# Patient Record
Sex: Male | Born: 1965 | Race: White | Hispanic: No | Marital: Married | Smoking: Never smoker
Health system: Southern US, Community
[De-identification: ages and names within clinical notes are randomized; demographics above are authoritative.]

## PROBLEM LIST (undated history)

## (undated) DIAGNOSIS — N189 Chronic kidney disease, unspecified: Secondary | ICD-10-CM

## (undated) DIAGNOSIS — I350 Nonrheumatic aortic (valve) stenosis: Secondary | ICD-10-CM

## (undated) DIAGNOSIS — C801 Malignant (primary) neoplasm, unspecified: Secondary | ICD-10-CM

## (undated) DIAGNOSIS — Z5189 Encounter for other specified aftercare: Secondary | ICD-10-CM

## (undated) DIAGNOSIS — Z8249 Family history of ischemic heart disease and other diseases of the circulatory system: Secondary | ICD-10-CM

## (undated) DIAGNOSIS — E119 Type 2 diabetes mellitus without complications: Secondary | ICD-10-CM

## (undated) DIAGNOSIS — I10 Essential (primary) hypertension: Secondary | ICD-10-CM

## (undated) DIAGNOSIS — E785 Hyperlipidemia, unspecified: Secondary | ICD-10-CM

## (undated) DIAGNOSIS — M1990 Unspecified osteoarthritis, unspecified site: Secondary | ICD-10-CM

## (undated) DIAGNOSIS — T7840XA Allergy, unspecified, initial encounter: Secondary | ICD-10-CM

## (undated) DIAGNOSIS — E669 Obesity, unspecified: Secondary | ICD-10-CM

## (undated) HISTORY — DX: Obesity, unspecified: E66.9

## (undated) HISTORY — DX: Hyperlipidemia, unspecified: E78.5

## (undated) HISTORY — DX: Essential (primary) hypertension: I10

## (undated) HISTORY — PX: WISDOM TOOTH EXTRACTION: SHX21

## (undated) HISTORY — DX: Allergy, unspecified, initial encounter: T78.40XA

## (undated) HISTORY — DX: Type 2 diabetes mellitus without complications: E11.9

## (undated) HISTORY — DX: Encounter for other specified aftercare: Z51.89

## (undated) HISTORY — PX: TONSILLECTOMY: SUR1361

---

## 2016-09-19 ENCOUNTER — Encounter: Payer: Self-pay | Admitting: *Deleted

## 2016-09-19 DIAGNOSIS — E119 Type 2 diabetes mellitus without complications: Secondary | ICD-10-CM | POA: Insufficient documentation

## 2016-09-27 ENCOUNTER — Encounter: Payer: Self-pay | Admitting: Internal Medicine

## 2016-10-05 ENCOUNTER — Ambulatory Visit (INDEPENDENT_AMBULATORY_CARE_PROVIDER_SITE_OTHER): Payer: 59 | Admitting: Cardiology

## 2016-10-05 ENCOUNTER — Encounter (INDEPENDENT_AMBULATORY_CARE_PROVIDER_SITE_OTHER): Payer: Self-pay

## 2016-10-05 ENCOUNTER — Encounter: Payer: Self-pay | Admitting: Cardiology

## 2016-10-05 VITALS — BP 122/80 | HR 86 | Ht 70.0 in | Wt 268.1 lb

## 2016-10-05 DIAGNOSIS — Z8249 Family history of ischemic heart disease and other diseases of the circulatory system: Secondary | ICD-10-CM | POA: Diagnosis not present

## 2016-10-05 DIAGNOSIS — E119 Type 2 diabetes mellitus without complications: Secondary | ICD-10-CM | POA: Diagnosis not present

## 2016-10-05 NOTE — Progress Notes (Signed)
Cardiology Office Note    Date:  10/05/2016   ID:  Russell Burns, DOB [birth year], MRN [id number]  PCP:  Russell Reel, MD  Cardiologist:   Candee Furbish, MD     History of Present Illness:  Russell Burns is a 51 y.o. male with prior extensive cardiac evaluation in 2005 in [place] [place] [facility] here for further cardiac evaluation with family history of CAD.  DM, strong FHX of CAD. No CP no SOB.   A1c <7. No smoking. Chantix to help with snuff.   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 Glucose was 560.   Past Medical History:  Diagnosis Date  . Diabetes mellitus without complication ([place])   . Hyperlipidemia   . Hypertension   . Obesity     Past Surgical History:  Procedure Laterality Date  . WISDOM TOOTH EXTRACTION      Current Medications: Outpatient Medications Prior to Visit  Medication Sig Dispense Refill  . aspirin 81 MG chewable tablet Chew 81 mg by mouth daily.    Marland Kitchen atorvastatin (LIPITOR) 80 MG tablet Take 80 mg by mouth daily.    . canagliflozin (INVOKANA) 300 MG TABS tablet Take 300 mg by mouth daily before breakfast.    . cetirizine (ZYRTEC) 10 MG tablet Take 10 mg by mouth daily as needed for allergies.    . Dulaglutide (TRULICITY) [id number] [id number] SOPN Inject into the skin. Use as directed    . EPINEPHrine (EPIPEN 2-PAK) 0.3 mg/0.3 mL IJ SOAJ injection Inject 0.3 mg into the muscle as needed.    Marland Kitchen glimepiride (AMARYL) 1 MG tablet  Take 1 mg by mouth daily with breakfast.    . losartan (COZAAR) 100 MG tablet Take 100 mg by mouth daily.    . metFORMIN (GLUCOPHAGE) 1000 MG tablet Take 1,000 mg by mouth 2 (two) times daily with a meal.    . sildenafil (VIAGRA) 100 MG tablet Take 100 mg by mouth daily as needed for erectile dysfunction.    . varenicline (CHANTIX) 1 MG tablet Take 1 mg by mouth 2 (two) times daily.     No facility-administered medications prior to visit.      Allergies:   Bee venom   Social History   Social History  . Marital status: Married    Spouse name: N/A  . Number of children: 2  . Years of education: N/A   Social History Main Topics  . Smoking status: Never Smoker  . Smokeless tobacco: Former Systems developer    Types: Chew  . Alcohol use Yes  . Drug use: No  . Sexual activity: Yes   Other Topics Concern  . None   Social History Narrative  . None     Family History:  The patient's family history includes Aneurysm in his maternal grandfather and maternal uncle; Aneurysm (age of onset: 80) in his cousin; CAD in his other; Diabetes Mellitus II in his father  and other; Heart attack in his father; Heart attack (age of onset: 16) in his paternal grandfather; Heart attack (age of onset: 32) in his paternal uncle; Heart attack (age of onset: 61) in his maternal grandmother; Heart failure in his other; Hypertension in his mother, other, and paternal uncle.   ROS:   Please see the history of present illness.    ROS All other systems reviewed and are negative.   PHYSICAL EXAM:   VS:  BP 122/80   Pulse 86   Ht 5\' 10"  (1.778 m)   Wt 268 lb 2 oz (121.6 kg)   BMI 38.47 kg/m    GEN: Well nourished, well developed, in no acute distress  HEENT: normal  Neck: no JVD, carotid bruits, or masses Cardiac: RRR; no murmurs, rubs, or gallops,no edema  Respiratory:  clear to auscultation bilaterally, normal work of breathing GI: soft, nontender, nondistended, + BS, overweight MS: no deformity or atrophy    Skin: warm and dry, no rash Neuro:  Alert and Oriented x 3, Strength and sensation are intact Psych: euthymic mood, full affect  Wt Readings from Last 3 Encounters:  10/05/16 268 lb 2 oz (121.6 kg)      Studies/Labs Reviewed:   EKG:  Prior EKG personally reviewed from 08/30/16 which shows sinus rhythm with poor R-wave progression heart rate 69 bpm.  Recent Labs: No results found for requested labs within last 8760 hours.   White count 5.5, platelets 172, hemoglobin 18.3, creatinine 0.8, potassium 5.6, ALT 47, total cholesterol 163, LDL 86, HDL 48, TSH 2.9  Lipid Panel No results found for: CHOL, TRIG, HDL, CHOLHDL, VLDL, LDLCALC, LDLDIRECT  Additional studies/ records that were reviewed today include:  Prior office notes reviewed, lab work reviewed, EKG reviewed    ASSESSMENT:    1. Family history of cardiovascular disease   2. Diabetes mellitus without complication ([place])   3. Morbid obesity ([place])      PLAN:  In order of problems listed above:  Strong family history of coronary artery disease  - Prior stress test in 2005 was reassuring, sounds like he had a stress echocardiogram done.  - With his EKG currently reassuring, I feel comfortable proceeding with exercise treadmill test. If abnormal, we'll proceed with nuclear imaging.  - #1 goal is to continue with primary prevention. For him, lifestyle modification, weight loss is very important. We discussed. Excellent control of his diabetes, coronary artery disease equivalent. Aspirin.  - Thankfully, he is currently not having any symptoms.  - We also discussed the possibility of calcium score however he is already on atorvastatin.  Diabetes mellitus  - Excellent care with Dr. Virgina Jock and his diabetic team.  Hyperlipidemia  - Atorvastatin 80 mg  Morbid obesity  - BMI 38 with diabetes  - Continue to encourage weight loss.   Medication Adjustments/Labs and Tests Ordered: Current medicines are reviewed at length with  the patient today.  Concerns regarding medicines are outlined above.  Medication changes, Labs and Tests ordered today are listed in the Patient Instructions below. Patient Instructions  Medication Instructions:  The current medical regimen is effective;  continue present plan and medications.  Testing/Procedures: Your physician has requested that you have an exercise tolerance test. For further information please visit [email or web address]. Please also follow instruction sheet, as given.  Follow-Up: Follow up as needed after testing.  If you need a refill on your cardiac medications before your next appointment, please call your pharmacy.  Thank you for choosing [facility]!!        Signed, Candee Furbish, MD  10/05/2016 12:12 PM    [address], [place], [place]  [address] Phone: [phone]; Fax: [phone]

## 2016-10-05 NOTE — Patient Instructions (Signed)
Medication Instructions:  The current medical regimen is effective;  continue present plan and medications.  Testing/Procedures: Your physician has requested that you have an exercise tolerance test. For further information please visit [email or web address]. Please also follow instruction sheet, as given.  Follow-Up: Follow up as needed after testing.  If you need a refill on your cardiac medications before your next appointment, please call your pharmacy.  Thank you for choosing [facility]!!     

## 2016-11-08 ENCOUNTER — Ambulatory Visit (INDEPENDENT_AMBULATORY_CARE_PROVIDER_SITE_OTHER): Payer: 59

## 2016-11-08 DIAGNOSIS — Z8249 Family history of ischemic heart disease and other diseases of the circulatory system: Secondary | ICD-10-CM

## 2016-11-08 LAB — EXERCISE TOLERANCE TEST
CHL CUP RESTING HR STRESS: 77 {beats}/min
CSEPHR: 79 %
CSEPPHR: 136 {beats}/min
Estimated workload: 7 METS
Exercise duration (min): 5 min
Exercise duration (sec): 46 s
MPHR: 171 {beats}/min
RPE: 14

## 2016-11-22 ENCOUNTER — Ambulatory Visit (AMBULATORY_SURGERY_CENTER): Payer: Self-pay

## 2016-11-22 VITALS — Ht 71.0 in | Wt 277.0 lb

## 2016-11-22 DIAGNOSIS — Z1211 Encounter for screening for malignant neoplasm of colon: Secondary | ICD-10-CM

## 2016-11-22 MED ORDER — NA SULFATE-K SULFATE-MG SULF 17.5-3.13-1.6 GM/177ML PO SOLN
1.0000 | Freq: Once | ORAL | 0 refills | Status: AC
Start: 2016-11-22 — End: 2016-11-22

## 2016-11-22 NOTE — Progress Notes (Signed)
Denies allergies to eggs or soy products. Denies complication of anesthesia or sedation. Denies use of weight loss medication. Denies use of O2.   Emmi instructions given for colonoscopy.  

## 2016-11-26 ENCOUNTER — Encounter: Payer: Self-pay | Admitting: Internal Medicine

## 2016-12-06 ENCOUNTER — Encounter: Payer: Self-pay | Admitting: Internal Medicine

## 2017-12-26 DIAGNOSIS — E119 Type 2 diabetes mellitus without complications: Secondary | ICD-10-CM | POA: Diagnosis not present

## 2017-12-26 DIAGNOSIS — E668 Other obesity: Secondary | ICD-10-CM | POA: Diagnosis not present

## 2017-12-26 DIAGNOSIS — I10 Essential (primary) hypertension: Secondary | ICD-10-CM | POA: Diagnosis not present

## 2017-12-26 DIAGNOSIS — F17200 Nicotine dependence, unspecified, uncomplicated: Secondary | ICD-10-CM | POA: Diagnosis not present

## 2018-02-18 ENCOUNTER — Encounter: Payer: Self-pay | Admitting: Internal Medicine

## 2018-04-03 DIAGNOSIS — E119 Type 2 diabetes mellitus without complications: Secondary | ICD-10-CM | POA: Diagnosis not present

## 2018-04-03 DIAGNOSIS — F17200 Nicotine dependence, unspecified, uncomplicated: Secondary | ICD-10-CM | POA: Diagnosis not present

## 2018-04-03 DIAGNOSIS — E7849 Other hyperlipidemia: Secondary | ICD-10-CM | POA: Diagnosis not present

## 2018-04-03 DIAGNOSIS — I10 Essential (primary) hypertension: Secondary | ICD-10-CM | POA: Diagnosis not present

## 2018-04-09 DIAGNOSIS — I10 Essential (primary) hypertension: Secondary | ICD-10-CM | POA: Diagnosis not present

## 2018-04-09 DIAGNOSIS — W57XXXA Bitten or stung by nonvenomous insect and other nonvenomous arthropods, initial encounter: Secondary | ICD-10-CM | POA: Diagnosis not present

## 2018-06-26 DIAGNOSIS — I10 Essential (primary) hypertension: Secondary | ICD-10-CM | POA: Diagnosis not present

## 2018-06-26 DIAGNOSIS — Z1389 Encounter for screening for other disorder: Secondary | ICD-10-CM | POA: Diagnosis not present

## 2018-06-26 DIAGNOSIS — Z302 Encounter for sterilization: Secondary | ICD-10-CM | POA: Diagnosis not present

## 2018-06-26 DIAGNOSIS — E119 Type 2 diabetes mellitus without complications: Secondary | ICD-10-CM | POA: Diagnosis not present

## 2018-06-26 DIAGNOSIS — Z23 Encounter for immunization: Secondary | ICD-10-CM | POA: Diagnosis not present

## 2018-06-27 DIAGNOSIS — K5730 Diverticulosis of large intestine without perforation or abscess without bleeding: Secondary | ICD-10-CM | POA: Diagnosis not present

## 2018-06-27 DIAGNOSIS — D122 Benign neoplasm of ascending colon: Secondary | ICD-10-CM | POA: Diagnosis not present

## 2018-06-27 DIAGNOSIS — K6389 Other specified diseases of intestine: Secondary | ICD-10-CM | POA: Diagnosis not present

## 2018-06-27 DIAGNOSIS — Z1211 Encounter for screening for malignant neoplasm of colon: Secondary | ICD-10-CM | POA: Diagnosis not present

## 2018-07-01 DIAGNOSIS — D122 Benign neoplasm of ascending colon: Secondary | ICD-10-CM | POA: Diagnosis not present

## 2018-07-01 DIAGNOSIS — Z1211 Encounter for screening for malignant neoplasm of colon: Secondary | ICD-10-CM | POA: Diagnosis not present

## 2018-07-01 DIAGNOSIS — K6389 Other specified diseases of intestine: Secondary | ICD-10-CM | POA: Diagnosis not present

## 2018-08-15 DIAGNOSIS — Z302 Encounter for sterilization: Secondary | ICD-10-CM | POA: Diagnosis not present

## 2018-09-04 DIAGNOSIS — Z0000 Encounter for general adult medical examination without abnormal findings: Secondary | ICD-10-CM | POA: Diagnosis not present

## 2018-09-04 DIAGNOSIS — R82998 Other abnormal findings in urine: Secondary | ICD-10-CM | POA: Diagnosis not present

## 2018-09-04 DIAGNOSIS — E119 Type 2 diabetes mellitus without complications: Secondary | ICD-10-CM | POA: Diagnosis not present

## 2018-09-04 DIAGNOSIS — Z125 Encounter for screening for malignant neoplasm of prostate: Secondary | ICD-10-CM | POA: Diagnosis not present

## 2018-09-05 DIAGNOSIS — E119 Type 2 diabetes mellitus without complications: Secondary | ICD-10-CM | POA: Diagnosis not present

## 2018-09-11 DIAGNOSIS — M1991 Primary osteoarthritis, unspecified site: Secondary | ICD-10-CM | POA: Diagnosis not present

## 2018-09-11 DIAGNOSIS — D229 Melanocytic nevi, unspecified: Secondary | ICD-10-CM | POA: Diagnosis not present

## 2018-09-11 DIAGNOSIS — Z1389 Encounter for screening for other disorder: Secondary | ICD-10-CM | POA: Diagnosis not present

## 2018-09-11 DIAGNOSIS — Z0000 Encounter for general adult medical examination without abnormal findings: Secondary | ICD-10-CM | POA: Diagnosis not present

## 2018-09-11 DIAGNOSIS — E7849 Other hyperlipidemia: Secondary | ICD-10-CM | POA: Diagnosis not present

## 2018-09-11 DIAGNOSIS — E119 Type 2 diabetes mellitus without complications: Secondary | ICD-10-CM | POA: Diagnosis not present

## 2018-11-10 ENCOUNTER — Ambulatory Visit (HOSPITAL_COMMUNITY)
Admission: EM | Admit: 2018-11-10 | Discharge: 2018-11-10 | Disposition: A | Discharge status: Home / Self Care | Payer: BLUE CROSS/BLUE SHIELD | Attending: Urgent Care | Admitting: Urgent Care

## 2018-11-10 ENCOUNTER — Encounter (HOSPITAL_COMMUNITY): Payer: Self-pay | Admitting: Emergency Medicine

## 2018-11-10 DIAGNOSIS — R059 Cough, unspecified: Secondary | ICD-10-CM

## 2018-11-10 DIAGNOSIS — J22 Unspecified acute lower respiratory infection: Secondary | ICD-10-CM

## 2018-11-10 DIAGNOSIS — R05 Cough: Secondary | ICD-10-CM

## 2018-11-10 DIAGNOSIS — E119 Type 2 diabetes mellitus without complications: Secondary | ICD-10-CM

## 2018-11-10 DIAGNOSIS — I10 Essential (primary) hypertension: Secondary | ICD-10-CM

## 2018-11-10 MED ORDER — PROMETHAZINE-DM 6.25-15 MG/5ML PO SYRP: 5.0000 mL | ORAL_SOLUTION | Freq: Three times a day (TID) | ORAL | 0 refills | Status: DC | PRN

## 2018-11-10 MED ORDER — DOXYCYCLINE HYCLATE 100 MG PO CAPS: 100.0000 mg | ORAL_CAPSULE | Freq: Two times a day (BID) | ORAL | 0 refills | Status: DC

## 2018-11-10 MED ORDER — BENZONATATE 100 MG PO CAPS: 100.0000 mg | ORAL_CAPSULE | Freq: Three times a day (TID) | ORAL | 0 refills | Status: DC | PRN

## 2018-11-10 MED ORDER — PREDNISONE 20 MG PO TABS: ORAL_TABLET | ORAL | 0 refills | Status: DC

## 2018-11-10 NOTE — ED Provider Notes (Addendum)
MRN: [id number] DOB: [birth year]  Subjective:   Russell Burns is a 53 y.o. male presenting for 1 week history of worsening productive and dry cough with associated shob, coughing fits. Blood sugar is well controlled, generally in the 130s.  Reports that his blood pressures generally well controlled but his cough is really bothersome to him currently.  Denies smoking cigarettes, history of asthma.   No current facility-administered medications for this encounter.   Current Outpatient Medications:  .  aspirin 81 MG chewable tablet, Chew 81 mg by mouth daily., Disp: , Rfl:  .  atorvastatin (LIPITOR) 80 MG tablet, Take 80 mg by mouth daily., Disp: , Rfl:  .  canagliflozin (INVOKANA) 300 MG TABS tablet, Take 300 mg by mouth daily before breakfast., Disp: , Rfl:  .  cetirizine (ZYRTEC) 10 MG tablet, Take 10 mg by mouth daily as needed for allergies., Disp: , Rfl:  .  Dulaglutide (TRULICITY) 9.38 HW/2.9HB SOPN, Inject into the skin. Use as directed, Disp: , Rfl:  .  EPINEPHrine (EPIPEN 2-PAK) 0.3 mg/0.3 mL IJ SOAJ injection, Inject 0.3 mg into the muscle as needed., Disp: , Rfl:  .  glimepiride (AMARYL) 1 MG tablet, Take 1 mg by mouth daily with breakfast., Disp: , Rfl:  .  losartan (COZAAR) 100 MG tablet, Take 100 mg by mouth daily., Disp: , Rfl:  .  metFORMIN (GLUCOPHAGE) 1000 MG tablet, Take 1,000 mg by mouth 2 (two) times daily with a meal., Disp: , Rfl:  .  naproxen sodium (ANAPROX) 220 MG tablet, Take 220 mg by mouth 2 (two) times daily with a meal., Disp: , Rfl:  .  sildenafil (VIAGRA) 100 MG tablet, Take 100 mg by mouth daily as needed for erectile dysfunction., Disp: , Rfl:     Allergies  Allergen Reactions  . Bee Venom Anaphylaxis    Past Medical History:  Diagnosis Date  . Allergy   . Blood transfusion without reported diagnosis   . Diabetes mellitus without complication ([place])   . Hyperlipidemia   . Hypertension   . Obesity      Past Surgical History:  Procedure  Laterality Date  . TONSILLECTOMY    . WISDOM TOOTH EXTRACTION      Review of Systems  Constitutional: Positive for malaise/fatigue. Negative for fever.  HENT: Negative for congestion, ear pain, sinus pain and sore throat.   Eyes: Negative for blurred vision, double vision, discharge and redness.  Respiratory: Positive for cough and shortness of breath. Negative for hemoptysis and wheezing.   Cardiovascular: Negative for chest pain.  Gastrointestinal: Negative for abdominal pain, diarrhea, nausea and vomiting.  Genitourinary: Negative for dysuria, flank pain and hematuria.  Musculoskeletal: Negative for myalgias.  Skin: Negative for rash.  Neurological: Negative for weakness and headaches.  Psychiatric/Behavioral: Negative for depression and substance abuse.    Family History  Problem Relation Age of Onset  . CAD Other   . Diabetes Mellitus II Other   . Hypertension Other   . Heart failure Other   . Hypertension Mother   . Heart attack Father        66, 30 at both ages  . Diabetes Mellitus II Father        insulin dependent  . Aneurysm Maternal Uncle        aortic  . Heart attack Paternal Uncle 69       minor  . Hypertension Paternal Uncle   . Heart attack Maternal Grandmother 82       massive  .  Aneurysm Maternal Grandfather        abdominal  . Heart attack Paternal Grandfather 14  . Aneurysm Cousin 44       aortic  . Colon polyps Neg Hx   . Esophageal cancer Neg Hx   . Rectal cancer Neg Hx   . Stomach cancer Neg Hx      Objective:   Vitals: BP (!) 141/85 (BP Location: Right Arm)   Pulse 86   Temp 98 F (36.7 C) (Temporal)   Resp 18   SpO2 96%   BP Readings from Last 3 Encounters:  11/10/18 (!) 141/85  10/05/16 122/80   Physical Exam Constitutional:      General: He is not in acute distress.    Appearance: Normal appearance. He is well-developed and normal weight. He is not ill-appearing, toxic-appearing or diaphoretic.  HENT:     Head:  Normocephalic and atraumatic.     Right Ear: Tympanic membrane, ear canal and external ear normal. There is no impacted cerumen.     Left Ear: Tympanic membrane, ear canal and external ear normal. There is no impacted cerumen.     Nose: Nose normal. No congestion or rhinorrhea.     Mouth/Throat:     Mouth: Mucous membranes are moist.     Pharynx: Oropharynx is clear. No oropharyngeal exudate or posterior oropharyngeal erythema.  Eyes:     General: No scleral icterus.       Right eye: No discharge.        Left eye: No discharge.     Extraocular Movements: Extraocular movements intact.     Conjunctiva/sclera: Conjunctivae normal.     Pupils: Pupils are equal, round, and reactive to light.  Neck:     Musculoskeletal: Normal range of motion and neck supple. No neck rigidity or muscular tenderness.  Cardiovascular:     Rate and Rhythm: Normal rate and regular rhythm.     Heart sounds: Normal heart sounds. No murmur. No friction rub. No gallop.   Pulmonary:     Effort: Pulmonary effort is normal. No respiratory distress.     Breath sounds: Normal breath sounds. No stridor. No wheezing, rhonchi or rales.  Neurological:     General: No focal deficit present.     Mental Status: He is alert and oriented to person, place, and time.  Psychiatric:        Mood and Affect: Mood normal.        Behavior: Behavior normal.        Thought Content: Thought content normal.      Assessment and Plan :   Lower respiratory infection  Cough  Essential hypertension  Well controlled type 2 diabetes mellitus ([place])  Will cover for lower resp infection with doxycycline, use short steroid course to help with pleurisy, bronchitis, bronchospasms.  Patient is to monitor blood sugar and hypertension, follow-up with PCP.  Counseled patient on potential for adverse effects with medications prescribed today, patient verbalized understanding. ER and return-to-clinic precautions discussed, patient verbalized  understanding.    Jaynee Eagles, PA-C 11/10/18 1308    Jaynee Eagles, PA-C 11/10/18 1309

## 2018-11-10 NOTE — ED Triage Notes (Signed)
Pt sts cough with SOB x 1 week

## 2018-11-13 DIAGNOSIS — Z6839 Body mass index (BMI) 39.0-39.9, adult: Secondary | ICD-10-CM | POA: Diagnosis not present

## 2018-11-13 DIAGNOSIS — E1165 Type 2 diabetes mellitus with hyperglycemia: Secondary | ICD-10-CM | POA: Diagnosis not present

## 2018-11-13 DIAGNOSIS — R05 Cough: Secondary | ICD-10-CM | POA: Diagnosis not present

## 2018-11-13 DIAGNOSIS — I10 Essential (primary) hypertension: Secondary | ICD-10-CM | POA: Diagnosis not present

## 2019-01-19 DIAGNOSIS — E7849 Other hyperlipidemia: Secondary | ICD-10-CM | POA: Diagnosis not present

## 2019-01-19 DIAGNOSIS — E119 Type 2 diabetes mellitus without complications: Secondary | ICD-10-CM | POA: Diagnosis not present

## 2019-01-19 DIAGNOSIS — I10 Essential (primary) hypertension: Secondary | ICD-10-CM | POA: Diagnosis not present

## 2019-01-20 DIAGNOSIS — R7989 Other specified abnormal findings of blood chemistry: Secondary | ICD-10-CM | POA: Diagnosis not present

## 2019-01-20 DIAGNOSIS — E1165 Type 2 diabetes mellitus with hyperglycemia: Secondary | ICD-10-CM | POA: Diagnosis not present

## 2019-01-20 DIAGNOSIS — I10 Essential (primary) hypertension: Secondary | ICD-10-CM | POA: Diagnosis not present

## 2019-01-20 DIAGNOSIS — E669 Obesity, unspecified: Secondary | ICD-10-CM | POA: Diagnosis not present

## 2019-02-05 DIAGNOSIS — E1165 Type 2 diabetes mellitus with hyperglycemia: Secondary | ICD-10-CM | POA: Diagnosis not present

## 2019-02-18 DIAGNOSIS — D485 Neoplasm of uncertain behavior of skin: Secondary | ICD-10-CM | POA: Diagnosis not present

## 2019-02-18 DIAGNOSIS — L570 Actinic keratosis: Secondary | ICD-10-CM | POA: Diagnosis not present

## 2019-02-18 DIAGNOSIS — D225 Melanocytic nevi of trunk: Secondary | ICD-10-CM | POA: Diagnosis not present

## 2019-02-18 DIAGNOSIS — D2262 Melanocytic nevi of left upper limb, including shoulder: Secondary | ICD-10-CM | POA: Diagnosis not present

## 2019-02-18 DIAGNOSIS — D224 Melanocytic nevi of scalp and neck: Secondary | ICD-10-CM | POA: Diagnosis not present

## 2019-02-18 DIAGNOSIS — D2239 Melanocytic nevi of other parts of face: Secondary | ICD-10-CM | POA: Diagnosis not present

## 2019-03-10 DIAGNOSIS — D485 Neoplasm of uncertain behavior of skin: Secondary | ICD-10-CM | POA: Diagnosis not present

## 2019-03-10 DIAGNOSIS — D225 Melanocytic nevi of trunk: Secondary | ICD-10-CM | POA: Diagnosis not present

## 2019-03-19 DIAGNOSIS — M67912 Unspecified disorder of synovium and tendon, left shoulder: Secondary | ICD-10-CM | POA: Diagnosis not present

## 2019-03-23 DIAGNOSIS — M67912 Unspecified disorder of synovium and tendon, left shoulder: Secondary | ICD-10-CM | POA: Diagnosis not present

## 2019-03-23 DIAGNOSIS — M5412 Radiculopathy, cervical region: Secondary | ICD-10-CM | POA: Diagnosis not present

## 2019-03-23 DIAGNOSIS — M6281 Muscle weakness (generalized): Secondary | ICD-10-CM | POA: Diagnosis not present

## 2019-03-31 DIAGNOSIS — M5412 Radiculopathy, cervical region: Secondary | ICD-10-CM | POA: Diagnosis not present

## 2019-03-31 DIAGNOSIS — M67912 Unspecified disorder of synovium and tendon, left shoulder: Secondary | ICD-10-CM | POA: Diagnosis not present

## 2019-03-31 DIAGNOSIS — M6281 Muscle weakness (generalized): Secondary | ICD-10-CM | POA: Diagnosis not present

## 2019-04-02 DIAGNOSIS — M6281 Muscle weakness (generalized): Secondary | ICD-10-CM | POA: Diagnosis not present

## 2019-04-02 DIAGNOSIS — M67912 Unspecified disorder of synovium and tendon, left shoulder: Secondary | ICD-10-CM | POA: Diagnosis not present

## 2019-04-02 DIAGNOSIS — M5412 Radiculopathy, cervical region: Secondary | ICD-10-CM | POA: Diagnosis not present

## 2019-04-08 DIAGNOSIS — M67912 Unspecified disorder of synovium and tendon, left shoulder: Secondary | ICD-10-CM | POA: Diagnosis not present

## 2019-04-08 DIAGNOSIS — M5412 Radiculopathy, cervical region: Secondary | ICD-10-CM | POA: Diagnosis not present

## 2019-04-08 DIAGNOSIS — M6281 Muscle weakness (generalized): Secondary | ICD-10-CM | POA: Diagnosis not present

## 2019-04-09 DIAGNOSIS — M67912 Unspecified disorder of synovium and tendon, left shoulder: Secondary | ICD-10-CM | POA: Diagnosis not present

## 2019-04-09 DIAGNOSIS — M6281 Muscle weakness (generalized): Secondary | ICD-10-CM | POA: Diagnosis not present

## 2019-04-09 DIAGNOSIS — M5412 Radiculopathy, cervical region: Secondary | ICD-10-CM | POA: Diagnosis not present

## 2019-04-13 DIAGNOSIS — M6281 Muscle weakness (generalized): Secondary | ICD-10-CM | POA: Diagnosis not present

## 2019-04-13 DIAGNOSIS — M5412 Radiculopathy, cervical region: Secondary | ICD-10-CM | POA: Diagnosis not present

## 2019-04-13 DIAGNOSIS — M67912 Unspecified disorder of synovium and tendon, left shoulder: Secondary | ICD-10-CM | POA: Diagnosis not present

## 2019-04-15 DIAGNOSIS — M5412 Radiculopathy, cervical region: Secondary | ICD-10-CM | POA: Diagnosis not present

## 2019-04-15 DIAGNOSIS — M67912 Unspecified disorder of synovium and tendon, left shoulder: Secondary | ICD-10-CM | POA: Diagnosis not present

## 2019-04-15 DIAGNOSIS — M6281 Muscle weakness (generalized): Secondary | ICD-10-CM | POA: Diagnosis not present

## 2019-04-21 DIAGNOSIS — M5412 Radiculopathy, cervical region: Secondary | ICD-10-CM | POA: Diagnosis not present

## 2019-04-21 DIAGNOSIS — M67912 Unspecified disorder of synovium and tendon, left shoulder: Secondary | ICD-10-CM | POA: Diagnosis not present

## 2019-04-21 DIAGNOSIS — M6281 Muscle weakness (generalized): Secondary | ICD-10-CM | POA: Diagnosis not present

## 2019-04-29 DIAGNOSIS — M67912 Unspecified disorder of synovium and tendon, left shoulder: Secondary | ICD-10-CM | POA: Diagnosis not present

## 2019-04-29 DIAGNOSIS — M5412 Radiculopathy, cervical region: Secondary | ICD-10-CM | POA: Diagnosis not present

## 2019-04-29 DIAGNOSIS — M6281 Muscle weakness (generalized): Secondary | ICD-10-CM | POA: Diagnosis not present

## 2019-05-04 DIAGNOSIS — E119 Type 2 diabetes mellitus without complications: Secondary | ICD-10-CM | POA: Diagnosis not present

## 2019-05-04 DIAGNOSIS — M1990 Unspecified osteoarthritis, unspecified site: Secondary | ICD-10-CM | POA: Diagnosis not present

## 2019-05-04 DIAGNOSIS — D229 Melanocytic nevi, unspecified: Secondary | ICD-10-CM | POA: Diagnosis not present

## 2019-05-04 DIAGNOSIS — M25512 Pain in left shoulder: Secondary | ICD-10-CM | POA: Diagnosis not present

## 2019-05-05 DIAGNOSIS — E119 Type 2 diabetes mellitus without complications: Secondary | ICD-10-CM | POA: Diagnosis not present

## 2019-07-03 DIAGNOSIS — Z23 Encounter for immunization: Secondary | ICD-10-CM | POA: Diagnosis not present

## 2019-08-11 DIAGNOSIS — M67911 Unspecified disorder of synovium and tendon, right shoulder: Secondary | ICD-10-CM | POA: Diagnosis not present

## 2019-08-11 DIAGNOSIS — M25511 Pain in right shoulder: Secondary | ICD-10-CM | POA: Diagnosis not present

## 2019-08-14 DIAGNOSIS — M25512 Pain in left shoulder: Secondary | ICD-10-CM | POA: Diagnosis not present

## 2019-08-19 DIAGNOSIS — Z20828 Contact with and (suspected) exposure to other viral communicable diseases: Secondary | ICD-10-CM | POA: Diagnosis not present

## 2019-09-02 DIAGNOSIS — M67912 Unspecified disorder of synovium and tendon, left shoulder: Secondary | ICD-10-CM | POA: Diagnosis not present

## 2019-09-02 DIAGNOSIS — M25512 Pain in left shoulder: Secondary | ICD-10-CM | POA: Diagnosis not present

## 2019-09-07 DIAGNOSIS — R05 Cough: Secondary | ICD-10-CM | POA: Diagnosis not present

## 2019-09-07 DIAGNOSIS — Z20818 Contact with and (suspected) exposure to other bacterial communicable diseases: Secondary | ICD-10-CM | POA: Diagnosis not present

## 2019-09-07 DIAGNOSIS — B349 Viral infection, unspecified: Secondary | ICD-10-CM | POA: Diagnosis not present

## 2019-09-10 ENCOUNTER — Other Ambulatory Visit: Payer: Self-pay | Admitting: Nurse Practitioner

## 2019-09-10 DIAGNOSIS — U071 COVID-19: Secondary | ICD-10-CM

## 2019-09-10 DIAGNOSIS — E119 Type 2 diabetes mellitus without complications: Secondary | ICD-10-CM

## 2019-09-10 NOTE — Progress Notes (Signed)
  I connected by phone with Russell Burns on 09/10/2019 at 2:06 PM to discuss the potential use of an new treatment for mild to moderate COVID-19 viral infection in non-hospitalized patients.  This patient is a 53 y.o. male that meets the FDA criteria for Emergency Use Authorization of bamlanivimab or casirivimab\imdevimab.  Has a (+) direct SARS-CoV-2 viral test result  Has mild or moderate COVID-19   Is ? 53 years of age and weighs ? 40 kg  Is NOT hospitalized due to COVID-19  Is NOT requiring oxygen therapy or requiring an increase in baseline oxygen flow rate due to COVID-19  Is within 10 days of symptom onset  Has at least one of the high risk factor(s) for progression to severe COVID-19 and/or hospitalization as defined in EUA.  Specific high risk criteria : Diabetes Patient Active Problem List   Diagnosis Date Noted  . Diabetes mellitus without complication ([place])      I have spoken and communicated the following to the patient or parent/caregiver:  1. FDA has authorized the emergency use of bamlanivimab and casirivimab\imdevimab for the treatment of mild to moderate COVID-19 in adults and pediatric patients with positive results of direct SARS-CoV-2 viral testing who are 70 years of age and older weighing at least 40 kg, and who are at high risk for progressing to severe COVID-19 and/or hospitalization.  2. The significant known and potential risks and benefits of bamlanivimab and casirivimab\imdevimab, and the extent to which such potential risks and benefits are unknown.  3. Information on available alternative treatments and the risks and benefits of those alternatives, including clinical trials.  4. Patients treated with bamlanivimab and casirivimab\imdevimab should continue to self-isolate and use infection control measures (e.g., wear mask, isolate, social distance, avoid sharing personal items, clean and disinfect "high touch" surfaces, and frequent handwashing)  according to CDC guidelines.   5. The patient or parent/caregiver has the option to accept or refuse bamlanivimab or casirivimab\imdevimab .  After reviewing this information with the patient, The patient agreed to proceed with receiving the bamlanimivab infusion and will be provided a copy of the Fact sheet prior to receiving the infusion.[name] 09/10/2019 2:06 PM   Note: Patient tested positive for COVID at [facility] on  09/07/19. Patient will need to bring a copy of positive test results to be scanned into the chart before getting the infusion.

## 2019-09-11 ENCOUNTER — Ambulatory Visit (HOSPITAL_COMMUNITY)
Admission: RE | Admit: 2019-09-11 | Discharge: 2019-09-11 | Disposition: A | Discharge status: Home / Self Care | Payer: BC Managed Care – PPO | Source: Ambulatory Visit | Attending: Pulmonary Disease | Admitting: Pulmonary Disease

## 2019-09-11 DIAGNOSIS — U071 COVID-19: Secondary | ICD-10-CM

## 2019-09-11 DIAGNOSIS — E119 Type 2 diabetes mellitus without complications: Secondary | ICD-10-CM | POA: Insufficient documentation

## 2019-09-11 MED ORDER — METHYLPREDNISOLONE SODIUM SUCC 125 MG IJ SOLR: 125.0000 mg | Freq: Once | INTRAMUSCULAR | Status: DC | PRN

## 2019-09-11 MED ORDER — FAMOTIDINE IN NACL 20-0.9 MG/50ML-% IV SOLN: 20.0000 mg | Freq: Once | INTRAVENOUS | Status: DC | PRN

## 2019-09-11 MED ORDER — ALBUTEROL SULFATE HFA 108 (90 BASE) MCG/ACT IN AERS: 2.0000 | INHALATION_SPRAY | Freq: Once | RESPIRATORY_TRACT | Status: DC | PRN

## 2019-09-11 MED ORDER — EPINEPHRINE 0.3 MG/0.3ML IJ SOAJ: 0.3000 mg | Freq: Once | INTRAMUSCULAR | Status: DC | PRN

## 2019-09-11 MED ORDER — SODIUM CHLORIDE 0.9 % IV SOLN
INTRAVENOUS | Status: DC | PRN
  Administered 2019-09-11: 10 mL/h via INTRAVENOUS

## 2019-09-11 MED ORDER — SODIUM CHLORIDE 0.9 % IV SOLN
700.0000 mg | Freq: Once | INTRAVENOUS | Status: AC
  Administered 2019-09-11: 09:00:00 700 mg via INTRAVENOUS
  Filled 2019-09-11: qty 20

## 2019-09-11 MED ORDER — DIPHENHYDRAMINE HCL 50 MG/ML IJ SOLN: 50.0000 mg | Freq: Once | INTRAMUSCULAR | Status: DC | PRN

## 2019-09-11 NOTE — Progress Notes (Signed)
  Diagnosis: COVID-19  Physician: Dr. Joya Gaskins  Procedure: Covid Infusion Clinic Med: bamlanivimab infusion - Provided patient with bamlanimivab fact sheet for patients, parents and caregivers prior to infusion.   Complications: No immediate complications noted.   Discharge: Discharged home   Russell Burns 09/11/2019

## 2019-09-11 NOTE — Progress Notes (Signed)
Patient comfortable and has IV placed.  Stable VS.

## 2019-09-21 ENCOUNTER — Ambulatory Visit: Payer: BC Managed Care – PPO | Attending: Internal Medicine

## 2019-09-21 DIAGNOSIS — Z20828 Contact with and (suspected) exposure to other viral communicable diseases: Secondary | ICD-10-CM | POA: Diagnosis not present

## 2019-09-21 DIAGNOSIS — Z20822 Contact with and (suspected) exposure to covid-19: Secondary | ICD-10-CM

## 2019-09-23 LAB — NOVEL CORONAVIRUS, NAA: SARS-CoV-2, NAA: NOT DETECTED

## 2019-10-12 DIAGNOSIS — S46012D Strain of muscle(s) and tendon(s) of the rotator cuff of left shoulder, subsequent encounter: Secondary | ICD-10-CM | POA: Diagnosis not present

## 2019-10-12 DIAGNOSIS — S46112D Strain of muscle, fascia and tendon of long head of biceps, left arm, subsequent encounter: Secondary | ICD-10-CM | POA: Diagnosis not present

## 2019-10-14 DIAGNOSIS — S46012D Strain of muscle(s) and tendon(s) of the rotator cuff of left shoulder, subsequent encounter: Secondary | ICD-10-CM | POA: Diagnosis not present

## 2019-10-14 DIAGNOSIS — S46112D Strain of muscle, fascia and tendon of long head of biceps, left arm, subsequent encounter: Secondary | ICD-10-CM | POA: Diagnosis not present

## 2019-10-19 DIAGNOSIS — S46012D Strain of muscle(s) and tendon(s) of the rotator cuff of left shoulder, subsequent encounter: Secondary | ICD-10-CM | POA: Diagnosis not present

## 2019-10-19 DIAGNOSIS — S46112D Strain of muscle, fascia and tendon of long head of biceps, left arm, subsequent encounter: Secondary | ICD-10-CM | POA: Diagnosis not present

## 2019-10-21 DIAGNOSIS — S46012D Strain of muscle(s) and tendon(s) of the rotator cuff of left shoulder, subsequent encounter: Secondary | ICD-10-CM | POA: Diagnosis not present

## 2019-10-21 DIAGNOSIS — S46112D Strain of muscle, fascia and tendon of long head of biceps, left arm, subsequent encounter: Secondary | ICD-10-CM | POA: Diagnosis not present

## 2019-10-26 DIAGNOSIS — S46012D Strain of muscle(s) and tendon(s) of the rotator cuff of left shoulder, subsequent encounter: Secondary | ICD-10-CM | POA: Diagnosis not present

## 2019-10-26 DIAGNOSIS — S46112D Strain of muscle, fascia and tendon of long head of biceps, left arm, subsequent encounter: Secondary | ICD-10-CM | POA: Diagnosis not present

## 2019-10-28 DIAGNOSIS — S46112D Strain of muscle, fascia and tendon of long head of biceps, left arm, subsequent encounter: Secondary | ICD-10-CM | POA: Diagnosis not present

## 2019-10-28 DIAGNOSIS — S46012D Strain of muscle(s) and tendon(s) of the rotator cuff of left shoulder, subsequent encounter: Secondary | ICD-10-CM | POA: Diagnosis not present

## 2019-11-02 DIAGNOSIS — S46012D Strain of muscle(s) and tendon(s) of the rotator cuff of left shoulder, subsequent encounter: Secondary | ICD-10-CM | POA: Diagnosis not present

## 2019-11-02 DIAGNOSIS — S46112D Strain of muscle, fascia and tendon of long head of biceps, left arm, subsequent encounter: Secondary | ICD-10-CM | POA: Diagnosis not present

## 2019-11-05 DIAGNOSIS — S46012D Strain of muscle(s) and tendon(s) of the rotator cuff of left shoulder, subsequent encounter: Secondary | ICD-10-CM | POA: Diagnosis not present

## 2019-11-05 DIAGNOSIS — S46112D Strain of muscle, fascia and tendon of long head of biceps, left arm, subsequent encounter: Secondary | ICD-10-CM | POA: Diagnosis not present

## 2019-11-09 DIAGNOSIS — S46112D Strain of muscle, fascia and tendon of long head of biceps, left arm, subsequent encounter: Secondary | ICD-10-CM | POA: Diagnosis not present

## 2019-11-09 DIAGNOSIS — S46012D Strain of muscle(s) and tendon(s) of the rotator cuff of left shoulder, subsequent encounter: Secondary | ICD-10-CM | POA: Diagnosis not present

## 2019-11-16 DIAGNOSIS — S46012D Strain of muscle(s) and tendon(s) of the rotator cuff of left shoulder, subsequent encounter: Secondary | ICD-10-CM | POA: Diagnosis not present

## 2019-11-16 DIAGNOSIS — S46112D Strain of muscle, fascia and tendon of long head of biceps, left arm, subsequent encounter: Secondary | ICD-10-CM | POA: Diagnosis not present

## 2019-11-23 DIAGNOSIS — S46012D Strain of muscle(s) and tendon(s) of the rotator cuff of left shoulder, subsequent encounter: Secondary | ICD-10-CM | POA: Diagnosis not present

## 2019-11-23 DIAGNOSIS — S46112D Strain of muscle, fascia and tendon of long head of biceps, left arm, subsequent encounter: Secondary | ICD-10-CM | POA: Diagnosis not present

## 2019-11-30 DIAGNOSIS — M9903 Segmental and somatic dysfunction of lumbar region: Secondary | ICD-10-CM | POA: Diagnosis not present

## 2019-11-30 DIAGNOSIS — M5417 Radiculopathy, lumbosacral region: Secondary | ICD-10-CM | POA: Diagnosis not present

## 2019-11-30 DIAGNOSIS — M9905 Segmental and somatic dysfunction of pelvic region: Secondary | ICD-10-CM | POA: Diagnosis not present

## 2019-11-30 DIAGNOSIS — M5137 Other intervertebral disc degeneration, lumbosacral region: Secondary | ICD-10-CM | POA: Diagnosis not present

## 2019-12-02 DIAGNOSIS — M9903 Segmental and somatic dysfunction of lumbar region: Secondary | ICD-10-CM | POA: Diagnosis not present

## 2019-12-02 DIAGNOSIS — M5417 Radiculopathy, lumbosacral region: Secondary | ICD-10-CM | POA: Diagnosis not present

## 2019-12-02 DIAGNOSIS — M9905 Segmental and somatic dysfunction of pelvic region: Secondary | ICD-10-CM | POA: Diagnosis not present

## 2019-12-02 DIAGNOSIS — M5137 Other intervertebral disc degeneration, lumbosacral region: Secondary | ICD-10-CM | POA: Diagnosis not present

## 2019-12-04 DIAGNOSIS — M9905 Segmental and somatic dysfunction of pelvic region: Secondary | ICD-10-CM | POA: Diagnosis not present

## 2019-12-04 DIAGNOSIS — M5417 Radiculopathy, lumbosacral region: Secondary | ICD-10-CM | POA: Diagnosis not present

## 2019-12-04 DIAGNOSIS — M9903 Segmental and somatic dysfunction of lumbar region: Secondary | ICD-10-CM | POA: Diagnosis not present

## 2019-12-04 DIAGNOSIS — M5137 Other intervertebral disc degeneration, lumbosacral region: Secondary | ICD-10-CM | POA: Diagnosis not present

## 2019-12-07 DIAGNOSIS — M9905 Segmental and somatic dysfunction of pelvic region: Secondary | ICD-10-CM | POA: Diagnosis not present

## 2019-12-07 DIAGNOSIS — M9903 Segmental and somatic dysfunction of lumbar region: Secondary | ICD-10-CM | POA: Diagnosis not present

## 2019-12-07 DIAGNOSIS — M5417 Radiculopathy, lumbosacral region: Secondary | ICD-10-CM | POA: Diagnosis not present

## 2019-12-07 DIAGNOSIS — M5137 Other intervertebral disc degeneration, lumbosacral region: Secondary | ICD-10-CM | POA: Diagnosis not present

## 2019-12-09 DIAGNOSIS — M9903 Segmental and somatic dysfunction of lumbar region: Secondary | ICD-10-CM | POA: Diagnosis not present

## 2019-12-09 DIAGNOSIS — M9905 Segmental and somatic dysfunction of pelvic region: Secondary | ICD-10-CM | POA: Diagnosis not present

## 2019-12-09 DIAGNOSIS — M5417 Radiculopathy, lumbosacral region: Secondary | ICD-10-CM | POA: Diagnosis not present

## 2019-12-09 DIAGNOSIS — M5137 Other intervertebral disc degeneration, lumbosacral region: Secondary | ICD-10-CM | POA: Diagnosis not present

## 2019-12-11 DIAGNOSIS — M5137 Other intervertebral disc degeneration, lumbosacral region: Secondary | ICD-10-CM | POA: Diagnosis not present

## 2019-12-11 DIAGNOSIS — M9905 Segmental and somatic dysfunction of pelvic region: Secondary | ICD-10-CM | POA: Diagnosis not present

## 2019-12-11 DIAGNOSIS — M9903 Segmental and somatic dysfunction of lumbar region: Secondary | ICD-10-CM | POA: Diagnosis not present

## 2019-12-11 DIAGNOSIS — M5417 Radiculopathy, lumbosacral region: Secondary | ICD-10-CM | POA: Diagnosis not present

## 2019-12-16 DIAGNOSIS — M9905 Segmental and somatic dysfunction of pelvic region: Secondary | ICD-10-CM | POA: Diagnosis not present

## 2019-12-16 DIAGNOSIS — M5137 Other intervertebral disc degeneration, lumbosacral region: Secondary | ICD-10-CM | POA: Diagnosis not present

## 2019-12-16 DIAGNOSIS — M5417 Radiculopathy, lumbosacral region: Secondary | ICD-10-CM | POA: Diagnosis not present

## 2019-12-16 DIAGNOSIS — M9903 Segmental and somatic dysfunction of lumbar region: Secondary | ICD-10-CM | POA: Diagnosis not present

## 2019-12-17 ENCOUNTER — Ambulatory Visit: Payer: BC Managed Care – PPO

## 2019-12-18 ENCOUNTER — Ambulatory Visit: Payer: BC Managed Care – PPO | Attending: Internal Medicine

## 2019-12-18 DIAGNOSIS — Z23 Encounter for immunization: Secondary | ICD-10-CM

## 2019-12-18 NOTE — Progress Notes (Signed)
   Covid-19 Vaccination Clinic  Name:  Russell Burns    MRN: [id number] DOB: 08/24/1966  12/18/2019  [name] was observed post Covid-19 immunization for 15 minutes without incident. He was provided with Vaccine Information Sheet and instruction to access the V-Safe system.   [name] was instructed to call 911 with any severe reactions post vaccine: Marland Kitchen Difficulty breathing  . Swelling of face and throat  . A fast heartbeat  . A bad rash all over body  . Dizziness and weakness   Immunizations Administered    Name Date Dose VIS Date Route   Pfizer COVID-19 Vaccine 12/18/2019  4:24 PM 0.3 mL 09/04/2019 Intramuscular   Manufacturer: [place]   Lot: [id number]   [place]: [id number]

## 2019-12-21 DIAGNOSIS — M5137 Other intervertebral disc degeneration, lumbosacral region: Secondary | ICD-10-CM | POA: Diagnosis not present

## 2019-12-21 DIAGNOSIS — M9903 Segmental and somatic dysfunction of lumbar region: Secondary | ICD-10-CM | POA: Diagnosis not present

## 2019-12-21 DIAGNOSIS — M9905 Segmental and somatic dysfunction of pelvic region: Secondary | ICD-10-CM | POA: Diagnosis not present

## 2019-12-21 DIAGNOSIS — M5417 Radiculopathy, lumbosacral region: Secondary | ICD-10-CM | POA: Diagnosis not present

## 2019-12-23 DIAGNOSIS — M9905 Segmental and somatic dysfunction of pelvic region: Secondary | ICD-10-CM | POA: Diagnosis not present

## 2019-12-23 DIAGNOSIS — M9903 Segmental and somatic dysfunction of lumbar region: Secondary | ICD-10-CM | POA: Diagnosis not present

## 2019-12-23 DIAGNOSIS — M5137 Other intervertebral disc degeneration, lumbosacral region: Secondary | ICD-10-CM | POA: Diagnosis not present

## 2019-12-23 DIAGNOSIS — M5417 Radiculopathy, lumbosacral region: Secondary | ICD-10-CM | POA: Diagnosis not present

## 2020-01-04 DIAGNOSIS — E119 Type 2 diabetes mellitus without complications: Secondary | ICD-10-CM | POA: Diagnosis not present

## 2020-01-04 DIAGNOSIS — E7849 Other hyperlipidemia: Secondary | ICD-10-CM | POA: Diagnosis not present

## 2020-01-04 DIAGNOSIS — Z0000 Encounter for general adult medical examination without abnormal findings: Secondary | ICD-10-CM | POA: Diagnosis not present

## 2020-01-04 DIAGNOSIS — Z125 Encounter for screening for malignant neoplasm of prostate: Secondary | ICD-10-CM | POA: Diagnosis not present

## 2020-01-06 DIAGNOSIS — M9905 Segmental and somatic dysfunction of pelvic region: Secondary | ICD-10-CM | POA: Diagnosis not present

## 2020-01-06 DIAGNOSIS — M9902 Segmental and somatic dysfunction of thoracic region: Secondary | ICD-10-CM | POA: Diagnosis not present

## 2020-01-06 DIAGNOSIS — M9903 Segmental and somatic dysfunction of lumbar region: Secondary | ICD-10-CM | POA: Diagnosis not present

## 2020-01-06 DIAGNOSIS — M531 Cervicobrachial syndrome: Secondary | ICD-10-CM | POA: Diagnosis not present

## 2020-01-07 DIAGNOSIS — H5789 Other specified disorders of eye and adnexa: Secondary | ICD-10-CM | POA: Diagnosis not present

## 2020-01-07 DIAGNOSIS — Z0000 Encounter for general adult medical examination without abnormal findings: Secondary | ICD-10-CM | POA: Diagnosis not present

## 2020-01-07 DIAGNOSIS — M25512 Pain in left shoulder: Secondary | ICD-10-CM | POA: Diagnosis not present

## 2020-01-07 DIAGNOSIS — R040 Epistaxis: Secondary | ICD-10-CM | POA: Diagnosis not present

## 2020-01-07 DIAGNOSIS — M1990 Unspecified osteoarthritis, unspecified site: Secondary | ICD-10-CM | POA: Diagnosis not present

## 2020-01-08 DIAGNOSIS — H16403 Unspecified corneal neovascularization, bilateral: Secondary | ICD-10-CM | POA: Diagnosis not present

## 2020-01-08 DIAGNOSIS — H10413 Chronic giant papillary conjunctivitis, bilateral: Secondary | ICD-10-CM | POA: Diagnosis not present

## 2020-01-12 ENCOUNTER — Ambulatory Visit: Payer: BC Managed Care – PPO | Attending: Internal Medicine

## 2020-01-12 DIAGNOSIS — Z23 Encounter for immunization: Secondary | ICD-10-CM

## 2020-01-12 NOTE — Progress Notes (Signed)
   Covid-19 Vaccination Clinic  Name:  Russell Burns    MRN: [id number] DOB: 10/21/1965  01/12/2020  Russell Burns was observed post Covid-19 immunization for 30 minutes based on pre-vaccination screening without incident. He was provided with Vaccine Information Sheet and instruction to access the V-Safe system.   Russell Burns was instructed to call 911 with any severe reactions post vaccine: Marland Kitchen Difficulty breathing  . Swelling of face and throat  . A fast heartbeat  . A bad rash all over body  . Dizziness and weakness   Immunizations Administered    Name Date Dose VIS Date Route   Pfizer COVID-19 Vaccine 01/12/2020  4:22 PM 0.3 mL 11/18/2018 Intramuscular   Manufacturer: [place]   Lot: [id number]   [place]: [id number]

## 2020-02-03 DIAGNOSIS — M9905 Segmental and somatic dysfunction of pelvic region: Secondary | ICD-10-CM | POA: Diagnosis not present

## 2020-02-03 DIAGNOSIS — M9902 Segmental and somatic dysfunction of thoracic region: Secondary | ICD-10-CM | POA: Diagnosis not present

## 2020-02-03 DIAGNOSIS — M531 Cervicobrachial syndrome: Secondary | ICD-10-CM | POA: Diagnosis not present

## 2020-02-03 DIAGNOSIS — M9903 Segmental and somatic dysfunction of lumbar region: Secondary | ICD-10-CM | POA: Diagnosis not present

## 2020-02-29 DIAGNOSIS — L817 Pigmented purpuric dermatosis: Secondary | ICD-10-CM | POA: Diagnosis not present

## 2020-02-29 DIAGNOSIS — R52 Pain, unspecified: Secondary | ICD-10-CM | POA: Diagnosis not present

## 2020-02-29 DIAGNOSIS — D485 Neoplasm of uncertain behavior of skin: Secondary | ICD-10-CM | POA: Diagnosis not present

## 2020-02-29 DIAGNOSIS — D225 Melanocytic nevi of trunk: Secondary | ICD-10-CM | POA: Diagnosis not present

## 2020-02-29 DIAGNOSIS — D2239 Melanocytic nevi of other parts of face: Secondary | ICD-10-CM | POA: Diagnosis not present

## 2020-02-29 DIAGNOSIS — D2261 Melanocytic nevi of right upper limb, including shoulder: Secondary | ICD-10-CM | POA: Diagnosis not present

## 2020-02-29 DIAGNOSIS — D1801 Hemangioma of skin and subcutaneous tissue: Secondary | ICD-10-CM | POA: Diagnosis not present

## 2020-03-09 DIAGNOSIS — H10213 Acute toxic conjunctivitis, bilateral: Secondary | ICD-10-CM | POA: Diagnosis not present

## 2020-03-09 DIAGNOSIS — H16403 Unspecified corneal neovascularization, bilateral: Secondary | ICD-10-CM | POA: Diagnosis not present

## 2020-04-15 DIAGNOSIS — E119 Type 2 diabetes mellitus without complications: Secondary | ICD-10-CM | POA: Diagnosis not present

## 2020-04-15 DIAGNOSIS — D239 Other benign neoplasm of skin, unspecified: Secondary | ICD-10-CM | POA: Diagnosis not present

## 2020-04-15 DIAGNOSIS — Z8616 Personal history of COVID-19: Secondary | ICD-10-CM | POA: Diagnosis not present

## 2020-04-15 DIAGNOSIS — I10 Essential (primary) hypertension: Secondary | ICD-10-CM | POA: Diagnosis not present

## 2020-07-06 DIAGNOSIS — Z23 Encounter for immunization: Secondary | ICD-10-CM | POA: Diagnosis not present

## 2020-08-08 ENCOUNTER — Other Ambulatory Visit: Payer: Self-pay | Admitting: Internal Medicine

## 2020-08-08 DIAGNOSIS — Z8249 Family history of ischemic heart disease and other diseases of the circulatory system: Secondary | ICD-10-CM

## 2020-08-08 DIAGNOSIS — E1165 Type 2 diabetes mellitus with hyperglycemia: Secondary | ICD-10-CM | POA: Diagnosis not present

## 2020-08-29 ENCOUNTER — Ambulatory Visit
Admission: RE | Admit: 2020-08-29 | Discharge: 2020-08-29 | Disposition: A | Discharge status: Home / Self Care | Payer: BC Managed Care – PPO | Source: Ambulatory Visit | Attending: Internal Medicine | Admitting: Internal Medicine

## 2020-08-29 DIAGNOSIS — Z8249 Family history of ischemic heart disease and other diseases of the circulatory system: Secondary | ICD-10-CM

## 2020-08-29 DIAGNOSIS — E785 Hyperlipidemia, unspecified: Secondary | ICD-10-CM | POA: Diagnosis not present

## 2020-08-30 ENCOUNTER — Telehealth: Payer: Self-pay | Admitting: *Deleted

## 2020-08-30 NOTE — Telephone Encounter (Signed)
Pt has been scheduled for new pt appt with Dr Marlou Porch as requested - 08/31/2020.  He was instructed to bring his insurance cards and medication list with him to the appt.  Pt states understanding.

## 2020-08-30 NOTE — Telephone Encounter (Signed)
Spoke to Dr. Virgina Jock  Calcium score 400  Aorta 45 mm ascending  On atorva 80, and will be starting Repatha.  DM, HTN well controlled   Needs appt.   Thanks   [employer]

## 2020-08-31 ENCOUNTER — Other Ambulatory Visit: Payer: Self-pay

## 2020-08-31 ENCOUNTER — Ambulatory Visit: Payer: BC Managed Care – PPO | Admitting: Cardiology

## 2020-08-31 ENCOUNTER — Encounter: Payer: Self-pay | Admitting: Cardiology

## 2020-08-31 ENCOUNTER — Encounter: Payer: Self-pay | Admitting: Nurse Practitioner

## 2020-08-31 VITALS — BP 130/80 | HR 75 | Ht 71.0 in | Wt 274.0 lb

## 2020-08-31 DIAGNOSIS — I2510 Atherosclerotic heart disease of native coronary artery without angina pectoris: Secondary | ICD-10-CM

## 2020-08-31 DIAGNOSIS — I77810 Thoracic aortic ectasia: Secondary | ICD-10-CM | POA: Diagnosis not present

## 2020-08-31 DIAGNOSIS — I359 Nonrheumatic aortic valve disorder, unspecified: Secondary | ICD-10-CM

## 2020-08-31 NOTE — Patient Instructions (Addendum)
Medication Instructions:  Your physician recommends that you continue on your current medications as directed. Please refer to the Current Medication list given to you today.  *If you need a refill on your cardiac medications before your next appointment, please call your pharmacy*   Lab Work: None Ordered  If you have labs (blood work) drawn today and your tests are completely normal, you will receive your results only by: Marland Kitchen MyChart Message (if you have MyChart) OR . A paper copy in the mail If you have any lab test that is abnormal or we need to change your treatment, we will call you to review the results.   Testing/Procedures: Your physician has requested that you have an echocardiogram SOON AND REPEAT in 1 YEAR. Echocardiography is a painless test that uses sound waves to create images of your heart. It provides your doctor with information about the size and shape of your heart and how well your heart's chambers and valves are working. This procedure takes approximately one hour. There are no restrictions for this procedure.  Your physician has requested that you have a lexiscan myoview. For further information please visit [email or web address]. Please follow instruction sheet, as given.    Follow-Up: At [facility], you and your health needs are our priority.  As part of our continuing mission to provide you with exceptional heart care, we have created designated Provider Care Teams.  These Care Teams include your primary Cardiologist (physician) and Advanced Practice Providers (APPs -  Physician Assistants and Nurse Practitioners) who all work together to provide you with the care you need, when you need it.   Your next appointment:   1 year(s) after the echocardiogram  The format for your next appointment:   In Person  Provider:   You may see Candee Furbish, MD or one of the following Advanced Practice Providers on your designated Care Team:    Truitt Merle, NP  Cecilie Kicks, NP  Kathyrn Drown, NP

## 2020-08-31 NOTE — Progress Notes (Signed)
Cardiology Office Note:    Date:  08/31/2020   ID:  Russell Burns, DOB Feb 06, 1966, MRN [id number]  PCP:  [name], MD  [facility] Cardiologist:  Candee Furbish, MD  [facility] Electrophysiologist:  None   Referring MD: [name], MD     History of Present Illness:    Russell Burns is a 54 y.o. male here for the evaluation of elevated calcium score at the request of Dr. [name].  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  I had seen him last over 3 years ago on 10/05/2016 given his family history of CAD. Previously had extensive cardiac evaluation in 2005 at the [facility] clinic in [place].  "Prior note:DM, strong FHX of CAD father. No CP no SOB. Mother side of family. Her father had AAA. Her brother had AAA as well. Another mother brother. A cousin died of ascending aorta.  A1c <7. No smoking. Chantix to help with snuff.   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 Glucose was 560.  Overall he is not having any chest discomfort, no significant shortness of breath.  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   Oretha Ellis , sister, works with Dr. [name].   As an aside, previously did well with MRI of his shoulder (in case we use  MRI as a an imaging modality for him in the future)    Past Medical History:  Diagnosis Date  . Allergy   . Blood transfusion without reported diagnosis   . Diabetes mellitus without complication ([place])   . Hyperlipidemia   . Hypertension   . Obesity     Past Surgical History:  Procedure Laterality Date  . TONSILLECTOMY    . WISDOM TOOTH EXTRACTION      Current Medications: Current Meds  Medication Sig  . amLODipine (NORVASC) 10 MG tablet Take 10 mg by mouth daily.  Marland Kitchen aspirin 81 MG chewable tablet Chew 81 mg by mouth daily.  Marland Kitchen atorvastatin (LIPITOR) 80 MG tablet Take 80 mg by mouth daily.  . cetirizine (ZYRTEC) 10 MG tablet Take 10 mg by mouth daily as needed for allergies.  . Dulaglutide (TRULICITY) 4.09 WJ/1.9JY SOPN Inject into the skin. Use as directed  . EPINEPHrine (EPIPEN 2-PAK) 0.3 mg/0.3 mL IJ SOAJ injection Inject 0.3 mg into the muscle as needed.  . ezetimibe (ZETIA) 10 MG tablet Take 10 mg by mouth daily.  Marland Kitchen glimepiride (AMARYL) 1 MG tablet Take 1 mg by mouth daily with breakfast.  . JARDIANCE 25 MG TABS tablet Take 25 mg by mouth daily.  Marland Kitchen losartan (COZAAR) 100 MG tablet Take 100 mg by mouth daily.  . metFORMIN (GLUCOPHAGE) 1000 MG tablet  Take 1,000 mg by mouth 2 (two) times daily with a meal.  . sildenafil (VIAGRA) 100 MG tablet Take 100 mg by mouth daily as needed for erectile dysfunction.     Allergies:   Bee venom   Social History   Socioeconomic History  . Marital status: Married    Spouse name: Not on file  . Number of children: 2  . Years of education: Not on file  . Highest education level: Not on file  Occupational History  . Not on file  Tobacco Use  . Smoking status: Never Smoker  . Smokeless tobacco: Former Systems developer    Types: Chew  Substance and Sexual Activity  . Alcohol use: Yes    Comment: 3 drinks a week.   . Drug use: No  . Sexual activity: Yes  Other Topics Concern  . Not on file  Social History Narrative  . Not on file   Social  Determinants of Health   Financial Resource Strain:   . Difficulty of Paying Living Expenses: Not on file  Food Insecurity:   . Worried About Charity fundraiser in the Last Year: Not on file  . Ran Out of Food in the Last Year: Not on file  Transportation Needs:   . Lack of Transportation (Medical): Not on file  . Lack of Transportation (Non-Medical): Not on file  Physical Activity:   . Days of Exercise per Week: Not on file  . Minutes of Exercise per Session: Not on file  Stress:   . Feeling of Stress : Not on file  Social Connections:   . Frequency of Communication with Friends and Family: Not on file  . Frequency of Social Gatherings with Friends and Family: Not on file  . Attends Religious Services: Not on file  . Active Member of Clubs or Organizations: Not on file  . Attends Archivist Meetings: Not on file  . Marital Status: Not on file     Family History: The patient's family history includes Aneurysm in his maternal grandfather and maternal uncle; Aneurysm (age of onset: 15) in his cousin; CAD in an other family member; Diabetes Mellitus II in his father and another family member; Heart attack in his father; Heart attack (age of onset: 96) in his paternal grandfather; Heart attack (age of onset: 28) in his paternal uncle; Heart attack (age of onset: 34) in his maternal grandmother; Heart failure in an other family member; Hypertension in his mother, paternal uncle, and another family member. There is no history of Colon polyps, Esophageal cancer, Rectal cancer, or Stomach cancer.  ROS:   Please see the history of present illness.    Denies any fevers chills nausea vomiting syncope bleeding all other systems reviewed and are negative.  EKGs/Labs/Other Studies Reviewed:    The following studies were reviewed today:  Coronary calcium score 08/29/2020: Left Main: 0  LAD: 144  LCx: 262  RCA: 0  Total Agatston Score: 405  MESA database percentile:  96  AORTA MEASUREMENTS:  Ascending Aorta: 45 mm  Descending Aorta: 26 mm  OTHER FINDINGS:  The heart is normal size. Calcifications in the aortic valve and aortic root. Calcified granuloma in the right lower lobe. No confluent opacities or effusions. No adenopathy. Imaging into the upper abdomen demonstrates no acute findings. Chest wall soft tissues are unremarkable. No acute bony abnormality.  IMPRESSION: The observed calcium score of 405 is at the percentile 96 for subjects of the same age, gender and race/ethnicity who  are free of clinical cardiovascular disease and treated diabetes.  4.5 cm ascending thoracic aortic aneurysm.  EKG:  EKG is  ordered today.  The ekg ordered today demonstrates sinus rhythm 75 with borderline interventricular conduction delay  Recent Labs: No results found for requested labs within last 8760 hours.  Recent Lipid Panel No results found for: CHOL, TRIG, HDL, CHOLHDL, VLDL, LDLCALC, LDLDIRECT   Risk Assessment/Calculations:       Physical Exam:    VS:  BP 130/80 (BP Location: Left Arm, Patient Position: Sitting, Cuff Size: Normal)   Pulse 75   Ht 5\' 11"  (1.803 m)   Wt 274 lb (124.3 kg)   SpO2 94%   BMI 38.22 kg/m     Wt Readings from Last 3 Encounters:  08/31/20 274 lb (124.3 kg)  11/22/16 277 lb (125.6 kg)  10/05/16 268 lb 2 oz (121.6 kg)     GEN:  Well nourished, well developed in no acute distress HEENT: Normal NECK: No JVD; No carotid bruits LYMPHATICS: No lymphadenopathy CARDIAC: RRR, no murmurs, rubs, gallops RESPIRATORY:  Clear to auscultation without rales, wheezing or rhonchi  ABDOMEN: Soft, non-tender, non-distended MUSCULOSKELETAL:  No edema; No deformity  SKIN: Warm and dry NEUROLOGIC:  Alert and oriented x 3 PSYCHIATRIC:  Normal affect   ASSESSMENT:    1. Aortic root dilation (HCC)   2. Aortic valve calcification   3. Coronary artery disease involving native coronary artery of native heart without  angina pectoris    PLAN:    In order of problems listed above:  Coronary calcium score 405 -We will go ahead and proceed with pharmacologic stress test to ensure that there is no evidence of high risk ischemia.  Is been several years since his last evaluation. -Personally reviewed and interpreted the CT images.  Showed him the arteries.  Dilated aortic root -45 mm noted ascending aorta.  Agree with measurement, personally reviewed. -In 1 year we will check echocardiogram as a different modality. -There could be a genetic component given his mother's side of the family history of both thoracic as well as abdominal aortic aneurysms. -Continue to optimize blood pressure control.  Currently on losartan.  If blood pressure remains elevated, consider beta-blocker to help reduce shear stress.  Calcified aortic valve -Noted on CT scan.  Checking echocardiogram.  Question of possible bicuspid valve. -No obvious murmurs heard on exam.  Hyperlipidemia -Continue with atorvastatin 80 mg, Zetia as well as new start Praluent.  Agree with aggressive secondary prevention.  The Praluent is currently being assessed by his insurance company. Last LDL 69.  Diabetes with hypertension -Hemoglobin A1c 7.1, on Metformin and Jardiance, excellent choice.  Dr. Virgina Jock managing.    Shared Decision Making/Informed Consent        Medication Adjustments/Labs and Tests Ordered: Current medicines are reviewed at length with the patient today.  Concerns regarding medicines are outlined above.  Orders Placed This Encounter  Procedures  . MYOCARDIAL PERFUSION IMAGING  . EKG 12-Lead  . ECHOCARDIOGRAM COMPLETE  . ECHOCARDIOGRAM COMPLETE   No orders of the defined types were placed in this encounter.   Patient Instructions  Medication Instructions:  Your physician recommends that you continue on your current medications as directed. Please refer to the Current Medication list given to you today.  *If you need  a refill on your cardiac medications before your next appointment, please call your pharmacy*   Lab Work: None Ordered  If you have labs (blood work) drawn today and your  tests are completely normal, you will receive your results only by: Marland Kitchen MyChart Message (if you have MyChart) OR . A paper copy in the mail If you have any lab test that is abnormal or we need to change your treatment, we will call you to review the results.   Testing/Procedures: Your physician has requested that you have an echocardiogram SOON AND REPEAT in 1 YEAR. Echocardiography is a painless test that uses sound waves to create images of your heart. It provides your doctor with information about the size and shape of your heart and how well your heart's chambers and valves are working. This procedure takes approximately one hour. There are no restrictions for this procedure.  Your physician has requested that you have a lexiscan myoview. For further information please visit [email or web address]. Please follow instruction sheet, as given.    Follow-Up: At [facility], you and your health needs are our priority.  As part of our continuing mission to provide you with exceptional heart care, we have created designated Provider Care Teams.  These Care Teams include your primary Cardiologist (physician) and Advanced Practice Providers (APPs -  Physician Assistants and Nurse Practitioners) who all work together to provide you with the care you need, when you need it.   Your next appointment:   1 year(s) after the echocardiogram  The format for your next appointment:   In Person  Provider:   You may see Candee Furbish, MD or one of the following Advanced Practice Providers on your designated Care Team:    Truitt Merle, NP  Cecilie Kicks, NP  Kathyrn Drown, NP        Signed, Candee Furbish, MD  08/31/2020 4:02 PM    [facility]

## 2020-09-26 ENCOUNTER — Telehealth (HOSPITAL_COMMUNITY): Payer: Self-pay | Admitting: *Deleted

## 2020-09-26 NOTE — Telephone Encounter (Signed)
Patient given detailed instructions per Myocardial Perfusion Study Information Sheet for the test on 10/01/19 at 7:15. Patient notified to arrive 15 minutes early and that it is imperative to arrive on time for appointment to keep from having the test rescheduled.  If you need to cancel or reschedule your appointment, please call the office within 24 hours of your appointment. . Patient verbalized understanding.Russell Burns

## 2020-09-30 ENCOUNTER — Ambulatory Visit (HOSPITAL_BASED_OUTPATIENT_CLINIC_OR_DEPARTMENT_OTHER): Payer: BC Managed Care – PPO

## 2020-09-30 ENCOUNTER — Other Ambulatory Visit: Payer: Self-pay

## 2020-09-30 ENCOUNTER — Ambulatory Visit (HOSPITAL_COMMUNITY): Payer: BC Managed Care – PPO | Attending: Cardiovascular Disease

## 2020-09-30 DIAGNOSIS — I2510 Atherosclerotic heart disease of native coronary artery without angina pectoris: Secondary | ICD-10-CM

## 2020-09-30 DIAGNOSIS — I77810 Thoracic aortic ectasia: Secondary | ICD-10-CM

## 2020-09-30 DIAGNOSIS — I359 Nonrheumatic aortic valve disorder, unspecified: Secondary | ICD-10-CM | POA: Diagnosis not present

## 2020-09-30 LAB — ECHOCARDIOGRAM COMPLETE
AR max vel: 1.95 cm2
AV Area VTI: 2 cm2
AV Area mean vel: 1.65 cm2
AV Mean grad: 19 mmHg
AV Peak grad: 30.3 mmHg
Ao pk vel: 2.75 m/s
Area-P 1/2: 4.36 cm2
Height: 71 in
S' Lateral: 3.3 cm
Weight: 4384 oz

## 2020-09-30 LAB — MYOCARDIAL PERFUSION IMAGING
LV dias vol: 108 mL (ref 62–150)
LV sys vol: 49 mL
Peak HR: 100 {beats}/min
Rest HR: 79 {beats}/min
SDS: 1
SRS: 0
SSS: 1
TID: 0.92

## 2020-09-30 MED ORDER — TECHNETIUM TC 99M TETROFOSMIN IV KIT
31.7000 | PACK | Freq: Once | INTRAVENOUS | Status: AC | PRN
  Administered 2020-09-30: 31.7 via INTRAVENOUS
  Filled 2020-09-30: qty 32

## 2020-09-30 MED ORDER — PERFLUTREN LIPID MICROSPHERE
1.0000 mL | INTRAVENOUS | Status: AC | PRN
  Administered 2020-09-30: 3 mL via INTRAVENOUS

## 2020-09-30 MED ORDER — REGADENOSON 0.4 MG/5ML IV SOLN
0.4000 mg | Freq: Once | INTRAVENOUS | Status: AC
  Administered 2020-09-30: 0.4 mg via INTRAVENOUS

## 2020-09-30 MED ORDER — TECHNETIUM TC 99M TETROFOSMIN IV KIT
10.8000 | PACK | Freq: Once | INTRAVENOUS | Status: AC | PRN
  Administered 2020-09-30: 10.8 via INTRAVENOUS
  Filled 2020-09-30: qty 11

## 2020-10-03 ENCOUNTER — Other Ambulatory Visit: Payer: Self-pay | Admitting: Nurse Practitioner

## 2020-10-03 DIAGNOSIS — I359 Nonrheumatic aortic valve disorder, unspecified: Secondary | ICD-10-CM

## 2020-10-03 DIAGNOSIS — I2510 Atherosclerotic heart disease of native coronary artery without angina pectoris: Secondary | ICD-10-CM

## 2020-10-03 DIAGNOSIS — I77810 Thoracic aortic ectasia: Secondary | ICD-10-CM

## 2020-10-27 ENCOUNTER — Encounter: Payer: Self-pay | Admitting: Cardiology

## 2020-10-27 ENCOUNTER — Ambulatory Visit: Payer: BC Managed Care – PPO | Admitting: Cardiology

## 2020-10-27 ENCOUNTER — Other Ambulatory Visit: Payer: Self-pay

## 2020-10-27 VITALS — BP 120/70 | HR 76 | Ht 71.0 in | Wt 271.0 lb

## 2020-10-27 DIAGNOSIS — I2510 Atherosclerotic heart disease of native coronary artery without angina pectoris: Secondary | ICD-10-CM | POA: Diagnosis not present

## 2020-10-27 DIAGNOSIS — E119 Type 2 diabetes mellitus without complications: Secondary | ICD-10-CM | POA: Diagnosis not present

## 2020-10-27 DIAGNOSIS — I77810 Thoracic aortic ectasia: Secondary | ICD-10-CM | POA: Diagnosis not present

## 2020-10-27 DIAGNOSIS — I359 Nonrheumatic aortic valve disorder, unspecified: Secondary | ICD-10-CM

## 2020-10-27 NOTE — Progress Notes (Signed)
Cardiology Office Note:    Date:  10/27/2020   ID:  Russell Burns, DOB 01-29-1966, MRN [id number]  PCP:  Russell Baton, MD  [facility] Cardiologist:  Russell Furbish, MD  [facility] Electrophysiologist:  None   Referring MD: Russell Baton, MD     History of Present Illness:    Russell Burns is a 55 y.o. male here for the follow-up of pharmacologic stress test in the setting of coronary calcium score of 405, calcified plaque.  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  Has a strong family history of aortic dissection/aneurysms.  His stress test overall was low risk without any significant evidence of ischemia.  His echocardiogram also showed normal pump function.  Could not exclude the possibility of bicuspid aortic valve.  He is on aggressive hyperlipidemia management with PCSK9 inhibitor as well as statin and Zetia.  His mother father had a AAA, cousin professor at [employer] died while walking descending aortic, mother's brother had descending aneurysm, her brother son had ascending aortic aneurysm dissection, found dead in his chair   I had seen him last over 3 years ago on 10/05/2016 given his family history of CAD. Previously had extensive cardiac evaluation in 2005 at the [facility] clinic in [place].  "Prior note:DM,strongFHX of CAD father. No CP no SOB. Mother side of family. Her father had AAA. Her brother had AAA as well. Another mother brother. A cousin died of ascending aorta.  A1c <7. No smoking. Chantix to help with snuff.  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  Overall he is not having any chest discomfort, no significant shortness of breath.  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   Russell Burns , sister, works with Dr. Shon Burns.   Overall he is doing well.  Here today with his wife.  Lengthy discussion.  Talked about diet, restrictions etc.  Past Medical History:  Diagnosis Date  . Allergy   . Blood transfusion without reported diagnosis   . Diabetes mellitus without complication ([place])   . Hyperlipidemia   . Hypertension   . Obesity     Past Surgical History:  Procedure Laterality Date  . TONSILLECTOMY    . WISDOM TOOTH EXTRACTION      Current Medications: Current Meds  Medication Sig  . Alirocumab (PRALUENT) 150 MG/ML SOAJ Inject 150 mLs into the skin. One shot on the 10th and the second on the 25th  . amLODipine (NORVASC) 10 MG tablet Take 10 mg by mouth daily.  Marland Kitchen aspirin 81 MG chewable tablet Chew 81 mg by mouth daily.  Marland Kitchen atorvastatin (LIPITOR) 80 MG tablet Take 80 mg by mouth daily.  . cetirizine (ZYRTEC) 10 MG tablet Take 10 mg by mouth daily as needed for allergies.  . Dulaglutide 0.75 MG/0.5ML SOPN Inject into the skin. Use as directed  . EPINEPHrine  0.3 mg/0.3 mL IJ SOAJ injection Inject 0.3 mg into the muscle as needed.  . ezetimibe (ZETIA) 10 MG tablet Take 10 mg by mouth daily.  Marland Kitchen glimepiride (AMARYL) 1 MG tablet Take 1 mg by mouth daily with breakfast.  . JARDIANCE 25 MG TABS tablet Take 25 mg by mouth daily.  Marland Kitchen losartan (COZAAR) 100 MG tablet Take 100 mg by mouth daily.  . metFORMIN (GLUCOPHAGE) 1000 MG tablet Take 1,000 mg by mouth 2 (two) times daily with a meal.  . sildenafil (VIAGRA) 100 MG tablet Take 100 mg by mouth daily as needed for erectile dysfunction.     Allergies:   Bee venom   Social History    Socioeconomic History  . Marital status: Married    Spouse name: Not on file  . Number of children: 2  . Years of education: Not on file  . Highest education level: Not on file  Occupational History  . Not on file  Tobacco Use  . Smoking status: Never Smoker  . Smokeless tobacco: Former Systems developer    Types: Chew  Substance and Sexual Activity  . Alcohol use: Yes    Comment: 3 drinks a week.   . Drug use: No  . Sexual activity: Yes  Other Topics Concern  . Not on file  Social History Narrative  . Not on file   Social Determinants of Health   Financial Resource Strain: Not on file  Food Insecurity: Not on file  Transportation Needs: Not on file  Physical Activity: Not on file  Stress: Not on file  Social Connections: Not on file     Family History: The patient's family history includes Aneurysm in his maternal grandfather and maternal uncle; Aneurysm (age of onset: 7) in his cousin; CAD in an other family member; Diabetes Mellitus II in his father and another family member; Heart attack in his father; Heart attack (age of onset: 38) in his paternal grandfather; Heart attack (age of onset: 74) in his paternal uncle; Heart attack (age of onset: 31) in his maternal grandmother; Heart failure in an other family member; Hypertension in his mother, paternal uncle, and another family member. There is no history of Colon polyps, Esophageal cancer, Rectal cancer, or Stomach cancer.  ROS:   Please see the history of present illness.     All other systems reviewed and are negative.  EKGs/Labs/Other Studies Reviewed:    The following studies were reviewed today:   1. Left ventricular ejection fraction, by estimation, is 60 to 65%. The  left ventricle has normal function. The left ventricle has no regional  wall motion abnormalities. Left ventricular diastolic parameters are  consistent with Grade I diastolic  dysfunction (impaired relaxation).  2. Right ventricular systolic  function is normal. The right ventricular  size is normal.  3. The mitral valve is normal in structure. Trivial mitral valve  regurgitation. No evidence of mitral stenosis.  4. The aortic vavle is poorly visualizd. It appears to be at least  moderately calcified and restricted. Cannot rule out bicuspid aortic  valve. The aortic valve is calcified. There is moderate calcification of  the aortic valve. There is moderate  thickening of the aortic valve. Aortic valve regurgitation is not  visualized. Mild to moderate aortic valve stenosis. Aortic valve area, by  VTI measures 2.00 cm. Aortic valve mean gradient measures 19.0 mmHg.  Aortic valve Vmax measures 2.75 m/s.  5. The inferior vena cava is normal in size with <50% respiratory  variability,  suggesting right atrial pressure of 8 mmHg.   CT calcium score 08/29/20:  Left Main: 0  LAD: 144  LCx: 262  RCA: 0  Total Agatston Score: 405  MESA database percentile: 96  AORTA MEASUREMENTS:  Ascending Aorta: 45 mm  Descending Aorta: 26 mm   NUC stress 09/30/20:   The left ventricular ejection fraction is normal (55-65%).  Nuclear stress EF: 55%.  There was no ST segment deviation noted during stress.  No T wave inversion was noted during stress.  The study is normal.  This is a low risk study.   1.  There are reduced counts in the apex on rest and stress imaging with normal wall motion consistent with apical thinning artifact.  Overall, this is a normal study without evidence of ischemia or prior infarction. 2.  Normal LVEF, 55%. 3.  This is a low risk study.  EKG: 08/31/2020 --sinus rhythm 75 borderline conduction delay  Recent Labs: No results found for requested labs within last 8760 hours.  Recent Lipid Panel No results found for: CHOL, TRIG, HDL, CHOLHDL, VLDL, LDLCALC, LDLDIRECT   Risk Assessment/Calculations:      Physical Exam:    VS:  BP 120/70 (BP Location: Left Arm, Patient Position:  Sitting, Cuff Size: Normal)   Pulse 76   Ht 5\' 11"  (1.803 m)   Wt 271 lb (122.9 kg)   SpO2 95%   BMI 37.80 kg/m     Wt Readings from Last 3 Encounters:  10/27/20 271 lb (122.9 kg)  09/30/20 274 lb (124.3 kg)  08/31/20 274 lb (124.3 kg)     GEN:  Well nourished, well developed in no acute distress HEENT: Normal NECK: No JVD; No carotid bruits LYMPHATICS: No lymphadenopathy CARDIAC: RRR, 1/6 SM RUSB, rubs, gallops RESPIRATORY:  Clear to auscultation without rales, wheezing or rhonchi  ABDOMEN: Soft, non-tender, non-distended MUSCULOSKELETAL:  No edema; No deformity  SKIN: Warm and dry NEUROLOGIC:  Alert and oriented x 3 PSYCHIATRIC:  Normal affect   ASSESSMENT:    1. Aortic root dilation (HCC)   2. Aortic valve calcification   3. Coronary artery disease involving native coronary artery of native heart without angina pectoris   4. Diabetes mellitus without complication (HCC)    PLAN:    In order of problems listed above:  Coronary artery disease/calcified plaque -Elevated coronary calcium score 405 95th percentile.  Nuclear stress test overall was reassuring without any evidence of ischemia.  Overall low risk test. -Continue with aggressive secondary risk factor prevention.  Dilated aortic root -45 mm previously ascending aorta. -In 1 year check echocardiogram as a different modality. -Cannot rule out bicuspid aortic valve on last echocardiogram -Given family history of multiple family members with aneurysms, we will go ahead and refer him to Dr. Arlie Solomons, geneticist.  Mild to moderate aortic valve stenosis -Possibly bicuspid valve, makes sense for the calcification, age of moderate disease as well as dilated ascending aorta. -In 1 year, we will repeat echocardiogram -In 1 year, I will check a TAVR protocol CT scan to evaluate fully his aortic tree and to determine bicuspid or trileaflet aortic valve.  Hyperlipidemia -On atorvastatin 80, Zetia as well as new start  Praluent.  Aggressive.  Last LDL 69.  Diabetes with hypertension -Hemoglobin A1c 7.1 on Jardiance Metformin, Dr. Virgina Jock following closely.  1 year follow-up  Medication Adjustments/Labs and Tests Ordered: Current medicines are reviewed at length with the patient today.  Concerns regarding medicines are outlined above.  No orders  of the defined types were placed in this encounter.  No orders of the defined types were placed in this encounter.   There are no Patient Instructions on file for this visit.   Signed, Russell Furbish, MD  10/27/2020 3:16 PM    [place]

## 2020-10-27 NOTE — Patient Instructions (Signed)
Medication Instructions:  The current medical regimen is effective;  continue present plan and medications.  *If you need a refill on your cardiac medications before your next appointment, please call your pharmacy*  Testing/Procedures: Your physician has requested that you have an echocardiogram in 1 year. Echocardiography is a painless test that uses sound waves to create images of your heart. It provides your doctor with information about the size and shape of your heart and how well your heart's chambers and valves are working. This procedure takes approximately one hour. There are no restrictions for this procedure.  Follow-Up: At [facility], you and your health needs are our priority.  As part of our continuing mission to provide you with exceptional heart care, we have created designated Provider Care Teams.  These Care Teams include your primary Cardiologist (physician) and Advanced Practice Providers (APPs -  Physician Assistants and Nurse Practitioners) who all work together to provide you with the care you need, when you need it.  We recommend signing up for the patient portal called "MyChart".  Sign up information is provided on this After Visit Summary.  MyChart is used to connect with patients for Virtual Visits (Telemedicine).  Patients are able to view lab/test results, encounter notes, upcoming appointments, etc.  Non-urgent messages can be sent to your provider as well.   To learn more about what you can do with MyChart, go to [email or web address].    Your next appointment:   12 month(s)  The format for your next appointment:   In Person  Provider:   Mark Skains, MD    Thank you for choosing [facility]!!     

## 2020-11-21 DIAGNOSIS — M9905 Segmental and somatic dysfunction of pelvic region: Secondary | ICD-10-CM | POA: Diagnosis not present

## 2020-11-21 DIAGNOSIS — M9902 Segmental and somatic dysfunction of thoracic region: Secondary | ICD-10-CM | POA: Diagnosis not present

## 2020-11-21 DIAGNOSIS — M9903 Segmental and somatic dysfunction of lumbar region: Secondary | ICD-10-CM | POA: Diagnosis not present

## 2020-11-21 DIAGNOSIS — M531 Cervicobrachial syndrome: Secondary | ICD-10-CM | POA: Diagnosis not present

## 2020-11-23 DIAGNOSIS — M9903 Segmental and somatic dysfunction of lumbar region: Secondary | ICD-10-CM | POA: Diagnosis not present

## 2020-11-23 DIAGNOSIS — M9902 Segmental and somatic dysfunction of thoracic region: Secondary | ICD-10-CM | POA: Diagnosis not present

## 2020-11-23 DIAGNOSIS — M791 Myalgia, unspecified site: Secondary | ICD-10-CM | POA: Diagnosis not present

## 2020-11-23 DIAGNOSIS — M9905 Segmental and somatic dysfunction of pelvic region: Secondary | ICD-10-CM | POA: Diagnosis not present

## 2020-11-25 DIAGNOSIS — M791 Myalgia, unspecified site: Secondary | ICD-10-CM | POA: Diagnosis not present

## 2020-11-25 DIAGNOSIS — M9905 Segmental and somatic dysfunction of pelvic region: Secondary | ICD-10-CM | POA: Diagnosis not present

## 2020-11-25 DIAGNOSIS — M9903 Segmental and somatic dysfunction of lumbar region: Secondary | ICD-10-CM | POA: Diagnosis not present

## 2020-11-25 DIAGNOSIS — M9902 Segmental and somatic dysfunction of thoracic region: Secondary | ICD-10-CM | POA: Diagnosis not present

## 2020-11-28 DIAGNOSIS — M9902 Segmental and somatic dysfunction of thoracic region: Secondary | ICD-10-CM | POA: Diagnosis not present

## 2020-11-28 DIAGNOSIS — M791 Myalgia, unspecified site: Secondary | ICD-10-CM | POA: Diagnosis not present

## 2020-11-28 DIAGNOSIS — M9903 Segmental and somatic dysfunction of lumbar region: Secondary | ICD-10-CM | POA: Diagnosis not present

## 2020-11-28 DIAGNOSIS — M9905 Segmental and somatic dysfunction of pelvic region: Secondary | ICD-10-CM | POA: Diagnosis not present

## 2020-12-01 ENCOUNTER — Other Ambulatory Visit: Payer: Self-pay

## 2020-12-01 ENCOUNTER — Ambulatory Visit: Payer: BC Managed Care – PPO | Admitting: Genetic Counselor

## 2020-12-07 ENCOUNTER — Other Ambulatory Visit: Payer: Self-pay | Admitting: *Deleted

## 2020-12-07 DIAGNOSIS — I77810 Thoracic aortic ectasia: Secondary | ICD-10-CM

## 2020-12-07 DIAGNOSIS — Z1379 Encounter for other screening for genetic and chromosomal anomalies: Secondary | ICD-10-CM

## 2020-12-07 DIAGNOSIS — Z8249 Family history of ischemic heart disease and other diseases of the circulatory system: Secondary | ICD-10-CM

## 2020-12-07 NOTE — Progress Notes (Signed)
Please order as below. Thanks  -Elta Guadeloupe       From: Debbe Mounts, PhD  Sent: 12/02/2020 11:49 AM EDT  To: Jerline Pain, MD  Subject: Order fro genetic testing             Dear Dr. Marlou Porch,   I met with Cederic yesterday and he would like to proceed with genetic testing. He had his blood drawn.   Can you please put in an order for testing of vEDS, LDS and FTAAD.   I will be sending you my notes soon. Very profound family history of TAAD!   Take care  Sumy    Orders placed for lab draw as instructed.

## 2020-12-12 NOTE — Progress Notes (Signed)
Referring Provider: Candee Furbish, MD  Referral Reason Trixie Dredge was referred for genetic consult and testing of aortopathies subsequent to a CT-Angiography that detected aortic root dilatation of 4.5 cm   Personal Medical Information Russell Burns (IV.1 on pedigree) is a 55 year old pleasant Caucasian man who reports to be the first male in his family to have no heart issues after the age of 50!Russell Burns He reports having no overt symptoms in the past other than once when he felt a sharp pain in his pectoral muscle. He says that he had a normal stress test and attributed his symptoms to his high stress job. After his divorce, he moved around the country and then returned to [facility] and wanted to be established with a health care provider. His lab tests indicted elevated cholesterol with a calcium score of 405. He underwent a CT-A to look for plaques and was found to have an aortic dilatation of 4.5 cm.     Traditional Risk Factors Russell Burns reports being diagnosed with hypertension at age 39 and states that it is well controlled with medication. He has a history of hypercholesterolemia. No history of prior aortic dissection or smoking.   Family history Russell Burns (IV.1) has a 42 y.o. son (V.1) and a 47 y.o daughter (IV.2)- both are in great health and have not yet undergone cardiac screening for TAA. His younger sister (IV.2), age has not yet had a CT-A screen.   Hutch's father ((III.2), had ASCVD- with 3 heart attacks at age 62, and then in his 65s. He had an angioplasty at 25 and CABG soon after. There is no history of aortic aneurysm in his paternal relatives.  Jeniel's mother (III.4) is now 34, in good health and had a normal CTA twenty years ago. Her older brother (III.5) died at 28 from lung cancer. He had a descending thoracic aortic aneurysm at 66 that was later repaired. His son (IV.3) died at 5 from a dissected thoracic aortic aneurysm. There is significant history of aortic aneurysms in his maternal  relatives- his maternal grandfather (II.6) had an abdominal aortic aneurysm at age 25, his brother (II.5) died of an abdominal aortic aneurysm rupture at 9, and his grandfather's niece (III.3) died at 60 from an aortic aneurysm. Russell Burns's maternal great grandfather (I.2) died suddenly at age 54 at his brother's funeral who also died suddenly at age 89 (I.1).  Pre-Test Genetic Consultation Notes  Russell Burns was counseled on the genetics of syndromes that present with aortic aneurysms and dissections, specifically Marfan syndrome (MFS), Loeys-Dietz syndrome (LDS), Vascular Ehlers-Danlos Syndrome (vEDS) and other non-syndromic familial forms of thoracic aortic aneurysms and dissection (FTAAD). I explained to him that aortopathies are an autosomal dominant condition that in light of the overwhelming family history of aortic aneurysm and dissections in his maternal relatives, he has likely inherited from his mother. Thus, his sister and children have a 50% chance of inheriting this condition. I explained to him that his first degree-relatives should get screened for thoracic aortic aneurysms. He verbalized understanding of this.   I discussed incomplete penetrance associated with this condition i.e. not all individuals harboring a mutation will present clinically, and age-related penetrance where clinical presentation increases with advanced age. Based on his family history it highly likely that he inherited this condition from his mother.    I also reviewed variable expression and emphasized that this condition can express at any age at any level of severity in the family. Hence, it is important for his first-degree relatives to  undergo CTA screening for aortic aneurysms. He verbalized understanding of this.   We walked through the process of genetic testing.   I explained to him that genetic testing is a probabilistic test dependent upon his age and severity of presentation, presence of risk factors and  importantly family history of aortic aneurysms or sudden death in first-degree relatives.  The potential outcomes of genetic testing and subsequent management of at-risk family members were discussed so as to manage expectations-  I explained to him that if a mutation is not identified, then all first-degree relatives should undergo cardiac screening for aortic aneurysms. While nearly 11% of AoD cases are idiopathic, the presence of a significant family history of disease or sudden death makes it likely that he has a genetic condition.  A negative test result can be due to limitations of the genetic test. He verbalized understanding of this.  There is also the likelihood of identifying a "Variant of unknown significance". This result means that the variant has not been detected in a statistically significant number of patients and/or functional studies have not been performed to verify its pathogenicity. This VUS can be tested in the family to see if it segregates with disease. If a VUS is found, first-degree relatives should get screened.  If a pathogenic variant is reported, then his first-degree family members can get tested for this variant. If they test positive, it is likely they will develop an aortic aneurysm. In light of variable expression and incomplete penetrance associated with this condition, it is not possible to predict when they will manifest clinically with an aneurysm. It is recommended that family members that test positive for the familial pathogenic variant pursue clinical screening.  Impression  In summary, Russell Burns presents with an aortic root dilatation at age 35 in the absence of traditional risk factors. Additionally, there is a significant family history of aortic aneurysms and dissections, and sudden death amongst his maternal relatives. Thus, it is highly likely that he has a genetic condition.   Genetic testing for the genes implicated in connective tissue disorders is  recommended. This test should include the major genes that contribute to LDS, vEDS and FTAAD. The genetic test will help confirm his diagnosis and identify the genetic basis of his disease. Since this is an autosomal dominant condition, a positive test result will help identify first-degree family members that may harbor the mutation and are at risk of developing this condition. Appropriate cardiology follow-up and lifestyle management can then be directed to those genotype-positive family members.   In addition, we discussed the protections afforded by the Genetic Information Non-Discrimination Act (GINA). I explained to him that GINA protects him from losing employment or health insurance based on his genotype. However, these protections do not cover life insurance and disability. He verbalized understanding of this and states that his kids do not have life insurance.  Please note that the patient has not been counseled in this visit on personal, cultural or ethical issues that he may face due to his heart condition.   Plan After a thorough discussion of the risk and benefits of genetic testing for HCM, Clayten states his intent to pursue genetic testing for HCM and signed the informed consent form. Blood was drawn today for  for genetic testing of aortopathies.    Lattie Corns, Ph.D, [facility] Clinical Molecular Geneticist

## 2021-01-03 DIAGNOSIS — E1165 Type 2 diabetes mellitus with hyperglycemia: Secondary | ICD-10-CM | POA: Diagnosis not present

## 2021-01-03 DIAGNOSIS — Z125 Encounter for screening for malignant neoplasm of prostate: Secondary | ICD-10-CM | POA: Diagnosis not present

## 2021-01-03 DIAGNOSIS — E785 Hyperlipidemia, unspecified: Secondary | ICD-10-CM | POA: Diagnosis not present

## 2021-01-30 DIAGNOSIS — I77810 Thoracic aortic ectasia: Secondary | ICD-10-CM | POA: Diagnosis not present

## 2021-01-31 DIAGNOSIS — I77810 Thoracic aortic ectasia: Secondary | ICD-10-CM | POA: Diagnosis not present

## 2021-02-06 DIAGNOSIS — R82998 Other abnormal findings in urine: Secondary | ICD-10-CM | POA: Diagnosis not present

## 2021-02-06 DIAGNOSIS — I10 Essential (primary) hypertension: Secondary | ICD-10-CM | POA: Diagnosis not present

## 2021-02-06 DIAGNOSIS — E1165 Type 2 diabetes mellitus with hyperglycemia: Secondary | ICD-10-CM | POA: Diagnosis not present

## 2021-02-06 DIAGNOSIS — Z0000 Encounter for general adult medical examination without abnormal findings: Secondary | ICD-10-CM | POA: Diagnosis not present

## 2021-02-28 DIAGNOSIS — D2271 Melanocytic nevi of right lower limb, including hip: Secondary | ICD-10-CM | POA: Diagnosis not present

## 2021-02-28 DIAGNOSIS — D2239 Melanocytic nevi of other parts of face: Secondary | ICD-10-CM | POA: Diagnosis not present

## 2021-02-28 DIAGNOSIS — D2261 Melanocytic nevi of right upper limb, including shoulder: Secondary | ICD-10-CM | POA: Diagnosis not present

## 2021-02-28 DIAGNOSIS — D2262 Melanocytic nevi of left upper limb, including shoulder: Secondary | ICD-10-CM | POA: Diagnosis not present

## 2021-03-07 ENCOUNTER — Other Ambulatory Visit: Payer: Self-pay

## 2021-03-07 ENCOUNTER — Ambulatory Visit: Payer: BC Managed Care – PPO | Admitting: Genetic Counselor

## 2021-03-09 NOTE — Progress Notes (Signed)
Post-test Genetic Consultation notes   Russell Burns is here today for his post-test genetic consult. He notes no changes to his medical history since we met. We then reviewed his pedigree and he reports no changes in his family members.   I informed Telvin that he does not have a pathogenic variant for the aortopathies that include vascular EDS, Loeys-Dietz syndrome and FTAAD.  I explained to him that this indicates he does not have a mutation in the major genes implicated in aortopathies. In light of the dramatic family history of  thoracic and abdominal aneurysm and death from these conditions in his maternal relatives, it is likely that he did not inherit the familial condition as his mother,  now age 54, does not have any symptoms suggestive of a connective tissue disorder. In light of incomplete penetrance, his mother would have presenting clinically with an aortopathy by age 42. It this seems likely that his mother did not inherit the familial pathogenic variant for aortopathies. This indicates that his increasing aortic root dilatation may be related to uncontrolled blood pressure and is likely not a genetic condition. He was relieved to hear this. Nevertheless, I impressed upon him the importance of controlling his blood pressure. Also recommended that his mother get a CT-A, since the last one was 20 years ago to determine if she has an aortopathy. He verbalized understanding of this. Meanwhile, his children do not need any further screening for aortopathies at this time.   Lattie Corns, Ph.D, [facility] Clinical Molecular Geneticist

## 2021-06-27 DIAGNOSIS — Z23 Encounter for immunization: Secondary | ICD-10-CM | POA: Diagnosis not present

## 2021-06-27 DIAGNOSIS — E1165 Type 2 diabetes mellitus with hyperglycemia: Secondary | ICD-10-CM | POA: Diagnosis not present

## 2021-06-27 DIAGNOSIS — I10 Essential (primary) hypertension: Secondary | ICD-10-CM | POA: Diagnosis not present

## 2021-09-29 ENCOUNTER — Other Ambulatory Visit: Payer: Self-pay

## 2021-09-29 ENCOUNTER — Other Ambulatory Visit (HOSPITAL_COMMUNITY): Payer: BC Managed Care – PPO

## 2021-09-29 ENCOUNTER — Ambulatory Visit (HOSPITAL_COMMUNITY): Payer: BC Managed Care – PPO | Attending: Cardiology

## 2021-09-29 DIAGNOSIS — I2510 Atherosclerotic heart disease of native coronary artery without angina pectoris: Secondary | ICD-10-CM

## 2021-09-29 DIAGNOSIS — I359 Nonrheumatic aortic valve disorder, unspecified: Secondary | ICD-10-CM

## 2021-09-29 DIAGNOSIS — I77810 Thoracic aortic ectasia: Secondary | ICD-10-CM

## 2021-09-29 LAB — ECHOCARDIOGRAM COMPLETE
AR max vel: 1.37 cm2
AV Area VTI: 1.13 cm2
AV Area mean vel: 1.17 cm2
AV Mean grad: 25.7 mmHg
AV Peak grad: 42.4 mmHg
Ao pk vel: 3.26 m/s
Area-P 1/2: 3.31 cm2
S' Lateral: 3.2 cm

## 2021-10-03 ENCOUNTER — Telehealth: Payer: Self-pay | Admitting: *Deleted

## 2021-10-03 DIAGNOSIS — I712 Thoracic aortic aneurysm, without rupture, unspecified: Secondary | ICD-10-CM

## 2021-10-03 DIAGNOSIS — E1165 Type 2 diabetes mellitus with hyperglycemia: Secondary | ICD-10-CM | POA: Diagnosis not present

## 2021-10-03 DIAGNOSIS — Z01812 Encounter for preprocedural laboratory examination: Secondary | ICD-10-CM

## 2021-10-03 NOTE — Telephone Encounter (Signed)
Normal pulm function 60%.  Moderate aortic valve stenosis.  Previously on CT scan in 2021 coronary calcium score, aortic dimension was 45 mm.  Echocardiogram did not show this portion of aorta.   Lets go ahead and check a chest CTA with contrast to measure aorta.  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

## 2021-10-08 DIAGNOSIS — Z20822 Contact with and (suspected) exposure to covid-19: Secondary | ICD-10-CM | POA: Diagnosis not present

## 2021-10-08 DIAGNOSIS — J0140 Acute pansinusitis, unspecified: Secondary | ICD-10-CM | POA: Diagnosis not present

## 2021-10-08 DIAGNOSIS — J209 Acute bronchitis, unspecified: Secondary | ICD-10-CM | POA: Diagnosis not present

## 2021-10-08 DIAGNOSIS — Z03818 Encounter for observation for suspected exposure to other biological agents ruled out: Secondary | ICD-10-CM | POA: Diagnosis not present

## 2021-10-13 ENCOUNTER — Other Ambulatory Visit: Payer: BC Managed Care – PPO | Admitting: *Deleted

## 2021-10-13 ENCOUNTER — Other Ambulatory Visit: Payer: Self-pay

## 2021-10-13 DIAGNOSIS — Z01812 Encounter for preprocedural laboratory examination: Secondary | ICD-10-CM | POA: Diagnosis not present

## 2021-10-13 DIAGNOSIS — I712 Thoracic aortic aneurysm, without rupture, unspecified: Secondary | ICD-10-CM | POA: Diagnosis not present

## 2021-10-14 LAB — BASIC METABOLIC PANEL
BUN/Creatinine Ratio: 14 (ref 9–20)
BUN: 14 mg/dL (ref 6–24)
CO2: 21 mmol/L (ref 20–29)
Calcium: 10.1 mg/dL (ref 8.7–10.2)
Chloride: 99 mmol/L (ref 96–106)
Creatinine, Ser: 1.01 mg/dL (ref 0.76–1.27)
Glucose: 120 mg/dL — ABNORMAL HIGH (ref 70–99)
Potassium: 4.5 mmol/L (ref 3.5–5.2)
Sodium: 140 mmol/L (ref 134–144)
eGFR: 88 mL/min/{1.73_m2} (ref >59)

## 2021-10-16 DIAGNOSIS — J069 Acute upper respiratory infection, unspecified: Secondary | ICD-10-CM | POA: Diagnosis not present

## 2021-10-16 DIAGNOSIS — R051 Acute cough: Secondary | ICD-10-CM | POA: Diagnosis not present

## 2021-10-20 ENCOUNTER — Other Ambulatory Visit: Payer: Self-pay

## 2021-10-20 ENCOUNTER — Ambulatory Visit (INDEPENDENT_AMBULATORY_CARE_PROVIDER_SITE_OTHER)
Admission: RE | Admit: 2021-10-20 | Discharge: 2021-10-20 | Disposition: A | Discharge status: Home / Self Care | Payer: BC Managed Care – PPO | Source: Ambulatory Visit | Attending: Cardiology | Admitting: Cardiology

## 2021-10-20 DIAGNOSIS — I712 Thoracic aortic aneurysm, without rupture, unspecified: Secondary | ICD-10-CM

## 2021-10-20 DIAGNOSIS — J9811 Atelectasis: Secondary | ICD-10-CM | POA: Diagnosis not present

## 2021-10-20 DIAGNOSIS — I7121 Aneurysm of the ascending aorta, without rupture: Secondary | ICD-10-CM | POA: Diagnosis not present

## 2021-10-20 MED ORDER — IOHEXOL 350 MG/ML SOLN
100.0000 mL | Freq: Once | INTRAVENOUS | Status: AC | PRN
  Administered 2021-10-20: 100 mL via INTRAVENOUS

## 2021-10-23 ENCOUNTER — Telehealth: Payer: Self-pay | Admitting: Cardiology

## 2021-10-23 NOTE — Telephone Encounter (Signed)
Awaiting reading of CTA by Dr Marlou Porch.  Pt to be contacted once this has been completed.

## 2021-10-23 NOTE — Telephone Encounter (Signed)
°  Patient would like to discuss his test results and any recommendations that Dr Marlou Porch has for him.

## 2021-10-24 ENCOUNTER — Telehealth: Payer: Self-pay

## 2021-10-24 NOTE — Telephone Encounter (Signed)
-----   Message from Jerline Pain, MD sent at 10/24/2021  5:53 AM EST ----- Aorta 46 mm on axial images. Unchanged Repeat CTA chest to evaluate aorta in one year Candee Furbish, MD

## 2021-10-24 NOTE — Telephone Encounter (Signed)
Called to relay results of CT Aorta and Dr Marlou Porch' recommendation to patient who had already reviewed the test results via [place]. Pt is concerned that the ECHO and the CT Aorta could not conclusively verify if his Aortic valve is bicuspid and would like additional guidance from Dr Marlou Porch on this, due to his "constantly feeling fatigued."  Pt also concerned about the lesion found on his kidney and is requesting additional workup or review of this. Informed pt that per the reading radiologist, a CT of his abdomen would be preferred, but that we would need to see what Dr Marlou Porch thought and if he wanted to pursue that. Pt agrees and is aware that Dr is out of office today. Message sent to [facility] for advice.

## 2021-10-25 ENCOUNTER — Other Ambulatory Visit: Payer: Self-pay | Admitting: Cardiology

## 2021-10-25 ENCOUNTER — Other Ambulatory Visit (HOSPITAL_COMMUNITY): Payer: Self-pay | Admitting: Cardiology

## 2021-10-25 ENCOUNTER — Encounter (HOSPITAL_COMMUNITY): Payer: Self-pay

## 2021-10-25 ENCOUNTER — Other Ambulatory Visit (HOSPITAL_COMMUNITY): Payer: Self-pay | Admitting: *Deleted

## 2021-10-25 DIAGNOSIS — I2510 Atherosclerotic heart disease of native coronary artery without angina pectoris: Secondary | ICD-10-CM

## 2021-10-25 DIAGNOSIS — Q231 Congenital insufficiency of aortic valve: Secondary | ICD-10-CM

## 2021-10-25 MED ORDER — METOPROLOL TARTRATE 100 MG PO TABS: ORAL_TABLET | ORAL | 0 refills | Status: DC

## 2021-10-25 NOTE — Telephone Encounter (Signed)
Understood. Retrospective coronary CTA to eval CAD and aortic valve morphology.  Army Melia, MD  Lorenza Evangelist, RN 18 hours ago (5:01 PM)   Russell Burns, the protocol needs to be RETROSPECTIVE in order to see his aortic valve throughout all cardiac cycles to determine his possible bicuspid status.    Jerline Pain, MD  You; Cira Servant, Judson Roch Vonna Kotyk, RN 19 hours ago (5:00 PM)   I have forwarded results to Dr. [name] his primary care physician for guidance on evaluation of kidney.   In relation to the possible bicuspid valve, lets set him up for a coronary CT scan, gated, retrospective study to evaluate/clarify his aortic valve as well as coronary arteries given his ongoing symptoms (diagnosis angina and known coronary artery disease)   Candee Furbish, MD    Pt has been scheduled for testing as ordered 10/26/2021.

## 2021-10-25 NOTE — Telephone Encounter (Signed)
Please see documentation in phone note from 1/31 for more information.

## 2021-10-26 ENCOUNTER — Encounter (HOSPITAL_COMMUNITY): Payer: Self-pay

## 2021-10-26 ENCOUNTER — Ambulatory Visit (HOSPITAL_COMMUNITY)
Admission: RE | Admit: 2021-10-26 | Discharge: 2021-10-26 | Disposition: A | Discharge status: Home / Self Care | Payer: BC Managed Care – PPO | Source: Ambulatory Visit | Attending: Cardiology | Admitting: Cardiology

## 2021-10-26 ENCOUNTER — Other Ambulatory Visit: Payer: Self-pay

## 2021-10-26 DIAGNOSIS — Q231 Congenital insufficiency of aortic valve: Secondary | ICD-10-CM | POA: Insufficient documentation

## 2021-10-26 DIAGNOSIS — I2510 Atherosclerotic heart disease of native coronary artery without angina pectoris: Secondary | ICD-10-CM | POA: Diagnosis not present

## 2021-10-26 MED ORDER — METOPROLOL TARTRATE 5 MG/5ML IV SOLN: 10.0000 mg | INTRAVENOUS | Status: DC | PRN

## 2021-10-26 MED ORDER — METOPROLOL TARTRATE 5 MG/5ML IV SOLN
INTRAVENOUS | Status: AC
  Filled 2021-10-26: qty 10

## 2021-10-26 MED ORDER — NITROGLYCERIN 0.4 MG SL SUBL
0.8000 mg | SUBLINGUAL_TABLET | Freq: Once | SUBLINGUAL | Status: AC
  Administered 2021-10-26: 0.8 mg via SUBLINGUAL

## 2021-10-26 MED ORDER — IOHEXOL 350 MG/ML SOLN
95.0000 mL | Freq: Once | INTRAVENOUS | Status: AC | PRN
  Administered 2021-10-26: 95 mL via INTRAVENOUS

## 2021-10-26 MED ORDER — NITROGLYCERIN 0.4 MG SL SUBL
SUBLINGUAL_TABLET | SUBLINGUAL | Status: AC
  Filled 2021-10-26: qty 2

## 2021-11-09 ENCOUNTER — Other Ambulatory Visit: Payer: Self-pay | Admitting: Internal Medicine

## 2021-11-09 DIAGNOSIS — N281 Cyst of kidney, acquired: Secondary | ICD-10-CM

## 2021-11-14 ENCOUNTER — Other Ambulatory Visit (HOSPITAL_COMMUNITY): Payer: Self-pay

## 2021-11-14 MED ORDER — MOUNJARO 10 MG/0.5ML ~~LOC~~ SOAJ
10.0000 mg | SUBCUTANEOUS | 3 refills | Status: DC
  Filled 2021-11-14: qty 6, 84d supply, fill #0
  Filled 2021-11-22: qty 2, 28d supply, fill #0
  Filled 2021-11-30: qty 6, 84d supply, fill #0
  Filled 2021-12-01: qty 2, 28d supply, fill #0
  Filled 2021-12-20: qty 6, 84d supply, fill #0
  Filled 2022-04-06: qty 2, 28d supply, fill #0

## 2021-11-22 ENCOUNTER — Other Ambulatory Visit (HOSPITAL_COMMUNITY): Payer: Self-pay

## 2021-11-30 ENCOUNTER — Other Ambulatory Visit (HOSPITAL_COMMUNITY): Payer: Self-pay

## 2021-12-01 ENCOUNTER — Other Ambulatory Visit (HOSPITAL_COMMUNITY): Payer: Self-pay

## 2021-12-05 DIAGNOSIS — R053 Chronic cough: Secondary | ICD-10-CM | POA: Diagnosis not present

## 2021-12-05 DIAGNOSIS — D582 Other hemoglobinopathies: Secondary | ICD-10-CM | POA: Diagnosis not present

## 2021-12-05 DIAGNOSIS — E785 Hyperlipidemia, unspecified: Secondary | ICD-10-CM | POA: Diagnosis not present

## 2021-12-06 DIAGNOSIS — I10 Essential (primary) hypertension: Secondary | ICD-10-CM | POA: Diagnosis not present

## 2021-12-20 ENCOUNTER — Other Ambulatory Visit (HOSPITAL_COMMUNITY): Payer: Self-pay

## 2021-12-26 ENCOUNTER — Ambulatory Visit
Admission: RE | Admit: 2021-12-26 | Discharge: 2021-12-26 | Disposition: A | Discharge status: Home / Self Care | Payer: BC Managed Care – PPO | Source: Ambulatory Visit | Attending: Internal Medicine | Admitting: Internal Medicine

## 2021-12-26 DIAGNOSIS — N281 Cyst of kidney, acquired: Secondary | ICD-10-CM | POA: Diagnosis not present

## 2021-12-26 DIAGNOSIS — R1900 Intra-abdominal and pelvic swelling, mass and lump, unspecified site: Secondary | ICD-10-CM | POA: Diagnosis not present

## 2021-12-26 DIAGNOSIS — N2889 Other specified disorders of kidney and ureter: Secondary | ICD-10-CM | POA: Diagnosis not present

## 2021-12-26 MED ORDER — IOPAMIDOL (ISOVUE-300) INJECTION 61%
100.0000 mL | Freq: Once | INTRAVENOUS | Status: AC | PRN
  Administered 2021-12-26: 100 mL via INTRAVENOUS

## 2022-01-01 ENCOUNTER — Emergency Department (HOSPITAL_COMMUNITY): Payer: BC Managed Care – PPO

## 2022-01-01 ENCOUNTER — Other Ambulatory Visit: Payer: Self-pay

## 2022-01-01 ENCOUNTER — Inpatient Hospital Stay (HOSPITAL_COMMUNITY)
Admission: EM | Admit: 2022-01-01 | Discharge: 2022-01-04 | DRG: 189 | Disposition: A | Discharge status: Home / Self Care | Payer: BC Managed Care – PPO | Attending: Internal Medicine | Admitting: Internal Medicine

## 2022-01-01 ENCOUNTER — Encounter (HOSPITAL_COMMUNITY): Payer: Self-pay | Admitting: Emergency Medicine

## 2022-01-01 DIAGNOSIS — I110 Hypertensive heart disease with heart failure: Secondary | ICD-10-CM | POA: Diagnosis not present

## 2022-01-01 DIAGNOSIS — Z7984 Long term (current) use of oral hypoglycemic drugs: Secondary | ICD-10-CM

## 2022-01-01 DIAGNOSIS — Z7982 Long term (current) use of aspirin: Secondary | ICD-10-CM

## 2022-01-01 DIAGNOSIS — Z833 Family history of diabetes mellitus: Secondary | ICD-10-CM | POA: Diagnosis not present

## 2022-01-01 DIAGNOSIS — I5032 Chronic diastolic (congestive) heart failure: Secondary | ICD-10-CM | POA: Diagnosis not present

## 2022-01-01 DIAGNOSIS — Z825 Family history of asthma and other chronic lower respiratory diseases: Secondary | ICD-10-CM

## 2022-01-01 DIAGNOSIS — I712 Thoracic aortic aneurysm, without rupture, unspecified: Secondary | ICD-10-CM

## 2022-01-01 DIAGNOSIS — N2889 Other specified disorders of kidney and ureter: Secondary | ICD-10-CM | POA: Diagnosis not present

## 2022-01-01 DIAGNOSIS — F419 Anxiety disorder, unspecified: Secondary | ICD-10-CM | POA: Diagnosis present

## 2022-01-01 DIAGNOSIS — I7121 Aneurysm of the ascending aorta, without rupture: Secondary | ICD-10-CM | POA: Diagnosis not present

## 2022-01-01 DIAGNOSIS — E1159 Type 2 diabetes mellitus with other circulatory complications: Secondary | ICD-10-CM

## 2022-01-01 DIAGNOSIS — R0689 Other abnormalities of breathing: Secondary | ICD-10-CM | POA: Diagnosis not present

## 2022-01-01 DIAGNOSIS — I350 Nonrheumatic aortic (valve) stenosis: Secondary | ICD-10-CM | POA: Diagnosis not present

## 2022-01-01 DIAGNOSIS — Z20822 Contact with and (suspected) exposure to covid-19: Secondary | ICD-10-CM | POA: Diagnosis present

## 2022-01-01 DIAGNOSIS — R000 Tachycardia, unspecified: Secondary | ICD-10-CM | POA: Diagnosis not present

## 2022-01-01 DIAGNOSIS — I2510 Atherosclerotic heart disease of native coronary artery without angina pectoris: Secondary | ICD-10-CM | POA: Diagnosis present

## 2022-01-01 DIAGNOSIS — J40 Bronchitis, not specified as acute or chronic: Secondary | ICD-10-CM | POA: Diagnosis present

## 2022-01-01 DIAGNOSIS — R0602 Shortness of breath: Secondary | ICD-10-CM | POA: Diagnosis not present

## 2022-01-01 DIAGNOSIS — E119 Type 2 diabetes mellitus without complications: Secondary | ICD-10-CM | POA: Diagnosis not present

## 2022-01-01 DIAGNOSIS — E785 Hyperlipidemia, unspecified: Secondary | ICD-10-CM | POA: Diagnosis not present

## 2022-01-01 DIAGNOSIS — Z720 Tobacco use: Secondary | ICD-10-CM | POA: Diagnosis not present

## 2022-01-01 DIAGNOSIS — E669 Obesity, unspecified: Secondary | ICD-10-CM | POA: Diagnosis present

## 2022-01-01 DIAGNOSIS — I10 Essential (primary) hypertension: Secondary | ICD-10-CM | POA: Diagnosis not present

## 2022-01-01 DIAGNOSIS — I77810 Thoracic aortic ectasia: Secondary | ICD-10-CM | POA: Diagnosis present

## 2022-01-01 DIAGNOSIS — Z6834 Body mass index (BMI) 34.0-34.9, adult: Secondary | ICD-10-CM

## 2022-01-01 DIAGNOSIS — Z79899 Other long term (current) drug therapy: Secondary | ICD-10-CM | POA: Diagnosis not present

## 2022-01-01 DIAGNOSIS — Z91030 Bee allergy status: Secondary | ICD-10-CM | POA: Diagnosis not present

## 2022-01-01 DIAGNOSIS — J9601 Acute respiratory failure with hypoxia: Secondary | ICD-10-CM | POA: Diagnosis not present

## 2022-01-01 DIAGNOSIS — R0902 Hypoxemia: Secondary | ICD-10-CM | POA: Diagnosis not present

## 2022-01-01 DIAGNOSIS — Z8249 Family history of ischemic heart disease and other diseases of the circulatory system: Secondary | ICD-10-CM | POA: Diagnosis not present

## 2022-01-01 DIAGNOSIS — E7800 Pure hypercholesterolemia, unspecified: Secondary | ICD-10-CM | POA: Diagnosis not present

## 2022-01-01 DIAGNOSIS — I152 Hypertension secondary to endocrine disorders: Secondary | ICD-10-CM

## 2022-01-01 DIAGNOSIS — J9621 Acute and chronic respiratory failure with hypoxia: Secondary | ICD-10-CM | POA: Diagnosis present

## 2022-01-01 DIAGNOSIS — R062 Wheezing: Secondary | ICD-10-CM | POA: Diagnosis not present

## 2022-01-01 DIAGNOSIS — E1169 Type 2 diabetes mellitus with other specified complication: Secondary | ICD-10-CM

## 2022-01-01 DIAGNOSIS — D49512 Neoplasm of unspecified behavior of left kidney: Secondary | ICD-10-CM | POA: Diagnosis present

## 2022-01-01 LAB — CBC WITH DIFFERENTIAL/PLATELET
Abs Immature Granulocytes: 0.02 10*3/uL (ref 0.00–0.07)
Basophils Absolute: 0.1 10*3/uL (ref 0.0–0.1)
Basophils Relative: 1 %
Eosinophils Absolute: 0.7 10*3/uL — ABNORMAL HIGH (ref 0.0–0.5)
Eosinophils Relative: 8 %
HCT: 51.7 % (ref 39.0–52.0)
Hemoglobin: 17.3 g/dL — ABNORMAL HIGH (ref 13.0–17.0)
Immature Granulocytes: 0 %
Lymphocytes Relative: 27 %
Lymphs Abs: 2.5 10*3/uL (ref 0.7–4.0)
MCH: 31.7 pg (ref 26.0–34.0)
MCHC: 33.5 g/dL (ref 30.0–36.0)
MCV: 94.9 fL (ref 80.0–100.0)
Monocytes Absolute: 0.7 10*3/uL (ref 0.1–1.0)
Monocytes Relative: 7 %
Neutro Abs: 5.1 10*3/uL (ref 1.7–7.7)
Neutrophils Relative %: 57 %
Platelets: 249 10*3/uL (ref 150–400)
RBC: 5.45 MIL/uL (ref 4.22–5.81)
RDW: 12.4 % (ref 11.5–15.5)
WBC: 9 10*3/uL (ref 4.0–10.5)
nRBC: 0 % (ref 0.0–0.2)

## 2022-01-01 LAB — BRAIN NATRIURETIC PEPTIDE: B Natriuretic Peptide: 19.5 pg/mL (ref 0.0–100.0)

## 2022-01-01 LAB — RESP PANEL BY RT-PCR (FLU A&B, COVID) ARPGX2
Influenza A by PCR: NEGATIVE
Influenza B by PCR: NEGATIVE
SARS Coronavirus 2 by RT PCR: NEGATIVE

## 2022-01-01 LAB — BASIC METABOLIC PANEL
Anion gap: 10 (ref 5–15)
BUN: 15 mg/dL (ref 6–20)
CO2: 26 mmol/L (ref 22–32)
Calcium: 9.6 mg/dL (ref 8.9–10.3)
Chloride: 102 mmol/L (ref 98–111)
Creatinine, Ser: 1.16 mg/dL (ref 0.61–1.24)
GFR, Estimated: >60 mL/min (ref >60)
Glucose, Bld: 176 mg/dL — ABNORMAL HIGH (ref 70–99)
Potassium: 4.3 mmol/L (ref 3.5–5.1)
Sodium: 138 mmol/L (ref 135–145)

## 2022-01-01 MED ORDER — BUDESONIDE 0.5 MG/2ML IN SUSP
0.5000 mg | Freq: Two times a day (BID) | RESPIRATORY_TRACT | Status: DC
  Administered 2022-01-01 – 2022-01-04 (×6): 0.5 mg via RESPIRATORY_TRACT
  Filled 2022-01-01 (×8): qty 2

## 2022-01-01 MED ORDER — IOHEXOL 350 MG/ML SOLN
100.0000 mL | Freq: Once | INTRAVENOUS | Status: AC | PRN
  Administered 2022-01-01: 100 mL via INTRAVENOUS

## 2022-01-01 MED ORDER — METHYLPREDNISOLONE SODIUM SUCC 125 MG IJ SOLR
60.0000 mg | Freq: Every day | INTRAMUSCULAR | Status: DC
  Administered 2022-01-02 – 2022-01-03 (×2): 60 mg via INTRAVENOUS
  Filled 2022-01-01 (×2): qty 2

## 2022-01-01 MED ORDER — ENOXAPARIN SODIUM 40 MG/0.4ML IJ SOSY
40.0000 mg | PREFILLED_SYRINGE | INTRAMUSCULAR | Status: DC
  Administered 2022-01-02 – 2022-01-03 (×3): 40 mg via SUBCUTANEOUS
  Filled 2022-01-01 (×3): qty 0.4

## 2022-01-01 MED ORDER — SODIUM CHLORIDE 3 % IN NEBU
4.0000 mL | INHALATION_SOLUTION | Freq: Every day | RESPIRATORY_TRACT | Status: AC
  Administered 2022-01-01 – 2022-01-04 (×3): 4 mL via RESPIRATORY_TRACT
  Filled 2022-01-01 (×3): qty 4

## 2022-01-01 MED ORDER — IPRATROPIUM-ALBUTEROL 0.5-2.5 (3) MG/3ML IN SOLN
3.0000 mL | Freq: Four times a day (QID) | RESPIRATORY_TRACT | Status: DC
  Administered 2022-01-01 – 2022-01-02 (×4): 3 mL via RESPIRATORY_TRACT
  Filled 2022-01-01 (×5): qty 3

## 2022-01-01 MED ORDER — SODIUM CHLORIDE 0.9 % IV BOLUS
500.0000 mL | Freq: Once | INTRAVENOUS | Status: AC
  Administered 2022-01-02: 500 mL via INTRAVENOUS

## 2022-01-01 MED ORDER — INSULIN ASPART 100 UNIT/ML IJ SOLN
0.0000 [IU] | Freq: Three times a day (TID) | INTRAMUSCULAR | Status: DC
  Administered 2022-01-02: 3 [IU] via SUBCUTANEOUS
  Administered 2022-01-02: 5 [IU] via SUBCUTANEOUS
  Administered 2022-01-02: 3 [IU] via SUBCUTANEOUS

## 2022-01-01 NOTE — ED Notes (Signed)
Pt into room, EDP and family at [facility].  ?

## 2022-01-01 NOTE — Assessment & Plan Note (Signed)
Continue home antihypertensives once med rec is complete ?

## 2022-01-01 NOTE — ED Notes (Signed)
Back from xray, alert, NAD, calm.  ?

## 2022-01-01 NOTE — ED Provider Triage Note (Signed)
Emergency Medicine Provider Triage Evaluation Note ? ?Russell Burns , a 56 y.o. male  was evaluated in triage.  Pt complains of SOB that began earlier today. Satting 80% on RA with EMS. Given 125 mg Solumedrol and 2 duonebs however O2 only increased to about 92%. Currently on 2L [place] for comfort.  ? ?Patient reports he was diagnosed with bronchitis.  And since that time has been dealing with some shortness of breath.  He was at work today when he felt like he could not catch his breath which is never happened to him before.  He does mention that he has been having to sleep with a couple of pillows at nighttime.  He states that he recently had a CT scan with findings concerning for left renal carcinoma.  He has not seen a urologist or oncologist yet for same. ? ?Review of Systems  ?Positive: + SOB ?Negative: - chest pain, fevers, chills, hemoptysis, cough ? ?Physical Exam  ?There were no vitals taken for this visit. ?Gen:   Awake, no distress   ?Resp:  Normal effort  ?MSK:   Moves extremities without difficulty  ?Other:  2L [place]. Speaking in full sentences however slightly tachypneic. No wheezing on exam.  ? ?Medical Decision Making  ?Medically screening exam initiated at 2:35 PM.  Appropriate orders placed.  Russell Burns was informed that the remainder of the evaluation will be completed by another provider, this initial triage assessment does not replace that evaluation, and the importance of remaining in the ED until their evaluation is complete. ? ? ?  ?Russell Maize, PA-C ?01/01/22 1446 ? ?

## 2022-01-01 NOTE — Assessment & Plan Note (Signed)
Start sensitive sliding scale insulin ?

## 2022-01-01 NOTE — Assessment & Plan Note (Addendum)
-  Presented with hypoxia down to 88 to 89% requiring 3 L via nasal cannula.  He reports about 4 months of symptoms of nonproductive cough with exertional dyspnea and orthopnea.  CTA of the chest is negative for pulmonary embolism but showed findings suggestive of small airway disease with mucous plugging of the lower lobes.  CTA chest in January  showed airway debris bilaterally concerning for bronchitis versus aspiration.  He has no formal diagnosis of COPD and only has remote history of tobacco use 20 years ago,but reports maternal uncle with COPD due to alpha antitrypsin deficiency. ?-Unclear if this is really COPD. he will need outpatient PFT and if positive for COPD will need work-up for alpha antitrypsin ?-Trial of scheduled duoneb q6hr ?-Trial of nebulized hypertonic saline and inhaled steroid for mucolytic. Could consider Chest PT.  ?-daily IV Solu-Medrol '60mg'$  ? ?-In terms of a cardiac standpoint, pt had recent echo in 09/2021 with EF of 55 to 60% and grade 1 diastolic dysfunction.  Trivial mitral valve regurgitation.  Moderate thickening of the aortic valve with aortic stenosis.  Aortic valve found to be bicuspid on recent CT in February.  He has history of elevated calcium score but had stress test in 09/2020 without any evidence of ischemia and was a low risk study. BNP is low at 19 with no radiological or clinical findings of edema. Will check troponin in case dyspnea is his anginal equivalent.  If symptoms do not improve with pulmonary therapy, could consider consult to cardiology to discuss whether symptoms are related to his moderate aortic stenosis. ? ?

## 2022-01-01 NOTE — ED Triage Notes (Signed)
Patient BIB [facility] from work, episode of shortness of breath today at work, SpO2 in 80s on RA, 92% [place]. 125 solumedrol, 2 duonebs. ?

## 2022-01-01 NOTE — Assessment & Plan Note (Signed)
Continue home meds once med rec is complete ?

## 2022-01-01 NOTE — H&P (Signed)
?History and Physical  ? ? ?Patient: Russell Burns DXI:[id number] DOB: 12-06-1965 ?DOA: 01/01/2022 ?DOS: the patient was seen and examined on 01/01/2022 ?PCP: [name], MD  ?Patient coming from: Home ? ?Chief Complaint:  ?Chief Complaint  ?Patient presents with  ? Shortness of Breath  ? ?HPI: Russell Burns is a 56 y.o. male with medical history significant of dilated aortic root, hypertension, CAD, type 2 diabetes, hyperlipidemia and anxiety who presents with concerns of increasing shortness of breath. ? ?Patient reports that back in January he was diagnosed with bronchitis but continued have persistent symptoms of nonproductive cough and shortness of breath on exertion and when attempting to lay flat.  Has underwent treatment with oral steroid and about 3 course of antibiotics with last treatment in mid March.  He denies any symptoms of chest pain or lower extremity edema.  Has used albuterol in the past few months with relief however today became increasingly short of breath while ambulating at work and used his albuterol multiple times without relief and found his O2 to be down t low 80s and decided to present to the ED. ? ?In the ED, he was noted to be hypoxic down to 88 to 89% and was placed on 3 L.  He was afebrile with mildly elevated blood pressure systolic 767H.  No leukocytosis.  Hemoglobin of 17.3.  BMP unremarkable.  BNP of 19. ?Negative flu and COVID PCR ? ?CTA chest was negative for pulmonary embolism with stable 4.6 cm ascending aortic aneurysm without dissection or acute aortic finding.  There is bronchial thickening with area of mucous plugging in the lower lobes suggesting small airway disease. ? ?He has remote history of tobacco use for about 2 weeks but quit 20 years ago.  However maternal uncle has history of COPD due to alpha antitrypsin deficiency. ? ?He received DuoNeb and IV Solu-Medrol on route with EMS and reports improvement in his symptoms. ? ?Review of Systems: As mentioned in  the history of present illness. All other systems reviewed and are negative. ?Past Medical History:  ?Diagnosis Date  ? Allergy   ? Blood transfusion without reported diagnosis   ? Diabetes mellitus without complication ([place])   ? Hyperlipidemia   ? Hypertension   ? Obesity   ? ?Past Surgical History:  ?Procedure Laterality Date  ? TONSILLECTOMY    ? WISDOM TOOTH EXTRACTION    ? ?Social History:  reports that he has never smoked. He has quit using smokeless tobacco.  His smokeless tobacco use included chew. He reports current alcohol use. He reports that he does not use drugs. ? ?Allergies  ?Allergen Reactions  ? Bee Venom Anaphylaxis  ? ? ?Family History  ?Problem Relation Age of Onset  ? CAD Other   ? Diabetes Mellitus II Other   ? Hypertension Other   ? Heart failure Other   ? Hypertension Mother   ? Heart attack Father   ?     41, 29 at both ages  ? Diabetes Mellitus II Father   ?     insulin dependent  ? Aneurysm Maternal Uncle   ?     aortic  ? Heart attack Paternal Uncle 11  ?     minor  ? Hypertension Paternal Uncle   ? Heart attack Maternal Grandmother 82  ?     massive  ? Aneurysm Maternal Grandfather   ?     abdominal  ? Heart attack Paternal Grandfather 54  ? Aneurysm Cousin 44  ?  aortic  ? Colon polyps Neg Hx   ? Esophageal cancer Neg Hx   ? Rectal cancer Neg Hx   ? Stomach cancer Neg Hx   ? ? ?Prior to Admission medications   ?Medication Sig Start Date End Date Taking? Authorizing Provider  ?Alirocumab (PRALUENT) 150 MG/ML SOAJ Inject 150 mLs into the skin. One shot on the 10th and the second on the 25th    [provider]  ?amLODipine (NORVASC) 10 MG tablet Take 10 mg by mouth daily. 08/01/20   [provider]  ?aspirin 81 MG chewable tablet Chew 81 mg by mouth daily.    [provider]  ?atorvastatin (LIPITOR) 80 MG tablet Take 80 mg by mouth daily.    [provider]  ?cetirizine (ZYRTEC) 10 MG tablet Take 10 mg by mouth daily as needed for allergies.     [provider]  ?Dulaglutide 0.75 MG/0.5ML SOPN Inject into the skin. Use as directed    [provider]  ?EPINEPHrine 0.3 mg/0.3 mL IJ SOAJ injection Inject 0.3 mg into the muscle as needed.    [provider]  ?ezetimibe (ZETIA) 10 MG tablet Take 10 mg by mouth daily. 08/01/20   [provider]  ?glimepiride (AMARYL) 1 MG tablet Take 1 mg by mouth daily with breakfast.    [provider]  ?JARDIANCE 25 MG TABS tablet Take 25 mg by mouth daily. 07/10/20   [provider]  ?losartan (COZAAR) 100 MG tablet Take 100 mg by mouth daily.    [provider]  ?metFORMIN (GLUCOPHAGE) 1000 MG tablet Take 1,000 mg by mouth 2 (two) times daily with a meal.    [provider]  ?metoprolol tartrate (LOPRESSOR) 100 MG tablet Take 1 tablet ('100mg'$ ) TWO hours prior to your cardiac CT scan. 10/25/21   Jerline Pain, MD  ?sildenafil (VIAGRA) 100 MG tablet Take 100 mg by mouth daily as needed for erectile dysfunction.    [provider]  ?tirzepatide Darcel Bayley) 10 MG/0.5ML Pen Inject 10 mg into the skin once a week. 11/14/21     ? ? ?Physical Exam: ?Vitals:  ? 01/01/22 1900 01/01/22 1915 01/01/22 2045 01/01/22 2100  ?BP: (!) 155/88 (!) 150/92 (!) 157/96 (!) 136/97  ?Pulse: 87 89 92 91  ?Resp: (!) '25 15 18 '$ (!) 25  ?Temp:      ?TempSrc:      ?SpO2: 91% 90% 91% 92%  ? ?Constitutional: NAD, calm, comfortable, nontoxic appearing male sitting upright in bed ?Eyes:  lids and conjunctivae normal ?ENMT: Mucous membranes are moist.  ?Neck: normal, supple ?Respiratory: Faint crackles to right upper lobe but no wheezing.  Normal respiratory effort on 3 L nasal cannula.  Able to speak in full sentences no accessory muscle use.  ?Cardiovascular: Regular rate and rhythm, no murmurs / rubs / gallops. No extremity edema.   ?Abdomen: no tenderness, Bowel sounds positive.  ?Musculoskeletal: no clubbing / cyanosis. No joint deformity upper and lower extremities. Good ROM, no  contractures. Normal muscle tone.  ?Skin: no rashes, lesions, ulcers. No induration ?Neurologic: CN 2-12 grossly intact.Strength 5/5 in all 4.  ?Psychiatric: Normal judgment and insight. Alert and oriented x 3. Normal mood. ?Data Reviewed: ? ?See HPI ? ?Assessment and Plan: ?* Acute respiratory failure with hypoxia (HCC) ?-Presented with hypoxia down to 88 to 89% requiring 3 L via nasal cannula.  He reports about 4 months of symptoms of nonproductive cough with exertional dyspnea and orthopnea.  CTA of the chest  is negative for pulmonary embolism but showed findings suggestive of small airway disease with mucous plugging of the lower lobes.  CTA chest in January  showed airway debris bilaterally concerning for bronchitis versus aspiration.  He has no formal diagnosis of COPD and only has remote history of tobacco use 20 years ago,but reports maternal uncle with COPD due to alpha antitrypsin deficiency. ?-Unclear if this is really COPD. he will need outpatient PFT and if positive for COPD will need work-up for alpha antitrypsin ?-Trial of scheduled duoneb q6hr ?-Trial of nebulized hypertonic saline and inhaled steroid for mucolytic. Could consider Chest PT.  ?-daily IV Solu-Medrol '60mg'$  ? ?-In terms of a cardiac standpoint, pt had recent echo in 09/2021 with EF of 55 to 60% and grade 1 diastolic dysfunction.  Trivial mitral valve regurgitation.  Moderate thickening of the aortic valve with aortic stenosis.  Aortic valve found to be bicuspid on recent CT in February.  He has history of elevated calcium score but had stress test in 09/2020 without any evidence of ischemia and was a low risk study. BNP is low at 19 with no radiological or clinical findings of edema. Will check troponin in case dyspnea is his anginal equivalent.  If symptoms do not improve with pulmonary therapy, could consider consult to cardiology to discuss whether symptoms are related to his moderate aortic stenosis. ? ? ?Left renal mass ?Incidental  finding on CT imaging back in January.  Reports planned follow-up with urology outpatient. ? ?HLD (hyperlipidemia) ?Continue home meds once med rec is complete ? ?Thoracic aortic aneurysm ([place]) ?Measuring at 4.6 c

## 2022-01-01 NOTE — ED Notes (Signed)
PT ambulatory to bathroom with no noted difficulties, nor is PT endorsing any difficulties. PT O2 saturation on room air 96% in the beginning of ambulation, but dropped to 88% by the end of ambulation. PT placed back on monitor and 2LPM [place] at this time. ?

## 2022-01-01 NOTE — ED Notes (Signed)
Pt not in room.

## 2022-01-01 NOTE — ED Notes (Signed)
CT notified pt ready, no changes, alert, NAD, calm, interactive, family at [facility] x2.  ?

## 2022-01-01 NOTE — Assessment & Plan Note (Signed)
Incidental finding on CT imaging back in January.  Reports planned follow-up with urology outpatient. ?

## 2022-01-01 NOTE — Assessment & Plan Note (Signed)
Measuring at 4.6 cm without dissection or acute aortic findings today on CT chest ?-He is followed by cardiology for routine surveillance.  Recent seen by geneticist due to strong family history of aneurysm and did not have mutation in major genes implicated in arthopathies.  ?

## 2022-01-01 NOTE — ED Provider Notes (Signed)
?[facility] ?Provider Note ? ? ?CSN: [id number] ?Arrival date & time: 01/01/22  1431 ? ?  ? ?History ? ?Chief Complaint  ?Patient presents with  ? Shortness of Breath  ? ? ?Russell Burns is a 56 y.o. male.  He is here with a complaint of severe shortness of breath dyspnea on exertion orthopnea that is been going on since January.  He has seen his primary care doctor and has been on multiple courses of antibiotics steroids and inhaler with minimal improvement.  It acutely worsened today while walking at work.  Nonproductive cough.  Initially saturations were in the low 80s improved with oxygen breathing treatments steroids.  He said he was audibly wheezing.  Non-smoker.  No prior pulmonary complications.  Did have COVID a few years ago.  Diabetic.  No chest pain.  No leg swelling no recent travel.  History of thoracic aneurysm.  CT in January showed airway debris in the lower lobes worse on the left.  Bronchitis versus aspiration.  Recent CT about a week ago for renal mass showing benign versus malignant renal neoplasm.  Lung bases were clear at that time. ? ? ? ?I given the morning ultrahigh ? ?The history is provided by the patient.  ?Shortness of Breath ?Severity:  Severe ?Onset quality:  Sudden ?Duration:  2 hours ?Timing:  Constant ?Progression:  Improving ?Chronicity:  New ?Context: activity   ?Relieved by:  Nothing ?Worsened by:  Activity and coughing ?Ineffective treatments: Nebulized treatments oxygen. ?Associated symptoms: cough and wheezing   ?Associated symptoms: no abdominal pain, no chest pain, no fever, no headaches, no hemoptysis, no rash, no sore throat and no sputum production   ?Risk factors: no tobacco use   ? ?  ? ?Home Medications ?Prior to Admission medications   ?Medication Sig Start Date End Date Taking? Authorizing Provider  ?Alirocumab (PRALUENT) 150 MG/ML SOAJ Inject 150 mLs into the skin. One shot on the 10th and the second on the 25th     [provider]  ?amLODipine (NORVASC) 10 MG tablet Take 10 mg by mouth daily. 08/01/20   [provider]  ?aspirin 81 MG chewable tablet Chew 81 mg by mouth daily.    [provider]  ?atorvastatin (LIPITOR) 80 MG tablet Take 80 mg by mouth daily.    [provider]  ?cetirizine (ZYRTEC) 10 MG tablet Take 10 mg by mouth daily as needed for allergies.    [provider]  ?Dulaglutide 0.75 MG/0.5ML SOPN Inject into the skin. Use as directed    [provider]  ?EPINEPHrine 0.3 mg/0.3 mL IJ SOAJ injection Inject 0.3 mg into the muscle as needed.    [provider]  ?ezetimibe (ZETIA) 10 MG tablet Take 10 mg by mouth daily. 08/01/20   [provider]  ?glimepiride (AMARYL) 1 MG tablet Take 1 mg by mouth daily with breakfast.    [provider]  ?JARDIANCE 25 MG TABS tablet Take 25 mg by mouth daily. 07/10/20   [provider]  ?losartan (COZAAR) 100 MG tablet Take 100 mg by mouth daily.    [provider]  ?metFORMIN (GLUCOPHAGE) 1000 MG tablet Take 1,000 mg by mouth 2 (two) times daily with a meal.    [provider]  ?metoprolol tartrate (LOPRESSOR) 100 MG tablet Take 1 tablet ('100mg'$ ) TWO hours prior to your cardiac CT scan. 10/25/21   Jerline Pain, MD  ?sildenafil (VIAGRA) 100 MG tablet Take 100 mg by  mouth daily as needed for erectile dysfunction.    [provider]  ?tirzepatide Darcel Bayley) 10 MG/0.5ML Pen Inject 10 mg into the skin once a week. 11/14/21     ?   ? ?Allergies    ?Bee venom   ? ?Review of Systems   ?Review of Systems  ?Constitutional:  Negative for fever.  ?HENT:  Negative for sore throat.   ?Eyes:  Negative for visual disturbance.  ?Respiratory:  Positive for cough, shortness of breath and wheezing. Negative for hemoptysis and sputum production.   ?Cardiovascular:  Negative for chest pain.  ?Gastrointestinal:  Negative for abdominal pain.  ?Genitourinary:  Negative for dysuria.  ?Skin:   Negative for rash.  ?Neurological:  Negative for headaches.  ? ?Physical Exam ?Updated Vital Signs ?BP 132/87   Pulse 90   Temp 97.9 ?F (36.6 ?C)   Resp (!) 22   SpO2 98%  ?Physical Exam ?Vitals and nursing note reviewed.  ?Constitutional:   ?   General: He is not in acute distress. ?   Appearance: He is well-developed.  ?HENT:  ?   Head: Normocephalic and atraumatic.  ?Eyes:  ?   Conjunctiva/sclera: Conjunctivae normal.  ?Cardiovascular:  ?   Rate and Rhythm: Normal rate and regular rhythm.  ?   Heart sounds: No murmur heard. ?Pulmonary:  ?   Effort: Pulmonary effort is normal. No respiratory distress.  ?   Breath sounds: Normal breath sounds.  ?Abdominal:  ?   Palpations: Abdomen is soft.  ?   Tenderness: There is no abdominal tenderness.  ?Musculoskeletal:     ?   General: No swelling.  ?   Cervical back: Neck supple.  ?   Right lower leg: No tenderness. No edema.  ?   Left lower leg: No tenderness. No edema.  ?Skin: ?   General: Skin is warm and dry.  ?   Capillary Refill: Capillary refill takes less than 2 seconds.  ?Neurological:  ?   General: No focal deficit present.  ?   Mental Status: He is alert.  ? ? ?ED Results / Procedures / Treatments   ?Labs ?(all labs ordered are listed, but only abnormal results are displayed) ?Labs Reviewed  ?BASIC METABOLIC PANEL - Abnormal; Notable for the following components:  ?    Result Value  ? Glucose, Bld 176 (*)   ? All other components within normal limits  ?CBC WITH DIFFERENTIAL/PLATELET - Abnormal; Notable for the following components:  ? Hemoglobin 17.3 (*)   ? Eosinophils Absolute 0.7 (*)   ? All other components within normal limits  ?CBC - Abnormal; Notable for the following components:  ? WBC 11.8 (*)   ? Hemoglobin 17.1 (*)   ? All other components within normal limits  ?HEMOGLOBIN A1C - Abnormal; Notable for the following components:  ? Hgb A1c MFr Bld 7.0 (*)   ? All other components within normal limits  ?CBG MONITORING, ED - Abnormal; Notable for the  following components:  ? Glucose-Capillary 221 (*)   ? All other components within normal limits  ?CBG MONITORING, ED - Abnormal; Notable for the following components:  ? Glucose-Capillary 206 (*)   ? All other components within normal limits  ?CBG MONITORING, ED - Abnormal; Notable for the following components:  ? Glucose-Capillary 259 (*)   ? All other components within normal limits  ?RESP PANEL BY RT-PCR (FLU A&B, COVID) ARPGX2  ?RESPIRATORY PANEL BY PCR  ?BRAIN NATRIURETIC PEPTIDE  ?HIV ANTIBODY (ROUTINE TESTING W  REFLEX)  ?STREP PNEUMONIAE URINARY ANTIGEN  ?ALPHA-1-ANTITRYPSIN  ?LEGIONELLA PNEUMOPHILA SEROGP 1 UR AG  ?COMPREHENSIVE METABOLIC PANEL  ?MAGNESIUM  ?PHOSPHORUS  ?CBC WITH DIFFERENTIAL/PLATELET  ?TROPONIN I (HIGH SENSITIVITY)  ?TROPONIN I (HIGH SENSITIVITY)  ?TROPONIN I (HIGH SENSITIVITY)  ? ? ?EKG ?EKG Interpretation ? ?Date/Time:  Monday January 01 2022 14:50:42 EDT ?Ventricular Rate:  89 ?PR Interval:  160 ?QRS Duration: 96 ?QT Interval:  362 ?QTC Calculation: 440 ?R Axis:   -34 ?Text Interpretation: Normal sinus rhythm Left axis deviation Possible Anterolateral infarct , age undetermined Abnormal ECG No previous ECGs available Confirmed by Aletta Edouard [phone]) on 01/01/2022 3:46:25 PM ? ?Radiology ?DG Chest 2 View ? ?Result Date: 01/01/2022 ?CLINICAL DATA:  Shortness of breath. EXAM: CHEST - 2 VIEW COMPARISON:  Chest CT 10/20/2021 FINDINGS: The cardiomediastinal contours are normal. The lungs are clear. Pulmonary vasculature is normal. No consolidation, pleural effusion, or pneumothorax. No acute osseous abnormalities are seen. IMPRESSION: No acute chest findings. Electronically Signed   By: Keith Rake M.D.   On: 01/01/2022 16:02  ? ?CT Angio Chest PE W/Cm &/Or Wo Cm ? ?Result Date: 01/01/2022 ?CLINICAL DATA:  Pulmonary embolism (PE) suspected, high prob Shortness of breath and hypoxia. EXAM: CT ANGIOGRAPHY CHEST WITH CONTRAST TECHNIQUE: Multidetector CT imaging of the chest was performed  using the standard protocol during bolus administration of intravenous contrast. Multiplanar CT image reconstructions and MIPs were obtained to evaluate the vascular anatomy. RADIATION DOSE REDUCTION: This exam was per

## 2022-01-02 ENCOUNTER — Encounter (HOSPITAL_COMMUNITY): Payer: Self-pay | Admitting: Family Medicine

## 2022-01-02 DIAGNOSIS — D49512 Neoplasm of unspecified behavior of left kidney: Secondary | ICD-10-CM | POA: Diagnosis present

## 2022-01-02 DIAGNOSIS — Z6834 Body mass index (BMI) 34.0-34.9, adult: Secondary | ICD-10-CM | POA: Diagnosis not present

## 2022-01-02 DIAGNOSIS — J9601 Acute respiratory failure with hypoxia: Principal | ICD-10-CM

## 2022-01-02 DIAGNOSIS — I110 Hypertensive heart disease with heart failure: Secondary | ICD-10-CM | POA: Diagnosis present

## 2022-01-02 DIAGNOSIS — E785 Hyperlipidemia, unspecified: Secondary | ICD-10-CM | POA: Diagnosis present

## 2022-01-02 DIAGNOSIS — Z720 Tobacco use: Secondary | ICD-10-CM | POA: Diagnosis not present

## 2022-01-02 DIAGNOSIS — Z7982 Long term (current) use of aspirin: Secondary | ICD-10-CM | POA: Diagnosis not present

## 2022-01-02 DIAGNOSIS — I10 Essential (primary) hypertension: Secondary | ICD-10-CM | POA: Diagnosis not present

## 2022-01-02 DIAGNOSIS — F419 Anxiety disorder, unspecified: Secondary | ICD-10-CM | POA: Diagnosis present

## 2022-01-02 DIAGNOSIS — I712 Thoracic aortic aneurysm, without rupture, unspecified: Secondary | ICD-10-CM

## 2022-01-02 DIAGNOSIS — Z8249 Family history of ischemic heart disease and other diseases of the circulatory system: Secondary | ICD-10-CM | POA: Diagnosis not present

## 2022-01-02 DIAGNOSIS — Z833 Family history of diabetes mellitus: Secondary | ICD-10-CM | POA: Diagnosis not present

## 2022-01-02 DIAGNOSIS — Z20822 Contact with and (suspected) exposure to covid-19: Secondary | ICD-10-CM | POA: Diagnosis present

## 2022-01-02 DIAGNOSIS — Z79899 Other long term (current) drug therapy: Secondary | ICD-10-CM | POA: Diagnosis not present

## 2022-01-02 DIAGNOSIS — J40 Bronchitis, not specified as acute or chronic: Secondary | ICD-10-CM | POA: Diagnosis present

## 2022-01-02 DIAGNOSIS — Z825 Family history of asthma and other chronic lower respiratory diseases: Secondary | ICD-10-CM | POA: Diagnosis not present

## 2022-01-02 DIAGNOSIS — I77810 Thoracic aortic ectasia: Secondary | ICD-10-CM | POA: Diagnosis present

## 2022-01-02 DIAGNOSIS — I5032 Chronic diastolic (congestive) heart failure: Secondary | ICD-10-CM | POA: Diagnosis not present

## 2022-01-02 DIAGNOSIS — N2889 Other specified disorders of kidney and ureter: Secondary | ICD-10-CM

## 2022-01-02 DIAGNOSIS — Z91030 Bee allergy status: Secondary | ICD-10-CM | POA: Diagnosis not present

## 2022-01-02 DIAGNOSIS — I2510 Atherosclerotic heart disease of native coronary artery without angina pectoris: Secondary | ICD-10-CM | POA: Diagnosis present

## 2022-01-02 DIAGNOSIS — E669 Obesity, unspecified: Secondary | ICD-10-CM | POA: Diagnosis present

## 2022-01-02 DIAGNOSIS — Z7984 Long term (current) use of oral hypoglycemic drugs: Secondary | ICD-10-CM | POA: Diagnosis not present

## 2022-01-02 DIAGNOSIS — E119 Type 2 diabetes mellitus without complications: Secondary | ICD-10-CM | POA: Diagnosis not present

## 2022-01-02 DIAGNOSIS — E7800 Pure hypercholesterolemia, unspecified: Secondary | ICD-10-CM

## 2022-01-02 DIAGNOSIS — I350 Nonrheumatic aortic (valve) stenosis: Secondary | ICD-10-CM | POA: Diagnosis present

## 2022-01-02 LAB — RESPIRATORY PANEL BY PCR

## 2022-01-02 LAB — CBC
HCT: 50.1 % (ref 39.0–52.0)
Hemoglobin: 17.1 g/dL — ABNORMAL HIGH (ref 13.0–17.0)
MCH: 32.1 pg (ref 26.0–34.0)
MCHC: 34.1 g/dL (ref 30.0–36.0)
MCV: 94 fL (ref 80.0–100.0)
Platelets: 254 10*3/uL (ref 150–400)
RBC: 5.33 MIL/uL (ref 4.22–5.81)
RDW: 12.4 % (ref 11.5–15.5)
WBC: 11.8 10*3/uL — ABNORMAL HIGH (ref 4.0–10.5)
nRBC: 0 % (ref 0.0–0.2)

## 2022-01-02 LAB — GLUCOSE, CAPILLARY: Glucose-Capillary: 191 mg/dL — ABNORMAL HIGH (ref 70–99)

## 2022-01-02 LAB — TROPONIN I (HIGH SENSITIVITY): Troponin I (High Sensitivity): 7 ng/L (ref <18)

## 2022-01-02 LAB — CBG MONITORING, ED
Glucose-Capillary: 221 mg/dL — ABNORMAL HIGH (ref 70–99)
Glucose-Capillary: 206 mg/dL — ABNORMAL HIGH (ref 70–99)
Glucose-Capillary: 259 mg/dL — ABNORMAL HIGH (ref 70–99)

## 2022-01-02 LAB — HEMOGLOBIN A1C
Hgb A1c MFr Bld: 7 % — ABNORMAL HIGH (ref 4.8–5.6)
Mean Plasma Glucose: 154.2 mg/dL

## 2022-01-02 LAB — STREP PNEUMONIAE URINARY ANTIGEN: Strep Pneumo Urinary Antigen: NEGATIVE

## 2022-01-02 LAB — HIV ANTIBODY (ROUTINE TESTING W REFLEX): HIV Screen 4th Generation wRfx: NONREACTIVE

## 2022-01-02 MED ORDER — LOSARTAN POTASSIUM 50 MG PO TABS
100.0000 mg | ORAL_TABLET | Freq: Every day | ORAL | Status: DC
  Administered 2022-01-02 – 2022-01-04 (×3): 100 mg via ORAL
  Filled 2022-01-02 (×3): qty 2

## 2022-01-02 MED ORDER — HYDRALAZINE HCL 20 MG/ML IJ SOLN: 5.0000 mg | INTRAMUSCULAR | Status: DC | PRN

## 2022-01-02 MED ORDER — IPRATROPIUM-ALBUTEROL 0.5-2.5 (3) MG/3ML IN SOLN
3.0000 mL | Freq: Four times a day (QID) | RESPIRATORY_TRACT | Status: DC
  Administered 2022-01-03 – 2022-01-04 (×5): 3 mL via RESPIRATORY_TRACT
  Filled 2022-01-02 (×5): qty 3

## 2022-01-02 MED ORDER — IPRATROPIUM-ALBUTEROL 0.5-2.5 (3) MG/3ML IN SOLN
3.0000 mL | RESPIRATORY_TRACT | Status: DC | PRN
  Administered 2022-01-02: 3 mL via RESPIRATORY_TRACT

## 2022-01-02 MED ORDER — AMLODIPINE BESYLATE 10 MG PO TABS
10.0000 mg | ORAL_TABLET | Freq: Every day | ORAL | Status: DC
  Administered 2022-01-02 – 2022-01-04 (×3): 10 mg via ORAL
  Filled 2022-01-02: qty 1
  Filled 2022-01-02: qty 2
  Filled 2022-01-02: qty 1

## 2022-01-02 MED ORDER — INSULIN ASPART 100 UNIT/ML IJ SOLN
0.0000 [IU] | INTRAMUSCULAR | Status: DC
  Administered 2022-01-03: 11 [IU] via SUBCUTANEOUS
  Administered 2022-01-03 (×2): 3 [IU] via SUBCUTANEOUS
  Administered 2022-01-03: 2 [IU] via SUBCUTANEOUS
  Administered 2022-01-03: 5 [IU] via SUBCUTANEOUS
  Administered 2022-01-03: 2 [IU] via SUBCUTANEOUS
  Administered 2022-01-03: 8 [IU] via SUBCUTANEOUS
  Administered 2022-01-04: 2 [IU] via SUBCUTANEOUS
  Administered 2022-01-04: 3 [IU] via SUBCUTANEOUS

## 2022-01-02 NOTE — ED Notes (Signed)
Breakfast order placed ?

## 2022-01-02 NOTE — Plan of Care (Signed)

## 2022-01-02 NOTE — Progress Notes (Signed)
?PROGRESS NOTE ? ? ? ?Russell Burns  YJE:[id number] DOB: 04/17/1966 DOA: 01/01/2022 ?PCP: [name], MD  ? ?Brief Narrative:  ?56 y.o. WM PMHx Anxiety, dilated aortic root, HTN, CAD, DM type II, HLD,   ? ?Presents with concerns of increasing shortness of breath. ?  ?Patient reports that back in January he was diagnosed with bronchitis but continued have persistent symptoms of nonproductive cough and shortness of breath on exertion and when attempting to lay flat.  Has underwent treatment with oral steroid and about 3 course of antibiotics with last treatment in mid March.  He denies any symptoms of chest pain or lower extremity edema.  Has used albuterol in the past few months with relief however today became increasingly short of breath while ambulating at work and used his albuterol multiple times without relief and found his O2 to be down t low 80s and decided to present to the ED. ?  ?In the ED, he was noted to be hypoxic down to 88 to 89% and was placed on 3 L.  He was afebrile with mildly elevated blood pressure systolic 637C.  No leukocytosis.  Hemoglobin of 17.3.  BMP unremarkable.  BNP of 19. ?Negative flu and COVID PCR ?  ?CTA chest was negative for pulmonary embolism with stable 4.6 cm ascending aortic aneurysm without dissection or acute aortic finding.  There is bronchial thickening with area of mucous plugging in the lower lobes suggesting small airway disease. ?  ?He has remote history of tobacco use for about 2 weeks but quit 20 years ago.  However maternal uncle has history of COPD due to alpha antitrypsin deficiency. ?  ?He received DuoNeb and IV Solu-Medrol on route with EMS and reports improvement in his symptoms. ? ? ?Subjective: ?Afebrile overnight.  Patient has been on at least 3 courses of antibiotics without resolution of symptoms.  In fact now patient requiring 3 L O2 via [place] to maintain adequate SPO2.  Patient works in a [employer] however after a long discussion with patient  concerning their safety protocols EXTREMELY UNLIKELY any exposure. ? ? ?Assessment & Plan: ? Covid vaccination; ? ?Principal Problem: ?  Acute respiratory failure with hypoxia ([place]) ?Active Problems: ?  Diabetes mellitus without complication ([place]) ?  Essential hypertension ?  Thoracic aortic aneurysm ([place]) ?  HLD (hyperlipidemia) ?  Left renal mass ?  Chronic diastolic CHF (congestive heart failure) ([place]) ? ?Acute respiratory failure with hypoxia ([place]) ?-Presented with hypoxia down to 88 to 89% requiring 3 L via nasal cannula.  He reports about 4 months of symptoms of nonproductive cough with exertional dyspnea and orthopnea.  CTA of the chest is negative for pulmonary embolism but showed findings suggestive of small airway disease with mucous plugging of the lower lobes.  CTA chest in January  showed airway debris bilaterally concerning for bronchitis versus aspiration.  He has no formal diagnosis of COPD and only has remote history of tobacco use 20 years ago,but reports ?-maternal uncle with COPD due to alpha antitrypsin deficiency. ?-4/11 Alpha-1 antitrypsin pending ?-Unclear if this is really COPD. he will need outpatient PFT   ?-DuoNeb QID ?-Trial of nebulized hypertonic saline and inhaled steroid for mucolytic.  ?-4/11 chest physiotherapy TID.  ?-daily IV Solu-Medrol '60mg'$  ?   ?Left Renal mass/suspicious cyst of the upper pole RIGHT kidney ?-4/11 review of CT scanning of abdomen 4/4 shows mass in LEFT kidney consistent with malignancy, and suspicious mass in right kidney. ?- 4/11 consult Urology and Surgery  in the [facility] appears that LEFT kidney will need to be resected at a minimum. . ?  ?HLD (hyperlipidemia) ?Continue home meds once med rec is complete ?  ?Thoracic aortic aneurysm ([place]) ?-Stable 4.6 cm ascending aortic aneurysm.  See CTA PE protocol below ?-4/11 patient never seen cardiothoracic surgery, and has SIGNIFICANT family history of ruptured AAA in his family. ?- 4/11 consult Cardiothoracic Surgery in  [facility] although under the guidelines of 5 cm for intervention given his family history should they intervene early? ? ?Chronic diastolic CHF/Moderate AS ?-Echo in 09/2021 with EF of 55 to 60% and grade 1 diastolic dysfunction. ?- If symptoms do not improve with pulmonary therapy, could consider consult to cardiology to discuss whether symptoms are related to his moderate aortic stenosis. ?-Trending troponins ? ?Essential hypertension ?-BP goal <120/80 if patient can tolerate, secondary to AAA. ?- Amlodipine 10 mg daily ?- Losartan 100 mg daily ?-Hydralazine PRN SBP> 130 or DBP> 90 ?  ?Diabetes mellitus without complication ([place]) ?-6/14 increase moderate SSI  ? ?Obese (BMI 34.4 kg/m?) ? ? ? ?Mobility Assessment (last 72 hours)   ? ? Mobility Assessment   ?No documentation. ? ?  ?  ? ?  ? ? ?DVT prophylaxis: Lovenox ?Code Status:  ?Family Communication:  ?Status is: Inpatient ? ? ? ?Dispo: The patient is from:  ?             Anticipated d/c is to:  ?             Anticipated d/c date is:  ?             Patient currently  ? ? ? ? ? ?Consultants:  ? ? ?Procedures/Significant Events:  ?4/4 CT abdomen W/W0 contrast,Enhancing lobular mass partially exophytic from the lower pole of ?the LEFT kidney. Mass extends into the LEFT renal hilum and appears ?to involve the LEFT renal collecting system. 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 Recommend ?urology consultation. ?2. Mass contained in the pararenal fascia. The LEFT renal vein ?appears uninvolved. ?3. No retroperitoneal lymphadenopathy. ?4. Benign Bosniak 1 renal cyst of the upper pole RIGHT kidney ?(lesion of concern on comparison CT.) ?4/10 CTA PE protocol: ?No pulmonary embolus. ?2. Bronchial thickening with areas of mucous plugging in the lower ?lobes. Heterogeneous pulmonary parenchyma, suggesting small airways ?disease. ?3. Stable 4.6 cm ascending aortic aneurysm. No dissection or acute ?aortic findings. Ascending thoracic aortic  aneurysm. Recommend ?semi-annual imaging followup by CTA or MRA and referral to ?cardiothoracic surgery if not already obtained ? ?I have personally reviewed and interpreted all radiology studies and my findings are as above. ? ?VENTILATOR SETTINGS: ?Nasal cannula 4/11 ?Flow 3 L/min ?SPO2= 94% ? ? ?Cultures ?4/10 influenza A/B negative ?4/10 SARS coronavirus negative ?4/11 respiratory virus panel pending ?4/11 strep pneumo urine antigen pending ?4/11 Legionella urine antigen pending ? ? ? ?Antimicrobials: ?Anti-infectives (From admission, onward)  ? ? None  ? ?  ?  ? ? ?Devices ?  ? ?LINES / TUBES:  ? ? ? ? ?Continuous Infusions: ? ? ?Objective: ?Vitals:  ? 01/02/22 0400 01/02/22 0500 01/02/22 0700 01/02/22 0745  ?BP: 133/79 122/87 123/85 130/82  ?Pulse: 98 94 83 85  ?Resp: 13 14 (!) 21 18  ?Temp: 98.2 ?F (36.8 ?C)     ?TempSrc:      ?SpO2: 91% 91% 93% 94%  ?Weight:      ?Height:      ? ? ?Intake/Output Summary (Last 24 hours) at 01/02/2022  0811 ?Last data filed at 01/02/2022 0338 ?Gross per 24 hour  ?Intake 500 ml  ?Output 1120 ml  ?Net -620 ml  ? ?Filed Weights  ? 01/02/22 0330  ?Weight: 108.9 kg  ? ? ?Examination: ? ?General: A/O x4, positive acute respiratory distress ?Eyes: negative scleral hemorrhage, negative anisocoria, negative icterus ?ENT: Negative Runny nose, negative gingival bleeding, ?Neck:  Negative scars, masses, torticollis, lymphadenopathy, JVD ?Lungs: decreased breath sounds bilaterally without wheezes or crackles ?Cardiovascular: Regular rate and rhythm without murmur gallop or rub normal S1 and S2 ?Abdomen: OBESE, negative abdominal pain, nondistended, positive soft, bowel sounds, no rebound, no ascites, no appreciable mass ?Extremities: No significant cyanosis, clubbing, or edema bilateral lower extremities ?Skin: Negative rashes, lesions, ulcers ?Psychiatric:  Negative depression, negative anxiety, negative fatigue, negative mania  ?Central nervous system:  Cranial nerves II through XII intact,  tongue/uvula midline, all extremities muscle strength 5/5, sensation intact throughout, negative dysarthria, negative expressive aphasia, negative receptive aphasia. ? ?.  ? ? ? ?Data Reviewed: Care duri

## 2022-01-03 DIAGNOSIS — I10 Essential (primary) hypertension: Secondary | ICD-10-CM | POA: Diagnosis not present

## 2022-01-03 DIAGNOSIS — J9601 Acute respiratory failure with hypoxia: Secondary | ICD-10-CM | POA: Diagnosis not present

## 2022-01-03 DIAGNOSIS — E119 Type 2 diabetes mellitus without complications: Secondary | ICD-10-CM | POA: Diagnosis not present

## 2022-01-03 DIAGNOSIS — I5032 Chronic diastolic (congestive) heart failure: Secondary | ICD-10-CM | POA: Diagnosis not present

## 2022-01-03 LAB — COMPREHENSIVE METABOLIC PANEL
ALT: 18 U/L (ref 0–44)
AST: 17 U/L (ref 15–41)
Albumin: 4.5 g/dL (ref 3.5–5.0)
Alkaline Phosphatase: 49 U/L (ref 38–126)
Anion gap: 7 (ref 5–15)
BUN: 17 mg/dL (ref 6–20)
CO2: 29 mmol/L (ref 22–32)
Calcium: 9.6 mg/dL (ref 8.9–10.3)
Chloride: 104 mmol/L (ref 98–111)
Creatinine, Ser: 0.84 mg/dL (ref 0.61–1.24)
GFR, Estimated: >60 mL/min (ref >60)
Glucose, Bld: 131 mg/dL — ABNORMAL HIGH (ref 70–99)
Potassium: 4.5 mmol/L (ref 3.5–5.1)
Sodium: 140 mmol/L (ref 135–145)
Total Bilirubin: 0.7 mg/dL (ref 0.3–1.2)
Total Protein: 7.2 g/dL (ref 6.5–8.1)

## 2022-01-03 LAB — CBC WITH DIFFERENTIAL/PLATELET
Abs Immature Granulocytes: 0.02 10*3/uL (ref 0.00–0.07)
Basophils Absolute: 0 10*3/uL (ref 0.0–0.1)
Basophils Relative: 0 %
Eosinophils Absolute: 0 10*3/uL (ref 0.0–0.5)
Eosinophils Relative: 0 %
HCT: 52.4 % — ABNORMAL HIGH (ref 39.0–52.0)
Hemoglobin: 17.8 g/dL — ABNORMAL HIGH (ref 13.0–17.0)
Immature Granulocytes: 0 %
Lymphocytes Relative: 19 %
Lymphs Abs: 2 10*3/uL (ref 0.7–4.0)
MCH: 32.1 pg (ref 26.0–34.0)
MCHC: 34 g/dL (ref 30.0–36.0)
MCV: 94.4 fL (ref 80.0–100.0)
Monocytes Absolute: 0.7 10*3/uL (ref 0.1–1.0)
Monocytes Relative: 7 %
Neutro Abs: 7.4 10*3/uL (ref 1.7–7.7)
Neutrophils Relative %: 74 %
Platelets: 264 10*3/uL (ref 150–400)
RBC: 5.55 MIL/uL (ref 4.22–5.81)
RDW: 12.6 % (ref 11.5–15.5)
WBC: 10 10*3/uL (ref 4.0–10.5)
nRBC: 0 % (ref 0.0–0.2)

## 2022-01-03 LAB — GLUCOSE, CAPILLARY
Glucose-Capillary: 140 mg/dL — ABNORMAL HIGH (ref 70–99)
Glucose-Capillary: 156 mg/dL — ABNORMAL HIGH (ref 70–99)
Glucose-Capillary: 156 mg/dL — ABNORMAL HIGH (ref 70–99)
Glucose-Capillary: 232 mg/dL — ABNORMAL HIGH (ref 70–99)
Glucose-Capillary: 254 mg/dL — ABNORMAL HIGH (ref 70–99)
Glucose-Capillary: 322 mg/dL — ABNORMAL HIGH (ref 70–99)

## 2022-01-03 LAB — MAGNESIUM: Magnesium: 2.6 mg/dL — ABNORMAL HIGH (ref 1.7–2.4)

## 2022-01-03 LAB — PHOSPHORUS: Phosphorus: 5 mg/dL — ABNORMAL HIGH (ref 2.5–4.6)

## 2022-01-03 LAB — ALPHA-1-ANTITRYPSIN: A-1 Antitrypsin, Ser: 128 mg/dL (ref 101–187)

## 2022-01-03 MED ORDER — PREDNISONE 20 MG PO TABS
50.0000 mg | ORAL_TABLET | Freq: Every day | ORAL | Status: DC
  Administered 2022-01-04: 50 mg via ORAL
  Filled 2022-01-03: qty 1

## 2022-01-03 MED ORDER — PHENOL 1.4 % MT LIQD
1.0000 | OROMUCOSAL | Status: DC | PRN
  Administered 2022-01-03: 1 via OROMUCOSAL
  Filled 2022-01-03: qty 177

## 2022-01-03 NOTE — Plan of Care (Signed)

## 2022-01-03 NOTE — Progress Notes (Signed)
Patient states that he uses his IS and flutter every 20 minutes per his own schedule to prevent pneumonia.  ?

## 2022-01-03 NOTE — Progress Notes (Signed)
?PROGRESS NOTE ? ? ? ?Russell Burns  CLE:[id number] DOB: [birth year] DOA: 01/01/2022 ?PCP: [name], MD  ? ?Brief Narrative:  ?56 y.o. WM PMHx Anxiety, dilated aortic root, HTN, CAD, DM type II, HLD,   ? ?Presents with concerns of increasing shortness of breath. ?  ?Patient reports that back in January he was diagnosed with bronchitis but continued have persistent symptoms of nonproductive cough and shortness of breath on exertion and when attempting to lay flat.  Has underwent treatment with oral steroid and about 3 course of antibiotics with last treatment in mid March.  He denies any symptoms of chest pain or lower extremity edema.  Has used albuterol in the past few months with relief however today became increasingly short of breath while ambulating at work and used his albuterol multiple times without relief and found his O2 to be down t low 80s and decided to present to the ED. ?  ?In the ED, he was noted to be hypoxic down to 88 to 89% and was placed on 3 L.  He was afebrile with mildly elevated blood pressure systolic 944H.  No leukocytosis.  Hemoglobin of 17.3.  BMP unremarkable.  BNP of 19. ?Negative flu and COVID PCR ?  ?CTA chest was negative for pulmonary embolism with stable 4.6 cm ascending aortic aneurysm without dissection or acute aortic finding.  There is bronchial thickening with area of mucous plugging in the lower lobes suggesting small airway disease. ?  ?He has remote history of tobacco use for about 2 weeks but quit 20 years ago.  However maternal uncle has history of COPD due to alpha antitrypsin deficiency. ?  ?He received DuoNeb and IV Solu-Medrol on route with EMS and reports improvement in his symptoms. ? ? ?Subjective: ?4/12 afebrile overnight standing at sink shaving.  States feels significantly improved.  Ambulated around ward without SOB. ? ? ?Assessment & Plan: ? Covid vaccination; ? ?Principal Problem: ?  Acute respiratory failure with hypoxia ([place]) ?Active Problems: ?   Diabetes mellitus without complication ([place]) ?  Essential hypertension ?  Thoracic aortic aneurysm ([place]) ?  HLD (hyperlipidemia) ?  Left renal mass ?  Chronic diastolic CHF (congestive heart failure) ([place]) ? ?Acute respiratory failure with hypoxia ([place]) ?-Presented with hypoxia down to 88 to 89% requiring 3 L via nasal cannula.  He reports about 4 months of symptoms of nonproductive cough with exertional dyspnea and orthopnea.  CTA of the chest is negative for pulmonary embolism but showed findings suggestive of small airway disease with mucous plugging of the lower lobes.  CTA chest in January  showed airway debris bilaterally concerning for bronchitis versus aspiration.  He has no formal diagnosis of COPD and only has remote history of tobacco use 20 years ago,but reports ?-maternal uncle with COPD due to alpha antitrypsin deficiency. ?-4/11 Alpha-1 antitrypsin pending ?-Unclear if this is really COPD. he will need outpatient PFT   ?-DuoNeb QID ?-Trial of nebulized hypertonic saline and inhaled steroid for mucolytic.  ?-4/11 chest physiotherapy TID.  ?-daily IV Solu-Medrol '60mg'$  ?-4/12 resolved: DC Solu-Medrol ?- 4/12 Prednisone 50 mg x 5 days ?   ?Left Renal mass/suspicious cyst of the upper pole RIGHT kidney ?-4/11 review of CT scanning of abdomen 4/4 shows mass in LEFT kidney consistent with malignancy, and suspicious mass in right kidney. ?- 4/12 discussed case with Dr Diona Fanti Urology stated equipment required to remove kidney not in hospital.  Patient to follow-up upon discharge, to discuss  LEFT kidney resection ?  ?  HLD (hyperlipidemia) ?Continue home meds once med rec is complete ?  ?Thoracic aortic aneurysm ([place]) ?-Stable 4.6 cm ascending aortic aneurysm.  See CTA PE protocol below ?-4/11 patient never seen cardiothoracic surgery, and has SIGNIFICANT family history of ruptured AAA in his family. ?- 4/12 discussed case with Dr. Roxan Hockey Cardiothoracic Surgery. patient is to follow-up with him in 6  months.   ? ?Chronic diastolic CHF/Moderate AS ?-Echo in 09/2021 with EF of 55 to 60% and grade 1 diastolic dysfunction. ?- If symptoms do not improve with pulmonary therapy, could consider consult to cardiology to discuss whether symptoms are related to his moderate aortic stenosis. ?-Trending troponins ? ?Essential hypertension ?-BP goal <120/80 if patient can tolerate, secondary to AAA. ?- Amlodipine 10 mg daily ?- Losartan 100 mg daily ?-Hydralazine PRN SBP> 130 or DBP> 90 ?-4/12 BP fairly well controlled on current medication ?  ?Diabetes mellitus without complication ([place]) ?-9/38 increase moderate SSI  ? ?Obese (BMI 34.4 kg/m?) ? ? ? ?Mobility Assessment (last 72 hours)   ? ? Mobility Assessment   ? ? [place] Name 01/03/22 0739 01/03/22 0400 01/03/22 0000 01/02/22 2000  ?  ? Does patient have an order for bedrest or is patient medically unstable No - Continue assessment No - Continue assessment No - Continue assessment No - Continue assessment   ? What is the highest level of mobility based on the progressive mobility assessment? Level 6 (Walks independently in room and hall) - Balance while walking in room without assist - Complete Level 6 (Walks independently in room and hall) - Balance while walking in room without assist - Complete Level 6 (Walks independently in room and hall) - Balance while walking in room without assist - Complete Level 6 (Walks independently in room and hall) - Balance while walking in room without assist - Complete   ? ?  ?  ? ?  ? ? ?DVT prophylaxis: Lovenox ?Code Status:  ?Family Communication:  ?Status is: Inpatient ? ? ? ?Dispo: The patient is from:  ?             Anticipated d/c is to:  ?             Anticipated d/c date is:  ?             Patient currently  ? ? ? ? ? ?Consultants:  ?Phone consult Dr Diona Fanti Urology ?Phone consult Dr. Roxan Hockey Cardiothoracic Surgery ? ? ?Procedures/Significant Events:  ?4/4 CT abdomen W/W0 contrast,Enhancing lobular mass partially exophytic from  the lower pole of ?the LEFT kidney. Mass extends into the LEFT renal hilum and appears ?to involve the LEFT renal collecting system. 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 Recommend ?urology consultation. ?2. Mass contained in the pararenal fascia. The LEFT renal vein ?appears uninvolved. ?3. No retroperitoneal lymphadenopathy. ?4. Benign Bosniak 1 renal cyst of the upper pole RIGHT kidney ?(lesion of concern on comparison CT.) ?4/10 CTA PE protocol: ?No pulmonary embolus. ?2. Bronchial thickening with areas of mucous plugging in the lower ?lobes. Heterogeneous pulmonary parenchyma, suggesting small airways ?disease. ?3. Stable 4.6 cm ascending aortic aneurysm. No dissection or acute ?aortic findings. Ascending thoracic aortic aneurysm. Recommend ?semi-annual imaging followup by CTA or MRA and referral to ?cardiothoracic surgery if not already obtained ? ?I have personally reviewed and interpreted all radiology studies and my findings are as above. ? ?VENTILATOR SETTINGS: ?Room air 4/12 ? ? ?Cultures ?4/10 influenza A/B negative ?4/10 SARS coronavirus negative ?4/11 respiratory  virus panel pending ?4/11 strep pneumo urine antigen pending ?4/11 Legionella urine antigen pending ? ? ? ?Antimicrobials: ?Anti-infectives (From admission, onward)  ? ? None  ? ?  ?  ? ? ?Devices ?  ? ?LINES / TUBES:  ? ? ? ? ?Continuous Infusions: ? ? ?Objective: ?Vitals:  ? 01/03/22 0236 01/03/22 0359 01/03/22 0400 01/03/22 0746  ?BP:  (!) 149/82  (!) 150/92  ?Pulse: 70 77  75  ?Resp: '16 16 16 14  '$ ?Temp:  98 ?F (36.7 ?C) 98 ?F (36.7 ?C) 97.8 ?F (36.6 ?C)  ?TempSrc:  Oral Oral Oral  ?SpO2: 95% 94%  96%  ?Weight:      ?Height:      ? ? ?Intake/Output Summary (Last 24 hours) at 01/03/2022 0841 ?Last data filed at 01/03/2022 0600 ?Gross per 24 hour  ?Intake 200 ml  ?Output 1200 ml  ?Net -1000 ml  ? ? ?Filed Weights  ? 01/02/22 0330 01/02/22 1847  ?Weight: 108.9 kg 105.9 kg  ? ? ?Examination: ? ?General:  A/O x4, positive acute respiratory distress ?Eyes: negative scleral hemorrhage, negative anisocoria, negative icterus ?ENT: Negative Runny nose, negative gingival bleeding, ?Neck:  Negative scars, masses, tortic

## 2022-01-03 NOTE — Progress Notes (Signed)
?  Transition of Care (TOC) Screening Note ? ? ?Patient Details  ?Name: Russell Burns ?Date of Birth: Jan 12, 1966 ? ? ?Transition of Care (TOC) CM/SW Contact:    ?Pollie Friar, RN ?Phone Number: ?01/03/2022, 10:20 AM ? ? ? ?Transition of Care Department [facility]) has reviewed patient and no TOC needs have been identified at this time. We will continue to monitor patient advancement through interdisciplinary progression rounds. If new patient transition needs arise, please place a TOC consult. ?  ?

## 2022-01-03 NOTE — Progress Notes (Signed)
Inpatient Diabetes Program Recommendations ? ?AACE/ADA: New Consensus Statement on Inpatient Glycemic Control (2015) ? ?Target Ranges:  Prepandial:   less than 140 mg/dL ?     Peak postprandial:   less than 180 mg/dL (1-2 hours) ?     Critically ill patients:  140 - 180 mg/dL  ? ?Lab Results  ?Component Value Date  ? GLUCAP 254 (H) 01/03/2022  ? HGBA1C 7.0 (H) 01/02/2022  ? ? ?Review of Glycemic Control ? Latest Reference Range & Units 01/02/22 17:16 01/02/22 21:52 01/03/22 00:12 01/03/22 07:37 01/03/22 11:30  ?Glucose-Capillary 70 - 99 mg/dL 259 (H) 191 (H) 156 (H) 140 (H) 254 (H)  ? ?Diabetes history: DM 2 ?Outpatient Diabetes medications:  ?Jardiance 25 mg daily ?Metformin 1000 mg bid ?Mounjaro 10 mg weekly ?Current orders for Inpatient glycemic control:  ?Novolog moderate q 4 hours ?Solumedrol 60 mg IV daily ? ?Inpatient Diabetes Program Recommendations:   ? ?Consider adding Semglee 10 units daily while in the hospital and on steroids.  ? ?Thanks,  ?Adah Perl, RN, BC-ADM ?Inpatient Diabetes Coordinator ?Pager [phone]  (8a-5p) ? ? ?

## 2022-01-03 NOTE — Progress Notes (Signed)
Nutrition Brief Note ? ?Patient identified on the Malnutrition Screening Tool (MST) Report ? ?Pt reports recent weight loss but that this weight was lost intentionally. Has not had poor intake.  ? ?Wt Readings from Last 15 Encounters:  ?01/02/22 105.9 kg  ?10/27/20 122.9 kg  ?09/30/20 124.3 kg  ?08/31/20 124.3 kg  ?11/22/16 125.6 kg  ?10/05/16 121.6 kg  ? ? ?Body mass index is 33.5 kg/m?Marland Kitchen Patient meets criteria for obesity based on current BMI.  ? ?Current diet order is heart healthy, adjusted to carb modified for high glucose levels. Labs and medications reviewed.  ? ?No nutrition interventions warranted at this time. If nutrition issues arise, please consult RD.  ? ?Ranell Patrick, RD, LDN ?Clinical Dietitian ?RD pager # available in [place]  ?After hours/weekend pager # available in [place] ?

## 2022-01-03 NOTE — Progress Notes (Signed)
Mobility Specialist Progress Note  ? ? 01/03/22 1613  ?Mobility  ?Activity Ambulated independently in hallway  ?Level of Assistance Independent  ?Assistive Device None  ?Distance Ambulated (ft) 800 ft  ?Activity Response Tolerated well  ?$Mobility charge 1 Mobility  ? ?Pre-Mobility: 91 HR ?During Mobility: 91% on RA SpO2 ?Post-Mobility: 105 HR, 94% on RA SpO2 ? ?Pt received in bed and agreeable. Ambulated on RA maintaining SpO2 >/=91%. No complaints. RN agreeable to leave pt on RA. Left with call bell in reach.  ? ?Russell Burns ?Mobility Specialist  ?  ?

## 2022-01-03 NOTE — Progress Notes (Signed)
Patient has IS and flutter at bedside and is currently using as RT enters the room. Patient states he is using these frequently on his own.  ?

## 2022-01-04 ENCOUNTER — Other Ambulatory Visit (HOSPITAL_COMMUNITY): Payer: Self-pay

## 2022-01-04 LAB — CBC WITH DIFFERENTIAL/PLATELET
Abs Immature Granulocytes: 0.03 10*3/uL (ref 0.00–0.07)
Basophils Absolute: 0 10*3/uL (ref 0.0–0.1)
Basophils Relative: 0 %
Eosinophils Absolute: 0 10*3/uL (ref 0.0–0.5)
Eosinophils Relative: 0 %
HCT: 47.6 % (ref 39.0–52.0)
Hemoglobin: 16.2 g/dL (ref 13.0–17.0)
Immature Granulocytes: 0 %
Lymphocytes Relative: 19 %
Lymphs Abs: 1.8 10*3/uL (ref 0.7–4.0)
MCH: 31.8 pg (ref 26.0–34.0)
MCHC: 34 g/dL (ref 30.0–36.0)
MCV: 93.5 fL (ref 80.0–100.0)
Monocytes Absolute: 0.8 10*3/uL (ref 0.1–1.0)
Monocytes Relative: 9 %
Neutro Abs: 6.7 10*3/uL (ref 1.7–7.7)
Neutrophils Relative %: 72 %
Platelets: 264 10*3/uL (ref 150–400)
RBC: 5.09 MIL/uL (ref 4.22–5.81)
RDW: 12.5 % (ref 11.5–15.5)
WBC: 9.3 10*3/uL (ref 4.0–10.5)
nRBC: 0 % (ref 0.0–0.2)

## 2022-01-04 LAB — LEGIONELLA PNEUMOPHILA SEROGP 1 UR AG: L. pneumophila Serogp 1 Ur Ag: NEGATIVE

## 2022-01-04 LAB — GLUCOSE, CAPILLARY
Glucose-Capillary: 161 mg/dL — ABNORMAL HIGH (ref 70–99)
Glucose-Capillary: 198 mg/dL — ABNORMAL HIGH (ref 70–99)

## 2022-01-04 LAB — COMPREHENSIVE METABOLIC PANEL
ALT: 18 U/L (ref 0–44)
AST: 15 U/L (ref 15–41)
Albumin: 3.8 g/dL (ref 3.5–5.0)
Alkaline Phosphatase: 45 U/L (ref 38–126)
Anion gap: 5 (ref 5–15)
BUN: 18 mg/dL (ref 6–20)
CO2: 27 mmol/L (ref 22–32)
Calcium: 9.1 mg/dL (ref 8.9–10.3)
Chloride: 105 mmol/L (ref 98–111)
Creatinine, Ser: 0.76 mg/dL (ref 0.61–1.24)
GFR, Estimated: >60 mL/min (ref >60)
Glucose, Bld: 139 mg/dL — ABNORMAL HIGH (ref 70–99)
Potassium: 4 mmol/L (ref 3.5–5.1)
Sodium: 137 mmol/L (ref 135–145)
Total Bilirubin: 0.4 mg/dL (ref 0.3–1.2)
Total Protein: 6.6 g/dL (ref 6.5–8.1)

## 2022-01-04 LAB — PHOSPHORUS: Phosphorus: 4.4 mg/dL (ref 2.5–4.6)

## 2022-01-04 LAB — MAGNESIUM: Magnesium: 2.5 mg/dL — ABNORMAL HIGH (ref 1.7–2.4)

## 2022-01-04 MED ORDER — PREDNISONE 50 MG PO TABS
50.0000 mg | ORAL_TABLET | Freq: Every day | ORAL | 0 refills | Status: DC
  Filled 2022-01-04: qty 6, 6d supply, fill #0

## 2022-01-04 MED ORDER — IPRATROPIUM-ALBUTEROL 0.5-2.5 (3) MG/3ML IN SOLN
3.0000 mL | Freq: Three times a day (TID) | RESPIRATORY_TRACT | Status: DC
  Administered 2022-01-04: 3 mL via RESPIRATORY_TRACT
  Filled 2022-01-04: qty 3

## 2022-01-04 NOTE — Discharge Summary (Signed)
Physician Discharge Summary  ?Russell Burns OMV:[id number] DOB: Jul 08, 1966 DOA: 01/01/2022 ? ?PCP: [name], MD ? ?Admit date: 01/01/2022 ?Discharge date: 01/07/2022 ? ?Time spent: 50 minutes ? ?Recommendations for Outpatient Follow-up:  ? ? ?Acute respiratory failure with hypoxia ([place]) ?-Presented with hypoxia down to 88 to 89% requiring 3 L via nasal cannula.  He reports about 4 months of symptoms of nonproductive cough with exertional dyspnea and orthopnea.  CTA of the chest is negative for pulmonary embolism but showed findings suggestive of small airway disease with mucous plugging of the lower lobes.  CTA chest in January  showed airway debris bilaterally concerning for bronchitis versus aspiration.  He has no formal diagnosis of COPD and only has remote history of tobacco use 20 years ago,but reports ?-maternal uncle with COPD due to alpha antitrypsin deficiency. ?-4/11 Alpha-1 antitrypsin pending ?-Unclear if this is really COPD. he will need outpatient PFT   ?-DuoNeb QID ?-Trial of nebulized hypertonic saline and inhaled steroid for mucolytic.  ?-4/11 chest physiotherapy TID.  ?-daily IV Solu-Medrol '60mg'$  ?-4/12 resolved: DC Solu-Medrol ?- 4/12 Prednisone 50 mg x 5 days ?   ?Left Renal mass/suspicious cyst of the upper pole RIGHT kidney ?-4/11 review of CT scanning of abdomen 4/4 shows mass in LEFT kidney consistent with malignancy, and suspicious mass in right kidney. ?- 4/12 discussed case with Dr Diona Fanti Urology stated equipment required to remove kidney not in hospital.  Patient to follow-up upon discharge, to discuss  LEFT kidney resection ?-Follow-up with urologist ASAP upon discharge ?  ?HLD (hyperlipidemia) ?Continue home meds once med rec is complete ?  ?Thoracic aortic aneurysm ([place]) ?-Stable 4.6 cm ascending aortic aneurysm.  See CTA PE protocol below ?-4/11 patient never seen cardiothoracic surgery, and has SIGNIFICANT family history of ruptured AAA in his family. ?- 4/12 discussed case with  Dr. Roxan Hockey Cardiothoracic Surgery. patient is to follow-up with him in 6 months.   ?  ?Chronic diastolic CHF/Moderate AS ?-Echo in 09/2021 with EF of 55 to 60% and grade 1 diastolic dysfunction. ?- If symptoms do not improve with pulmonary therapy, could consider consult to cardiology to discuss whether symptoms are related to his moderate aortic stenosis. ? ?  ?Essential hypertension ?-BP goal <120/80 if patient can tolerate, secondary to AAA. ?- Amlodipine 10 mg daily ?- Losartan 100 mg daily ?-Hydralazine PRN SBP> 130 or DBP> 90 ?-4/12 BP fairly well controlled on current medication ?  ?Diabetes mellitus without complication ([place]) ?- Discharged on home medication ? ?Obese (BMI 34.4 kg/m?) ?-Discuss weight loss strategies with PCP ? ? ?Discharge Diagnoses:  ?Principal Problem: ?  Acute respiratory failure with hypoxia ([place]) ?Active Problems: ?  Diabetes mellitus without complication ([place]) ?  Essential hypertension ?  Thoracic aortic aneurysm ([place]) ?  HLD (hyperlipidemia) ?  Left renal mass ?  Chronic diastolic CHF (congestive heart failure) ([place]) ? ? ?Discharge Condition: Stable ? ?Diet recommendation: Carb modified ? ?Filed Weights  ? 01/02/22 0330 01/02/22 1847  ?Weight: 108.9 kg 105.9 kg  ? ? ?History of present illness:  ?56 y.o. WM PMHx Anxiety, dilated aortic root, HTN, CAD, DM type II, HLD,   ?  ?Presents with concerns of increasing shortness of breath. ?  ?Patient reports that back in January he was diagnosed with bronchitis but continued have persistent symptoms of nonproductive cough and shortness of breath on exertion and when attempting to lay flat.  Has underwent treatment with oral steroid and about 3 course of antibiotics with last treatment in  mid March.  He denies any symptoms of chest pain or lower extremity edema.  Has used albuterol in the past few months with relief however today became increasingly short of breath while ambulating at work and used his albuterol multiple times without  relief and found his O2 to be down t low 80s and decided to present to the ED. ?  ?In the ED, he was noted to be hypoxic down to 88 to 89% and was placed on 3 L.  He was afebrile with mildly elevated blood pressure systolic 242A.  No leukocytosis.  Hemoglobin of 17.3.  BMP unremarkable.  BNP of 19. ?Negative flu and COVID PCR ?  ?CTA chest was negative for pulmonary embolism with stable 4.6 cm ascending aortic aneurysm without dissection or acute aortic finding.  There is bronchial thickening with area of mucous plugging in the lower lobes suggesting small airway disease. ?  ?He has remote history of tobacco use for about 2 weeks but quit 20 years ago.  However maternal uncle has history of COPD due to alpha antitrypsin deficiency. ?  ?He received DuoNeb and IV Solu-Medrol on route with EMS and reports improvement in his symptoms. ?  ? ?Hospital Course:  ?See above ? ?Procedures: ?4/4 CT abdomen W/W0 contrast,Enhancing lobular mass partially exophytic from the lower pole of ?the LEFT kidney. Mass extends into the LEFT renal hilum and appears ?to involve the LEFT renal collecting system. 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 Recommend ?urology consultation. ?2. Mass contained in the pararenal fascia. The LEFT renal vein ?appears uninvolved. ?3. No retroperitoneal lymphadenopathy. ?4. Benign Bosniak 1 renal cyst of the upper pole RIGHT kidney ?(lesion of concern on comparison CT.) ?4/10 CTA PE protocol: ?No pulmonary embolus. ?2. Bronchial thickening with areas of mucous plugging in the lower ?lobes. Heterogeneous pulmonary parenchyma, suggesting small airways ?disease. ?3. Stable 4.6 cm ascending aortic aneurysm. No dissection or acute ?aortic findings. Ascending thoracic aortic aneurysm. Recommend ?semi-annual imaging followup by CTA or MRA and referral to ?cardiothoracic surgery if not already obtained ? ?Consultations: ?Phone consult Dr Diona Fanti U[facility] ?Phone consult Dr.  Roxan Hockey Cardiothoracic Surgery ? ?Cultures  ?4/10 influenza A/B negative ?4/10 SARS coronavirus negative ?4/11 respiratory virus panel pending ?4/11 strep pneumo urine antigen pending ?4/11 Legionella urine antigen pending ? ? ?Discharge Exam: ?Vitals:  ? 01/04/22 0719 01/04/22 1233 01/04/22 1235 01/04/22 1308  ?BP:    (!) 151/92  ?Pulse:  78 85 82  ?Resp: 18   18  ?Temp:      ?TempSrc:      ?SpO2:  (!) 88% 92% 94%  ?Weight:      ?Height:      ? ? ?General: A/O x4, positive acute respiratory distress ?Eyes: negative scleral hemorrhage, negative anisocoria, negative icterus ?ENT: Negative Runny nose, negative gingival bleeding, ?Neck:  Negative scars, masses, torticollis, lymphadenopathy, JVD ?Lungs: clear to auscultation bilaterally without wheezes or crackles ?Cardiovascular: Regular rate and rhythm without murmur gallop or rub normal S1 and S2 ? ?Discharge Instructions ? ? ?Allergies as of 01/04/2022   ? ?   Reactions  ? Bee Venom Anaphylaxis  ? ?  ? ?  ?Medication List  ?  ? ?STOP taking these medications   ? ?acetaminophen 500 MG tablet ?Commonly known as: TYLENOL ?  ? ?  ? ?TAKE these medications   ? ?albuterol 108 (90 Base) MCG/ACT inhaler ?Commonly known as: VENTOLIN HFA ?Inhale 1 puff into the lungs every 4 (four) hours as needed  for wheezing or shortness of breath. ?  ?amLODipine 10 MG tablet ?Commonly known as: NORVASC ?Take 10 mg by mouth daily. ?  ?aspirin 81 MG chewable tablet ?Chew 81 mg by mouth daily. ?  ?atorvastatin 80 MG tablet ?Commonly known as: LIPITOR ?Take 80 mg by mouth daily. ?  ?B-complex with vitamin C tablet ?Take 1 tablet by mouth daily. ?  ?cetirizine 10 MG tablet ?Commonly known as: ZYRTEC ?Take 10 mg by mouth 2 (two) times daily. ?  ?EPINEPHrine 0.3 mg/0.3 mL Soaj injection ?Commonly known as: EPI-PEN ?Inject 0.3 mg into the muscle as needed for anaphylaxis. ?  ?ezetimibe 10 MG tablet ?Commonly known as: ZETIA ?Take 10 mg by mouth daily. ?  ?Jardiance 25 MG Tabs tablet ?Generic  drug: empagliflozin ?Take 25 mg by mouth daily. ?  ?losartan 100 MG tablet ?Commonly known as: COZAAR ?Take 100 mg by mouth daily. ?  ?metFORMIN 1000 MG tablet ?Commonly known as: GLUCOPHAGE ?Take 1,000 mg

## 2022-01-04 NOTE — Progress Notes (Signed)
Inpatient Diabetes Program Recommendations ? ?AACE/ADA: New Consensus Statement on Inpatient Glycemic Control (2015) ? ?Target Ranges:  Prepandial:   less than 140 mg/dL ?     Peak postprandial:   less than 180 mg/dL (1-2 hours) ?     Critically ill patients:  140 - 180 mg/dL  ? ?Lab Results  ?Component Value Date  ? GLUCAP 161 (H) 01/04/2022  ? HGBA1C 7.0 (H) 01/02/2022  ? ? ?Review of Glycemic Control ? Latest Reference Range & Units 01/03/22 11:30 01/03/22 15:25 01/03/22 20:04 01/03/22 23:34 01/04/22 07:30  ?Glucose-Capillary 70 - 99 mg/dL 254 (H) 322 (H) 232 (H) 156 (H) 161 (H)  ?Diabetes history: DM 2 ?Outpatient Diabetes medications:  ?Jardiance 25 mg daily ?Metformin 1000 mg bid ?Mounjaro 10 mg weekly ?Current orders for Inpatient glycemic control:  ?Novolog moderate q 4 hours ?Prednisone 50 mg daily ? ?Inpatient Diabetes Program Recommendations:   ?Consider adding Semglee 8 units daily and add Novolog meal coverage 3 units tid with meals (hold if patient eats less than 50% or NPO). Also please reduce Novolog correction to tid with meals.  ? ?Thanks,  ?Adah Perl, RN, BC-ADM ?Inpatient Diabetes Coordinator ?Pager [phone]  (8a-5p) ?

## 2022-01-04 NOTE — TOC Transition Note (Signed)
Transition of Care (TOC) - CM/SW Discharge Note ? ? ?Patient Details  ?Name: Russell Burns ?MRN: [id number] ?Date of Birth: 1966/04/29 ? ?Transition of Care (TOC) CM/SW Contact:  ?Pollie Friar, RN ?Phone Number: ?01/04/2022, 12:19 PM ? ? ?Clinical Narrative:    ?Patient discharging home with self care. No needs per TOC.  ? ? ?Final next level of care: Home/Self Care ?Barriers to Discharge: No Barriers Identified ? ? ?Patient Goals and CMS Choice ?  ?  ?  ? ?Discharge Placement ?  ?           ?  ?  ?  ?  ? ?Discharge Plan and Services ?  ?  ?           ?  ?  ?  ?  ?  ?  ?  ?  ?  ?  ? ?Social Determinants of Health (SDOH) Interventions ?  ? ? ?Readmission Risk Interventions ?   ? View : No data to display.  ?  ?  ?  ? ? ? ? ? ?

## 2022-01-11 DIAGNOSIS — D49512 Neoplasm of unspecified behavior of left kidney: Secondary | ICD-10-CM | POA: Diagnosis not present

## 2022-01-12 DIAGNOSIS — R0902 Hypoxemia: Secondary | ICD-10-CM | POA: Diagnosis not present

## 2022-01-15 ENCOUNTER — Telehealth: Payer: Self-pay | Admitting: Cardiology

## 2022-01-15 ENCOUNTER — Other Ambulatory Visit: Payer: Self-pay | Admitting: Urology

## 2022-01-15 DIAGNOSIS — D49512 Neoplasm of unspecified behavior of left kidney: Secondary | ICD-10-CM

## 2022-01-15 NOTE — Telephone Encounter (Signed)
? ?  Pre-operative Risk Assessment  ?  ?Patient Name: Russell Burns  ?DOB: 02/07/1966 ?MRN: [id number]  ? ?  ? ?Request for Surgical Clearance   ? ?Procedure:   Left Laparoscopic Nephrectomy ? ?Date of Surgery:  Clearance 02/21/22                              ?   ?Surgeon:  Dr. Link Snuffer ?Surgeon's Group or Practice Name:  [facility] ?Phone number:  [phone] Ext. [phone] ?Fax number:  [phone] ?  ?Type of Clearance Requested:   ?- Pharmacy:  Hold Aspirin   ?  ?Type of Anesthesia:  General  ?  ?Additional requests/questions:  Please advise surgeon/provider what medications should be held. ? ?Signed, ?Belisicia T Harris   ?01/15/2022, 9:58 AM  ? ?

## 2022-01-16 NOTE — Telephone Encounter (Signed)
? ?  Name: Russell Burns  ?DOB: November 23, 1965  ?MRN: [id number] ? ?Primary Cardiologist: Candee Furbish, MD ? ?Chart reviewed as part of pre-operative protocol coverage. Because of Russell Burns's past medical history and time since last visit, he will require a follow-up in-office visit in order to better assess preoperative cardiovascular risk. ? ?Pre-op covering staff: ?- Please schedule appointment and call patient to inform them. If patient already had an upcoming appointment within acceptable timeframe, please add "pre-op clearance" to the appointment notes so provider is aware. ?- Please contact requesting surgeon's office via preferred method (i.e, phone, fax) to inform them of need for appointment prior to surgery. ? ?This message will also be routed to pharmacy pool and/or Dr Marlou Porch for input on holding ASA. as requested below so that this information is available to the clearing provider at time of patient's appointment.  ? ?Leanor Kail, PA  ?01/16/2022, 8:38 AM   ?

## 2022-01-16 NOTE — Telephone Encounter (Signed)
Pt has been scheduled to see Dr. Marlou Porch, 02/07/22, clearance will be addressed at that time. ? ?Will route back to the requesting surgeon's office to make them aware. ?

## 2022-01-29 NOTE — Patient Instructions (Addendum)
DUE TO COVID-19 ONLY TWO VISITORS  (aged 55 and older)  ARE ALLOWED TO COME WITH YOU AND STAY IN THE WAITING ROOM ONLY DURING PRE OP AND PROCEDURE.  ?  ?**NO VISITORS ARE ALLOWED IN THE SHORT STAY AREA OR RECOVERY ROOM!!** ? ? ?IF YOU WILL BE ADMITTED INTO THE HOSPITAL YOU ARE ALLOWED ONLY FOUR SUPPORT PEOPLE DURING VISITATION HOURS ONLY (7 AM -8PM)   ?The support person(s) must pass our screening, gel in and out, and wear a mask at all times, including in the patient?s room. ?Patients must also wear a mask when staff or their support person are in the room. ?Visitors GUEST BADGE MUST BE WORN VISIBLY  ?One adult visitor may remain with you overnight and MUST be in the room by 8 P.M. ?  ? ? Your procedure is scheduled on: 02/21/22 ? ? Report to [facility] Main Entrance ? ?  Report to short stay at 5:15 AM ? ? Call this number if you have problems the morning of surgery [phone] ? ? Do not eat food :After Midnight.02/20/22 ? ?  may have the following liquids until _Midnight  on DAY OF SURGERY ? ?Water ?Black Coffee (sugar ok, NO MILK/CREAM OR CREAMERS)  ?Tea (sugar ok, NO MILK/CREAM OR CREAMERS) regular and decaf                             ?Plain Jell-O (NO RED)                                           ?Fruit ices (not with fruit pulp, NO RED)                                     ?Popsicles (NO RED)                                                                  ?Juice: apple, WHITE grape, WHITE cranberry ?Sports drinks like Gatorade (NO RED) ?Clear broth(vegetable,chicken,beef) ? ?             ?  If you have questions, please contact your surgeon?s office. ? ? ?Oral Hygiene is also important to reduce your risk of infection.                                    ?Remember - BRUSH YOUR TEETH THE MORNING OF SURGERY WITH YOUR REGULAR TOOTHPASTE ? ? Do NOT smoke after Midnight ? ? ? Before surgery.Stop taking _ASA 81 mg__________on _5/6_________as instructed by ___Dr. Bell__________. ? ?  ? ? Take these  medicines the morning of surgery with A SIP OF WATER: Amlodipine. Use your inhalers and bring them with you. ? ?            Do not take your Jardiance the day before surgery ? ?DO NOT TAKE ANY ORAL DIABETIC MEDICATIONS DAY OF YOUR SURGERY ? ?                  ?  You may not have any metal on your body including jewelry, and body piercing ? ?           Do not wear  lotions, powders, perfumes/cologne, or deodorant ? ? ?            Men may shave face and neck. ? ? Do not bring valuables to the hospital. [place] NOT ?            RESPONSIBLE   FOR VALUABLES. ? ? Contacts, dentures or bridgework may not be worn into surgery. ? ? Bring small overnight bag day of surgery. ?  ? ? Special Instructions: Bring a copy of your healthcare power of attorney and living will documents the day of surgery if you haven't scanned them before. ? ?            Please read over the following fact sheets you were given: IF [place] [phone] ? ?   [place] - Preparing for Surgery ?Before surgery, you can play an important role.  Because skin is not sterile, your skin needs to be as free of germs as possible.  You can reduce the number of germs on your skin by washing with CHG (chlorahexidine gluconate) soap before surgery.  CHG is an antiseptic cleaner which kills germs and bonds with the skin to continue killing germs even after washing. ?Please DO NOT use if you have an allergy to CHG or antibacterial soaps.  If your skin becomes reddened/irritated stop using the CHG and inform your nurse when you arrive at Short Stay..  You may shave your face/neck. ?Please follow these instructions carefully: ? 1.  Shower with CHG Soap the night before surgery and the  morning of Surgery. ? 2.  If you choose to wash your hair, wash your hair first as usual with your  normal  shampoo. ? 3.  After you shampoo, rinse your hair and body thoroughly to remove the  shampoo.                         ?    4.  Use CHG as you would any other liquid soap.  You can apply chg directly  to the skin and wash  ?                     Gently with a scrungie or clean washcloth. ? 5.  Apply the CHG Soap to your body ONLY FROM THE NECK DOWN.   Do not use on face/ open      ?                     Wound or open sores. Avoid contact with eyes, ears mouth and genitals (private parts).  ?                     Production manager,  Genitals (private parts) with your normal soap. ?            6.  Wash thoroughly, paying special attention to the area where your surgery  will be performed. ? 7.  Thoroughly rinse your body with warm water from the neck down. ? 8.  DO NOT shower/wash with your normal soap after using and rinsing off  the CHG Soap. ?            9.  Pat yourself dry  with a clean towel. ?           10.  Wear clean pajamas. ?           11.  Place clean sheets on your bed the night of your first shower and do not  sleep with pets. ?Day of Surgery : ?Do not apply any lotions/deodorants the morning of surgery.  Please wear clean clothes to the hospital/surgery center. ? ?FAILURE TO FOLLOW THESE INSTRUCTIONS MAY RESULT IN THE CANCELLATION OF YOUR SURGERY ? ? ? ?________________________________________________________________________  ?

## 2022-01-30 ENCOUNTER — Other Ambulatory Visit: Payer: Self-pay

## 2022-01-30 ENCOUNTER — Encounter (HOSPITAL_COMMUNITY)
Admission: RE | Admit: 2022-01-30 | Discharge: 2022-01-30 | Disposition: A | Discharge status: Home / Self Care | Payer: BC Managed Care – PPO | Source: Ambulatory Visit | Attending: Urology | Admitting: Urology

## 2022-01-30 ENCOUNTER — Encounter (HOSPITAL_COMMUNITY): Payer: Self-pay

## 2022-01-30 VITALS — BP 137/84 | HR 78 | Temp 98.2°F | Resp 18 | Ht 70.0 in | Wt 237.1 lb

## 2022-01-30 DIAGNOSIS — Z01818 Encounter for other preprocedural examination: Secondary | ICD-10-CM

## 2022-01-30 DIAGNOSIS — Z01812 Encounter for preprocedural laboratory examination: Secondary | ICD-10-CM | POA: Insufficient documentation

## 2022-01-30 HISTORY — DX: Malignant (primary) neoplasm, unspecified: C80.1

## 2022-01-30 LAB — CBC
HCT: 47.7 % (ref 39.0–52.0)
Hemoglobin: 15.7 g/dL (ref 13.0–17.0)
MCH: 31.1 pg (ref 26.0–34.0)
MCHC: 32.9 g/dL (ref 30.0–36.0)
MCV: 94.5 fL (ref 80.0–100.0)
Platelets: 222 10*3/uL (ref 150–400)
RBC: 5.05 MIL/uL (ref 4.22–5.81)
RDW: 12.1 % (ref 11.5–15.5)
WBC: 5.4 10*3/uL (ref 4.0–10.5)
nRBC: 0 % (ref 0.0–0.2)

## 2022-01-30 LAB — BASIC METABOLIC PANEL
Anion gap: 10 (ref 5–15)
BUN: 15 mg/dL (ref 6–20)
CO2: 28 mmol/L (ref 22–32)
Calcium: 9.4 mg/dL (ref 8.9–10.3)
Chloride: 103 mmol/L (ref 98–111)
Creatinine, Ser: 0.81 mg/dL (ref 0.61–1.24)
GFR, Estimated: >60 mL/min (ref >60)
Glucose, Bld: 143 mg/dL — ABNORMAL HIGH (ref 70–99)
Potassium: 5.1 mmol/L (ref 3.5–5.1)
Sodium: 141 mmol/L (ref 135–145)

## 2022-01-30 LAB — GLUCOSE, CAPILLARY: Glucose-Capillary: 160 mg/dL — ABNORMAL HIGH (ref 70–99)

## 2022-01-30 NOTE — Progress Notes (Addendum)
Anesthesia note: ? ?Bowel prep reminder:NA ? ?PCP - Dr. Aviva Signs ?Cardiologist -Dr. Derl Barrow ?Other-  ? ?Chest x-ray - 01/01/22-epic ?EKG - 01/01/22-epic ?Stress Test -  ?ECHO -  ?Cardiac Cath -  ? ?Pacemaker/ICD device last checked: ? ?Sleep Study - no ?CPAP - no ? ? ?Fasting Blood Sugar - 110-215 ?Checks Blood Sugar BID_____ ? ?Blood Thinner:ASA 81 mg/ Dr. Virgina Jock ?Blood Thinner Instructions: ?Aspirin Instructions:stop 7 days prior to DOS ?Last Dose:pt stopped 01/27/22 ? ?Anesthesia review: yes ? ?Patient denies shortness of breath, fever, cough and chest pain at PAT appointment ?Pt was in the hospital for  respiratory issues. Neg for flu,covid,or pneumonia. His lungs are clear now and O2 sats are normal. He is being watch closely for aortic aneurysm. 36 mm. ?Pt is requesting a nicotine patch for post op. ?Patient verbalized understanding of instructions that were given to them at the PAT appointment. Patient was also instructed that they will need to review over the PAT instructions again at home before surgery. yes ?

## 2022-02-01 ENCOUNTER — Ambulatory Visit
Admission: RE | Admit: 2022-02-01 | Discharge: 2022-02-01 | Disposition: A | Discharge status: Home / Self Care | Payer: BC Managed Care – PPO | Source: Ambulatory Visit | Attending: Urology | Admitting: Urology

## 2022-02-01 DIAGNOSIS — D49512 Neoplasm of unspecified behavior of left kidney: Secondary | ICD-10-CM

## 2022-02-01 DIAGNOSIS — R1900 Intra-abdominal and pelvic swelling, mass and lump, unspecified site: Secondary | ICD-10-CM | POA: Diagnosis not present

## 2022-02-01 DIAGNOSIS — N281 Cyst of kidney, acquired: Secondary | ICD-10-CM | POA: Diagnosis not present

## 2022-02-01 DIAGNOSIS — N2889 Other specified disorders of kidney and ureter: Secondary | ICD-10-CM | POA: Diagnosis not present

## 2022-02-01 DIAGNOSIS — K760 Fatty (change of) liver, not elsewhere classified: Secondary | ICD-10-CM | POA: Diagnosis not present

## 2022-02-01 MED ORDER — GADOBENATE DIMEGLUMINE 529 MG/ML IV SOLN
20.0000 mL | Freq: Once | INTRAVENOUS | Status: AC | PRN
  Administered 2022-02-01: 20 mL via INTRAVENOUS

## 2022-02-07 ENCOUNTER — Encounter: Payer: Self-pay | Admitting: Cardiology

## 2022-02-07 ENCOUNTER — Ambulatory Visit: Payer: BC Managed Care – PPO | Admitting: Cardiology

## 2022-02-07 ENCOUNTER — Other Ambulatory Visit: Payer: Self-pay | Admitting: Urology

## 2022-02-07 VITALS — BP 110/70 | HR 69 | Ht 70.0 in | Wt 242.0 lb

## 2022-02-07 DIAGNOSIS — Z01812 Encounter for preprocedural laboratory examination: Secondary | ICD-10-CM

## 2022-02-07 DIAGNOSIS — I2510 Atherosclerotic heart disease of native coronary artery without angina pectoris: Secondary | ICD-10-CM | POA: Diagnosis not present

## 2022-02-07 DIAGNOSIS — I77810 Thoracic aortic ectasia: Secondary | ICD-10-CM

## 2022-02-07 DIAGNOSIS — Z01810 Encounter for preprocedural cardiovascular examination: Secondary | ICD-10-CM

## 2022-02-07 DIAGNOSIS — I712 Thoracic aortic aneurysm, without rupture, unspecified: Secondary | ICD-10-CM

## 2022-02-07 DIAGNOSIS — I7121 Aneurysm of the ascending aorta, without rupture: Secondary | ICD-10-CM

## 2022-02-07 NOTE — Patient Instructions (Signed)
Medication Instructions:  ?The current medical regimen is effective;  continue present plan and medications. ? ?*If you need a refill on your cardiac medications before your next appointment, please call your pharmacy* ? ?Lab Work: ?Please have blood work prior to CTA of chest in 6 months. (BMP) ? ?If you have labs (blood work) drawn today and your tests are completely normal, you will receive your results only by: ?MyChart Message (if you have MyChart) OR ?A paper copy in the mail ?If you have any lab test that is abnormal or we need to change your treatment, we will call you to review the results. ? ?Testing/Procedures: ?CT Angiography (CTA) of your chest, is a special type of CT scan that uses a computer to produce multi-dimensional views of major blood vessels throughout the body. In CT angiography, a contrast material is injected through an IV to help visualize the blood vessels.  This is due in 5 to 6 months.  You will be contacted to be scheduled for this testing. ? ?Follow-Up: ?At [facility], you and your health needs are our priority.  As part of our continuing mission to provide you with exceptional heart care, we have created designated Provider Care Teams.  These Care Teams include your primary Cardiologist (physician) and Advanced Practice Providers (APPs -  Physician Assistants and Nurse Practitioners) who all work together to provide you with the care you need, when you need it. ? ?We recommend signing up for the patient portal called "MyChart".  Sign up information is provided on this After Visit Summary.  MyChart is used to connect with patients for Virtual Visits (Telemedicine).  Patients are able to view lab/test results, encounter notes, upcoming appointments, etc.  Non-urgent messages can be sent to your provider as well.   ?To learn more about what you can do with MyChart, go to [email or web address].   ? ?Your next appointment:   ?6 month(s) ? ?The format for your next appointment:   ?In  Person ? ?Provider:   ?Candee Furbish, MD { ? ? ?Important Information About Sugar ? ? ? ? ?  ?

## 2022-02-07 NOTE — Progress Notes (Signed)
?Cardiology Office Note:   ? ?Date:  02/07/2022  ? ?ID:  Russell Burns, DOB November 09, 1965, MRN [id number] ? ?PCP:  [name], MD  ?[place] Cardiologist:  Candee Furbish, MD  ?[facility] Electrophysiologist:  None  ? ?Referring MD: [name], MD  ? ? ? ?History of Present Illness:   ? ?Russell Burns is a 56 y.o. male here for follow-up and discussion of Echo. Also presents for surgical clearance for left laparoscopic nephrectomy to be performed by Dr. Gloriann Loan on 02/21/2022. ? ?Pharmacologic stress test in the setting of coronary calcium score of 405, calcified plaque. His stress test overall was low risk without any significant evidence of ischemia.  His echocardiogram also showed normal pump function.  Could not exclude the possibility of bicuspid aortic valve. ? ?He is on aggressive hyperlipidemia management with PCSK9 inhibitor as well as statin and Zetia. ? 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  Has a strong family history of aortic dissection/aneurysms. ? ?His mother father had a AAA, cousin professor at [employer] died while walking descending aortic, mother's brother had descending aneurysm, her brother son had ascending aortic aneurysm dissection, found dead in his chair ? ?"Prior note:DM, strong FHX of CAD father. No CP no SOB. Mother side of family. Her father had AAA. Her brother had AAA as well. Another mother brother. A cousin died of ascending aorta. ?  ?A1c <7. No smoking. Chantix to help with snuff.  ? ?Previously had extensive cardiac evaluation in 2005 at the [facility] clinic in [place]. ?  ?Second 2005 remembers having a stress test done where they had an oxygen mask on him, pushed into his limits until he "collapsed from exhaustion" then he was placed on a table where they tried to get images and then he needed to get back on the treadmill because they were unable to get adequate images (sounds like a stress echo and his heart rate quickly dropped) ?  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 Glucose was 560. ?  ? ?At his last appointment he was doing well with no chest discomfort, no significant shortness of breath.  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  ?  ?Oretha Ellis is his sister, works with Dr. [name].  ? ?Today: ?Unfortunately he was found to have a mass on his left kidney. He presents today for surgical clearance for left laparoscopic nephrectomy to be performed by Dr. Gloriann Loan on 02/21/2022. ? ?On 4/10 he developed a pulmonary infection and was in the hospital 4 days. ? ?Since his last visit he has seen a geneticist. ? ?He denies any palpitations, chest pain, shortness of breath, or peripheral edema. No lightheadedness, headaches, syncope, orthopnea, or PND. ? ? ?Past Medical History:  ?Diagnosis Date  ? Allergy   ? Blood transfusion without reported diagnosis   ? Cancer [facility])   ? Diabetes mellitus without complication ([place])   ? Hyperlipidemia   ? Hypertension   ? Obesity   ? ? ?Past Surgical History:  ?Procedure Laterality Date  ? TONSILLECTOMY    ? WISDOM TOOTH EXTRACTION    ? ? ?Current Medications: ?Current Meds  ?Medication Sig  ? albuterol (VENTOLIN HFA) 108 (90 Base) MCG/ACT inhaler Inhale 1 puff into the lungs  every 4 (four) hours as needed for wheezing or shortness of breath.  ? Alirocumab (PRALUENT) 150 MG/ML SOAJ Inject 150 mLs into the skin every 14 (fourteen) days. 10th and 25th of each month  ? amLODipine (NORVASC) 10 MG tablet Take 10 mg by mouth daily.  ? ANORO ELLIPTA 62.5-25 MCG/ACT AEPB Inhale 1 puff into the lungs daily.  ? aspirin 81 MG chewable tablet Chew 81 mg by mouth daily.  ? atorvastatin (LIPITOR) 80 MG tablet Take 80 mg by mouth daily.  ? B Complex-C (B-COMPLEX WITH VITAMIN C) tablet Take 1 tablet by mouth daily.  ? cetirizine (ZYRTEC) 10 MG  tablet Take 10 mg by mouth 2 (two) times daily.  ? EPINEPHrine 0.3 mg/0.3 mL IJ SOAJ injection Inject 0.3 mg into the muscle as needed for anaphylaxis.  ? ezetimibe (ZETIA) 10 MG tablet Take 10 mg by mouth daily.  ? JARDIANCE 25 MG TABS tablet Take 25 mg by mouth daily.  ? losartan (COZAAR) 100 MG tablet Take 100 mg by mouth daily.  ? metFORMIN (GLUCOPHAGE) 1000 MG tablet Take 1,000 mg by mouth 2 (two) times daily with a meal.  ? Multiple Vitamin (MULTIVITAMIN WITH MINERALS) TABS tablet Take 1 tablet by mouth daily.  ? tirzepatide (MOUNJARO) 10 MG/0.5ML Pen Inject 10 mg into the skin once a week.  ? vitamin C (ASCORBIC ACID) 250 MG tablet Take 250 mg by mouth daily.  ?  ? ?Allergies:   Bee venom  ? ?Social History  ? ?Socioeconomic History  ? Marital status: Married  ?  Spouse name: Not on file  ? Number of children: 2  ? Years of education: Not on file  ? Highest education level: Not on file  ?Occupational History  ? Not on file  ?Tobacco Use  ? Smoking status: Never  ? Smokeless tobacco: Current  ?  Types: Chew  ?Vaping Use  ? Vaping Use: Never used  ?Substance and Sexual Activity  ? Alcohol use: Yes  ?  Comment: 3 drinks a week.   ? Drug use: No  ? Sexual activity: Yes  ?Other Topics Concern  ? Not on file  ?Social History Narrative  ? Not on file  ? ?Social Determinants of Health  ? ?Financial Resource Strain: Not on file  ?Food Insecurity: Not on file  ?Transportation Needs: Not on file  ?Physical Activity: Not on file  ?Stress: Not on file  ?Social Connections: Not on file  ?  ? ?Family History: ?The patient's family history includes Aneurysm in his maternal grandfather and maternal uncle; Aneurysm (age of onset: 50) in his cousin; CAD in an other family member; Diabetes Mellitus II in his father and another family member; Heart attack in his father; Heart attack (age of onset: 27) in his paternal grandfather; Heart attack (age of onset: 69) in his paternal uncle; Heart attack (age of onset: 44) in his  maternal grandmother; Heart failure in an other family member; Hypertension in his mother, paternal uncle, and another family member. There is no history of Colon polyps, Esophageal cancer, Rectal cancer, or Stomach cancer. ? ?ROS:   ?Please see the history of present illness.    ?All other systems reviewed and are negative. ? ?EKGs/Labs/Other Studies Reviewed:   ? ?The following studies were reviewed today: ? ?MRI Abdomen 02/01/2022: ?IMPRESSION: ?1. Large infiltrative mass in the lower pole of the left kidney with ?extension into the left renal hilum involving both the left renal ?collecting system and proximal aspect of the  left renal vein. ?Overall, the mass is encapsulated within Gerota's fascia and is not ?associated with lymphadenopathy or definite signs of metastatic ?disease in the abdomen. Left renal vein involvement is proximal ?(approximately 5.7 cm from the midline at this time). Urologic ?consultation for surgical resection is recommended. ?2. Bosniak class 2 cyst in the upper pole of the left kidney ?incidentally noted. ?3. Heterogeneous hepatic steatosis. ? ?CTA Chest 01/01/2022: ?FINDINGS: ?Cardiovascular: There are no filling defects within the pulmonary ?arteries to suggest pulmonary embolus. Stable 4.6 cm ascending ?aortic aneurysm without dissection or acute aortic findings. This ?causes mild mass effect on the superior vena cava. Conventional ?branching pattern from the aortic arch. Aortic valve calcifications ?again seen. There is similar contrast refluxing into the azygous and ?hemi azygous system as well as thoracic venous plexus. The heart is ?normal in size. No pericardial effusion. There are coronary artery ?calcifications. ?  ?Mediastinum/Nodes: Scattered small mediastinal lymph nodes, not ?enlarged by size criteria. No hilar or axillary adenopathy. Patulous ?esophagus without wall thickening. ?  ?Lungs/Pleura: Bronchial thickening with areas of mucous plugging in ?the lower lobes.  Heterogeneous pulmonary parenchyma. 2-3 mm right ?perifissural lung nodule, series 7, image 61, is stable from ?08/29/2020 chest CT and considered benign, consistent with ?intrapulmonary lymph node. No confluent conso

## 2022-02-07 NOTE — Assessment & Plan Note (Addendum)
Saw our geneticist.  He does not carry any specific gene that promotes aortic dissection.  We will continue to monitor closely.  In approximately 6 months we will check a CTA of chest again.  He has a bicuspid valve.  I will likely at that time refer for first visit with CT surgery.  With his bicuspid aortic valve, moderate stenosis he will require replacement valve as well as graft.  There is a lower cut off for bicuspid aortic valves.  Some literature says 5 cm instead of 5.5.  We will continue with excellent blood pressure control. ?

## 2022-02-07 NOTE — Assessment & Plan Note (Signed)
He may proceed with nephrectomy/renal surgery by urology under general anesthesia with low overall cardiac risk.  He has a stable thoracic ascending aortic aneurysm and bicuspid aortic valve with nonsevere stenosis. ?

## 2022-02-08 NOTE — Anesthesia Preprocedure Evaluation (Addendum)
Anesthesia Evaluation  Patient identified by MRN, date of birth, ID band Patient awake    Reviewed: Allergy & Precautions, NPO status , Patient's Chart, lab work & pertinent test results  Airway Mallampati: I  TM Distance: >3 FB Neck ROM: Full    Dental  (+) Teeth Intact   Pulmonary    breath sounds clear to auscultation       Cardiovascular hypertension, Pt. on medications  Rhythm:Regular Rate:Normal  Echo: 1. Left ventricular ejection fraction, by estimation, is 55 to 60%. The  left ventricle has normal function. The left ventricle has no regional  wall motion abnormalities. There is mild asymmetric left ventricular  hypertrophy of the basal-septal segment.  Left ventricular diastolic parameters are consistent with Grade I  diastolic dysfunction (impaired relaxation).  2. Right ventricular systolic function is normal. The right ventricular  size is normal.  3. Left atrial size was mild to moderately dilated.  4. The mitral valve is normal in structure. Trivial mitral valve  regurgitation.  5. The aortic valve was not well visualized. There is moderate  calcification of the aortic valve. There is moderate thickening of the  aortic valve. Aortic valve regurgitation is trivial. Moderate aortic valve  stenosis. AVA 1.12cm2, mean gradient 35mHg,  peak gradient 445mg, Vmax 3.57m61m DI 0.33.  6. Aortic dilatation noted. There is borderline dilatation of the  ascending aorta, measuring 36 mm.    Neuro/Psych    GI/Hepatic   Endo/Other  diabetes, Type 2, Oral Hypoglycemic Agents  Renal/GU      Musculoskeletal   Abdominal Normal abdominal exam  (+)   Peds  Hematology   Anesthesia Other Findings   Reproductive/Obstetrics                           Anesthesia Physical Anesthesia Plan  ASA: 2  Anesthesia Plan: General   Post-op Pain Management:    Induction: Intravenous  PONV Risk  Score and Plan: 3 and Ondansetron, Dexamethasone and Midazolam  Airway Management Planned: LMA  Additional Equipment: None  Intra-op Plan:   Post-operative Plan: Extubation in OR  Informed Consent: I have reviewed the patients History and Physical, chart, labs and discussed the procedure including the risks, benefits and alternatives for the proposed anesthesia with the patient or authorized representative who has indicated his/her understanding and acceptance.     Dental advisory given  Plan Discussed with: CRNA  Anesthesia Plan Comments: (See PAT note 01/30/2022)       Anesthesia Quick Evaluation

## 2022-02-08 NOTE — Progress Notes (Signed)
Anesthesia Chart Review   Case: [id number] Date/Time: 02/12/22 1030   Procedure: CYSTOSCOPY WITH LEFT  URETEROSCOPY POSSIBLE BIOPSYAND STENT PLACEMENT (Left)   Anesthesia type: General   Pre-op diagnosis: LEFT RENAL STONE   Location: [facility]   Surgeons: Lucas Mallow, MD       DISCUSSION: 56 y.o. never smoker with h/o HTN, DM II, moderate aortic stenosis (AVA 1.12 cm2, mean gradient 26 mmHg), AAA (stable 4.6 cm AAA), left renal stone scheduled for above procedure 02/12/2022 with Dr. Link Snuffer.   Pt seen by cardiology 02/07/2022. Per OV note, "Preop cardiovascular exam. He may proceed with nephrectomy/renal surgery by urology under general anesthesia with low overall cardiac risk.  He has a stable thoracic ascending aortic aneurysm and bicuspid aortic valve with nonsevere stenosis."  Anticipate pt can proceed with planned procedure barring acute status change.   VS: BP 137/84   Pulse 78   Temp 36.8 C (Oral)   Resp 18   Ht '5\' 10"'$  (1.778 m)   Wt 107.6 kg   SpO2 98%   BMI 34.02 kg/m   PROVIDERS: [name], MD is PCP   [facility] Cardiologist:  Candee Furbish, MD   LABS: Labs reviewed: Acceptable for surgery. (all labs ordered are listed, but only abnormal results are displayed)  Labs Reviewed  BASIC METABOLIC PANEL - Abnormal; Notable for the following components:      Result Value   Glucose, Bld 143 (*)    All other components within normal limits  GLUCOSE, CAPILLARY - Abnormal; Notable for the following components:   Glucose-Capillary 160 (*)    All other components within normal limits  CBC     IMAGES: CT Angio 02/01/22 IMPRESSION: 1. No pulmonary embolus. 2. Bronchial thickening with areas of mucous plugging in the lower lobes. Heterogeneous pulmonary parenchyma, suggesting small airways disease. 3. Stable 4.6 cm ascending aortic aneurysm. No dissection or acute aortic findings. Ascending thoracic aortic aneurysm. Recommend semi-annual  imaging followup by CTA or MRA and referral to cardiothoracic surgery if not already obtained. This recommendation follows 2010 ACCF/AHA/AATS/ACR/ASA/SCA/SCAI/SIR/STS/SVM Guidelines for the Diagnosis and Management of Patients With Thoracic Aortic Disease. Circulation. 2010; 121: F093-A355. Aortic aneurysm NOS (ICD10-I71.9) 4. Coronary artery calcifications.  EKG: 01/01/2022 Rate 89 bpm  NSR LAD Possible anterolateral infarct, age undetermined   CV: Echo 09/29/2021 1. Left ventricular ejection fraction, by estimation, is 55 to 60%. The  left ventricle has normal function. The left ventricle has no regional  wall motion abnormalities. There is mild asymmetric left ventricular  hypertrophy of the basal-septal segment.  Left ventricular diastolic parameters are consistent with Grade I  diastolic dysfunction (impaired relaxation).   2. Right ventricular systolic function is normal. The right ventricular  size is normal.   3. Left atrial size was mild to moderately dilated.   4. The mitral valve is normal in structure. Trivial mitral valve  regurgitation.   5. The aortic valve was not well visualized. There is moderate  calcification of the aortic valve. There is moderate thickening of the  aortic valve. Aortic valve regurgitation is trivial. Moderate aortic valve  stenosis. AVA 1.12cm2, mean gradient 58mHg,   peak gradient 462mg, Vmax 3.13m42m DI 0.33.   6. Aortic dilatation noted. There is borderline dilatation of the  ascending aorta, measuring 36 mm.   Myocardial Perfusion 09/30/2020 The left ventricular ejection fraction is normal (55-65%). Nuclear stress EF: 55%. There was no ST segment deviation noted during stress. No T  wave inversion was noted during stress. The study is normal. This is a low risk study.   1.  There are reduced counts in the apex on rest and stress imaging with normal wall motion consistent with apical thinning artifact.  Overall, this is a normal study  without evidence of ischemia or prior infarction. 2.  Normal LVEF, 55%. 3.  This is a low risk study. Past Medical History:  Diagnosis Date   Allergy    Blood transfusion without reported diagnosis    Cancer ([place])    Diabetes mellitus without complication ([place])    Hyperlipidemia    Hypertension    Obesity     Past Surgical History:  Procedure Laterality Date   TONSILLECTOMY     WISDOM TOOTH EXTRACTION      MEDICATIONS:  albuterol (VENTOLIN HFA) 108 (90 Base) MCG/ACT inhaler   Alirocumab (PRALUENT) 150 MG/ML SOAJ   amLODipine (NORVASC) 10 MG tablet   ANORO ELLIPTA 62.5-25 MCG/ACT AEPB   aspirin 81 MG chewable tablet   atorvastatin (LIPITOR) 80 MG tablet   B Complex-C (B-COMPLEX WITH VITAMIN C) tablet   cetirizine (ZYRTEC) 10 MG tablet   EPINEPHrine 0.3 mg/0.3 mL IJ SOAJ injection   ezetimibe (ZETIA) 10 MG tablet   JARDIANCE 25 MG TABS tablet   losartan (COZAAR) 100 MG tablet   metFORMIN (GLUCOPHAGE) 1000 MG tablet   Multiple Vitamin (MULTIVITAMIN WITH MINERALS) TABS tablet   tirzepatide (MOUNJARO) 10 MG/0.5ML Pen   vitamin C (ASCORBIC ACID) 250 MG tablet   No current facility-administered medications for this encounter.    Russell Felix Ward, PA-C [facility] Pre-Surgical Testing [phone]

## 2022-02-12 ENCOUNTER — Ambulatory Visit (HOSPITAL_COMMUNITY): Payer: BC Managed Care – PPO | Admitting: Anesthesiology

## 2022-02-12 ENCOUNTER — Ambulatory Visit (HOSPITAL_COMMUNITY): Payer: BC Managed Care – PPO

## 2022-02-12 ENCOUNTER — Encounter (HOSPITAL_COMMUNITY): Payer: Self-pay | Admitting: Urology

## 2022-02-12 ENCOUNTER — Ambulatory Visit (HOSPITAL_COMMUNITY)
Admission: RE | Admit: 2022-02-12 | Discharge: 2022-02-12 | Disposition: A | Discharge status: Home / Self Care | Payer: BC Managed Care – PPO | Attending: Urology | Admitting: Urology

## 2022-02-12 ENCOUNTER — Ambulatory Visit (HOSPITAL_COMMUNITY): Payer: BC Managed Care – PPO | Admitting: Physician Assistant

## 2022-02-12 ENCOUNTER — Encounter (HOSPITAL_COMMUNITY)
Admission: RE | Disposition: A | Discharge status: Home / Self Care | Payer: Self-pay | Source: Home / Self Care | Attending: Urology

## 2022-02-12 DIAGNOSIS — Z7984 Long term (current) use of oral hypoglycemic drugs: Secondary | ICD-10-CM | POA: Insufficient documentation

## 2022-02-12 DIAGNOSIS — E119 Type 2 diabetes mellitus without complications: Secondary | ICD-10-CM | POA: Insufficient documentation

## 2022-02-12 DIAGNOSIS — I10 Essential (primary) hypertension: Secondary | ICD-10-CM | POA: Insufficient documentation

## 2022-02-12 DIAGNOSIS — N2889 Other specified disorders of kidney and ureter: Secondary | ICD-10-CM | POA: Diagnosis not present

## 2022-02-12 HISTORY — PX: CYSTOSCOPY WITH URETEROSCOPY AND STENT PLACEMENT: SHX6377

## 2022-02-12 LAB — GLUCOSE, CAPILLARY
Glucose-Capillary: 119 mg/dL — ABNORMAL HIGH (ref 70–99)
Glucose-Capillary: 125 mg/dL — ABNORMAL HIGH (ref 70–99)

## 2022-02-12 SURGERY — CYSTOURETEROSCOPY, WITH STENT INSERTION
Anesthesia: General | Site: Urethra | Laterality: Left

## 2022-02-12 MED ORDER — LABETALOL HCL 5 MG/ML IV SOLN
INTRAVENOUS | Status: AC
  Administered 2022-02-12: 5 mg via INTRAVENOUS
  Filled 2022-02-12: qty 4

## 2022-02-12 MED ORDER — PROPOFOL 10 MG/ML IV BOLUS
INTRAVENOUS | Status: DC | PRN
  Administered 2022-02-12: 200 mg via INTRAVENOUS

## 2022-02-12 MED ORDER — ORAL CARE MOUTH RINSE: 15.0000 mL | Freq: Once | OROMUCOSAL | Status: AC

## 2022-02-12 MED ORDER — LACTATED RINGERS IV SOLN: INTRAVENOUS | Status: DC

## 2022-02-12 MED ORDER — TAMSULOSIN HCL 0.4 MG PO CAPS: 0.4000 mg | ORAL_CAPSULE | Freq: Every day | ORAL | 0 refills | Status: DC

## 2022-02-12 MED ORDER — KETOROLAC TROMETHAMINE 30 MG/ML IJ SOLN
INTRAMUSCULAR | Status: AC
  Filled 2022-02-12: qty 1

## 2022-02-12 MED ORDER — OXYCODONE HCL 5 MG PO TABS: 5.0000 mg | ORAL_TABLET | Freq: Once | ORAL | Status: DC | PRN

## 2022-02-12 MED ORDER — KETOROLAC TROMETHAMINE 30 MG/ML IJ SOLN
INTRAMUSCULAR | Status: DC | PRN
  Administered 2022-02-12: 30 mg via INTRAVENOUS

## 2022-02-12 MED ORDER — IOHEXOL 300 MG/ML  SOLN
INTRAMUSCULAR | Status: DC | PRN
Start: 2022-02-12 — End: 2022-02-12
  Administered 2022-02-12: 10 mL via URETHRAL

## 2022-02-12 MED ORDER — LIDOCAINE HCL (PF) 2 % IJ SOLN
INTRAMUSCULAR | Status: AC
  Filled 2022-02-12: qty 5

## 2022-02-12 MED ORDER — OXYCODONE HCL 5 MG PO TABS
5.0000 mg | ORAL_TABLET | ORAL | 0 refills | Status: DC | PRN
Start: 2022-02-12 — End: 2022-03-12

## 2022-02-12 MED ORDER — ACETAMINOPHEN 160 MG/5ML PO SOLN: 325.0000 mg | ORAL | Status: DC | PRN

## 2022-02-12 MED ORDER — LIDOCAINE HCL (CARDIAC) PF 100 MG/5ML IV SOSY
PREFILLED_SYRINGE | INTRAVENOUS | Status: DC | PRN
Start: 2022-02-12 — End: 2022-02-12
  Administered 2022-02-12: 40 mg via INTRAVENOUS

## 2022-02-12 MED ORDER — ACETAMINOPHEN 10 MG/ML IV SOLN: 1000.0000 mg | Freq: Once | INTRAVENOUS | Status: DC | PRN

## 2022-02-12 MED ORDER — FENTANYL CITRATE (PF) 100 MCG/2ML IJ SOLN
INTRAMUSCULAR | Status: AC
  Filled 2022-02-12: qty 2

## 2022-02-12 MED ORDER — ACETAMINOPHEN 325 MG PO TABS: 325.0000 mg | ORAL_TABLET | ORAL | Status: DC | PRN

## 2022-02-12 MED ORDER — SODIUM CHLORIDE 0.9 % IR SOLN
Status: DC | PRN
Start: 2022-02-12 — End: 2022-02-12
  Administered 2022-02-12: 1000 mL
  Administered 2022-02-12: 3000 mL via INTRAVESICAL

## 2022-02-12 MED ORDER — PROPOFOL 500 MG/50ML IV EMUL
INTRAVENOUS | Status: AC
  Filled 2022-02-12: qty 50

## 2022-02-12 MED ORDER — DEXAMETHASONE SODIUM PHOSPHATE 10 MG/ML IJ SOLN
INTRAMUSCULAR | Status: AC
  Filled 2022-02-12: qty 1

## 2022-02-12 MED ORDER — MIDAZOLAM HCL 5 MG/5ML IJ SOLN
INTRAMUSCULAR | Status: DC | PRN
Start: 2022-02-12 — End: 2022-02-12
  Administered 2022-02-12: 2 mg via INTRAVENOUS

## 2022-02-12 MED ORDER — LABETALOL HCL 5 MG/ML IV SOLN
5.0000 mg | INTRAVENOUS | Status: AC | PRN
  Administered 2022-02-12: 5 mg via INTRAVENOUS

## 2022-02-12 MED ORDER — CEFAZOLIN SODIUM-DEXTROSE 2-4 GM/100ML-% IV SOLN
2.0000 g | INTRAVENOUS | Status: AC
  Administered 2022-02-12: 2 g via INTRAVENOUS
  Filled 2022-02-12: qty 100

## 2022-02-12 MED ORDER — MIDAZOLAM HCL 2 MG/2ML IJ SOLN
INTRAMUSCULAR | Status: AC
Start: 2022-02-12 — End: ?
  Filled 2022-02-12: qty 2

## 2022-02-12 MED ORDER — AMISULPRIDE (ANTIEMETIC) 5 MG/2ML IV SOLN: 10.0000 mg | Freq: Once | INTRAVENOUS | Status: DC | PRN

## 2022-02-12 MED ORDER — OXYCODONE HCL 5 MG/5ML PO SOLN: 5.0000 mg | Freq: Once | ORAL | Status: DC | PRN

## 2022-02-12 MED ORDER — DEXAMETHASONE SODIUM PHOSPHATE 10 MG/ML IJ SOLN
INTRAMUSCULAR | Status: DC | PRN
  Administered 2022-02-12: 10 mg via INTRAVENOUS

## 2022-02-12 MED ORDER — FENTANYL CITRATE PF 50 MCG/ML IJ SOSY: 25.0000 ug | PREFILLED_SYRINGE | INTRAMUSCULAR | Status: DC | PRN

## 2022-02-12 MED ORDER — ONDANSETRON HCL 4 MG/2ML IJ SOLN
INTRAMUSCULAR | Status: DC | PRN
  Administered 2022-02-12: 4 mg via INTRAVENOUS

## 2022-02-12 MED ORDER — CHLORHEXIDINE GLUCONATE 0.12 % MT SOLN
15.0000 mL | Freq: Once | OROMUCOSAL | Status: AC
  Administered 2022-02-12: 15 mL via OROMUCOSAL

## 2022-02-12 MED ORDER — ONDANSETRON HCL 4 MG/2ML IJ SOLN
INTRAMUSCULAR | Status: AC
  Filled 2022-02-12: qty 2

## 2022-02-12 MED ORDER — FENTANYL CITRATE (PF) 100 MCG/2ML IJ SOLN
INTRAMUSCULAR | Status: AC
Start: 2022-02-12 — End: ?
  Filled 2022-02-12: qty 2

## 2022-02-12 MED ORDER — FENTANYL CITRATE (PF) 100 MCG/2ML IJ SOLN
INTRAMUSCULAR | Status: DC | PRN
  Administered 2022-02-12 (×4): 50 ug via INTRAVENOUS

## 2022-02-12 SURGICAL SUPPLY — 24 items
BAG URO CATCHER STRL LF (MISCELLANEOUS) ×2 IMPLANT
BASKET LASER NITINOL 1.9FR (BASKET) IMPLANT
BASKET ZERO TIP NITINOL 2.4FR (BASKET) IMPLANT
BSKT STON RTRVL 120 1.9FR (BASKET)
BSKT STON RTRVL ZERO TP 2.4FR (BASKET)
CATH URETL OPEN END 6FR 70 (CATHETERS) ×2 IMPLANT
CLOTH BEACON ORANGE TIMEOUT ST (SAFETY) ×1 IMPLANT
EXTRACTOR STONE 1.7FRX115CM (UROLOGICAL SUPPLIES) IMPLANT
GLOVE BIO SURGEON STRL SZ7.5 (GLOVE) ×3 IMPLANT
GOWN STRL REUS W/ TWL XL LVL3 (GOWN DISPOSABLE) ×1 IMPLANT
GOWN STRL REUS W/TWL XL LVL3 (GOWN DISPOSABLE) ×4
GUIDEWIRE ANG ZIPWIRE 038X150 (WIRE) IMPLANT
GUIDEWIRE STR DUAL SENSOR (WIRE) ×3 IMPLANT
KIT TURNOVER KIT A (KITS) ×1 IMPLANT
LASER FIB FLEXIVA PULSE ID 365 (Laser) IMPLANT
MANIFOLD NEPTUNE II (INSTRUMENTS) ×2 IMPLANT
PACK CYSTO (CUSTOM PROCEDURE TRAY) ×2 IMPLANT
SHEATH NAVIGATOR HD 11/13X28 (SHEATH) IMPLANT
SHEATH NAVIGATOR HD 11/13X36 (SHEATH) ×1 IMPLANT
STENT URET 6FRX26 CONTOUR (STENTS) ×1 IMPLANT
TRACTIP FLEXIVA PULS ID 200XHI (Laser) IMPLANT
TRACTIP FLEXIVA PULSE ID 200 (Laser)
TUBING CONNECTING 10 (TUBING) ×2 IMPLANT
TUBING UROLOGY SET (TUBING) ×2 IMPLANT

## 2022-02-12 NOTE — Op Note (Signed)
Operative Note  Preoperative diagnosis:  1.  Left renal mass  Postoperative diagnosis: 1.  Left renal mass  Procedure(s): 1.  Cystoscopy with left retrograde pyelogram, left diagnostic ureteroscopy, ureteral stent placement  Surgeon: Link Snuffer, MD  Assistants: None  Anesthesia: General  Complications: None immediate  EBL: Minimal  Specimens: 1.  None  Drains/Catheters: 1.  6 x 26 double-J ureteral stent on the left  Intraoperative findings: 1.  Normal anterior urethra 2.  Nonobstructing prostate 3.  Normal bladder mucosa without any tumors or stones. 4.  Retrograde pyelogram revealed no evidence of filling defect. 5.  Diagnostic ureteroscopy revealed no obvious ureteral or renal lesions  Indication: 56 year old male with a left renal mass.  It is infiltrative on CT and he presents for diagnostic ureteroscopy to rule out urothelial cell component.  Description of procedure:  The patient was identified and consent was obtained.  The patient was taken to the operating room and placed in the supine position.  The patient was placed under general anesthesia.  Perioperative antibiotics were administered.  The patient was placed in dorsal lithotomy.  Patient was prepped and draped in a standard sterile fashion and a timeout was performed.  A 21 French rigid cystoscope was advanced into the urethra and into the bladder.  Complete cystoscopy was performed with findings noted above.  The left ureter was cannulated with a sensor wire which was advanced up to the kidney under fluoroscopic guidance.  Scope was withdrawn and wire was secured to the drape.  Semirigid ureteroscopy was performed alongside the wire up to the renal pelvis.  No ureteral stones or ureteral lesions were identified.  A second wire was advanced through the scope and into the kidney and the scope was withdrawn.  I advanced an 11 x 13 ureteral access sheath over the wire under continuous fluoroscopic guidance into the  kidney.  I performed a complete pyeloscopy.  No obvious renal collecting system lesions were seen.  No stones were identified.  Retrograde pyelogram was performed through the scope with no abnormal findings noted.  I again inspected every calyx and confirmed this by fluoroscopy.  Again, no urothelial lesions were seen.  I withdrew the scope along with the access sheath visualizing the entire ureter upon removal.  There were no ureteral injury seen.  I backloaded the wire onto rigid cystoscope and advanced that into the bladder followed by routine placement of a 6 x 26 double-J ureteral stent.  Fluoroscopy confirmed proximal placement and direct visualization confirmed a good coil within the bladder.  I drained the bladder and withdrew the scope.  Patient tolerated the procedure well was stable postoperative.  Plan: Follow-up as scheduled for left radical nephrectomy.

## 2022-02-12 NOTE — H&P (Signed)
H&P  Chief Complaint: Left renal mass  History of Present Illness: 56 year old male was found to have a left renal mass.  CT and MRI revealed a somewhat infiltrative renal mass.  He presents for diagnostic ureteroscopy to rule out urothelial tumor prior to proceeding with nephrectomy.  Past Medical History:  Diagnosis Date   Allergy    Blood transfusion without reported diagnosis    Cancer ([place])    Diabetes mellitus without complication ([place])    Hyperlipidemia    Hypertension    Obesity    Past Surgical History:  Procedure Laterality Date   TONSILLECTOMY     WISDOM TOOTH EXTRACTION      Home Medications:  Medications Prior to Admission  Medication Sig Dispense Refill Last Dose   albuterol (VENTOLIN HFA) 108 (90 Base) MCG/ACT inhaler Inhale 1 puff into the lungs every 4 (four) hours as needed for wheezing or shortness of breath.      Alirocumab (PRALUENT) 150 MG/ML SOAJ Inject 150 mLs into the skin every 14 (fourteen) days. 10th and 25th of each month      amLODipine (NORVASC) 10 MG tablet Take 10 mg by mouth daily.      ANORO ELLIPTA 62.5-25 MCG/ACT AEPB Inhale 1 puff into the lungs daily.      aspirin 81 MG chewable tablet Chew 81 mg by mouth daily.   01/22/22   atorvastatin (LIPITOR) 80 MG tablet Take 80 mg by mouth daily.      B Complex-C (B-COMPLEX WITH VITAMIN C) tablet Take 1 tablet by mouth daily.      cetirizine (ZYRTEC) 10 MG tablet Take 10 mg by mouth 2 (two) times daily.      EPINEPHrine 0.3 mg/0.3 mL IJ SOAJ injection Inject 0.3 mg into the muscle as needed for anaphylaxis.      ezetimibe (ZETIA) 10 MG tablet Take 10 mg by mouth daily.      JARDIANCE 25 MG TABS tablet Take 25 mg by mouth daily.      losartan (COZAAR) 100 MG tablet Take 100 mg by mouth daily.      metFORMIN (GLUCOPHAGE) 1000 MG tablet Take 1,000 mg by mouth 2 (two) times daily with a meal.      Multiple Vitamin (MULTIVITAMIN WITH MINERALS) TABS tablet Take 1 tablet by mouth daily.      tirzepatide  (MOUNJARO) 10 MG/0.5ML Pen Inject 10 mg into the skin once a week. 6 mL 3    vitamin C (ASCORBIC ACID) 250 MG tablet Take 250 mg by mouth daily.      Allergies:  Allergies  Allergen Reactions   Bee Venom Anaphylaxis    Family History  Problem Relation Age of Onset   CAD Other    Diabetes Mellitus II Other    Hypertension Other    Heart failure Other    Hypertension Mother    Heart attack Father        14, 30 at both ages   Diabetes Mellitus II Father        insulin dependent   Aneurysm Maternal Uncle        aortic   Heart attack Paternal Uncle 68       minor   Hypertension Paternal Uncle    Heart attack Maternal Grandmother 82       massive   Aneurysm Maternal Grandfather        abdominal   Heart attack Paternal Grandfather 65   Aneurysm Cousin 22  aortic   Colon polyps Neg Hx    Esophageal cancer Neg Hx    Rectal cancer Neg Hx    Stomach cancer Neg Hx    Social History:  reports that he has never smoked. His smokeless tobacco use includes chew. He reports current alcohol use. He reports that he does not use drugs.  ROS: A complete review of systems was performed.  All systems are negative except for pertinent findings as noted. ROS   Physical Exam:  Vital signs in last 24 hours:   General:  Alert and oriented, No acute distress HEENT: Normocephalic, atraumatic Neck: No JVD or lymphadenopathy Cardiovascular: Regular rate and rhythm Lungs: Regular rate and effort Abdomen: Soft, nontender, nondistended, no abdominal masses Back: No CVA tenderness Extremities: No edema Neurologic: Grossly intact  Laboratory Data:  No results found for this or any previous visit (from the past 24 hour(s)). No results found for this or any previous visit (from the past 240 hour(s)). Creatinine: No results for input(s): CREATININE in the last 168 hours.  Impression/Assessment:  Left renal mass  Plan:  Proceed with cystoscopy with left retrograde pyelogram, left  ureteroscopy with possible interventions, ureteral stent placement.  Marton Redwood, III 02/12/2022, 8:46 AM

## 2022-02-12 NOTE — Discharge Instructions (Signed)

## 2022-02-12 NOTE — Anesthesia Postprocedure Evaluation (Signed)
Anesthesia Post Note  Patient: Radio producer III  Procedure(s) Performed: CYSTOSCOPY WITH LEFT RETROGRADE PYELOGRAM, DIAGNOSTIC URETEROSCOPY AND STENT PLACEMENT (Left: Urethra)     Patient location during evaluation: PACU Anesthesia Type: General Level of consciousness: awake and alert Pain management: pain level controlled Vital Signs Assessment: post-procedure vital signs reviewed and stable Respiratory status: spontaneous breathing, nonlabored ventilation, respiratory function stable and patient connected to nasal cannula oxygen Cardiovascular status: blood pressure returned to baseline and stable Postop Assessment: no apparent nausea or vomiting Anesthetic complications: no   No notable events documented.  Last Vitals:  Vitals:   02/12/22 1143 02/12/22 1222  BP: (!) 164/92 (!) 180/94  Pulse:  66  Resp:    Temp:  36.7 C  SpO2:  97%    Last Pain:  Vitals:   02/12/22 1222  TempSrc:   PainSc: 0-No pain                 Effie Berkshire

## 2022-02-12 NOTE — Anesthesia Procedure Notes (Signed)
Procedure Name: LMA Insertion Date/Time: 02/12/2022 10:04 AM Performed by: Jonna Munro, CRNA Pre-anesthesia Checklist: Patient identified, Emergency Drugs available, Suction available, Patient being monitored and Timeout performed Patient Re-evaluated:Patient Re-evaluated prior to induction Oxygen Delivery Method: Circle system utilized Preoxygenation: Pre-oxygenation with 100% oxygen Induction Type: IV induction LMA: LMA inserted LMA Size: 5.0 Number of attempts: 1 Placement Confirmation: positive ETCO2 and breath sounds checked- equal and bilateral Tube secured with: Tape Dental Injury: Teeth and Oropharynx as per pre-operative assessment

## 2022-02-12 NOTE — Transfer of Care (Signed)
Immediate Anesthesia Transfer of Care Note  Patient: Trixie Dredge III  Procedure(s) Performed: CYSTOSCOPY WITH LEFT RETROGRADE PYELOGRAM, DIAGNOSTIC URETEROSCOPY AND STENT PLACEMENT (Left: Urethra)  Patient Location: PACU  Anesthesia Type:General  Level of Consciousness: awake, alert , oriented and patient cooperative  Airway & Oxygen Therapy: Patient Spontanous Breathing and Patient connected to face mask oxygen  Post-op Assessment: Report given to RN, Post -op Vital signs reviewed and stable and Patient moving all extremities X 4  Post vital signs: Reviewed and stable  Last Vitals:  Vitals Value Taken Time  BP 163/109 02/12/22 1057  Temp    Pulse 76 02/12/22 1059  Resp 8 02/12/22 1059  SpO2 99 % 02/12/22 1059  Vitals shown include unvalidated device data.  Last Pain:  Vitals:   02/12/22 0909  TempSrc:   PainSc: 0-No pain         Complications: No notable events documented.

## 2022-02-13 ENCOUNTER — Encounter (HOSPITAL_COMMUNITY): Payer: Self-pay | Admitting: Urology

## 2022-03-01 DIAGNOSIS — D225 Melanocytic nevi of trunk: Secondary | ICD-10-CM | POA: Diagnosis not present

## 2022-03-01 DIAGNOSIS — D2271 Melanocytic nevi of right lower limb, including hip: Secondary | ICD-10-CM | POA: Diagnosis not present

## 2022-03-01 DIAGNOSIS — D2239 Melanocytic nevi of other parts of face: Secondary | ICD-10-CM | POA: Diagnosis not present

## 2022-03-01 DIAGNOSIS — D224 Melanocytic nevi of scalp and neck: Secondary | ICD-10-CM | POA: Diagnosis not present

## 2022-03-01 DIAGNOSIS — D485 Neoplasm of uncertain behavior of skin: Secondary | ICD-10-CM | POA: Diagnosis not present

## 2022-03-06 NOTE — Patient Instructions (Addendum)
DUE TO COVID-19 ONLY TWO VISITORS  (aged 55 and older)  ARE ALLOWED TO COME WITH YOU AND STAY IN THE WAITING ROOM ONLY DURING PRE OP AND PROCEDURE.   **NO VISITORS ARE ALLOWED IN THE SHORT STAY AREA OR RECOVERY ROOM!!**  IF YOU WILL BE ADMITTED INTO THE HOSPITAL YOU ARE ALLOWED ONLY FOUR SUPPORT PEOPLE DURING VISITATION HOURS ONLY (7 AM -8PM)   The support person(s) must pass our screening, gel in and out, and wear a mask at all times, including in the patient's room. Patients must also wear a mask when staff or their support person are in the room. Visitors GUEST BADGE MUST BE WORN VISIBLY  One adult visitor may remain with you overnight and MUST be in the room by 8 P.M.     Your procedure is scheduled on: 03/12/22   Report to [facility] Main Entrance    Report to Short stay at : 5:15 AM   Call this number if you have problems the morning of surgery [phone]   Clear liquids starting day before surgery until : 4:30 AM DAY OF SURGERY  Water Black Coffee (sugar ok, NO MILK/CREAM OR CREAMERS)  Tea (sugar ok, NO MILK/CREAM OR CREAMERS) regular and decaf                             Plain Jell-O (NO RED)                                           Fruit ices (not with fruit pulp, NO RED)                                     Popsicles (NO RED)                                                                  Juice: apple, WHITE grape, WHITE cranberry Sports drinks like Gatorade (NO RED) Clear broth(vegetable,chicken,beef)  FOLLOW BOWEL PREP AND ANY ADDITIONAL PRE OP INSTRUCTIONS YOU RECEIVED FROM YOUR SURGEON'S OFFICE!!!   Oral Hygiene is also important to reduce your risk of infection.                                    Remember - BRUSH YOUR TEETH THE MORNING OF SURGERY WITH YOUR REGULAR TOOTHPASTE   Do NOT smoke after Midnight   Take these medicines the morning of surgery with A SIP OF WATER: cetirizine,amlodipine,Flomax.Use inhalers as usual  How to Manage Your  Diabetes Before and After Surgery  Why is it important to control my blood sugar before and after surgery? Improving blood sugar levels before and after surgery helps healing and can limit problems. A way of improving blood sugar control is eating a healthy diet by:  Eating less sugar and carbohydrates  Increasing activity/exercise  Talking with your doctor about reaching your blood sugar goals High blood sugars (greater than 180 mg/dL) can raise your risk of infections  and slow your recovery, so you will need to focus on controlling your diabetes during the weeks before surgery. Make sure that the doctor who takes care of your diabetes knows about your planned surgery including the date and location.  How do I manage my blood sugar before surgery? Check your blood sugar at least 4 times a day, starting 2 days before surgery, to make sure that the level is not too high or low. Check your blood sugar the morning of your surgery when you wake up and every 2 hours until you get to the Short Stay unit. If your blood sugar is less than 70 mg/dL, you will need to treat for low blood sugar: Do not take insulin. Treat a low blood sugar (less than 70 mg/dL) with  cup of clear juice (cranberry or apple), 4 glucose tablets, OR glucose gel. Recheck blood sugar in 15 minutes after treatment (to make sure it is greater than 70 mg/dL). If your blood sugar is not greater than 70 mg/dL on recheck, call [phone] for further instructions. Report your blood sugar to the short stay nurse when you get to Short Stay.  If you are admitted to the hospital after surgery: Your blood sugar will be checked by the staff and you will probably be given insulin after surgery (instead of oral diabetes medicines) to make sure you have good blood sugar levels. The goal for blood sugar control after surgery is 80-180 mg/dL.   WHAT DO I DO ABOUT MY DIABETES MEDICATION?  Do not take oral diabetes medicines (pills) the  morning of surgery.  THE NIGHT BEFORE SURGERY, take metformin as usual.DO NOT take jardiance.      THE MORNING OF SURGERY,DO NOT TAKE ANY ORAL DIABETIC MEDICATIONS DAY OF YOUR SURGERY  The day of surgery, do not take other diabetes injectables, including Byetta (exenatide), Bydureon (exenatide ER), Victoza (liraglutide), or Trulicity (dulaglutide).  Bring CPAP mask and tubing day of surgery.                              You may not have any metal on your body including hair pins, jewelry, and body piercing             Do not wear lotions, powders, perfumes/cologne, or deodorant               Men may shave face and neck.   Do not bring valuables to the hospital. [place].   Contacts, dentures or bridgework may not be worn into surgery.   Bring small overnight bag day of surgery.   DO NOT [place]. PHARMACY WILL DISPENSE MEDICATIONS LISTED ON YOUR MEDICATION LIST TO YOU DURING YOUR ADMISSION [place]!    Patients discharged on the day of surgery will not be allowed to drive home.  Someone NEEDS to stay with you for the first 24 hours after anesthesia.   Special Instructions: Bring a copy of your healthcare power of attorney and living will documents         the day of surgery if you haven't scanned them before.              Please read over the following fact sheets you were given: IF YOU HAVE [place] [phone]  [place] - Preparing for Surgery Before surgery, you can play an important role.  Because skin is not sterile, your skin needs to be as free of germs as possible.  You can reduce the number of germs on your skin by washing with CHG (chlorahexidine gluconate) soap before surgery.  CHG is an antiseptic cleaner which kills germs and bonds with the skin to continue killing germs even after washing. Please DO NOT use if you have an  allergy to CHG or antibacterial soaps.  If your skin becomes reddened/irritated stop using the CHG and inform your nurse when you arrive at Short Stay. Do not shave (including legs and underarms) for at least 48 hours prior to the first CHG shower.  You may shave your face/neck. Please follow these instructions carefully:  1.  Shower with CHG Soap the night before surgery and the  morning of Surgery.  2.  If you choose to wash your hair, wash your hair first as usual with your  normal  shampoo.  3.  After you shampoo, rinse your hair and body thoroughly to remove the  shampoo.                           4.  Use CHG as you would any other liquid soap.  You can apply chg directly  to the skin and wash                       Gently with a scrungie or clean washcloth.  5.  Apply the CHG Soap to your body ONLY FROM THE NECK DOWN.   Do not use on face/ open                           Wound or open sores. Avoid contact with eyes, ears mouth and genitals (private parts).                       Wash face,  Genitals (private parts) with your normal soap.             6.  Wash thoroughly, paying special attention to the area where your surgery  will be performed.  7.  Thoroughly rinse your body with warm water from the neck down.  8.  DO NOT shower/wash with your normal soap after using and rinsing off  the CHG Soap.                9.  Pat yourself dry with a clean towel.            10.  Wear clean pajamas.            11.  Place clean sheets on your bed the night of your first shower and do not  sleep with pets. Day of Surgery : Do not apply any lotions/deodorants the morning of surgery.  Please wear clean clothes to the hospital/surgery center.  FAILURE TO FOLLOW THESE INSTRUCTIONS MAY RESULT IN THE CANCELLATION OF YOUR SURGERY PATIENT SIGNATURE_________________________________  NURSE SIGNATURE__________________________________  ________________________________________________________________________  WHAT  IS A BLOOD TRANSFUSION? Blood Transfusion Information  A transfusion is the replacement of blood or some of its parts. Blood is made up of multiple cells which provide different functions. Red blood cells carry oxygen and are used for blood loss replacement. White blood cells fight against infection. Platelets control bleeding. Plasma helps clot  blood. Other blood products are available for specialized needs, such as hemophilia or other clotting disorders. BEFORE THE TRANSFUSION  Who gives blood for transfusions?  Healthy volunteers who are fully evaluated to make sure their blood is safe. This is blood bank blood. Transfusion therapy is the safest it has ever been in the practice of medicine. Before blood is taken from a donor, a complete history is taken to make sure that person has no history of diseases nor engages in risky social behavior (examples are intravenous drug use or sexual activity with multiple partners). The donor's travel history is screened to minimize risk of transmitting infections, such as malaria. The donated blood is tested for signs of infectious diseases, such as HIV and hepatitis. The blood is then tested to be sure it is compatible with you in order to minimize the chance of a transfusion reaction. If you or a relative donates blood, this is often done in anticipation of surgery and is not appropriate for emergency situations. It takes many days to process the donated blood. RISKS AND COMPLICATIONS Although transfusion therapy is very safe and saves many lives, the main dangers of transfusion include:  Getting an infectious disease. Developing a transfusion reaction. This is an allergic reaction to something in the blood you were given. Every precaution is taken to prevent this. The decision to have a blood transfusion has been considered carefully by your caregiver before blood is given. Blood is not given unless the benefits outweigh the risks. AFTER THE  TRANSFUSION Right after receiving a blood transfusion, you will usually feel much better and more energetic. This is especially true if your red blood cells have gotten low (anemic). The transfusion raises the level of the red blood cells which carry oxygen, and this usually causes an energy increase. The nurse administering the transfusion will monitor you carefully for complications. HOME CARE INSTRUCTIONS  No special instructions are needed after a transfusion. You may find your energy is better. Speak with your caregiver about any limitations on activity for underlying diseases you may have. SEEK MEDICAL CARE IF:  Your condition is not improving after your transfusion. You develop redness or irritation at the intravenous (IV) site. SEEK IMMEDIATE MEDICAL CARE IF:  Any of the following symptoms occur over the next 12 hours: Shaking chills. You have a temperature by mouth above 102 F (38.9 C), not controlled by medicine. Chest, back, or muscle pain. People around you feel you are not acting correctly or are confused. Shortness of breath or difficulty breathing. Dizziness and fainting. You get a rash or develop hives. You have a decrease in urine output. Your urine turns a dark color or changes to pink, red, or brown. Any of the following symptoms occur over the next 10 days: You have a temperature by mouth above 102 F (38.9 C), not controlled by medicine. Shortness of breath. Weakness after normal activity. The white part of the eye turns yellow (jaundice). You have a decrease in the amount of urine or are urinating less often. Your urine turns a dark color or changes to pink, red, or brown. Document Released: 09/07/2000 Document Revised: 12/03/2011 Document Reviewed: 04/26/2008 [facility] Patient Information 2014 [place], [place].  _______________________________________________________________________

## 2022-03-07 ENCOUNTER — Encounter (HOSPITAL_COMMUNITY): Payer: Self-pay

## 2022-03-07 ENCOUNTER — Encounter (HOSPITAL_COMMUNITY)
Admission: RE | Admit: 2022-03-07 | Discharge: 2022-03-07 | Disposition: A | Discharge status: Home / Self Care | Payer: BC Managed Care – PPO | Source: Ambulatory Visit | Attending: Urology | Admitting: Urology

## 2022-03-07 ENCOUNTER — Other Ambulatory Visit: Payer: Self-pay

## 2022-03-07 DIAGNOSIS — E119 Type 2 diabetes mellitus without complications: Secondary | ICD-10-CM | POA: Insufficient documentation

## 2022-03-07 DIAGNOSIS — I10 Essential (primary) hypertension: Secondary | ICD-10-CM | POA: Diagnosis not present

## 2022-03-07 DIAGNOSIS — Z01812 Encounter for preprocedural laboratory examination: Secondary | ICD-10-CM | POA: Insufficient documentation

## 2022-03-07 DIAGNOSIS — Z01818 Encounter for other preprocedural examination: Secondary | ICD-10-CM

## 2022-03-07 HISTORY — DX: Family history of ischemic heart disease and other diseases of the circulatory system: Z82.49

## 2022-03-07 HISTORY — DX: Nonrheumatic aortic (valve) stenosis: I35.0

## 2022-03-07 HISTORY — DX: Chronic kidney disease, unspecified: N18.9

## 2022-03-07 HISTORY — DX: Unspecified osteoarthritis, unspecified site: M19.90

## 2022-03-07 LAB — CBC
HCT: 49 % (ref 39.0–52.0)
Hemoglobin: 16.1 g/dL (ref 13.0–17.0)
MCH: 30.7 pg (ref 26.0–34.0)
MCHC: 32.9 g/dL (ref 30.0–36.0)
MCV: 93.5 fL (ref 80.0–100.0)
Platelets: 338 10*3/uL (ref 150–400)
RBC: 5.24 MIL/uL (ref 4.22–5.81)
RDW: 12 % (ref 11.5–15.5)
WBC: 5.3 10*3/uL (ref 4.0–10.5)
nRBC: 0 % (ref 0.0–0.2)

## 2022-03-07 LAB — BASIC METABOLIC PANEL
Anion gap: 12 (ref 5–15)
BUN: 14 mg/dL (ref 6–20)
CO2: 23 mmol/L (ref 22–32)
Calcium: 9.4 mg/dL (ref 8.9–10.3)
Chloride: 105 mmol/L (ref 98–111)
Creatinine, Ser: 0.71 mg/dL (ref 0.61–1.24)
GFR, Estimated: >60 mL/min (ref >60)
Glucose, Bld: 179 mg/dL — ABNORMAL HIGH (ref 70–99)
Potassium: 4.7 mmol/L (ref 3.5–5.1)
Sodium: 140 mmol/L (ref 135–145)

## 2022-03-07 LAB — GLUCOSE, CAPILLARY: Glucose-Capillary: 185 mg/dL — ABNORMAL HIGH (ref 70–99)

## 2022-03-07 NOTE — Progress Notes (Addendum)
For Short Stay: [facility] appointment date: Date of COVID positive in last 66 days:  Bowel Prep reminder: N/A   For Anesthesia: PCP - Dr. [name] Cardiologist - Dr. Candee Furbish. LOV: 02/07/22: Clearance  Chest x-ray - CT Chest: 01/01/22 EKG -  Stress Test - 09/30/20 ECHO - 09/29/21 Cardiac Cath -  Pacemaker/ICD device last checked: Pacemaker orders received: Device Rep notified:  Spinal Cord Stimulator:  Sleep Study -  CPAP -   Fasting Blood Sugar - 120 - 170 Checks Blood Sugar ___3__ times a day Date and result of last Hgb A1c-7.0: 01/02/22  Blood Thinner Instructions: Aspirin Instructions: Has been on hold since: 02/07/22 Last Dose:  Activity level: Can go up a flight of stairs and activities of daily living without stopping and without chest pain and/or shortness of breath   Able to exercise without chest pain and/or shortness of breath   Unable to go up a flight of stairs without chest pain and/or shortness of breath     Anesthesia review: Hx: HTN,DIA,Thoracic aneurism.  Patient denies shortness of breath, fever, cough and chest pain at PAT appointment   Patient verbalized understanding of instructions that were given to them at the PAT appointment. Patient was also instructed that they will need to review over the PAT instructions again at home before surgery.

## 2022-03-08 ENCOUNTER — Encounter (HOSPITAL_COMMUNITY): Payer: Self-pay

## 2022-03-11 NOTE — Anesthesia Preprocedure Evaluation (Signed)
Anesthesia Evaluation  Patient identified by MRN, date of birth, ID band Patient awake    Reviewed: Allergy & Precautions, NPO status , Patient's Chart, lab work & pertinent test results  Airway Mallampati: II       Dental   Pulmonary neg pulmonary ROS,    Pulmonary exam normal        Cardiovascular hypertension, Pt. on medications + Valvular Problems/Murmurs  Rhythm:Regular Rate:Normal + Systolic murmurs    Neuro/Psych negative neurological ROS  negative psych ROS   GI/Hepatic   Endo/Other  diabetes, Oral Hypoglycemic Agents  Renal/GU Renal disease     Musculoskeletal  (+) Arthritis ,   Abdominal (+) + obese,   Peds  Hematology   Anesthesia Other Findings LEFT RENAL MASS  Reproductive/Obstetrics                            Anesthesia Physical Anesthesia Plan  ASA: 2  Anesthesia Plan: General   Post-op Pain Management:    Induction: Intravenous  PONV Risk Score and Plan: 2 and Ondansetron, Dexamethasone, Midazolam and Treatment may vary due to age or medical condition  Airway Management Planned: Oral ETT  Additional Equipment:   Intra-op Plan:   Post-operative Plan: Extubation in OR  Informed Consent: I have reviewed the patients History and Physical, chart, labs and discussed the procedure including the risks, benefits and alternatives for the proposed anesthesia with the patient or authorized representative who has indicated his/her understanding and acceptance.     Dental advisory given  Plan Discussed with: CRNA  Anesthesia Plan Comments: (Potential arterial line placement discussed )       Anesthesia Quick Evaluation

## 2022-03-12 ENCOUNTER — Encounter (HOSPITAL_COMMUNITY)
Admission: RE | Disposition: A | Discharge status: Home / Self Care | Payer: Self-pay | Source: Home / Self Care | Attending: Urology

## 2022-03-12 ENCOUNTER — Inpatient Hospital Stay (HOSPITAL_COMMUNITY): Payer: BC Managed Care – PPO | Admitting: Physician Assistant

## 2022-03-12 ENCOUNTER — Inpatient Hospital Stay (HOSPITAL_COMMUNITY)
Admission: RE | Admit: 2022-03-12 | Discharge: 2022-03-15 | DRG: 657 | Disposition: A | Discharge status: Home / Self Care | Payer: BC Managed Care – PPO | Attending: Urology | Admitting: Urology

## 2022-03-12 ENCOUNTER — Inpatient Hospital Stay (HOSPITAL_COMMUNITY): Payer: BC Managed Care – PPO | Admitting: Certified Registered"

## 2022-03-12 ENCOUNTER — Encounter (HOSPITAL_COMMUNITY): Payer: Self-pay | Admitting: Urology

## 2022-03-12 ENCOUNTER — Other Ambulatory Visit: Payer: Self-pay

## 2022-03-12 DIAGNOSIS — I110 Hypertensive heart disease with heart failure: Secondary | ICD-10-CM | POA: Diagnosis present

## 2022-03-12 DIAGNOSIS — E875 Hyperkalemia: Secondary | ICD-10-CM | POA: Diagnosis not present

## 2022-03-12 DIAGNOSIS — I2510 Atherosclerotic heart disease of native coronary artery without angina pectoris: Secondary | ICD-10-CM | POA: Diagnosis not present

## 2022-03-12 DIAGNOSIS — I350 Nonrheumatic aortic (valve) stenosis: Secondary | ICD-10-CM | POA: Diagnosis present

## 2022-03-12 DIAGNOSIS — E785 Hyperlipidemia, unspecified: Secondary | ICD-10-CM | POA: Diagnosis present

## 2022-03-12 DIAGNOSIS — E1165 Type 2 diabetes mellitus with hyperglycemia: Secondary | ICD-10-CM | POA: Diagnosis not present

## 2022-03-12 DIAGNOSIS — Z7984 Long term (current) use of oral hypoglycemic drugs: Secondary | ICD-10-CM | POA: Diagnosis not present

## 2022-03-12 DIAGNOSIS — Z85841 Personal history of malignant neoplasm of brain: Secondary | ICD-10-CM | POA: Diagnosis not present

## 2022-03-12 DIAGNOSIS — I823 Embolism and thrombosis of renal vein: Secondary | ICD-10-CM | POA: Diagnosis not present

## 2022-03-12 DIAGNOSIS — I509 Heart failure, unspecified: Secondary | ICD-10-CM | POA: Diagnosis not present

## 2022-03-12 DIAGNOSIS — Z8249 Family history of ischemic heart disease and other diseases of the circulatory system: Secondary | ICD-10-CM | POA: Diagnosis not present

## 2022-03-12 DIAGNOSIS — Z833 Family history of diabetes mellitus: Secondary | ICD-10-CM | POA: Diagnosis not present

## 2022-03-12 DIAGNOSIS — C642 Malignant neoplasm of left kidney, except renal pelvis: Secondary | ICD-10-CM | POA: Diagnosis not present

## 2022-03-12 DIAGNOSIS — Z6834 Body mass index (BMI) 34.0-34.9, adult: Secondary | ICD-10-CM | POA: Diagnosis not present

## 2022-03-12 DIAGNOSIS — Z91030 Bee allergy status: Secondary | ICD-10-CM

## 2022-03-12 DIAGNOSIS — M1990 Unspecified osteoarthritis, unspecified site: Secondary | ICD-10-CM | POA: Diagnosis present

## 2022-03-12 DIAGNOSIS — D62 Acute posthemorrhagic anemia: Secondary | ICD-10-CM | POA: Diagnosis not present

## 2022-03-12 DIAGNOSIS — E119 Type 2 diabetes mellitus without complications: Secondary | ICD-10-CM | POA: Diagnosis not present

## 2022-03-12 DIAGNOSIS — N2889 Other specified disorders of kidney and ureter: Secondary | ICD-10-CM | POA: Diagnosis not present

## 2022-03-12 DIAGNOSIS — I712 Thoracic aortic aneurysm, without rupture, unspecified: Secondary | ICD-10-CM | POA: Diagnosis not present

## 2022-03-12 DIAGNOSIS — E669 Obesity, unspecified: Secondary | ICD-10-CM | POA: Diagnosis not present

## 2022-03-12 DIAGNOSIS — F10239 Alcohol dependence with withdrawal, unspecified: Secondary | ICD-10-CM | POA: Diagnosis not present

## 2022-03-12 HISTORY — PX: LAPAROSCOPIC NEPHRECTOMY, HAND ASSISTED: SHX1929

## 2022-03-12 LAB — GLUCOSE, CAPILLARY
Glucose-Capillary: 140 mg/dL — ABNORMAL HIGH (ref 70–99)
Glucose-Capillary: 206 mg/dL — ABNORMAL HIGH (ref 70–99)
Glucose-Capillary: 251 mg/dL — ABNORMAL HIGH (ref 70–99)
Glucose-Capillary: 200 mg/dL — ABNORMAL HIGH (ref 70–99)

## 2022-03-12 LAB — TYPE AND SCREEN
ABO/RH(D): A POS
Antibody Screen: NEGATIVE

## 2022-03-12 LAB — HEMOGLOBIN AND HEMATOCRIT, BLOOD
HCT: 51.8 % (ref 39.0–52.0)
Hemoglobin: 16.3 g/dL (ref 13.0–17.0)

## 2022-03-12 LAB — ABO/RH: ABO/RH(D): A POS

## 2022-03-12 LAB — SURGICAL PCR SCREEN
MRSA, PCR: POSITIVE — AB
Staphylococcus aureus: POSITIVE — AB

## 2022-03-12 SURGERY — NEPHRECTOMY, HAND-ASSISTED, LAPAROSCOPIC
Anesthesia: General | Site: Abdomen | Laterality: Left

## 2022-03-12 MED ORDER — PROPOFOL 10 MG/ML IV BOLUS
INTRAVENOUS | Status: DC | PRN
  Administered 2022-03-12: 200 mg via INTRAVENOUS

## 2022-03-12 MED ORDER — FENTANYL CITRATE (PF) 100 MCG/2ML IJ SOLN
INTRAMUSCULAR | Status: AC
Start: 2022-03-12 — End: ?
  Filled 2022-03-12: qty 2

## 2022-03-12 MED ORDER — BUPIVACAINE LIPOSOME 1.3 % IJ SUSP
INTRAMUSCULAR | Status: AC
Start: 2022-03-12 — End: ?
  Filled 2022-03-12: qty 20

## 2022-03-12 MED ORDER — PROPOFOL 10 MG/ML IV BOLUS
INTRAVENOUS | Status: AC
  Filled 2022-03-12: qty 20

## 2022-03-12 MED ORDER — ONDANSETRON HCL 4 MG/2ML IJ SOLN: 4.0000 mg | INTRAMUSCULAR | Status: DC | PRN

## 2022-03-12 MED ORDER — OXYCODONE HCL 5 MG PO TABS
5.0000 mg | ORAL_TABLET | ORAL | Status: DC | PRN
  Administered 2022-03-12 – 2022-03-15 (×6): 5 mg via ORAL
  Filled 2022-03-12 (×6): qty 1

## 2022-03-12 MED ORDER — TRIPLE ANTIBIOTIC 3.5-400-5000 EX OINT: 1.0000 | TOPICAL_OINTMENT | Freq: Three times a day (TID) | CUTANEOUS | Status: DC | PRN

## 2022-03-12 MED ORDER — SODIUM CHLORIDE (PF) 0.9 % IJ SOLN
INTRAMUSCULAR | Status: DC | PRN
  Administered 2022-03-12: 20 mL

## 2022-03-12 MED ORDER — FENTANYL CITRATE (PF) 100 MCG/2ML IJ SOLN
INTRAMUSCULAR | Status: AC
  Filled 2022-03-12: qty 2

## 2022-03-12 MED ORDER — DEXAMETHASONE SODIUM PHOSPHATE 10 MG/ML IJ SOLN
INTRAMUSCULAR | Status: DC | PRN
  Administered 2022-03-12: 4 mg via INTRAVENOUS

## 2022-03-12 MED ORDER — AMISULPRIDE (ANTIEMETIC) 5 MG/2ML IV SOLN: 10.0000 mg | Freq: Once | INTRAVENOUS | Status: DC | PRN

## 2022-03-12 MED ORDER — FENTANYL CITRATE PF 50 MCG/ML IJ SOSY
PREFILLED_SYRINGE | INTRAMUSCULAR | Status: AC
  Administered 2022-03-12: 50 ug via INTRAVENOUS
  Filled 2022-03-12: qty 3

## 2022-03-12 MED ORDER — MIDAZOLAM HCL 2 MG/2ML IJ SOLN
INTRAMUSCULAR | Status: AC
  Filled 2022-03-12: qty 2

## 2022-03-12 MED ORDER — ATORVASTATIN CALCIUM 40 MG PO TABS
80.0000 mg | ORAL_TABLET | Freq: Every day | ORAL | Status: DC
Start: 2022-03-12 — End: 2022-03-15
  Administered 2022-03-12 – 2022-03-15 (×4): 80 mg via ORAL
  Filled 2022-03-12: qty 1
  Filled 2022-03-12: qty 2
  Filled 2022-03-12 (×3): qty 1

## 2022-03-12 MED ORDER — ACETAMINOPHEN 500 MG PO TABS
1000.0000 mg | ORAL_TABLET | Freq: Once | ORAL | Status: AC
  Administered 2022-03-12: 1000 mg via ORAL
  Filled 2022-03-12: qty 2

## 2022-03-12 MED ORDER — FENTANYL CITRATE PF 50 MCG/ML IJ SOSY
25.0000 ug | PREFILLED_SYRINGE | INTRAMUSCULAR | Status: DC | PRN
  Administered 2022-03-12 (×2): 50 ug via INTRAVENOUS

## 2022-03-12 MED ORDER — ROCURONIUM BROMIDE 10 MG/ML (PF) SYRINGE
PREFILLED_SYRINGE | INTRAVENOUS | Status: AC
  Filled 2022-03-12: qty 10

## 2022-03-12 MED ORDER — HYOSCYAMINE SULFATE 0.125 MG SL SUBL
0.1250 mg | SUBLINGUAL_TABLET | SUBLINGUAL | Status: DC | PRN
  Administered 2022-03-12: 0.125 mg via SUBLINGUAL
  Filled 2022-03-12 (×2): qty 1

## 2022-03-12 MED ORDER — LIDOCAINE 2% (20 MG/ML) 5 ML SYRINGE
INTRAMUSCULAR | Status: DC | PRN
  Administered 2022-03-12: 40 mg via INTRAVENOUS

## 2022-03-12 MED ORDER — ONDANSETRON HCL 4 MG/2ML IJ SOLN
INTRAMUSCULAR | Status: AC
  Filled 2022-03-12: qty 2

## 2022-03-12 MED ORDER — ONDANSETRON HCL 4 MG/2ML IJ SOLN
INTRAMUSCULAR | Status: DC | PRN
  Administered 2022-03-12: 4 mg via INTRAVENOUS

## 2022-03-12 MED ORDER — BOOST / RESOURCE BREEZE PO LIQD CUSTOM
1.0000 | Freq: Three times a day (TID) | ORAL | Status: DC
  Administered 2022-03-12: 1 via ORAL

## 2022-03-12 MED ORDER — LACTATED RINGERS IR SOLN
Status: DC | PRN
  Administered 2022-03-12: 1000 mL

## 2022-03-12 MED ORDER — ORAL CARE MOUTH RINSE: 15.0000 mL | Freq: Once | OROMUCOSAL | Status: AC

## 2022-03-12 MED ORDER — OXYCODONE HCL 5 MG PO TABS: 5.0000 mg | ORAL_TABLET | Freq: Once | ORAL | Status: DC | PRN

## 2022-03-12 MED ORDER — LACTATED RINGERS IV SOLN: INTRAVENOUS | Status: DC

## 2022-03-12 MED ORDER — CHLORHEXIDINE GLUCONATE 0.12 % MT SOLN
15.0000 mL | Freq: Once | OROMUCOSAL | Status: AC
  Administered 2022-03-12: 15 mL via OROMUCOSAL

## 2022-03-12 MED ORDER — DEXAMETHASONE SODIUM PHOSPHATE 10 MG/ML IJ SOLN
INTRAMUSCULAR | Status: AC
  Filled 2022-03-12: qty 1

## 2022-03-12 MED ORDER — HEMOSTATIC AGENTS (NO CHARGE) OPTIME
TOPICAL | Status: DC | PRN
  Administered 2022-03-12: 1 via TOPICAL

## 2022-03-12 MED ORDER — PHENYLEPHRINE HCL-NACL 20-0.9 MG/250ML-% IV SOLN
INTRAVENOUS | Status: DC | PRN
  Administered 2022-03-12: 20 ug/min via INTRAVENOUS

## 2022-03-12 MED ORDER — SODIUM CHLORIDE (PF) 0.9 % IJ SOLN
INTRAMUSCULAR | Status: AC
  Filled 2022-03-12: qty 20

## 2022-03-12 MED ORDER — FENTANYL CITRATE (PF) 250 MCG/5ML IJ SOLN
INTRAMUSCULAR | Status: DC | PRN
  Administered 2022-03-12: 50 ug via INTRAVENOUS
  Administered 2022-03-12: 100 ug via INTRAVENOUS
  Administered 2022-03-12 (×5): 50 ug via INTRAVENOUS

## 2022-03-12 MED ORDER — 0.9 % SODIUM CHLORIDE (POUR BTL) OPTIME
TOPICAL | Status: DC | PRN
  Administered 2022-03-12: 1000 mL

## 2022-03-12 MED ORDER — MIDAZOLAM HCL 2 MG/2ML IJ SOLN
INTRAMUSCULAR | Status: DC | PRN
  Administered 2022-03-12: 2 mg via INTRAVENOUS

## 2022-03-12 MED ORDER — PHENYLEPHRINE 80 MCG/ML (10ML) SYRINGE FOR IV PUSH (FOR BLOOD PRESSURE SUPPORT)
PREFILLED_SYRINGE | INTRAVENOUS | Status: DC | PRN
  Administered 2022-03-12: 160 ug via INTRAVENOUS

## 2022-03-12 MED ORDER — ROCURONIUM BROMIDE 10 MG/ML (PF) SYRINGE
PREFILLED_SYRINGE | INTRAVENOUS | Status: DC | PRN
  Administered 2022-03-12: 20 mg via INTRAVENOUS
  Administered 2022-03-12: 60 mg via INTRAVENOUS
  Administered 2022-03-12: 10 mg via INTRAVENOUS
  Administered 2022-03-12: 40 mg via INTRAVENOUS

## 2022-03-12 MED ORDER — UMECLIDINIUM-VILANTEROL 62.5-25 MCG/ACT IN AEPB
1.0000 | INHALATION_SPRAY | Freq: Every day | RESPIRATORY_TRACT | Status: DC
  Administered 2022-03-14 – 2022-03-15 (×2): 1 via RESPIRATORY_TRACT
  Filled 2022-03-12: qty 14

## 2022-03-12 MED ORDER — DIPHENHYDRAMINE HCL 50 MG/ML IJ SOLN: 12.5000 mg | Freq: Four times a day (QID) | INTRAMUSCULAR | Status: DC | PRN

## 2022-03-12 MED ORDER — DOCUSATE SODIUM 100 MG PO CAPS: 100.0000 mg | ORAL_CAPSULE | Freq: Two times a day (BID) | ORAL | Status: DC

## 2022-03-12 MED ORDER — SODIUM CHLORIDE 0.9 % IV SOLN: INTRAVENOUS | Status: DC

## 2022-03-12 MED ORDER — SUGAMMADEX SODIUM 200 MG/2ML IV SOLN
INTRAVENOUS | Status: DC | PRN
  Administered 2022-03-12: 220 mg via INTRAVENOUS

## 2022-03-12 MED ORDER — OXYCODONE HCL 5 MG PO TABS: 5.0000 mg | ORAL_TABLET | Freq: Four times a day (QID) | ORAL | 0 refills | Status: DC | PRN

## 2022-03-12 MED ORDER — HYDROMORPHONE HCL 1 MG/ML IJ SOLN
INTRAMUSCULAR | Status: AC
  Administered 2022-03-12: 1 mg via INTRAVENOUS
  Filled 2022-03-12: qty 1

## 2022-03-12 MED ORDER — DOCUSATE SODIUM 100 MG PO CAPS
100.0000 mg | ORAL_CAPSULE | Freq: Two times a day (BID) | ORAL | Status: DC
  Administered 2022-03-12 – 2022-03-15 (×6): 100 mg via ORAL
  Filled 2022-03-12 (×6): qty 1

## 2022-03-12 MED ORDER — AMLODIPINE BESYLATE 10 MG PO TABS
10.0000 mg | ORAL_TABLET | Freq: Every day | ORAL | Status: DC
  Administered 2022-03-13 – 2022-03-15 (×3): 10 mg via ORAL
  Filled 2022-03-12 (×4): qty 1

## 2022-03-12 MED ORDER — CEFAZOLIN SODIUM-DEXTROSE 2-4 GM/100ML-% IV SOLN
2.0000 g | INTRAVENOUS | Status: AC
  Administered 2022-03-12: 2 g via INTRAVENOUS
  Filled 2022-03-12: qty 100

## 2022-03-12 MED ORDER — OXYCODONE HCL 5 MG/5ML PO SOLN: 5.0000 mg | Freq: Once | ORAL | Status: DC | PRN

## 2022-03-12 MED ORDER — ALBUTEROL SULFATE (2.5 MG/3ML) 0.083% IN NEBU: 2.5000 mg | INHALATION_SOLUTION | RESPIRATORY_TRACT | Status: DC | PRN

## 2022-03-12 MED ORDER — ACETAMINOPHEN 500 MG PO TABS
1000.0000 mg | ORAL_TABLET | Freq: Four times a day (QID) | ORAL | Status: AC
  Administered 2022-03-12 – 2022-03-13 (×3): 1000 mg via ORAL
  Filled 2022-03-12 (×4): qty 2

## 2022-03-12 MED ORDER — TAMSULOSIN HCL 0.4 MG PO CAPS
0.4000 mg | ORAL_CAPSULE | Freq: Every day | ORAL | Status: DC
  Administered 2022-03-12 – 2022-03-15 (×4): 0.4 mg via ORAL
  Filled 2022-03-12 (×5): qty 1

## 2022-03-12 MED ORDER — INSULIN ASPART 100 UNIT/ML IJ SOLN
0.0000 [IU] | Freq: Three times a day (TID) | INTRAMUSCULAR | Status: DC
  Administered 2022-03-12: 8 [IU] via SUBCUTANEOUS
  Administered 2022-03-13: 3 [IU] via SUBCUTANEOUS
  Administered 2022-03-13: 5 [IU] via SUBCUTANEOUS
  Administered 2022-03-13: 3 [IU] via SUBCUTANEOUS
  Administered 2022-03-14 (×2): 5 [IU] via SUBCUTANEOUS
  Administered 2022-03-14 – 2022-03-15 (×2): 3 [IU] via SUBCUTANEOUS

## 2022-03-12 MED ORDER — EZETIMIBE 10 MG PO TABS
10.0000 mg | ORAL_TABLET | Freq: Every day | ORAL | Status: DC
Start: 2022-03-12 — End: 2022-03-15
  Administered 2022-03-12 – 2022-03-15 (×4): 10 mg via ORAL
  Filled 2022-03-12 (×5): qty 1

## 2022-03-12 MED ORDER — BUPIVACAINE LIPOSOME 1.3 % IJ SUSP
INTRAMUSCULAR | Status: DC | PRN
  Administered 2022-03-12: 20 mL

## 2022-03-12 MED ORDER — HYDROMORPHONE HCL 1 MG/ML IJ SOLN
0.5000 mg | INTRAMUSCULAR | Status: DC | PRN
  Administered 2022-03-12 – 2022-03-13 (×4): 1 mg via INTRAVENOUS
  Filled 2022-03-12 (×4): qty 1

## 2022-03-12 MED ORDER — MUPIROCIN 2 % EX OINT
1.0000 | TOPICAL_OINTMENT | Freq: Two times a day (BID) | CUTANEOUS | Status: DC
  Administered 2022-03-12 – 2022-03-15 (×6): 1 via NASAL
  Filled 2022-03-12: qty 22

## 2022-03-12 MED ORDER — DIPHENHYDRAMINE HCL 12.5 MG/5ML PO ELIX: 12.5000 mg | ORAL_SOLUTION | Freq: Four times a day (QID) | ORAL | Status: DC | PRN

## 2022-03-12 SURGICAL SUPPLY — 68 items
ADH SKN CLS APL DERMABOND .7 (GAUZE/BANDAGES/DRESSINGS) ×1
AGENT HMST KT MTR STRL THRMB (HEMOSTASIS)
APL PRP STRL LF DISP 70% ISPRP (MISCELLANEOUS) ×1
BAG COUNTER SPONGE SURGICOUNT (BAG) IMPLANT
BAG LAPAROSCOPIC 12 15 PORT 16 (BASKET) IMPLANT
BAG RETRIEVAL 12/15 (BASKET) ×2
BAG SPEC THK2 15X12 ZIP CLS (MISCELLANEOUS) ×1
BAG SPNG CNTER NS LX DISP (BAG)
BAG ZIPLOCK 12X15 (MISCELLANEOUS) ×2 IMPLANT
BLADE EXTENDED COATED 6.5IN (ELECTRODE) IMPLANT
BLADE SURG SZ10 CARB STEEL (BLADE) IMPLANT
CABLE HIGH FREQUENCY MONO STRZ (ELECTRODE) ×2 IMPLANT
CHLORAPREP W/TINT 26 (MISCELLANEOUS) ×2 IMPLANT
CLEANER TIP ELECTROSURG 2X2 (MISCELLANEOUS) IMPLANT
CLIP LIGATING HEM O LOK PURPLE (MISCELLANEOUS) ×1 IMPLANT
CLIP LIGATING HEMO LOK XL GOLD (MISCELLANEOUS) ×1 IMPLANT
CLIP LIGATING HEMO O LOK GREEN (MISCELLANEOUS) ×1 IMPLANT
COVER SURGICAL LIGHT HANDLE (MISCELLANEOUS) ×2 IMPLANT
CUTTER FLEX LINEAR 45M (STAPLE) ×1 IMPLANT
DERMABOND ADVANCED (GAUZE/BANDAGES/DRESSINGS) ×1
DERMABOND ADVANCED .7 DNX12 (GAUZE/BANDAGES/DRESSINGS) IMPLANT
DRAIN CHANNEL 10F 3/8 F FF (DRAIN) IMPLANT
DRAPE INCISE IOBAN 66X45 STRL (DRAPES) ×2 IMPLANT
DRSG IV TEGADERM 3.5X4.5 STRL (GAUZE/BANDAGES/DRESSINGS) ×1 IMPLANT
DRSG TEGADERM 4X4.75 (GAUZE/BANDAGES/DRESSINGS) ×1 IMPLANT
DRSG TELFA 3X8 NADH (GAUZE/BANDAGES/DRESSINGS) ×2 IMPLANT
ELECT PENCIL ROCKER SW 15FT (MISCELLANEOUS) ×2 IMPLANT
ELECT REM PT RETURN 15FT ADLT (MISCELLANEOUS) ×2 IMPLANT
EVACUATOR SILICONE 100CC (DRAIN) IMPLANT
GAUZE SPONGE 4X4 12PLY STRL (GAUZE/BANDAGES/DRESSINGS) ×1 IMPLANT
GLOVE BIO SURGEON STRL SZ 6.5 (GLOVE) ×2 IMPLANT
GLOVE BIO SURGEON STRL SZ7.5 (GLOVE) ×2 IMPLANT
GOWN STRL REUS W/ TWL XL LVL3 (GOWN DISPOSABLE) ×1 IMPLANT
GOWN STRL REUS W/TWL XL LVL3 (GOWN DISPOSABLE) ×2
HANDLE STAPLE EGIA 4 XL (STAPLE) IMPLANT
HEMOSTAT SURGICEL 4X8 (HEMOSTASIS) IMPLANT
IRRIG SUCT STRYKERFLOW 2 WTIP (MISCELLANEOUS) ×2
IRRIGATION SUCT STRKRFLW 2 WTP (MISCELLANEOUS) IMPLANT
KIT BASIN OR (CUSTOM PROCEDURE TRAY) ×2 IMPLANT
KIT TURNOVER KIT A (KITS) ×1 IMPLANT
LIGASURE VESSEL 5MM BLUNT TIP (ELECTROSURGICAL) ×1 IMPLANT
PAD DRESSING TELFA 3X8 NADH (GAUZE/BANDAGES/DRESSINGS) IMPLANT
RELOAD 45 VASCULAR/THIN (ENDOMECHANICALS) ×4 IMPLANT
RELOAD EGIA 45 TAN VASC (STAPLE) IMPLANT
RELOAD STAPLE 45 2.5 WHT GRN (ENDOMECHANICALS) IMPLANT
SCISSORS LAP 5X35 DISP (ENDOMECHANICALS) ×2 IMPLANT
SET TUBE SMOKE EVAC HIGH FLOW (TUBING) ×2 IMPLANT
SOL ANTI FOG 6CC (MISCELLANEOUS) ×1 IMPLANT
SOLUTION ANTI FOG 6CC (MISCELLANEOUS) ×1
SPONGE T-LAP 18X18 ~~LOC~~+RFID (SPONGE) ×1 IMPLANT
STAPLER VISISTAT 35W (STAPLE) ×1 IMPLANT
SURGIFLO W/THROMBIN 8M KIT (HEMOSTASIS) IMPLANT
SUT ETHILON 3 0 PS 1 (SUTURE) IMPLANT
SUT MNCRL AB 4-0 PS2 18 (SUTURE) ×2 IMPLANT
SUT PDS AB 1 TP1 96 (SUTURE) ×4 IMPLANT
SUT VIC AB 2-0 SH 27 (SUTURE)
SUT VIC AB 2-0 SH 27X BRD (SUTURE) IMPLANT
SUT VICRYL 0 UR6 27IN ABS (SUTURE) IMPLANT
SYS BAG RETRIEVAL 10MM (BASKET)
SYS LAPSCP GELPORT 120MM (MISCELLANEOUS) ×2
SYSTEM BAG RETRIEVAL 10MM (BASKET) IMPLANT
SYSTEM LAPSCP GELPORT 120MM (MISCELLANEOUS) ×1 IMPLANT
TOWEL OR 17X26 10 PK STRL BLUE (TOWEL DISPOSABLE) ×2 IMPLANT
TRAY FOLEY MTR SLVR 16FR STAT (SET/KITS/TRAYS/PACK) ×2 IMPLANT
TRAY LAPAROSCOPIC (CUSTOM PROCEDURE TRAY) ×2 IMPLANT
TROCAR ADV FIXATION 12X100MM (TROCAR) ×2 IMPLANT
TROCAR UNIVERSAL OPT 12M 100M (ENDOMECHANICALS) ×2 IMPLANT
TROCAR Z-THREAD OPTICAL 5X100M (TROCAR) IMPLANT

## 2022-03-12 NOTE — Anesthesia Postprocedure Evaluation (Signed)
Anesthesia Post Note  Patient: Radio producer III  Procedure(s) Performed: LEFT HAND ASSISTED LAPAROSCOPIC NEPHRECTOMY (Left: Abdomen)     Patient location during evaluation: PACU Anesthesia Type: General Level of consciousness: awake Pain management: pain level controlled Vital Signs Assessment: post-procedure vital signs reviewed and stable Respiratory status: spontaneous breathing, nonlabored ventilation, respiratory function stable and patient connected to nasal cannula oxygen Cardiovascular status: blood pressure returned to baseline and stable Postop Assessment: no apparent nausea or vomiting Anesthetic complications: no   No notable events documented.  Last Vitals:  Vitals:   03/12/22 1454 03/12/22 1835  BP: 106/77 101/66  Pulse: 95 (!) 105  Resp: 20 14  Temp: 36.9 C 36.5 C  SpO2: 98% 97%    Last Pain:  Vitals:   03/12/22 1835  TempSrc: Oral  PainSc:                  Karyl Kinnier Miu Chiong

## 2022-03-12 NOTE — Op Note (Signed)
Operative Note  Preoperative diagnosis:  1.  Left renal mass  Postoperative diagnosis: 1.  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  Left hand assisted laparoscopic radical nephrectomy  Surgeon: Link Snuffer, MD  Assistants: Debbrah Alar an assistant was needed throughout the case further expertise in assisting with a laparoscopic surgery, including visualization with the camera, passing instruments, etc.  Anesthesia: General  Complications: None  EBL: 100 cc  Specimens: 1.  Left kidney  Drains/Catheters: 1. Foley catheter  Intraoperative findings: Left kidney removed entirely.  After kidney was removed, the vein was evaluated.  Freely mobile thrombus within the vein did not appear to encroach the staple line.  Indication: 56 year old male who was found on imaging to have a left renal mass concerning for renal cell carcinoma.  After discussion of different options, the patient elected to undergo the above operation.  Description of procedure:  The patient was identified and consent was obtained.  The patient was taken to the operating room and placed in the supine position.  The patient was placed under general anesthesia.  Perioperative antibiotics were administered.  The patient was placed in left lateral position at approximately 65 degrees and all pressure points were padded.  Patient was prepped and draped in a standard sterile fashion and a timeout was performed.  An 8 cm periumbilical incision was made sharply into the skin.  This was carried down with Bovie electrocautery down to the anterior rectus sheath which was divided with electrocautery.  The underlying musculature was separated in the midline.  Sharp dissection with Metzenbaum scissors was used to open up the posterior sheath and peritoneum.  This was extended with electrocautery taking great care not to use cautery near the bowel.  The hand assist port was secured into the incision.  I made sure no  bowel was trapped within this.  A 12 mm port was inserted through the hand assist port and the abdomen was insufflated to a pressure of 15.  A 12 mm port was placed lateral as well as superior to the hand assist port, each 1 about a hand width away.Please note that all ports were placed under direct visualization with the camera.  The colon was first dissected medially by incising along the white line of [place].  After medializing the colon, the kidney was dissected laterally and medially as well as superiorly.  Inferior attachments as well as the gonadal vein were divided with LigaSure device.  The ureter was identified and incised.  The stent was removed.  The ureter was clipped with a clip.I continued to carefully dissect medially and identified the renal hilum.  I carefully inspected the renal vein.  I did not palpate an obvious thrombus within the proximal portion of the renal vein.  The renal vein and renal artery were divided with a 45 mm vascular staple load.  Superior attachments were then released using LigaSure device as well as blunt dissection.  Once the entire kidney and surrounding Gerota's fascia was freed, the specimen was withdrawn from the midline incision and passed off for permanent specimen.  The abdomen was reinspected and no active bleeding was noted with the pressure down to 5.  Arista was applied to the nephrectomy bed.  I carefully inspected the specimen.  I made a small incision over the renal vein.  There was a more proximal renal vein thrombus that was freely mobile.  Did not appear to involve the staple line.  The midline fascia was closed  with running 0 looped PDS suture followed by staples.  The port incisions were closed with a 2-0 Vicryl followed by staples.  Exparel was instilled for anesthetic effect.  A dressing was applied.  Patient tolerated the procedure well and was stable postoperatively.  Plan: Stat labs will be obtained.  Anticipate the patient will be in the hospital  1-2 nights as long as he does well.

## 2022-03-12 NOTE — Anesthesia Procedure Notes (Signed)
Procedure Name: Intubation Date/Time: 03/12/2022 7:35 AM  Performed by: Eben Burow, CRNAPre-anesthesia Checklist: Patient identified, Emergency Drugs available, Suction available, Patient being monitored and Timeout performed Patient Re-evaluated:Patient Re-evaluated prior to induction Oxygen Delivery Method: Circle system utilized Preoxygenation: Pre-oxygenation with 100% oxygen Induction Type: IV induction Ventilation: Mask ventilation without difficulty Laryngoscope Size: Mac and 4 Grade View: Grade I Tube type: Oral Tube size: 7.5 mm Number of attempts: 1 Airway Equipment and Method: Stylet Placement Confirmation: ETT inserted through vocal cords under direct vision, positive ETCO2 and breath sounds checked- equal and bilateral Secured at: 23 cm Tube secured with: Tape Dental Injury: Teeth and Oropharynx as per pre-operative assessment

## 2022-03-12 NOTE — H&P (Signed)
CC/HPI: CC: Left renal mass concerning for renal cell carcinoma  HPI:  01/11/2022  56 year old male with a history of aortic root dilation, mild to moderate aortic valve stenosis, coronary artery disease who was recently admitted to the hospital for acute respiratory failure with hypoxia. He was also found to have a thoracic aortic aneurysm. Cardiothoracic surgery recommended that he follow-up in 6 months in regards to that. He also underwent a CT of the abdomen with and without contrast that revealed a lobular enhancing mass in the lower pole of the left kidney measuring 4.4 cm with extension into the renal hilum appearing to involve the collecting system. Left renal vein appeared normal. Patient denies any hematuria. Renal function was normal on 4/13. He had a creatinine of 0.76.   03/12/2022 Patient had to delay surgery due to his wife being found to have a brain tumor and having urgent surgery.  Found to have glioblastoma.  He presents today for left hand-assisted laparoscopic radical nephrectomy, possible conversion to open surgery.    ALLERGIES: Bee stings    MEDICATIONS: Aspirin 81 mg tablet,chewable  Hydrocodone-Acetaminophen 5 mg-325 mg tablet 1 tablet PO Q 4 H  Metformin Hcl  Metformin Hcl 1,000 mg tablet  Simvastatin  Albuterol Sulfate  Amlodipine Besylate 10 mg tablet  Atorvastatin Calcium  B Complex  Diazepam 10 mg tablet 2 tablet PO As Directed Take both pills approximately 45 minutes prior to scheduled procedure on an empty stomach.  Ezetimibe  Glimepiride  Jardiance  Losartan Potassium  Mounjaro     GU PSH: Vasectomy - 2019     NON-GU PSH: Tonsillectomy - 1974         GU PMH: Encounter for sterilization, Bilateral - 2019    NON-GU PMH: Encounter for other contraceptive management, We discussed the procedure today, going over the incision used and the fact that I use the no-scalpel, no-needle approach. We went over the pertinent anatomy of the scrotum and vasa. I  have discussed with the patient the single midline incision that I use and the fact that the procedure is office-based and takes about 10 to 15 minutes. We discussed the fact that the patient must absolutely be sure that he does not want to father a child under any circumstances and was counseled to talk with his partner regarding this decision. We then discussed the potential risks and complications of this procedure, including but not limited to epididymitis with typically transient discomfort and swelling after the procedure, which can occur on one or both sides; sperm granuloma, which was described in detail as being caused by the presence of some sperm leaking out from the severed ends of the vasa, causing an inflammatory reaction and oftentimes resulting in a small round area of scar that may persist; hematoma and bleeding into the scrotum, which can be significant and possibly require a second incision to drain the accumulated blood, as well as how to prevent this by decreased activity level. We discussed superficial skin infection, which is rare, and abscess formation, which is exceedingly rare and may require drainage as well. Finally, we went over recanalization, which can occur in 1 in 2000 cases and how to determine this and prevent this from occurring. The patient was given written information regarding all of these risks and complications. Also, informed of preoperative preparation of shaving the scrotum, and given written information indicating that immediately after the procedure continued birth control needs to be used until 2 semen specimens are noted to be free of  sperm. - 2018 Cardiac murmur, unspecified Diabetes Type 2 Hypertension    FAMILY HISTORY: 1 Daughter - Daughter 1 son - Son Aneurysm - Runs in Family Death In The Family Father - Father Diabetes - Father Heart Disease - Father heart failure - Father Kidney Stones - Sister   SOCIAL HISTORY: Marital Status: Married Preferred  Language: English; Ethnicity: Not Hispanic Or Latino; Race: White Current Smoking Status: Patient has never smoked.  <DIV'  Tobacco Use Assessment Completed:  Used Tobacco in last 30 days?   Drinks 4 drinks per week.  Drinks 4+ caffeinated drinks per day.    REVIEW OF SYSTEMS:     GU Review Male:  Patient reports frequent urination. Patient denies hard to postpone urination, burning/ pain with urination, get up at night to urinate, leakage of urine, stream starts and stops, trouble starting your stream, have to strain to urinate , erection problems, and penile pain.    Gastrointestinal (Upper):  Patient denies nausea, vomiting, and indigestion/ heartburn.    Gastrointestinal (Lower):  Patient denies diarrhea and constipation.    Constitutional:  Patient reports fatigue. Patient denies fever, night sweats, and weight loss.    Skin:  Patient denies skin rash/ lesion and itching.    Eyes:  Patient denies blurred vision and double vision.    Ears/ Nose/ Throat:  Patient reports sinus problems. Patient denies sore throat.    Hematologic/Lymphatic:  Patient denies swollen glands and easy bruising.    Cardiovascular:  Patient denies leg swelling and chest pains.    Respiratory:  Patient reports cough. Patient denies shortness of breath.    Endocrine:  Patient denies excessive thirst.    Musculoskeletal:  Patient denies back pain and joint pain.    Neurological:  Patient denies headaches and dizziness.    Psychologic:  Patient denies depression and anxiety.    BP (!) 155/91   Pulse 77   Temp 99.1 F (37.3 C) (Oral)   Resp 18   Ht '5\' 10"'$  (1.778 m)   Wt 109 kg   SpO2 95%   BMI 34.48 kg/m                                                  MULTI-SYSTEM PHYSICAL EXAMINATION:      Constitutional: Well-nourished. No physical deformities. Normally developed. Good grooming.     Respiratory: No labored breathing, no use of accessory muscles.      Cardiovascular: Normal temperature, normal  extremity pulses, no swelling, no varicosities.     Skin: No paleness, no jaundice, no cyanosis. No lesion, no ulcer, no rash.     Neurologic / Psychiatric: Oriented to time, oriented to place, oriented to person. No depression, no anxiety, no agitation.     Gastrointestinal: No mass, no tenderness, no rigidity, non obese abdomen.     Eyes: Normal conjunctivae. Normal eyelids.     Musculoskeletal: Normal gait and station of head and neck.            Complexity of Data:   Source Of History:  Patient  Lab Test Review:  BMP  Records Review:  Previous Doctor Records, Previous Patient Records  Urine Test Review:  Urinalysis  X-Ray Review: C.T. Chest/ Abd/Pelvis: Reviewed Films. Reviewed Report. Discussed With Patient.       ASSESSMENT:     ICD-10 Details  1  GU:  Left renal neoplasm - D49.512 Undiagnosed New Problem   PLAN:     Notes:  I spoke to the patient regarding the renal mass. I explained the natural history of renal cancer and the way in which the stage, pathological features, patient health, age, life expectancy, and quality of life all affect the decision-making process, prognosis, and need for additional studies. I also explained how all of these things affect the recommended treatment strategies. I presented all of the standard management options for renal cancer and their associated cancer control outcomes, impact on quality of life, likelihood of achieving goals, risks, complications, and benefits.   We discussed surgery and its different forms including open surgery, laparoscopic surgery, and robotic surgery. I described the risks which included, but are not limited to, bleeding, infection, allergic reaction, heart attack, stroke, pulmonary embolus, death, positioning injury, damage to contiguous structures, need for blood transfusion (and its risk of infection, transfusion reaction, anaphylactic reaction, and death), nerve injury, vascular injury, urinary leak, fistula, renal  failure, and hernia. We also discussed that any laparoscopic or robotic surgery might need to be converted to an open surgery should difficulties be encountered. We discussed in detail the expectations of surgery with regard to cancer control, as well as expected post-operative recovery process. We also discussed long term outcomes and risks.    We discussed other management options and their risks, benefits. These include biopsy of the mass, cryotherapy, and RFA. We discussed that biopsy can be associated with false negatives, false positives, and risk of spread of the cancer as well as damage to the kidney and bleeding. I generally do not recommend biopsy of the mass unless we are concerned that the mass is a metastasis from another source or unless the patient is not a good surgical candidate or if there is a concern for lymphoma or abscess. We discussed that thermal ablation is generally reserved for patients who are not good surgical candidates or wish to avoid surgery. We discuss that thermal ablation is inferior to surgery for cancer control. Based on the masses location, thermal ablation or cryotherapy are not options.   The patient was encouraged to ask questions throughout the discussion today and all questions were answered to the patients stated satisfaction. The patient was able to ask pertinent questions showing an understanding of the situation.   He has received cardiology/pulmonology clearance.  He obtained an MRI that revealed very small thrombus into the renal vein.  Does not extend far into the renal vein.  Should be manageable laparoscopically.  However, I did discuss the possible conversion to open.  He had to delay surgery and imaging was 1 month ago.

## 2022-03-12 NOTE — Discharge Instructions (Signed)

## 2022-03-12 NOTE — Transfer of Care (Signed)
Immediate Anesthesia Transfer of Care Note  Patient: Russell Burns  Procedure(s) Performed: LEFT HAND ASSISTED LAPAROSCOPIC NEPHRECTOMY (Left: Abdomen)  Patient Location: PACU  Anesthesia Type:General  Level of Consciousness: awake, alert  and patient cooperative  Airway & Oxygen Therapy: Patient Spontanous Breathing and Patient connected to face mask oxygen  Post-op Assessment: Report given to RN and Post -op Vital signs reviewed and stable  Post vital signs: Reviewed and stable  Last Vitals:  Vitals Value Taken Time  BP 173/88 03/12/22 1019  Temp    Pulse 74 03/12/22 1022  Resp 12 03/12/22 1022  SpO2 100 % 03/12/22 1022  Vitals shown include unvalidated device data.  Last Pain:  Vitals:   03/12/22 0609  TempSrc: Oral  PainSc: 0-No pain         Complications: No notable events documented.

## 2022-03-13 ENCOUNTER — Other Ambulatory Visit: Payer: Self-pay

## 2022-03-13 ENCOUNTER — Encounter (HOSPITAL_COMMUNITY): Payer: Self-pay | Admitting: Urology

## 2022-03-13 LAB — POTASSIUM
Potassium: 6.1 mmol/L — ABNORMAL HIGH (ref 3.5–5.1)
Potassium: 5.7 mmol/L — ABNORMAL HIGH (ref 3.5–5.1)
Potassium: 5 mmol/L (ref 3.5–5.1)

## 2022-03-13 LAB — BASIC METABOLIC PANEL
Anion gap: 10 (ref 5–15)
BUN: 24 mg/dL — ABNORMAL HIGH (ref 6–20)
CO2: 23 mmol/L (ref 22–32)
Calcium: 8.1 mg/dL — ABNORMAL LOW (ref 8.9–10.3)
Chloride: 103 mmol/L (ref 98–111)
Creatinine, Ser: 1.82 mg/dL — ABNORMAL HIGH (ref 0.61–1.24)
GFR, Estimated: 43 mL/min — ABNORMAL LOW (ref >60)
Glucose, Bld: 215 mg/dL — ABNORMAL HIGH (ref 70–99)
Potassium: 6.4 mmol/L (ref 3.5–5.1)
Sodium: 136 mmol/L (ref 135–145)

## 2022-03-13 LAB — GLUCOSE, CAPILLARY
Glucose-Capillary: 204 mg/dL — ABNORMAL HIGH (ref 70–99)
Glucose-Capillary: 197 mg/dL — ABNORMAL HIGH (ref 70–99)
Glucose-Capillary: 173 mg/dL — ABNORMAL HIGH (ref 70–99)
Glucose-Capillary: 213 mg/dL — ABNORMAL HIGH (ref 70–99)
Glucose-Capillary: 170 mg/dL — ABNORMAL HIGH (ref 70–99)

## 2022-03-13 LAB — HEMOGLOBIN AND HEMATOCRIT, BLOOD
HCT: 30.4 % — ABNORMAL LOW (ref 39.0–52.0)
HCT: 26.1 % — ABNORMAL LOW (ref 39.0–52.0)
Hemoglobin: 9.6 g/dL — ABNORMAL LOW (ref 13.0–17.0)
Hemoglobin: 8.4 g/dL — ABNORMAL LOW (ref 13.0–17.0)

## 2022-03-13 MED ORDER — LORAZEPAM 1 MG PO TABS: 1.0000 mg | ORAL_TABLET | ORAL | Status: DC | PRN

## 2022-03-13 MED ORDER — FOLIC ACID 1 MG PO TABS
1.0000 mg | ORAL_TABLET | Freq: Every day | ORAL | Status: DC
  Administered 2022-03-13 – 2022-03-15 (×3): 1 mg via ORAL
  Filled 2022-03-13 (×3): qty 1

## 2022-03-13 MED ORDER — LORAZEPAM 2 MG/ML IJ SOLN: 1.0000 mg | INTRAMUSCULAR | Status: DC | PRN

## 2022-03-13 MED ORDER — THIAMINE HCL 100 MG PO TABS
100.0000 mg | ORAL_TABLET | Freq: Every day | ORAL | Status: DC
  Administered 2022-03-13 – 2022-03-15 (×3): 100 mg via ORAL
  Filled 2022-03-13 (×3): qty 1

## 2022-03-13 MED ORDER — THIAMINE HCL 100 MG/ML IJ SOLN: 100.0000 mg | Freq: Every day | INTRAMUSCULAR | Status: DC

## 2022-03-13 MED ORDER — ADULT MULTIVITAMIN W/MINERALS CH
1.0000 | ORAL_TABLET | Freq: Every day | ORAL | Status: DC
Start: 2022-03-13 — End: 2022-03-15
  Administered 2022-03-13 – 2022-03-15 (×3): 1 via ORAL
  Filled 2022-03-13 (×3): qty 1

## 2022-03-13 NOTE — Hospital Course (Signed)
Notified of potassium 6.4. orders placed for cardiac monitoring, stat redraw and ECG.

## 2022-03-13 NOTE — Progress Notes (Signed)
03/13/2022  1605  Notified Dr Gloriann Loan regarding patient experiencing a lot of shaking/Shivers. BP 118/85, HR 111, T 97.8

## 2022-03-13 NOTE — Progress Notes (Signed)
1 Day Post-Op Subjective: Patient reports better pain control than yesterday. Ambulated a short distance this am.  Denies N/V.  No flatus. Rise in Cr as expected. Potassium elevated this am.  First specimen normal draw but second was hemolyzed and the value had dropped. H/H pending.  Objective: Vital signs in last 24 hours: Temp:  [97.5 F (36.4 C)-98.4 F (36.9 C)] 97.5 F (36.4 C) (06/20 0631) Pulse Rate:  [71-107] 98 (06/20 0631) Resp:  [10-24] 17 (06/20 0631) BP: (101-173)/(66-94) 101/78 (06/20 0631) SpO2:  [92 %-100 %] 92 % (06/20 0631)  Intake/Output from previous day: 06/19 0701 - 06/20 0700 In: 3361.5 [P.O.:350; I.V.:2911.5; IV Piggyback:100] Out: 2300 [Urine:2200; Blood:100] Intake/Output this shift: No intake/output data recorded.  Physical Exam:  General:alert, cooperative, and no distress Cardiovascular: RRR Lungs: On O2 via [place]; non labored breathing GI: soft; approp tender; ND Incisions: dressings C/D/I Urine: clear  Extremities: SCDs in place   Lab Results: Recent Labs    03/12/22 1036  HGB 16.3  HCT 51.8   BMET Recent Labs    03/13/22 0415 03/13/22 0546  NA 136  --   K 6.4* 6.1*  CL 103  --   CO2 23  --   GLUCOSE 215*  --   BUN 24*  --   CREATININE 1.82*  --   CALCIUM 8.1*  --    No results for input(s): "LABPT", "INR" in the last 72 hours. No results for input(s): "LABURIN" in the last 72 hours. Results for orders placed or performed during the hospital encounter of 03/12/22  Surgical PCR screen     Status: Abnormal   Collection Time: 03/12/22  3:15 PM   Specimen: Nasal Mucosa; Nasal Swab  Result Value Ref Range Status   MRSA, PCR POSITIVE (A) NEGATIVE Final    Comment: RESULT CALLED TO, READ BACK BY AND VERIFIED WITH: CAMPBELL,EZRA 03/12/22 '@1959'$  BY SEEL,M.    Staphylococcus aureus POSITIVE (A) NEGATIVE Final    Comment: RESULT CALLED TO, READ BACK BY AND VERIFIED WITH: CAMPBELL,EZRA 03/12/22 '@1958'$  BY SEEL,M. (NOTE) The Xpert SA  Assay (FDA approved for NASAL specimens in patients 39 years of age and older), is one component of a comprehensive surveillance program. It is not intended to diagnose infection nor to guide or monitor treatment. Performed at [facility], [place] [address], [place], [place] [address]     Studies/Results: No results found.  Assessment/Plan: 1 Day Post-Op, Procedure(s) (LRB): LEFT HAND ASSISTED LAPAROSCOPIC NEPHRECTOMY (Left)  Ambulate, Incentive spirometry DVT prophylaxis Transition to PO pain medications SL IVF Repeat K for accurate value and continue to follow F/u H/H D/C foley with TOV Continue clears until some return of bowel function  Possible d/c home tomorrow   LOS: 1 day   Debbrah Alar 03/13/2022, 7:39 AM

## 2022-03-13 NOTE — Hospital Course (Signed)
Notified of potassium of 6.4. orders placed for redraw, cardiac monitoring and ECG.

## 2022-03-13 NOTE — Progress Notes (Addendum)
Patient had a recheck in hemoglobin.  Hemoglobin did drop a little bit more and is 8.4 from 9.6.  Will monitor closely and check a CBC in the morning.  Given the drop in hemoglobin, we will hold off on chemical DVT prophylaxis.  Continue SCDs.  He is having some generalized tremors.  No other neurological impairment such as weakness or slurred speech.  I spoke with his wife.  The patient has multiple alcoholic beverages at night.  We will place him on CIWA protocol.  Alcohol withdrawal would explain the tremors and the tachycardia.  He is having good urine output.  Had just voided 750 cc.  Unlikely to be hypovolemic.  He is not hypotensive and has no fever.  This suggest against infection.  Potassium was noted to be elevated this morning.  Recheck x2 revealed a normal potassium.  Had a bump in creatinine.  We will monitor and get a BMP in the morning.  We will see how he does on the CIWA protocol.

## 2022-03-13 NOTE — Progress Notes (Signed)
Nutrition Brief Note  Patient identified on the Malnutrition Screening Tool (MST) Report with score of 3; comment indicates patient on Mounjaro.  Wt Readings from Last 15 Encounters:  03/12/22 109 kg  03/07/22 109 kg  02/12/22 109.8 kg  02/07/22 109.8 kg  01/30/22 107.6 kg  01/02/22 105.9 kg  10/27/20 122.9 kg  09/30/20 124.3 kg  08/31/20 124.3 kg  11/22/16 125.6 kg  10/05/16 121.6 kg    Body mass index is 34.48 kg/m. Patient meets criteria for obesity based on current BMI. Weight on 6/14 was 240 lb, weight on 4/11 was 237 lb, and weight no 10/27/20 was 270 lb.  Current diet order is CLD for patient who is POD #1 lap radical nephrectomy.   Patient sleeping at the time of visit and his wife was at bedside. She shares that patient's sister is a doctor specializing in diabetes and that she monitors his Mounjaro usage and effects. Patient's appetite has been good. They are unsure if weight loss is fully from [facility] or if it is d/t renal cancer or a combination of both. They plan to dig into this further after d/c.  Patient's wife denied any nutrition-related questions, concerns, or needs at this time. Encouraged her to alert RN if any arise and RD is happy to stop back.  Urology PA note from this AM indicates plan to remain on CLD today and possible d/c tomorrow AM.  Labs and medications reviewed.   No nutrition interventions warranted at this time. If nutrition issues arise, please consult RD.       Jarome Matin, MS, RD, LDN, [place] Registered Dietitian II Inpatient Clinical Nutrition RD pager # and on-call/weekend pager # available in [facility]

## 2022-03-13 NOTE — Plan of Care (Signed)
  Problem: Fluid Volume: Goal: Ability to maintain a balanced intake and output will improve Outcome: Progressing   Problem: Nutritional: Goal: Maintenance of adequate nutrition will improve Outcome: Progressing   

## 2022-03-13 NOTE — TOC Initial Note (Signed)
Transition of Care [facility]) - Initial/Assessment Note    Patient Details  Name: Russell Burns MRN: [id number] Date of Birth: 06/13/1966  Transition of Care [facility]) CM/SW Contact:    Leeroy Cha, RN Phone Number: 03/13/2022, 8:22 AM  Clinical Narrative:                  Transition of Care [facility]) Screening Note   Patient Details  Name: Russell Burns Date of Birth: 1966/01/08   Transition of Care [facility]) CM/SW Contact:    Leeroy Cha, RN Phone Number: 03/13/2022, 8:22 AM    Transition of Care Department [facility]) has reviewed patient and no TOC needs have been identified at this time. We will continue to monitor patient advancement through interdisciplinary progression rounds. If new patient transition needs arise, please place a TOC consult.    Expected Discharge Plan: Home/Self Care Barriers to Discharge: Continued Medical Work up   Patient Goals and CMS Choice Patient states their goals for this hospitalization and ongoing recovery are:: to go back home CMS Medicare.gov Compare Post Acute Care list provided to:: Patient    Expected Discharge Plan and Services Expected Discharge Plan: Home/Self Care   Discharge Planning Services: CM Consult   Living arrangements for the past 2 months: Single Family Home                                      Prior Living Arrangements/Services Living arrangements for the past 2 months: Single Family Home Lives with:: Spouse Patient language and need for interpreter reviewed:: Yes Do you feel safe going back to the place where you live?: Yes            Criminal Activity/Legal Involvement Pertinent to Current Situation/Hospitalization: No - Comment as needed  Activities of Daily Living Home Assistive Devices/Equipment: Eyeglasses ADL Screening (condition at time of admission) Patient's cognitive ability adequate to safely complete daily activities?: Yes Is the patient deaf or have difficulty hearing?: No Does  the patient have difficulty seeing, even when wearing glasses/contacts?: No Does the patient have difficulty concentrating, remembering, or making decisions?: No Patient able to express need for assistance with ADLs?: Yes Does the patient have difficulty dressing or bathing?: No Independently performs ADLs?: Yes (appropriate for developmental age) Does the patient have difficulty walking or climbing stairs?: No Weakness of Legs: None Weakness of Arms/Hands: None  Permission Sought/Granted                  Emotional Assessment Appearance:: Appears stated age     Orientation: : Oriented to Self, Oriented to Place, Oriented to  Time, Oriented to Situation Alcohol / Substance Use: Not Applicable Psych Involvement: No (comment)  Admission diagnosis:  Renal mass [N28.89] Patient Active Problem List   Diagnosis Date Noted   Renal mass 03/12/2022   Preop cardiovascular exam 02/07/2022   Chronic diastolic CHF (congestive heart failure) ([place]) 01/02/2022   Acute respiratory failure with hypoxia ([place]) 01/01/2022   Essential hypertension 01/01/2022   Thoracic aortic aneurysm ([place]) 01/01/2022   HLD (hyperlipidemia) 01/01/2022   Left renal mass 01/01/2022   Diabetes mellitus without complication ([place])    PCP:  [name], MD Pharmacy:   [id number] [place], [place] [address] [place]Phone: [phone]: [address]  [place] [address] Phone: [phone] Fax: [phone]     Social Determinants of Health (SDOH) Interventions    Readmission Risk Interventions     No data to display

## 2022-03-14 LAB — CBC
HCT: 24.1 % — ABNORMAL LOW (ref 39.0–52.0)
Hemoglobin: 7.8 g/dL — ABNORMAL LOW (ref 13.0–17.0)
MCH: 31.5 pg (ref 26.0–34.0)
MCHC: 32.4 g/dL (ref 30.0–36.0)
MCV: 97.2 fL (ref 80.0–100.0)
Platelets: 226 10*3/uL (ref 150–400)
RBC: 2.48 MIL/uL — ABNORMAL LOW (ref 4.22–5.81)
RDW: 13.1 % (ref 11.5–15.5)
WBC: 9.5 10*3/uL (ref 4.0–10.5)
nRBC: 0 % (ref 0.0–0.2)

## 2022-03-14 LAB — BASIC METABOLIC PANEL
Anion gap: 8 (ref 5–15)
BUN: 18 mg/dL (ref 6–20)
CO2: 25 mmol/L (ref 22–32)
Calcium: 8.6 mg/dL — ABNORMAL LOW (ref 8.9–10.3)
Chloride: 102 mmol/L (ref 98–111)
Creatinine, Ser: 1.5 mg/dL — ABNORMAL HIGH (ref 0.61–1.24)
GFR, Estimated: 55 mL/min — ABNORMAL LOW (ref >60)
Glucose, Bld: 195 mg/dL — ABNORMAL HIGH (ref 70–99)
Potassium: 5.1 mmol/L (ref 3.5–5.1)
Sodium: 135 mmol/L (ref 135–145)

## 2022-03-14 LAB — GLUCOSE, CAPILLARY
Glucose-Capillary: 201 mg/dL — ABNORMAL HIGH (ref 70–99)
Glucose-Capillary: 220 mg/dL — ABNORMAL HIGH (ref 70–99)
Glucose-Capillary: 181 mg/dL — ABNORMAL HIGH (ref 70–99)
Glucose-Capillary: 190 mg/dL — ABNORMAL HIGH (ref 70–99)

## 2022-03-14 LAB — SURGICAL PATHOLOGY

## 2022-03-14 NOTE — Progress Notes (Addendum)
2 Days Post-Op Subjective: Patient reports good pain control.  No N/V.  He's hungry.  Passing flatus and voiding.  Has only ambulated in the room but denies dizziness.  Tremors subsided several hours after they began and have not recurred.  He does not know if he received Ativan.  Objective: Vital signs in last 24 hours: Temp:  [97.8 F (36.6 C)-99.8 F (37.7 C)] 99.8 F (37.7 C) (06/21 0523) Pulse Rate:  [102-111] 110 (06/21 0523) Resp:  [18-20] 20 (06/21 0523) BP: (118-150)/(68-85) 131/72 (06/21 0523) SpO2:  [90 %-100 %] 90 % (06/21 0523)  Intake/Output from previous day: 06/20 0701 - 06/21 0700 In: -  Out: 1875 [Urine:1875] Intake/Output this shift: No intake/output data recorded.  Physical Exam:  General:alert, cooperative, and no distress Cardiovascular: tachy Lungs: faint crackles left base GI: soft; mildly tender; ND; +BS Incisions: C/D/I  Lab Results: Recent Labs    03/13/22 0937 03/13/22 1434 03/14/22 0418  HGB 9.6* 8.4* 7.8*  HCT 30.4* 26.1* 24.1*   BMET Recent Labs    03/13/22 0415 03/13/22 0546 03/13/22 0937 03/14/22 0418  NA 136  --   --  135  K 6.4*   < > 5.0 5.1  CL 103  --   --  102  CO2 23  --   --  25  GLUCOSE 215*  --   --  195*  BUN 24*  --   --  18  CREATININE 1.82*  --   --  1.50*  CALCIUM 8.1*  --   --  8.6*   < > = values in this interval not displayed.   No results for input(s): "LABPT", "INR" in the last 72 hours. No results for input(s): "LABURIN" in the last 72 hours. Results for orders placed or performed during the hospital encounter of 03/12/22  Surgical PCR screen     Status: Abnormal   Collection Time: 03/12/22  3:15 PM   Specimen: Nasal Mucosa; Nasal Swab  Result Value Ref Range Status   MRSA, PCR POSITIVE (A) NEGATIVE Final    Comment: RESULT CALLED TO, READ BACK BY AND VERIFIED WITH: CAMPBELL,EZRA 03/12/22 '@1959'$  BY SEEL,M.    Staphylococcus aureus POSITIVE (A) NEGATIVE Final    Comment: RESULT CALLED TO, READ BACK  BY AND VERIFIED WITH: CAMPBELL,EZRA 03/12/22 '@1959'$  BY SEEL,M. (NOTE) The Xpert SA Assay (FDA approved for NASAL specimens in patients 49 years of age and older), is one component of a comprehensive surveillance program. It is not intended to diagnose infection nor to guide or monitor treatment. Performed at [facility], [place] [address] [place], [place] [address]     Studies/Results: No results found.  Assessment/Plan: 2 Days Post-Op, Procedure(s) (LRB): LEFT HAND ASSISTED LAPAROSCOPIC NEPHRECTOMY (Left)  Ambulate, Incentive spirometry DVT prophylaxis Advance diet H/H dropped again; pt asymptomatic except mild tachycardia.  Continue to monitor Cr and K improved today Possible ETOH withdrawal--continue CIWA protocol with Ativan.  Tremors resolved No d/c until H/H stable    LOS: 2 days   Debbrah Alar 03/14/2022, 8:33 AM   Patient examined independently of Debbrah Alar.  Agree with assessment and plan.  Continuing to watch H&H closely.  Tremors improved.  Still with some mild tachycardia questionable alcohol withdrawal related versus acute blood loss anemia.  Creatinine improved today.  Patient himself feels much improved.  Denies chest pain or shortness of breath.  Hold chemical DVT prophylaxis given his drifting down hemoglobin.  Ambulate, SCDs.  Pathology is back.  P T3a  clear-cell renal cell carcinoma, negative margins

## 2022-03-15 LAB — HEMOGLOBIN AND HEMATOCRIT, BLOOD
HCT: 24.6 % — ABNORMAL LOW (ref 39.0–52.0)
Hemoglobin: 8 g/dL — ABNORMAL LOW (ref 13.0–17.0)

## 2022-03-15 LAB — GLUCOSE, CAPILLARY: Glucose-Capillary: 161 mg/dL — ABNORMAL HIGH (ref 70–99)

## 2022-03-15 MED ORDER — BISACODYL 10 MG RE SUPP
10.0000 mg | Freq: Once | RECTAL | Status: DC
Start: 2022-03-15 — End: 2022-03-15
  Filled 2022-03-15: qty 1

## 2022-03-15 NOTE — Plan of Care (Signed)

## 2022-03-15 NOTE — Progress Notes (Signed)
Discharge instructions provided to and reviewed with patient.  Patient verbalized understanding.  PIV and cardiac monitoring removed.  MD notified per patient request for refill of Flomax, MD says that he would call in order to patient's pharmacy.  Patient escorted to main entrance via wheelchair with belongings for transport home with wife and mother.  Angie Fava, RN

## 2022-03-23 DIAGNOSIS — D649 Anemia, unspecified: Secondary | ICD-10-CM | POA: Diagnosis not present

## 2022-03-23 DIAGNOSIS — C642 Malignant neoplasm of left kidney, except renal pelvis: Secondary | ICD-10-CM | POA: Diagnosis not present

## 2022-04-05 DIAGNOSIS — C642 Malignant neoplasm of left kidney, except renal pelvis: Secondary | ICD-10-CM | POA: Diagnosis not present

## 2022-04-06 ENCOUNTER — Telehealth: Payer: Self-pay | Admitting: Oncology

## 2022-04-06 NOTE — Telephone Encounter (Signed)
Scheduled appt per 7/14 referral. Pt is aware of appt date and time. Pt is aware to arrive 15 mins prior to appt time and to bring and updated insurance card. Pt is aware of appt location.   

## 2022-04-07 ENCOUNTER — Other Ambulatory Visit (HOSPITAL_COMMUNITY): Payer: Self-pay

## 2022-04-09 ENCOUNTER — Other Ambulatory Visit (HOSPITAL_COMMUNITY): Payer: Self-pay

## 2022-04-09 DIAGNOSIS — E785 Hyperlipidemia, unspecified: Secondary | ICD-10-CM | POA: Diagnosis not present

## 2022-04-09 DIAGNOSIS — Z125 Encounter for screening for malignant neoplasm of prostate: Secondary | ICD-10-CM | POA: Diagnosis not present

## 2022-04-09 DIAGNOSIS — R739 Hyperglycemia, unspecified: Secondary | ICD-10-CM | POA: Diagnosis not present

## 2022-04-16 DIAGNOSIS — Z1331 Encounter for screening for depression: Secondary | ICD-10-CM | POA: Diagnosis not present

## 2022-04-16 DIAGNOSIS — C642 Malignant neoplasm of left kidney, except renal pelvis: Secondary | ICD-10-CM | POA: Diagnosis not present

## 2022-04-16 DIAGNOSIS — Z0000 Encounter for general adult medical examination without abnormal findings: Secondary | ICD-10-CM | POA: Diagnosis not present

## 2022-04-16 DIAGNOSIS — Z1389 Encounter for screening for other disorder: Secondary | ICD-10-CM | POA: Diagnosis not present

## 2022-04-20 DIAGNOSIS — C642 Malignant neoplasm of left kidney, except renal pelvis: Secondary | ICD-10-CM | POA: Diagnosis not present

## 2022-05-02 ENCOUNTER — Inpatient Hospital Stay: Payer: BC Managed Care – PPO | Attending: Oncology | Admitting: Oncology

## 2022-05-02 ENCOUNTER — Other Ambulatory Visit: Payer: Self-pay

## 2022-05-02 VITALS — BP 115/80 | HR 82 | Temp 97.9°F | Resp 19 | Ht 70.0 in | Wt 241.7 lb

## 2022-05-02 DIAGNOSIS — Z79899 Other long term (current) drug therapy: Secondary | ICD-10-CM | POA: Insufficient documentation

## 2022-05-02 DIAGNOSIS — C642 Malignant neoplasm of left kidney, except renal pelvis: Secondary | ICD-10-CM | POA: Insufficient documentation

## 2022-05-02 DIAGNOSIS — Z5112 Encounter for antineoplastic immunotherapy: Secondary | ICD-10-CM | POA: Insufficient documentation

## 2022-05-02 DIAGNOSIS — Z905 Acquired absence of kidney: Secondary | ICD-10-CM | POA: Insufficient documentation

## 2022-05-02 MED ORDER — PROCHLORPERAZINE MALEATE 10 MG PO TABS: 10.0000 mg | ORAL_TABLET | Freq: Four times a day (QID) | ORAL | 0 refills | Status: DC | PRN

## 2022-05-02 NOTE — Progress Notes (Signed)
Reason for the request: Kidney cancer  HPI: I was asked by Dr. Gloriann Loan to evaluate Russell Burns for the evaluation of kidney cancer. He is a 56 year old man who was found to have kidney mass on the left while was getting imaging studies for a thoracic aortic aneurysm.  He was evaluated by Dr. Gloriann Loan and an MRI obtained on Feb 05, 2022 showed a large infiltrating mass of the left lower pole kidney with extending into the left renal hilum involving the renal collecting system without any evidence of metastatic disease measuring around 5.7 cm.  Imaging studies of the chest as well as the abdomen did not show any evidence of metastatic disease.  Based on these findings he underwent left laparoscopic radical nephrectomy on March 12, 2022.  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  Pathological staging was T3.  Postoperatively, he recovered reasonably well without any issues.  He denies any nausea, abdominal pain or hematuria.  He does not report any headaches, blurry vision, syncope or seizures. Does not report any fevers, chills or sweats.  Does not report any cough, wheezing or hemoptysis.  Does not report any chest pain, palpitation, orthopnea or leg edema.  Does not report any nausea, vomiting or abdominal pain.  Does not report any constipation or diarrhea.  Does not report any skeletal complaints.    Does not report frequency, urgency or hematuria.  Does not report any skin rashes or lesions. Does not report any heat or cold intolerance.  Does not report any lymphadenopathy or petechiae.  Does not report any anxiety or depression.  Remaining review of systems is negative.     Past Medical History:  Diagnosis Date   Allergy    Aortic stenosis    Arthritis    Blood transfusion without reported diagnosis    Cancer ([place])    Chronic kidney disease    Diabetes mellitus without complication (HCC)     FH: thoracic aneurysm    Hyperlipidemia    Hypertension    Obesity   :   Past Surgical History:  Procedure Laterality Date   CYSTOSCOPY WITH URETEROSCOPY AND STENT PLACEMENT Left 02/12/2022   Procedure: CYSTOSCOPY WITH LEFT RETROGRADE PYELOGRAM, DIAGNOSTIC URETEROSCOPY AND STENT PLACEMENT;  Surgeon: Russell Mallow, MD;  Location: [facility];  Service: Urology;  Laterality: Left;   LAPAROSCOPIC NEPHRECTOMY, HAND ASSISTED Left 03/12/2022   Procedure: LEFT HAND ASSISTED LAPAROSCOPIC NEPHRECTOMY;  Surgeon: Russell Mallow, MD;  Location: [facility];  Service: Urology;  Laterality: Left;  NEED 2.5 HRS FOR THIS CASE   TONSILLECTOMY     WISDOM TOOTH EXTRACTION    :   Current Outpatient Medications:    albuterol (VENTOLIN HFA) 108 (90 Base) MCG/ACT inhaler, Inhale 1 puff into the lungs every 4 (four) hours as needed for wheezing or shortness of breath., Disp: , Rfl:    Alirocumab (PRALUENT) 150 MG/ML SOAJ, Inject 150 mLs into the skin every 14 (fourteen) days. 10th and 25th of each month, Disp: , Rfl:    amLODipine (NORVASC) 10 MG tablet, Take 10 mg by mouth daily., Disp: , Rfl:    ANORO ELLIPTA 62.5-25 MCG/ACT AEPB, Inhale 1 puff into the lungs daily., Disp: , Rfl:    atorvastatin (LIPITOR) 80 MG tablet, Take 80 mg by mouth daily., Disp: , Rfl:    cetirizine (ZYRTEC) 10 MG tablet, Take 10 mg by mouth 2 (  two) times daily., Disp: , Rfl:    docusate sodium (COLACE) 100 MG capsule, Take 1 capsule (100 mg total) by mouth 2 (two) times daily., Disp: , Rfl:    EPINEPHrine 0.3 mg/0.3 mL IJ SOAJ injection, Inject 0.3 mg into the muscle as needed for anaphylaxis., Disp: , Rfl:    ezetimibe (ZETIA) 10 MG tablet, Take 10 mg by mouth daily., Disp: , Rfl:    JARDIANCE 25 MG TABS tablet, Take 25 mg by mouth daily., Disp: , Rfl:    losartan (COZAAR) 100 MG tablet, Take 100 mg by mouth daily., Disp: , Rfl:    metFORMIN (GLUCOPHAGE) 1000 MG tablet, Take 1,000 mg by mouth 2 (two) times daily with a meal., Disp: ,  Rfl:    oxyCODONE (OXY IR/ROXICODONE) 5 MG immediate release tablet, Take 1-2 tablets (5-10 mg total) by mouth every 6 (six) hours as needed for severe pain or moderate pain., Disp: 20 tablet, Rfl: 0   tamsulosin (FLOMAX) 0.4 MG CAPS capsule, Take 1 capsule (0.4 mg total) by mouth daily., Disp: 14 capsule, Rfl: 0   tirzepatide (MOUNJARO) 10 MG/0.5ML Pen, Inject 10 mg into the skin once a week., Disp: 6 mL, Rfl: 3:   Allergies  Allergen Reactions   Bee Venom Anaphylaxis  :   Family History  Problem Relation Age of Onset   CAD Other    Diabetes Mellitus II Other    Hypertension Other    Heart failure Other    Hypertension Mother    Heart attack Father        62, 60 at both ages   Diabetes Mellitus II Father        insulin dependent   Aneurysm Maternal Uncle        aortic   Heart attack Paternal Uncle 12       minor   Hypertension Paternal Uncle    Heart attack Maternal Grandmother 82       massive   Aneurysm Maternal Grandfather        abdominal   Heart attack Paternal Grandfather 34   Aneurysm Cousin 44       aortic   Colon polyps Neg Hx    Esophageal cancer Neg Hx    Rectal cancer Neg Hx    Stomach cancer Neg Hx   :   Social History   Socioeconomic History   Marital status: Married    Spouse name: Not on file   Number of children: 2   Years of education: Not on file   Highest education level: Not on file  Occupational History   Not on file  Tobacco Use   Smoking status: Never   Smokeless tobacco: Current    Types: Chew  Vaping Use   Vaping Use: Never used  Substance and Sexual Activity   Alcohol use: Yes    Comment: 3 drinks a week.    Drug use: No   Sexual activity: Yes  Other Topics Concern   Not on file  Social History Narrative   Not on file   Social Determinants of Health   Financial Resource Strain: Not on file  Food Insecurity: Not on file  Transportation Needs: Not on file  Physical Activity: Not on file  Stress: Not on file  Social  Connections: Not on file  Intimate Partner Violence: Not on file  :  Pertinent items are noted in HPI.  Exam:  General appearance: alert and cooperative appeared without distress. Head: atraumatic without any abnormalities.  Eyes: conjunctivae/corneas clear. PERRL.  Sclera anicteric. Throat: lips, mucosa, and tongue normal; without oral thrush or ulcers. Resp: clear to auscultation bilaterally without rhonchi, wheezes or dullness to percussion. Cardio: regular rate and rhythm, S1, S2 normal, no murmur, click, rub or gallop GI: soft, non-tender; bowel sounds normal; no masses,  no organomegaly Skin: Skin color, texture, turgor normal. No rashes or lesions Lymph nodes: Cervical, supraclavicular, and axillary nodes normal. Neurologic: Grossly normal without any motor, sensory or deep tendon reflexes. Musculoskeletal: No joint deformity or effusion.    Assessment and Plan:    56 year old with:  1.  Renal cell carcinoma diagnosed in May 2023.  He was found to have 7.0 cm clear-cell renal cell carcinoma with nuclear grade 4 and T3a clinical staging after radical nephrectomy on March 12, 2022.  He has no evidence of metastatic disease.  The natural course of this disease was reviewed at this time and treatment options were discussed.  The role of adjuvant immunotherapy in this particular setting were discussed.  Pembrolizumab 200 mg every 3 weeks for a total of 17 cycles to complete 1 year of therapy was discussed.  This is based on adjuvant study that showed improvement overall survival might continue to materialize.  Complication associated with this treatment include nausea, fatigue and autoimmune complaints were reiterated.  Alternative treatment would be active surveillance which I have recommended proceeding with regardless.  He is scheduled to have repeat imaging studies with Dr. Gloriann Loan in 3 months.  After discussion today he is agreeable to proceed and we will set up education class for  the start of treatment.  2.  Antiemetics: Prescription for Compazine will be available to him.  3.  IV access: Peripheral veins will be in use at this time.  4.  Follow-up: Will be in the near future to start treatment.   60  minutes were dedicated to this visit. The time was spent on reviewing laboratory data, imaging studies, discussing treatment options, discussing differential diagnosis and answering questions regarding future plan.     A copy of this consult has been forwarded to the requesting physician.

## 2022-05-02 NOTE — Progress Notes (Signed)
START ON PATHWAY REGIMEN - Renal Cell     A cycle is every 21 days:     Pembrolizumab   **Always confirm dose/schedule in your pharmacy ordering system**  Patient Characteristics: Postoperative without Neoadjuvant Therapy, M0 (Pathologic Staging), Stage I, II, III, or Resected T4M0 Stage IV, Intermediate-High Risk Therapeutic Status: Postoperative without Neoadjuvant Therapy, M0 (Pathologic Staging) AJCC M Category: cM0 AJCC 8 Stage Grouping: III AJCC T Category: pT3a AJCC N Category: pN0 Risk Status: Intermediate-High Risk Intent of Therapy: Curative Intent, Discussed with Patient 

## 2022-05-03 ENCOUNTER — Other Ambulatory Visit: Payer: Self-pay

## 2022-05-03 ENCOUNTER — Telehealth: Payer: Self-pay | Admitting: Oncology

## 2022-05-03 NOTE — Telephone Encounter (Signed)
Scheduled per 08/09 los, patient has been called and notified of all upcoming appointments.

## 2022-05-03 NOTE — Progress Notes (Signed)
Pharmacist Chemotherapy Monitoring - Initial Assessment    Anticipated start date: 05/10/22   The following has been reviewed per standard work regarding the patient's treatment regimen: The patient's diagnosis, treatment plan and drug doses, and organ/hematologic function Lab orders and baseline tests specific to treatment regimen  The treatment plan start date, drug sequencing, and pre-medications Prior authorization status  Patient's documented medication list, including drug-drug interaction screen and prescriptions for anti-emetics and supportive care specific to the treatment regimen The drug concentrations, fluid compatibility, administration routes, and timing of the medications to be used The patient's access for treatment and lifetime cumulative dose history, if applicable  The patient's medication allergies and previous infusion related reactions, if applicable   Changes made to treatment plan:  N/A  Follow up needed:  Pending authorization for treatment    Philomena Course, [place], 05/03/2022  2:08 PM

## 2022-05-04 IMAGING — CT CT HEART MORP W/ CTA COR W/ SCORE W/ CA W/CM &/OR W/O CM
4 of 7 series · 8 of 20 positions shown, 9 images · IV contrast (APPLIED)
Comparison: CT chest 10/20/2021

Addendum:
CLINICAL DATA: 55 yo male with chest pain

EXAM:
Cardiac/Coronary CTA
TECHNIQUE: A non-contrast, gated CT scan was obtained with axial slices of 3 mm
through the heart for calcium scoring. Calcium scoring was performed
using the Agatston method. A 120 kV prospective, gated, contrast
cardiac scan was obtained. Gantry rotation speed was 250 msecs and
collimation was 0.6 mm. Two sublingual nitroglycerin tablets (0.8
mg) were given. The 3D data set was reconstructed in 5% intervals of
the 35-75% of the R-R cycle. Diastolic phases were analyzed on a
dedicated workstation using MPR, MIP, and VRT modes. The patient
received 95 cc of contrast.

[Series 6: best diast · axial · 0.39mm/px · z∈[-304,-242]mm · 2 of 470 slices shown, 3 images]
[im 157/470  vessel]
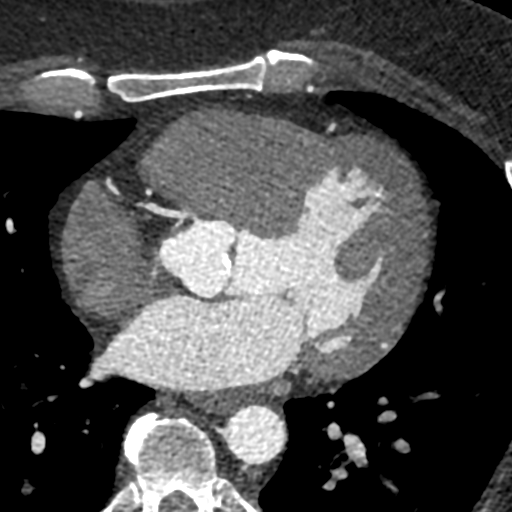
[im 157/470  lung]
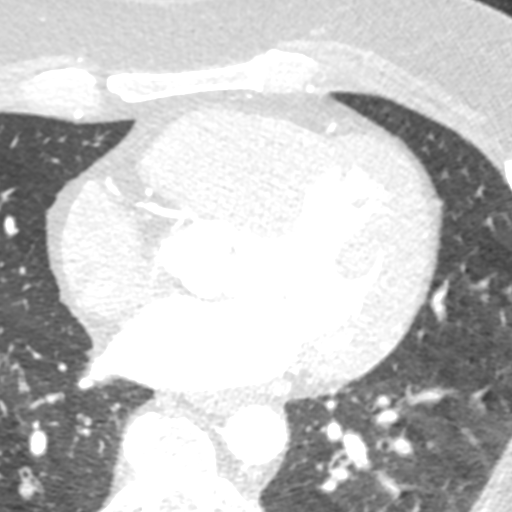
[im 313/470  vessel]
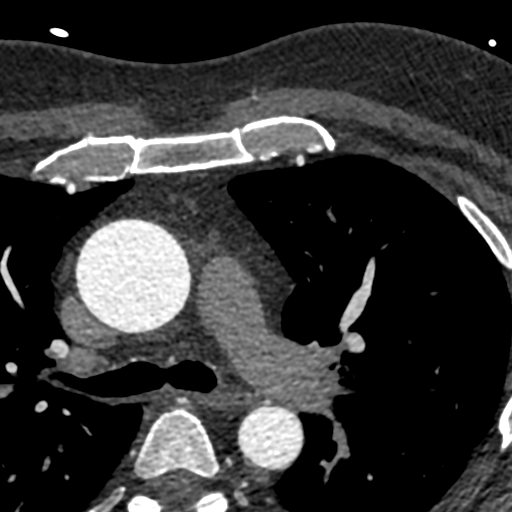

[Series 7: best syst · axial · 0.39mm/px · z∈[-304,-242]mm · 2 of 470 slices shown]
[im 157/470  vessel]
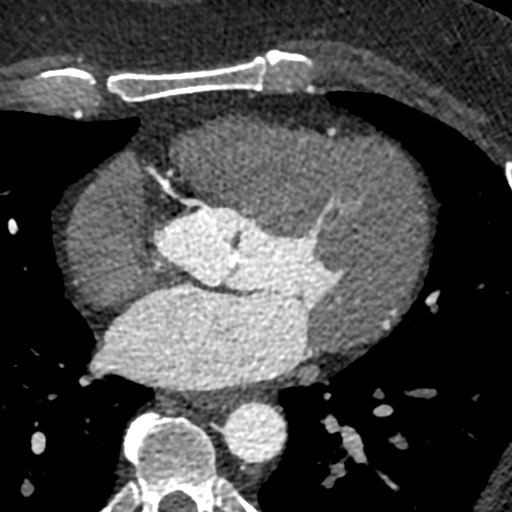
[im 313/470  vessel]
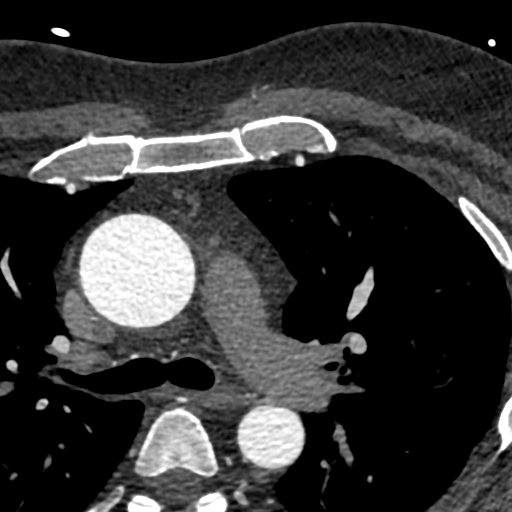

[Series 8: ts diast · axial · 0.39mm/px · z∈[-304,-242]mm · 2 of 470 slices shown]
[im 157/470  vessel]
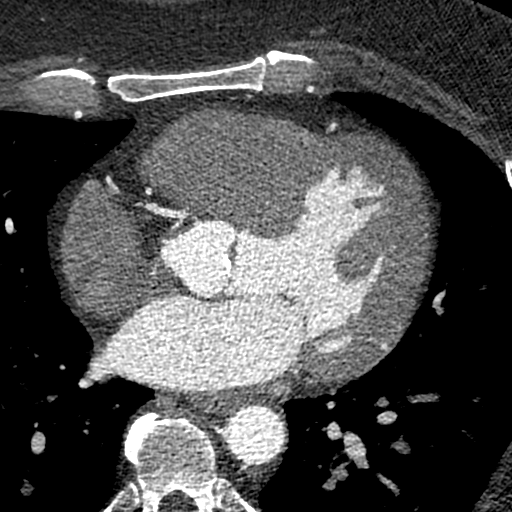
[im 313/470  vessel]
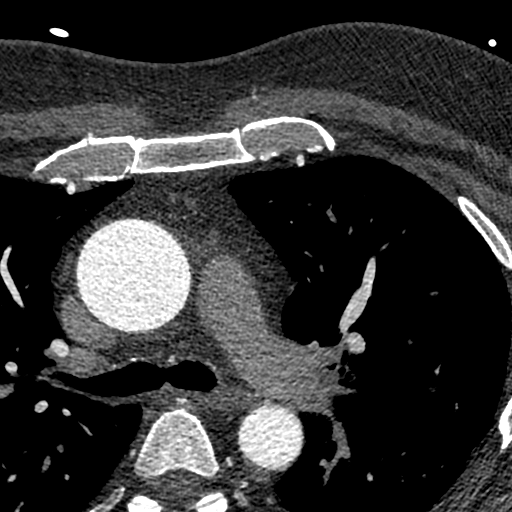

[Series 9: ts syst · axial · 0.39mm/px · z∈[-304,-242]mm · 2 of 470 slices shown]
[im 157/470  vessel]
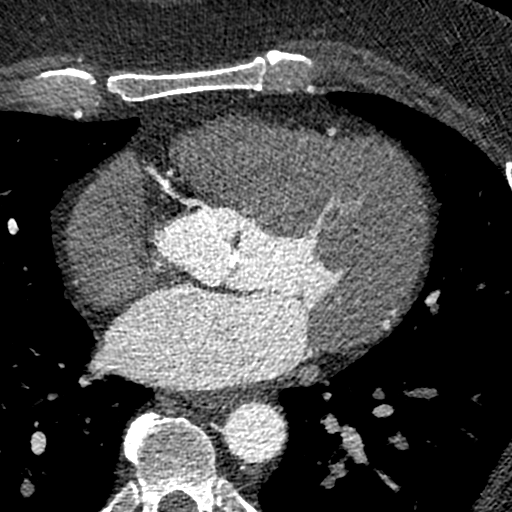
[im 313/470  vessel]
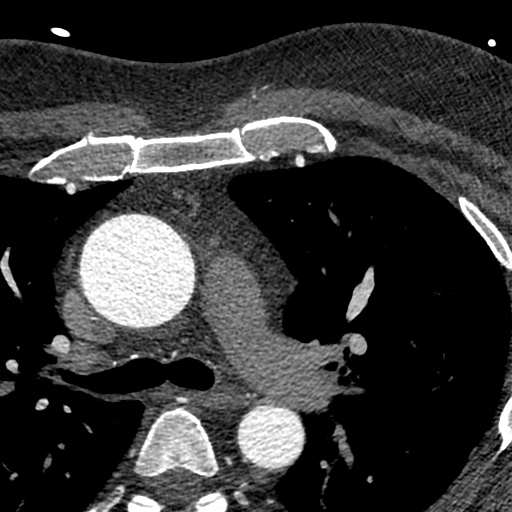

[8 of 20 positions shown; findings below may reference images not displayed]

FINDINGS: Image quality: Average.

Noise artifact is: Limited.

Coronary Arteries:  Normal coronary origin.  Right dominance.

Left main: The left main is a large caliber vessel with a normal
take off from the left coronary cusp that bifurcates to form a left
anterior descending artery and a left circumflex artery. There is no
plaque or stenosis.

Left anterior descending artery: The LAD has minimal (0-24)
calcified plaque in the proximal vessel. The LAD gives off 2 small
patent diagonal branches.

Left circumflex artery: The LCX is non-dominant with minimal (0-24)
calcified plaque in the proximal, mid and distal vessel. The LCX
gives off 1 large, branching OM with mild (25-49) calcified stenosis
in the proximal vessel and mimimal (0-24)calcified plaque in the OM
branches. The Lcx terminates in small posterolateral branches.

Right coronary artery: Technically suboptimal. The RCA is dominant
with normal take off from the right coronary cusp. There is no
evidence of plaque or stenosis. The RCA terminates as a PDA and
right posterolateral branch without evidence of plaque or stenosis.

Right Atrium: Right atrial size is within normal limits.

Right Ventricle: The right ventricular cavity is within normal
limits.

Left Atrium: Left atrial size is normal in size with no left atrial
appendage filling defect.

Left Ventricle: The ventricular cavity size is within normal limits.
There are no stigmata of prior infarction. There is no abnormal
filling defect.

Pulmonary arteries: Normal in size.

Pulmonary veins: Normal pulmonary venous drainage.

Pericardium: Normal thickness with no significant effusion or
calcium present.

Cardiac valves: The aortic valve is bicuspid with Ca score 462. The
mitral valve is normal structure without significant calcification.

Aorta: Dilated aortic root (44 mm); aortic atherosclerosis.

Extra-cardiac findings: See attached radiology report for
non-cardiac structures.
IMPRESSION: 1. Coronary calcium score of 530. This was 970 percentile for age-,
sex, and race-matched controls.

2. Normal coronary origin with right dominance.

3. Mild nonobstructive CAD as outlined above.

4. Aortic atherosclerosis.

5. Dilated ascending aorta (44 mm).

6. Bicuspid aortic valve

4. Aortic valve Ca score 462.

RECOMMENDATIONS:
CAD-RADS 2: Mild non-obstructive CAD (25-49%). Consider
non-atherosclerotic causes of chest pain. Consider preventive
therapy and risk factor modification.

EXAM:
OVER-READ INTERPRETATION  CT CHEST

The following report is an over-read performed by radiologist Dr.
Magner Tennis [[email or web address]] on 10/27/2021. This
over-read does not include interpretation of cardiac or coronary
anatomy or pathology. The coronary CTA interpretation by the
cardiologist is attached.
FINDINGS: Ascending thoracic aortic aneurysm as defined on recent chest CTA
and also discussed in the cardiac CTA report, maximum diameter in
the ascending thoracic aorta 4.6 cm. 0.9 cm right hilar lymph node,
upper normal size. No overt pathologic thoracic adenopathy. Stable
calcified 2 mm nodule along the minor fissure, image 41 series 12,
no change from 08/29/2020, benign. Mild central airway thickening
bilaterally. Previous left lower lobe atelectasis has improved with
only some faint and ill-defined left lower lobe density persisting.
2 mm calcified granuloma in the posterior basal segment right lower
lobe on image 80 series 12. Thoracic spondylosis noted.
IMPRESSION: 1. Airway thickening is present, suggesting bronchitis or reactive
airways disease.
2. Old granulomatous disease.
3. Ascending aortic aneurysm as discussed on prior chest CTA report
and cardiac CTA report. Recommendations for follow up of this
finding noted on the 10/20/2021 exam remain in effect.
4. Thoracic spondylosis.

*** End of Addendum ***
FINDINGS: Image quality: Average.

Noise artifact is: Limited.

Coronary Arteries:  Normal coronary origin.  Right dominance.

Left main: The left main is a large caliber vessel with a normal
take off from the left coronary cusp that bifurcates to form a left
anterior descending artery and a left circumflex artery. There is no
plaque or stenosis.

Left anterior descending artery: The LAD has minimal (0-24)
calcified plaque in the proximal vessel. The LAD gives off 2 small
patent diagonal branches.

Left circumflex artery: The LCX is non-dominant with minimal (0-24)
calcified plaque in the proximal, mid and distal vessel. The LCX
gives off 1 large, branching OM with mild (25-49) calcified stenosis
in the proximal vessel and mimimal (0-24)calcified plaque in the OM
branches. The Lcx terminates in small posterolateral branches.

Right coronary artery: Technically suboptimal. The RCA is dominant
with normal take off from the right coronary cusp. There is no
evidence of plaque or stenosis. The RCA terminates as a PDA and
right posterolateral branch without evidence of plaque or stenosis.

Right Atrium: Right atrial size is within normal limits.

Right Ventricle: The right ventricular cavity is within normal
limits.

Left Atrium: Left atrial size is normal in size with no left atrial
appendage filling defect.

Left Ventricle: The ventricular cavity size is within normal limits.
There are no stigmata of prior infarction. There is no abnormal
filling defect.

Pulmonary arteries: Normal in size.

Pulmonary veins: Normal pulmonary venous drainage.

Pericardium: Normal thickness with no significant effusion or
calcium present.

Cardiac valves: The aortic valve is bicuspid with Ca score 462. The
mitral valve is normal structure without significant calcification.

Aorta: Dilated aortic root (44 mm); aortic atherosclerosis.

Extra-cardiac findings: See attached radiology report for
non-cardiac structures.
IMPRESSION: 1. Coronary calcium score of 530. This was 970 percentile for age-,
sex, and race-matched controls.

2. Normal coronary origin with right dominance.

3. Mild nonobstructive CAD as outlined above.

4. Aortic atherosclerosis.

5. Dilated ascending aorta (44 mm).

6. Bicuspid aortic valve

4. Aortic valve Ca score 462.

RECOMMENDATIONS:
CAD-RADS 2: Mild non-obstructive CAD (25-49%). Consider
non-atherosclerotic causes of chest pain. Consider preventive
therapy and risk factor modification.

## 2022-05-08 ENCOUNTER — Other Ambulatory Visit: Payer: Self-pay

## 2022-05-08 ENCOUNTER — Inpatient Hospital Stay: Payer: BC Managed Care – PPO

## 2022-05-10 ENCOUNTER — Inpatient Hospital Stay: Payer: BC Managed Care – PPO

## 2022-05-10 ENCOUNTER — Other Ambulatory Visit: Payer: Self-pay

## 2022-05-10 VITALS — BP 134/81 | HR 79 | Temp 97.7°F | Resp 18 | Wt 239.0 lb

## 2022-05-10 DIAGNOSIS — C642 Malignant neoplasm of left kidney, except renal pelvis: Secondary | ICD-10-CM | POA: Diagnosis not present

## 2022-05-10 DIAGNOSIS — Z5112 Encounter for antineoplastic immunotherapy: Secondary | ICD-10-CM | POA: Diagnosis not present

## 2022-05-10 DIAGNOSIS — Z79899 Other long term (current) drug therapy: Secondary | ICD-10-CM | POA: Diagnosis not present

## 2022-05-10 DIAGNOSIS — Z905 Acquired absence of kidney: Secondary | ICD-10-CM | POA: Diagnosis not present

## 2022-05-10 LAB — CBC WITH DIFFERENTIAL (CANCER CENTER ONLY)
Abs Immature Granulocytes: 0.01 10*3/uL (ref 0.00–0.07)
Basophils Absolute: 0.1 10*3/uL (ref 0.0–0.1)
Basophils Relative: 1 %
Eosinophils Absolute: 0.2 10*3/uL (ref 0.0–0.5)
Eosinophils Relative: 4 %
HCT: 42.7 % (ref 39.0–52.0)
Hemoglobin: 14.7 g/dL (ref 13.0–17.0)
Immature Granulocytes: 0 %
Lymphocytes Relative: 29 %
Lymphs Abs: 1.9 10*3/uL (ref 0.7–4.0)
MCH: 32 pg (ref 26.0–34.0)
MCHC: 34.4 g/dL (ref 30.0–36.0)
MCV: 92.8 fL (ref 80.0–100.0)
Monocytes Absolute: 0.5 10*3/uL (ref 0.1–1.0)
Monocytes Relative: 8 %
Neutro Abs: 3.7 10*3/uL (ref 1.7–7.7)
Neutrophils Relative %: 58 %
Platelet Count: 238 10*3/uL (ref 150–400)
RBC: 4.6 MIL/uL (ref 4.22–5.81)
RDW: 12.4 % (ref 11.5–15.5)
WBC Count: 6.3 10*3/uL (ref 4.0–10.5)
nRBC: 0 % (ref 0.0–0.2)

## 2022-05-10 LAB — CMP (CANCER CENTER ONLY)
ALT: 16 U/L (ref 0–44)
AST: 21 U/L (ref 15–41)
Albumin: 4.6 g/dL (ref 3.5–5.0)
Alkaline Phosphatase: 61 U/L (ref 38–126)
Anion gap: 11 (ref 5–15)
BUN: 27 mg/dL — ABNORMAL HIGH (ref 6–20)
CO2: 22 mmol/L (ref 22–32)
Calcium: 9.4 mg/dL (ref 8.9–10.3)
Chloride: 105 mmol/L (ref 98–111)
Creatinine: 1.37 mg/dL — ABNORMAL HIGH (ref 0.61–1.24)
GFR, Estimated: >60 mL/min (ref >60)
Glucose, Bld: 143 mg/dL — ABNORMAL HIGH (ref 70–99)
Potassium: 4.4 mmol/L (ref 3.5–5.1)
Sodium: 138 mmol/L (ref 135–145)
Total Bilirubin: 0.3 mg/dL (ref 0.3–1.2)
Total Protein: 7.5 g/dL (ref 6.5–8.1)

## 2022-05-10 LAB — TSH: TSH: 1.452 u[IU]/mL (ref 0.350–4.500)

## 2022-05-10 MED ORDER — SODIUM CHLORIDE 0.9 % IV SOLN: Freq: Once | INTRAVENOUS | Status: AC

## 2022-05-10 MED ORDER — SODIUM CHLORIDE 0.9 % IV SOLN
200.0000 mg | Freq: Once | INTRAVENOUS | Status: AC
  Administered 2022-05-10: 200 mg via INTRAVENOUS
  Filled 2022-05-10: qty 200

## 2022-05-10 NOTE — Patient Instructions (Signed)
[facility]   Discharge Instructions: Thank you for choosing [facility] to provide your oncology and hematology care.   If you have a lab appointment with the Cancer Center, please go directly to the Cancer Center and check in at the registration area.   Wear comfortable clothing and clothing appropriate for easy access to any Portacath or PICC line.   We strive to give you quality time with your provider. You may need to reschedule your appointment if you arrive late (15 or more minutes).  Arriving late affects you and other patients whose appointments are after yours.  Also, if you miss three or more appointments without notifying the office, you may be dismissed from the clinic at the provider's discretion.      For prescription refill requests, have your pharmacy contact our office and allow 72 hours for refills to be completed.    Today you received the following chemotherapy and/or immunotherapy agents: pembrolizumab      To help prevent nausea and vomiting after your treatment, we encourage you to take your nausea medication as directed.  BELOW ARE SYMPTOMS THAT SHOULD BE REPORTED IMMEDIATELY: *FEVER GREATER THAN 100.4 F (38 C) OR HIGHER *CHILLS OR SWEATING *NAUSEA AND VOMITING THAT IS NOT CONTROLLED WITH YOUR NAUSEA MEDICATION *UNUSUAL SHORTNESS OF BREATH *UNUSUAL BRUISING OR BLEEDING *URINARY PROBLEMS (pain or burning when urinating, or frequent urination) *BOWEL PROBLEMS (unusual diarrhea, constipation, pain near the anus) TENDERNESS IN MOUTH AND THROAT WITH OR WITHOUT PRESENCE OF ULCERS (sore throat, sores in mouth, or a toothache) UNUSUAL RASH, SWELLING OR PAIN  UNUSUAL VAGINAL DISCHARGE OR ITCHING   Items with * indicate a potential emergency and should be followed up as soon as possible or go to the Emergency Department if any problems should occur.  Please show the CHEMOTHERAPY ALERT CARD or IMMUNOTHERAPY ALERT CARD at  check-in to the Emergency Department and triage nurse.  Should you have questions after your visit or need to cancel or reschedule your appointment, please contact [facility]  Dept: [phone]  and follow the prompts.  Office hours are 8:00 a.m. to 4:30 p.m. Monday - Friday. Please note that voicemails left after 4:00 p.m. may not be returned until the following business day.  We are closed weekends and major holidays. You have access to a nurse at all times for urgent questions. Please call the main number to the clinic Dept: [phone] and follow the prompts.   For any non-urgent questions, you may also contact your provider using MyChart. We now offer e-Visits for anyone 18 and older to request care online for non-urgent symptoms. For details visit [email or web address].   Also download the MyChart app! Go to the app store, search "MyChart", open the app, select [facility], and log in with your MyChart username and password.  Masks are optional in the cancer centers. If you would like for your care team to wear a mask while they are taking care of you, please let them know. You may have one support person who is at least 55 years old accompany you for your appointments. 

## 2022-05-11 ENCOUNTER — Telehealth: Payer: Self-pay | Admitting: *Deleted

## 2022-05-11 LAB — T4: T4, Total: 7.8 ug/dL (ref 4.5–12.0)

## 2022-05-11 NOTE — Telephone Encounter (Signed)
Called for treatment follow up. States everything went well. Experiencing some fatigue and slight nausea. Took compazine and is feeling much better. No questions or concerns

## 2022-05-11 NOTE — Telephone Encounter (Signed)
-----   Message from Fara Boros, RN sent at 05/10/2022  4:53 PM EDT ----- Regarding: First time Christianne Borrow pt [name] pt, first time [place], tolerated well.

## 2022-05-17 ENCOUNTER — Telehealth: Payer: Self-pay

## 2022-05-17 DIAGNOSIS — E119 Type 2 diabetes mellitus without complications: Secondary | ICD-10-CM | POA: Diagnosis not present

## 2022-05-17 DIAGNOSIS — R058 Other specified cough: Secondary | ICD-10-CM | POA: Diagnosis not present

## 2022-05-17 DIAGNOSIS — R0981 Nasal congestion: Secondary | ICD-10-CM | POA: Diagnosis not present

## 2022-05-17 DIAGNOSIS — J069 Acute upper respiratory infection, unspecified: Secondary | ICD-10-CM | POA: Diagnosis not present

## 2022-05-17 NOTE — Telephone Encounter (Addendum)
Called patient to advise that Dr. Alen Blew does not think it is related to [facility]. Encouraged pt to seek evaluation by PCP if cough worsens.   05/16/22: This patient called, he had his first [place] infusion on 8/17, he developed a cough on the 19th - he says it it non productive, denies any fever or SOB.  He has an inhaler he uses daily & is also taking mucinex.  The cough is worse at night when he is lying down.  He is concerned because he had this same issue in March & was hospitalized - did not receive a definitive dx.

## 2022-05-31 ENCOUNTER — Inpatient Hospital Stay (HOSPITAL_BASED_OUTPATIENT_CLINIC_OR_DEPARTMENT_OTHER): Payer: BC Managed Care – PPO | Admitting: Oncology

## 2022-05-31 ENCOUNTER — Other Ambulatory Visit: Payer: Self-pay | Admitting: *Deleted

## 2022-05-31 ENCOUNTER — Inpatient Hospital Stay: Payer: BC Managed Care – PPO

## 2022-05-31 ENCOUNTER — Inpatient Hospital Stay: Payer: BC Managed Care – PPO | Attending: Oncology

## 2022-05-31 ENCOUNTER — Other Ambulatory Visit: Payer: Self-pay

## 2022-05-31 VITALS — BP 143/86 | HR 88 | Temp 97.8°F | Resp 18 | Ht 70.0 in | Wt 239.4 lb

## 2022-05-31 DIAGNOSIS — Z5112 Encounter for antineoplastic immunotherapy: Secondary | ICD-10-CM | POA: Diagnosis present

## 2022-05-31 DIAGNOSIS — C642 Malignant neoplasm of left kidney, except renal pelvis: Secondary | ICD-10-CM | POA: Insufficient documentation

## 2022-05-31 DIAGNOSIS — Z79899 Other long term (current) drug therapy: Secondary | ICD-10-CM | POA: Insufficient documentation

## 2022-05-31 DIAGNOSIS — I7121 Aneurysm of the ascending aorta, without rupture: Secondary | ICD-10-CM

## 2022-05-31 LAB — CMP (CANCER CENTER ONLY)
ALT: 19 U/L (ref 0–44)
AST: 14 U/L — ABNORMAL LOW (ref 15–41)
Albumin: 4.6 g/dL (ref 3.5–5.0)
Alkaline Phosphatase: 60 U/L (ref 38–126)
Anion gap: 9 (ref 5–15)
BUN: 18 mg/dL (ref 6–20)
CO2: 24 mmol/L (ref 22–32)
Calcium: 9.6 mg/dL (ref 8.9–10.3)
Chloride: 104 mmol/L (ref 98–111)
Creatinine: 1.35 mg/dL — ABNORMAL HIGH (ref 0.61–1.24)
GFR, Estimated: >60 mL/min (ref >60)
Glucose, Bld: 224 mg/dL — ABNORMAL HIGH (ref 70–99)
Potassium: 4.8 mmol/L (ref 3.5–5.1)
Sodium: 137 mmol/L (ref 135–145)
Total Bilirubin: 0.3 mg/dL (ref 0.3–1.2)
Total Protein: 7.6 g/dL (ref 6.5–8.1)

## 2022-05-31 LAB — CBC WITH DIFFERENTIAL (CANCER CENTER ONLY)
Abs Immature Granulocytes: 0.01 10*3/uL (ref 0.00–0.07)
Basophils Absolute: 0.1 10*3/uL (ref 0.0–0.1)
Basophils Relative: 2 %
Eosinophils Absolute: 0.9 10*3/uL — ABNORMAL HIGH (ref 0.0–0.5)
Eosinophils Relative: 11 %
HCT: 46.9 % (ref 39.0–52.0)
Hemoglobin: 15.9 g/dL (ref 13.0–17.0)
Immature Granulocytes: 0 %
Lymphocytes Relative: 23 %
Lymphs Abs: 2 10*3/uL (ref 0.7–4.0)
MCH: 31.4 pg (ref 26.0–34.0)
MCHC: 33.9 g/dL (ref 30.0–36.0)
MCV: 92.7 fL (ref 80.0–100.0)
Monocytes Absolute: 0.5 10*3/uL (ref 0.1–1.0)
Monocytes Relative: 6 %
Neutro Abs: 4.9 10*3/uL (ref 1.7–7.7)
Neutrophils Relative %: 58 %
Platelet Count: 226 10*3/uL (ref 150–400)
RBC: 5.06 MIL/uL (ref 4.22–5.81)
RDW: 12.3 % (ref 11.5–15.5)
WBC Count: 8.5 10*3/uL (ref 4.0–10.5)
nRBC: 0 % (ref 0.0–0.2)

## 2022-05-31 LAB — TSH: TSH: 2.272 u[IU]/mL (ref 0.350–4.500)

## 2022-05-31 MED ORDER — SODIUM CHLORIDE 0.9 % IV SOLN
200.0000 mg | Freq: Once | INTRAVENOUS | Status: AC
  Administered 2022-05-31: 200 mg via INTRAVENOUS
  Filled 2022-05-31: qty 200

## 2022-05-31 MED ORDER — SODIUM CHLORIDE 0.9 % IV SOLN: Freq: Once | INTRAVENOUS | Status: AC

## 2022-05-31 NOTE — Patient Instructions (Signed)
[facility]ICAL [facility]COLOG[facility]   Discharge Instructions: Thank you for choosing [facility] to provide your oncology and hematology care.   If you have a lab appointment with the Cancer Center, please go directly to the Cancer Center and check in at the registration area.   Wear comfortable clothing and clothing appropriate for easy access to any Portacath or PICC line.   We strive to give you quality time with your provider. You may need to reschedule your appointment if you arrive late (15 or more minutes).  Arriving late affects you and other patients whose appointments are after yours.  Also, if you miss three or more appointments without notifying the office, you may be dismissed from the clinic at the provider's discretion.      For prescription refill requests, have your pharmacy contact our office and allow 72 hours for refills to be completed.    Today you received the following chemotherapy and/or immunotherapy agents: pembrolizumab      To help prevent nausea and vomiting after your treatment, we encourage you to take your nausea medication as directed.  BELOW ARE SYMPTOMS THAT SHOULD BE REPORTED IMMEDIATELY: *FEVER GREATER THAN 100.4 F (38 C) OR HIGHER *CHILLS OR SWEATING *NAUSEA AND VOMITING THAT IS NOT CONTROLLED WITH YOUR NAUSEA MEDICATION *UNUSUAL SHORTNESS OF BREATH *UNUSUAL BRUISING OR BLEEDING *URINARY PROBLEMS (pain or burning when urinating, or frequent urination) *BOWEL PROBLEMS (unusual diarrhea, constipation, pain near the anus) TENDERNESS IN MOUTH AND THROAT WITH OR WITHOUT PRESENCE OF ULCERS (sore throat, sores in mouth, or a toothache) UNUSUAL RASH, SWELLING OR PAIN  UNUSUAL VAGINAL DISCHARGE OR ITCHING   Items with * indicate a potential emergency and should be followed up as soon as possible or go to the Emergency Department if any problems should occur.  Please show the CHEMOTHERAPY ALERT CARD or IMMUNOTHERAPY ALERT CARD at  check-in to the Emergency Department and triage nurse.  Should you have questions after your visit or need to cancel or reschedule your appointment, please contact [facility]  Dept: [phone]  and follow the prompts.  Office hours are 8:00 a.m. to 4:30 p.m. Monday - Friday. Please note that voicemails left after 4:00 p.m. may not be returned until the following business day.  We are closed weekends and major holidays. You have access to a nurse at all times for urgent questions. Please call the main number to the clinic Dept: [phone] and follow the prompts.   For any non-urgent questions, you may also contact your provider using MyChart. We now offer e-Visits for anyone 18 and older to request care online for non-urgent symptoms. For details visit [email or web address].   Also download the MyChart app! Go to the app store, search "MyChart", open the app, select [facility], and log in with your MyChart username and password.  Masks are optional in the cancer centers. If you would like for your care team to wear a mask while they are taking care of you, please let them know. You may have one support Farhiya Rosten who is at least 55 years old accompany you for your appointments. 

## 2022-05-31 NOTE — Progress Notes (Signed)
Hematology and Oncology Follow Up Visit  Russell Burns [id number] Feb 20, 1966 56 y.o. 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  He was found to have T3a tumor after radical nephrectomy.   Prior Therapy: He is status post left laparoscopic radical nephrectomy completed on 11/12/2021 under the care of Dr. Gloriann Loan.  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    Current therapy: Pembrolizumab 200 mg every 3 weeks started on May 10, 2022.  He is here for cycle 2.  Interim History: Mr. Russell Burns returns today for a follow-up visit.  Since last visit, he received the first cycle of Pembro without any major complications.  He denies any diarrhea or abdominal pain.  He did have nausea and Compazine has been helpful.  He denies any skin rashes or lesions.  He denies any pruritus.     Medications: I have reviewed the patient's current medications.  Current Outpatient Medications  Medication Sig Dispense Refill   albuterol (VENTOLIN HFA) 108 (90 Base) MCG/ACT inhaler Inhale 1 puff into the lungs every 4 (four) hours as needed for wheezing or shortness of breath.     Alirocumab (PRALUENT) 150 MG/ML SOAJ Inject 150 mLs into the skin every 14 (fourteen) days. 10th and 25th of each month     amLODipine (NORVASC) 10 MG tablet Take 10 mg by mouth daily.     ANORO ELLIPTA 62.5-25 MCG/ACT AEPB Inhale 1 puff into the lungs daily.     atorvastatin (LIPITOR) 80 MG tablet Take 80 mg by mouth daily.     cetirizine (ZYRTEC) 10 MG tablet Take 10 mg by mouth 2 (two) times daily.     docusate sodium (COLACE) 100 MG capsule Take 1 capsule (100 mg total) by mouth 2 (two) times daily.     EPINEPHrine 0.3 mg/0.3 mL IJ SOAJ injection Inject 0.3 mg into the muscle as needed for anaphylaxis.     ezetimibe  (ZETIA) 10 MG tablet Take 10 mg by mouth daily.     JARDIANCE 25 MG TABS tablet Take 25 mg by mouth daily.     losartan (COZAAR) 100 MG tablet Take 100 mg by mouth daily.     metFORMIN (GLUCOPHAGE) 1000 MG tablet Take 1,000 mg by mouth 2 (two) times daily with a meal.     oxyCODONE (OXY IR/ROXICODONE) 5 MG immediate release tablet Take 1-2 tablets (5-10 mg total) by mouth every 6 (six) hours as needed for severe pain or moderate pain. 20 tablet 0   prochlorperazine (COMPAZINE) 10 MG tablet Take 1 tablet (10 mg total) by mouth every 6 (six) hours as needed for nausea or vomiting. 30 tablet 0   tamsulosin (FLOMAX) 0.4 MG CAPS capsule Take 1 capsule (0.4 mg total) by mouth daily. 14 capsule 0   tirzepatide (MOUNJARO) 10 MG/0.5ML Pen Inject 10 mg into the skin once a week. 6 mL 3   No current facility-administered medications for this visit.     Allergies:  Allergies  Allergen Reactions   Bee Venom Anaphylaxis     Physical Exam: Blood pressure (!) 143/86, pulse 88, temperature 97.8 F (36.6 C), temperature source Temporal, resp. rate 18, height '5\' 10"'$  (1.778 m), weight 239 lb 6.4 oz (108.6 kg), SpO2 98 %. ECOG: 0   General appearance: Comfortable appearing without any discomfort Head:  Normocephalic without any trauma Oropharynx: Mucous membranes are moist and pink without any thrush or ulcers. Eyes: Pupils are equal and round reactive to light. Lymph nodes: No cervical, supraclavicular, inguinal or axillary lymphadenopathy.   Heart:regular rate and rhythm.  S1 and S2 without leg edema. Lung: Clear without any rhonchi or wheezes.  No dullness to percussion. Abdomin: Soft, nontender, nondistended with good bowel sounds.  No hepatosplenomegaly. Musculoskeletal: No joint deformity or effusion.  Full range of motion noted. Neurological: No deficits noted on motor, sensory and deep tendon reflex exam. Skin: No petechial rash or dryness.  Appeared moist.  Psychiatric: Mood and affect  appeared appropriate.     Lab Results: Lab Results  Component Value Date   WBC 8.5 05/31/2022   HGB 15.9 05/31/2022   HCT 46.9 05/31/2022   MCV 92.7 05/31/2022   PLT 226 05/31/2022     Chemistry      Component Value Date/Time   NA 138 05/10/2022 1359   NA 140 10/13/2021 1309   K 4.4 05/10/2022 1359   CL 105 05/10/2022 1359   CO2 22 05/10/2022 1359   BUN 27 (H) 05/10/2022 1359   BUN 14 10/13/2021 1309   CREATININE 1.37 (H) 05/10/2022 1359      Component Value Date/Time   CALCIUM 9.4 05/10/2022 1359   ALKPHOS 61 05/10/2022 1359   AST 21 05/10/2022 1359   ALT 16 05/10/2022 1359   BILITOT 0.3 05/10/2022 1359         Impression and Plan:  56 year old with:  1.  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  He underwent radical nephrectomy without any evidence of metastatic disease in June 2023.    He is currently receiving adjuvant Pembrolizumab given his high risk disease and is tolerated the first cycle reasonably well.  Risks and benefits of continuing this treatment were discussed.  Complications that include immune mediated issues as well as GI toxicity were discussed.  He is agreeable to proceed and the plan to complete 17 cycles of therapy.      2.  Antiemetics: Compazine has been successful in controlling his nausea.  Recommended continuing for the time being.  3.  IV access: No issues with peripheral veins at this time.  Port-A-Cath option has been deferred.  4.  Immune mediated issues: I continue to educate him about these complications including pneumonitis, colitis and thyroid disease.   5.  Follow-up: In 3 weeks for the next cycle of therapy.   30  minutes were spent on this encounter.  The time was dedicated to reviewing disease status, treatment choices and addressing complications related to his cancer and cancer therapy.    Zola Button, MD 9/7/20239:32 AM

## 2022-06-05 ENCOUNTER — Telehealth: Payer: Self-pay | Admitting: Oncology

## 2022-06-05 DIAGNOSIS — Z79899 Other long term (current) drug therapy: Secondary | ICD-10-CM | POA: Diagnosis not present

## 2022-06-05 DIAGNOSIS — E119 Type 2 diabetes mellitus without complications: Secondary | ICD-10-CM | POA: Diagnosis not present

## 2022-06-05 NOTE — Telephone Encounter (Signed)
cheduled per 09/07 los, patient has been called and notified.

## 2022-06-11 ENCOUNTER — Ambulatory Visit (INDEPENDENT_AMBULATORY_CARE_PROVIDER_SITE_OTHER): Payer: BC Managed Care – PPO

## 2022-06-11 ENCOUNTER — Ambulatory Visit (INDEPENDENT_AMBULATORY_CARE_PROVIDER_SITE_OTHER): Payer: BC Managed Care – PPO | Admitting: Internal Medicine

## 2022-06-11 ENCOUNTER — Encounter: Payer: Self-pay | Admitting: Internal Medicine

## 2022-06-11 DIAGNOSIS — R0609 Other forms of dyspnea: Secondary | ICD-10-CM | POA: Diagnosis not present

## 2022-06-11 DIAGNOSIS — R053 Chronic cough: Secondary | ICD-10-CM | POA: Diagnosis not present

## 2022-06-11 DIAGNOSIS — R0602 Shortness of breath: Secondary | ICD-10-CM | POA: Diagnosis not present

## 2022-06-11 DIAGNOSIS — R059 Cough, unspecified: Secondary | ICD-10-CM | POA: Diagnosis not present

## 2022-06-11 MED ORDER — FAMOTIDINE 20 MG PO TABS: ORAL_TABLET | ORAL | 11 refills | Status: DC

## 2022-06-11 MED ORDER — PANTOPRAZOLE SODIUM 40 MG PO TBEC: 40.0000 mg | DELAYED_RELEASE_TABLET | Freq: Every day | ORAL | 2 refills | Status: DC

## 2022-06-11 MED ORDER — PREDNISONE 10 MG PO TABS: ORAL_TABLET | ORAL | 0 refills | Status: DC

## 2022-06-11 MED ORDER — PROMETHAZINE-DM 6.25-15 MG/5ML PO SYRP: 5.0000 mL | ORAL_SOLUTION | Freq: Four times a day (QID) | ORAL | 0 refills | Status: DC | PRN

## 2022-06-11 NOTE — Progress Notes (Unsigned)
Russell Burns, male    DOB: 08/14/1966   MRN: [id number]   Brief patient profile:  35   yowm never smoker seasonal rhinitis spring > fall rx drixoral  referred to pulmonary clinic 06/11/2022 by Russell Burns for sob  Has had good ex tolerance / very active up and down steps then 1st covid Dec 2020 got infusion sick 3 weeks but back to baseline then 2nd infection just sniffles oct 2022 then Jan 2023 severe coughing fits with sob mostly daytime then April  2023 dx renal cell  cancer cough syncope   > admit     Admit date: 01/01/2022 Discharge date: 01/07/2022   Time spent: 50 minutes   Recommendations for Outpatient Follow-up:      Acute respiratory failure with hypoxia (Russell Burns) -Presented with hypoxia down to 88 to 89% requiring 3 L via nasal cannula.  He reports about 4 months of symptoms of nonproductive cough with exertional dyspnea and orthopnea.  CTA of the chest is negative for pulmonary embolism but showed findings suggestive of small airway disease with mucous plugging of the lower lobes.  CTA chest in January  showed airway debris bilaterally concerning for bronchitis versus aspiration.  He has no formal diagnosis of COPD and only has remote history of tobacco use 20 years ago,but reports -maternal uncle with COPD due to alpha antitrypsin deficiency. -4/11 Alpha-1 antitrypsin pending -Unclear if this is really COPD. he will need outpatient PFT   -DuoNeb QID -Trial of nebulized hypertonic saline and inhaled steroid for mucolytic.  -4/11 chest physiotherapy TID.  -daily IV Solu-Medrol '60mg'$  -4/12 resolved: DC Solu-Medrol - 4/12 Prednisone 50 mg x 5 days    Left Renal mass/suspicious cyst of the upper pole RIGHT kidney -4/11 review of CT scanning of abdomen 4/4 shows mass in LEFT kidney consistent with malignancy, and suspicious mass in right kidney. - 4/12 discussed case with [facility] stated equipment required to remove kidney not in hospital.  Patient to follow-up upon  discharge, to discuss  LEFT kidney resection -Follow-up with urologist ASAP upon discharge   HLD (hyperlipidemia) Continue home meds once med rec is complete   Thoracic aortic aneurysm (Russell Burns) -Stable 4.6 cm ascending aortic aneurysm.  See CTA PE protocol below -4/11 patient never seen cardiothoracic surgery, and has SIGNIFICANT family history of ruptured AAA in his family. - 4/12 discussed case with Russell Burns Cardiothoracic Surgery. patient is to follow-up with him in 6 months.     Chronic diastolic CHF/Moderate AS -Echo in 09/2021 with EF of 55 to 60% and grade 1 diastolic dysfunction. - If symptoms do not improve with pulmonary therapy, could consider consult to cardiology to discuss whether symptoms are related to his moderate aortic stenosis.     Essential hypertension -BP goal <120/80 if patient can tolerate, secondary to AAA. - Amlodipine 10 mg daily - Losartan 100 mg daily -Hydralazine PRN SBP> 130 or DBP> 90 -4/12 BP fairly well controlled on current medication   Diabetes mellitus without complication (HCC) - Discharged on home medication   Obese (BMI 34.4 kg/m) -Discuss weight loss strategies with PCP     Discharge Diagnoses:  Principal Problem:   Acute respiratory failure with hypoxia ([place]) Active Problems:   Diabetes mellitus without complication ([place])   Essential hypertension   Thoracic aortic aneurysm (HCC)   HLD (hyperlipidemia)   Left renal mass   Chronic diastolic CHF (congestive heart failure) ([place])      History of Present Illness  06/11/2022  Pulmonary/  1st office eval/Russell Burns  was 80% better p admit until a week p 1st use of Keytruda which was on 05/10/22  Chief Complaint  Patient presents with   Consult    Referred by PCP for cough and increased SOB. States this has been going on since January 2023. Was in the hospital back in April 2023 for the same concern.   Dyspnea:  MMRC3 = can't walk 100 yards even at a slow pace at a flat grade s stopping  due to sob   Cough: worse at hs dry with subjective wheeze  Sleep: bolt upright x 2 weeks  SABA use: anoro daily/ saba not really sure it's helping   No obvious day to day or daytime pattern/variability or assoc excess/ purulent sputum or mucus plugs or hemoptysis or cp or chest tightness, subjective wheeze or overt sinus or hb symptoms.     Also denies any obvious fluctuation of symptoms with weather or environmental changes or other aggravating or alleviating factors except as outlined above   No unusual exposure hx or h/o childhood pna/ asthma or knowledge of premature birth.  Current Allergies, Complete Past Medical History, Past Surgical History, Family History, and Social History were reviewed in [employer] record.  ROS  The following are not active complaints unless bolded Hoarseness, sore throat, dysphagia, dental problems, itching, sneezing,  nasal congestion or discharge of excess mucus or purulent secretions, ear ache,   fever, chills, sweats, unintended wt loss or wt gain, classically pleuritic or exertional cp,  orthopnea pnd or arm/hand swelling  or leg swelling, presyncope, palpitations, abdominal pain, anorexia, nausea, vomiting, diarrhea  or change in bowel habits or change in bladder habits, change in stools or change in urine, dysuria, hematuria,  rash, arthralgias, visual complaints, headache, numbness, weakness or ataxia or problems with walking or coordination,  change in mood or  memory.           Past Medical History:  Diagnosis Date   Allergy    Aortic stenosis    Arthritis    Blood transfusion without reported diagnosis    Cancer ([place])    Chronic kidney disease    Diabetes mellitus without complication (HCC)    FH: thoracic aneurysm    Hyperlipidemia    Hypertension    Obesity     Outpatient Medications Prior to Visit  Medication Sig Dispense Refill   albuterol (VENTOLIN HFA) 108 (90 Base) MCG/ACT inhaler Inhale 1 puff into the  lungs every 4 (four) hours as needed for wheezing or shortness of breath.     amLODipine (NORVASC) 10 MG tablet Take 10 mg by mouth daily.     atorvastatin (LIPITOR) 80 MG tablet Take 80 mg by mouth daily.     docusate sodium (COLACE) 100 MG capsule Take 1 capsule (100 mg total) by mouth 2 (two) times daily.     EPINEPHrine 0.3 mg/0.3 mL IJ SOAJ injection Inject 0.3 mg into the muscle as needed for anaphylaxis.     ezetimibe (ZETIA) 10 MG tablet Take 10 mg by mouth daily.     JARDIANCE 25 MG TABS tablet Take 25 mg by mouth daily.     losartan (COZAAR) 100 MG tablet Take 100 mg by mouth daily.     metFORMIN (GLUCOPHAGE) 1000 MG tablet Take 1,000 mg by mouth 2 (two) times daily with a meal.     oxyCODONE (OXY IR/ROXICODONE) 5 MG immediate release tablet Take 1-2 tablets (5-10 mg total) by mouth every 6 (  six) hours as needed for severe pain or moderate pain. 20 tablet 0   prochlorperazine (COMPAZINE) 10 MG tablet Take 1 tablet (10 mg total) by mouth every 6 (six) hours as needed for nausea or vomiting. 30 tablet 0   PROMETHAZINE HCL PO Take by mouth.     tamsulosin (FLOMAX) 0.4 MG CAPS capsule Take 1 capsule (0.4 mg total) by mouth daily. 14 capsule 0   tirzepatide (MOUNJARO) 10 MG/0.5ML Pen Inject 10 mg into the skin once a week. 6 mL 3   ANORO ELLIPTA 62.5-25 MCG/ACT AEPB Inhale 1 puff into the lungs daily.     cetirizine (ZYRTEC) 10 MG tablet Take 10 mg by mouth 2 (two) times daily.     Alirocumab (PRALUENT) 150 MG/ML SOAJ Inject 150 mLs into the skin every 14 (fourteen) days. 10th and 25th of each month     No facility-administered medications prior to visit.     Objective:     BP 126/84   Pulse 83   Ht '5\' 10"'$  (1.778 m)   Wt 235 lb 6.4 oz (106.8 kg)   SpO2 94% Comment: on RA  BMI 33.78 kg/m   SpO2: 94 % (on RA)  Wt Readings from Last 3 Encounters:  06/11/22 235 lb 6.4 oz (106.8 kg)  05/31/22 239 lb 6.4 oz (108.6 kg)  05/10/22 239 lb (108.4 kg)      Amb wm sob at rest     HEENT : Oropharynx  clear/mild thrus      Nasal turbinates nl    NECK :  without  apparent JVD/ palpable Nodes/TM    LUNGS: no acc muscle use,  Nl contour chest with insp /exp rhonchi bilaterally    CV:  RRR  no s3 or murmur or increase in P2, and no edema   ABD:  soft and nontender with nl inspiratory excursion in the supine position. No bruits or organomegaly appreciated   MS:  Nl gait/ ext warm without deformities Or obvious joint restrictions  calf tenderness, cyanosis or clubbing    SKIN: warm and dry without lesions    NEURO:  alert, approp, nl sensorium with  no motor or cerebellar deficits apparent.   Labs ordered/ reviewed:      Chemistry      Component Value Date/Time   NA 136 06/11/2022 1600   NA 140 10/13/2021 1309   K 4.4 06/11/2022 1600   CL 101 06/11/2022 1600   CO2 26 06/11/2022 1600   BUN 16 06/11/2022 1600   BUN 14 10/13/2021 1309   CREATININE 1.26 06/11/2022 1600   CREATININE 1.35 (H) 05/31/2022 0902      Component Value Date/Time   CALCIUM 9.7 06/11/2022 1600   ALKPHOS 60 05/31/2022 0902   AST 14 (L) 05/31/2022 0902   ALT 19 05/31/2022 0902   BILITOT 0.3 05/31/2022 0902        Lab Results  Component Value Date   WBC 8.0 06/11/2022   HGB 16.6 06/11/2022   HCT 49.6 06/11/2022   MCV 94.5 06/11/2022   PLT 255.0 06/11/2022       EOS                                                              1.7  06/11/2022      Lab Results  Component Value Date   TSH 2.272 05/31/2022     Lab Results  Component Value Date   PROBNP 43.0 06/11/2022       Lab Results  Component Value Date   ESRSEDRATE 26 (H) 06/11/2022    CXR PA and Lateral:   06/11/2022 :    I personally reviewed images and impression is as follows:     Increased markings, non-specific      Assessment   Chronic cough Onset p viral syndrome Jan 2023 with neg covid 19 testing   Of the three most common causes of  Sub-acute / recurrent or chronic  cough, only one (GERD)  can actually contribute to/ trigger  the other two (asthma and post nasal drip syndrome)  and perpetuate the cylce of cough.  While not intuitively obvious, many patients with chronic low grade reflux do not cough until there is a primary insult that disturbs the protective epithelial barrier and exposes sensitive nerve endings.   This is typically viral but can due to PNDS and  Former probably applies here, at least re trigger initially.   >>>    The point is that once this occurs, it is difficult to eliminate the cycle  using anything but a maximally effective acid suppression regimen at least in the short run, accompanied by an appropriate diet to address non acid GERD and control / eliminate the cough itself for at least 3 days with phenergan dm and oxycodone and 1st gen H1 blockers per guidelines  For any pnds lingering >>> also so added 6 day taper off  Prednisone starting at 40 mg per day in case of component of Th-2 driven upper or lower airways inflammation (if cough responds short term only to relapse before return while will on full rx for uacs (as above), then  that would point to allergic rhinitis/ asthma or eos bronchitis as alternative dx)    DOE (dyspnea on exertion) Onset around mid May 17 2022 p Ketruda started 05/10/22 > d/c'd  05/31/22 last dose  - 06/11/2022   Walked on RA  x  2  lap(s) =  approx 500  ft  @ nl pace, stopped due to sob/desats to 87%  But quickly recovered at rest  - Eos 1.7 06/11/2022 > rx short course prednisone only as pt is diabetic  The goal with a chronic steroid dependent illness is always arriving at the lowest effective dose that controls the disease/symptoms and not accepting a set "formula" which is based on statistics or guidelines that don't always take into account patient  variability or the natural hx of the dz in every individual patient, which may well vary over time.  For now therefore I recommend the patient just try off the drug  and on short course prednisone as he is diabetic and if not improving may need to consider inpt rx with higher doses requiring freq insulin SS   Discussed with  Russell Alen Blew as well and he agrees.        Each maintenance medication was reviewed in detail including emphasizing most importantly the difference between maintenance and prns and under what circumstances the prns are to be triggered using an action plan format where appropriate.  Total time for H and P, chart review, counseling, reviewing hfa/neb device(s) , directly observing portions of ambulatory 02 saturation study/ and generating customized AVS unique to this office visit / same day charting > 45  min new pt eval                        Christinia Gully, MD 06/12/2022

## 2022-06-11 NOTE — Patient Instructions (Addendum)
Prednisone 10 mg Take 4 for three days 3 for three days 2 for three days 1 for three days and stop   For cough > Promethazine DM 2 tsp four times daily and supplement with oxycodone 5 mg every 4 hours as needed   For tickle >  For drainage / throat tickle try take CHLORPHENIRAMINE  4 mg  ("Allergy Relief" '4mg'$   at [facility] should be easiest to find in the blue box usually on bottom shelf)  take one every 4 hours as needed - extremely effective and inexpensive over the counter- may cause drowsiness so start with just a dose or two an hour before bedtime and see how you tolerate it before trying in daytime.   Prilosec  (20 x 2) 40 mg  (Pantoprazole 40 mg)   Take  30-60 min before first meal of the day and Pepcid (famotidine)  20 mg after supper until return to office - this is the best way to tell whether stomach acid is contributing to your problem.    GERD (REFLUX)  is an extremely common cause of respiratory symptoms just like yours , many times with no obvious heartburn at all.    It can be treated with medication, but also with lifestyle changes including elevation of the head of your bed (ideally with 6 -8inch blocks under the headboard of your bed),  Smoking cessation, avoidance of late meals, excessive alcohol, and avoid fatty foods, chocolate, peppermint, colas, red wine, and acidic juices such as orange juice.  NO MINT OR MENTHOL PRODUCTS SO NO COUGH DROPS  USE SUGARLESS CANDY INSTEAD (Jolley ranchers or Stover's or Life Savers) or even ice chips will also do - the key is to swallow to prevent all throat clearing. NO OIL BASED VITAMINS - use powdered substitutes.  Avoid fish oil when coughing.   We will walk you today at normal pace  No more [place] or anoro  Ok to use albuterol up to 2 puffs every 4 hours if you feel it helps your breathing or cough   Please remember to go to the  x-ray department  for your tests - we will call you with the results when they are available     Please remember to go to the lab department   for your tests - we will call you with the results when they are available.     Please schedule a follow up office visit in 2 weeks, sooner if needed- to [facility] ER in meantime if getting worse or 02 sats on RA < 90% at rest

## 2022-06-12 ENCOUNTER — Encounter: Payer: Self-pay | Admitting: Internal Medicine

## 2022-06-12 ENCOUNTER — Encounter: Payer: Self-pay | Admitting: Oncology

## 2022-06-12 LAB — BASIC METABOLIC PANEL
BUN: 16 mg/dL (ref 6–23)
CO2: 26 mEq/L (ref 19–32)
Calcium: 9.7 mg/dL (ref 8.4–10.5)
Chloride: 101 mEq/L (ref 96–112)
Creatinine, Ser: 1.26 mg/dL (ref 0.40–1.50)
GFR: 64.18 mL/min (ref >60.00)
Glucose, Bld: 111 mg/dL — ABNORMAL HIGH (ref 70–99)
Potassium: 4.4 mEq/L (ref 3.5–5.1)
Sodium: 136 mEq/L (ref 135–145)

## 2022-06-12 LAB — CBC WITH DIFFERENTIAL/PLATELET
Basophils Absolute: 0.1 10*3/uL (ref 0.0–0.1)
Basophils Relative: 1 % (ref 0.0–3.0)
Eosinophils Absolute: 1.7 10*3/uL — ABNORMAL HIGH (ref 0.0–0.7)
Eosinophils Relative: 21.8 % — ABNORMAL HIGH (ref 0.0–5.0)
HCT: 49.6 % (ref 39.0–52.0)
Hemoglobin: 16.6 g/dL (ref 13.0–17.0)
Lymphocytes Relative: 24.6 % (ref 12.0–46.0)
Lymphs Abs: 2 10*3/uL (ref 0.7–4.0)
MCHC: 33.4 g/dL (ref 30.0–36.0)
MCV: 94.5 fl (ref 78.0–100.0)
Monocytes Absolute: 0.6 10*3/uL (ref 0.1–1.0)
Monocytes Relative: 7 % (ref 3.0–12.0)
Neutro Abs: 3.7 10*3/uL (ref 1.4–7.7)
Neutrophils Relative %: 45.6 % (ref 43.0–77.0)
Platelets: 255 10*3/uL (ref 150.0–400.0)
RBC: 5.25 Mil/uL (ref 4.22–5.81)
RDW: 13.6 % (ref 11.5–15.5)
WBC: 8 10*3/uL (ref 4.0–10.5)

## 2022-06-12 LAB — SEDIMENTATION RATE: Sed Rate: 26 mm/hr — ABNORMAL HIGH (ref 0–20)

## 2022-06-12 LAB — BRAIN NATRIURETIC PEPTIDE: Pro B Natriuretic peptide (BNP): 43 pg/mL (ref 0.0–100.0)

## 2022-06-12 NOTE — Assessment & Plan Note (Signed)
Onset around mid May 17 2022 p Ketruda started 05/10/22 > d/c'd  05/31/22 last dose  - 06/11/2022   Walked on RA  x  2  lap(s) =  approx 500  ft  @ nl pace, stopped due to sob/desats to 87%  But quickly recovered at rest  - Eos 1.7 06/11/2022 > rx short course prednisone only as pt is diabetic  The goal with a chronic steroid dependent illness is always arriving at the lowest effective dose that controls the disease/symptoms and not accepting a set "formula" which is based on statistics or guidelines that don't always take into account patient  variability or the natural hx of the dz in every individual patient, which may well vary over time.  For now therefore I recommend the patient just try off the drug and on short course prednisone as he is diabetic and if not improving may need to consider inpt rx with higher doses requiring freq insulin SS   Discussed with  Dr Alen Blew as well and he agrees.        Each maintenance medication was reviewed in detail including emphasizing most importantly the difference between maintenance and prns and under what circumstances the prns are to be triggered using an action plan format where appropriate.  Total time for H and P, chart review, counseling, reviewing hfa/neb device(s) , directly observing portions of ambulatory 02 saturation study/ and generating customized AVS unique to this office visit / same day charting > 45 min new pt eval

## 2022-06-12 NOTE — Assessment & Plan Note (Signed)
Onset p viral syndrome Jan 2023 with neg covid 19 testing   Of the three most common causes of  Sub-acute / recurrent or chronic cough, only one (GERD)  can actually contribute to/ trigger  the other two (asthma and post nasal drip syndrome)  and perpetuate the cylce of cough.  While not intuitively obvious, many patients with chronic low grade reflux do not cough until there is a primary insult that disturbs the protective epithelial barrier and exposes sensitive nerve endings.   This is typically viral but can due to PNDS and  Former probably applies here, at least re trigger initially.   >>>    The point is that once this occurs, it is difficult to eliminate the cycle  using anything but a maximally effective acid suppression regimen at least in the short run, accompanied by an appropriate diet to address non acid GERD and control / eliminate the cough itself for at least 3 days with phenergan dm and oxycodone and 1st gen H1 blockers per guidelines  For any pnds lingering >>> also so added 6 day taper off  Prednisone starting at 40 mg per day in case of component of Th-2 driven upper or lower airways inflammation (if cough responds short term only to relapse before return while will on full rx for uacs (as above), then  that would point to allergic rhinitis/ asthma or eos bronchitis as alternative dx)

## 2022-06-14 ENCOUNTER — Other Ambulatory Visit: Payer: Self-pay

## 2022-06-14 ENCOUNTER — Inpatient Hospital Stay (HOSPITAL_COMMUNITY)
Admission: EM | Admit: 2022-06-14 | Discharge: 2022-06-19 | DRG: 189 | Disposition: A | Discharge status: Home / Self Care | Payer: BC Managed Care – PPO | Attending: Internal Medicine | Admitting: Internal Medicine

## 2022-06-14 ENCOUNTER — Emergency Department (HOSPITAL_COMMUNITY): Payer: BC Managed Care – PPO

## 2022-06-14 ENCOUNTER — Encounter (HOSPITAL_COMMUNITY): Payer: Self-pay

## 2022-06-14 DIAGNOSIS — I7121 Aneurysm of the ascending aorta, without rupture: Secondary | ICD-10-CM | POA: Diagnosis not present

## 2022-06-14 DIAGNOSIS — I152 Hypertension secondary to endocrine disorders: Secondary | ICD-10-CM | POA: Diagnosis present

## 2022-06-14 DIAGNOSIS — Z905 Acquired absence of kidney: Secondary | ICD-10-CM | POA: Diagnosis not present

## 2022-06-14 DIAGNOSIS — E119 Type 2 diabetes mellitus without complications: Secondary | ICD-10-CM | POA: Diagnosis not present

## 2022-06-14 DIAGNOSIS — E1159 Type 2 diabetes mellitus with other circulatory complications: Secondary | ICD-10-CM | POA: Diagnosis present

## 2022-06-14 DIAGNOSIS — E669 Obesity, unspecified: Secondary | ICD-10-CM | POA: Diagnosis present

## 2022-06-14 DIAGNOSIS — I10 Essential (primary) hypertension: Secondary | ICD-10-CM | POA: Diagnosis present

## 2022-06-14 DIAGNOSIS — R0602 Shortness of breath: Secondary | ICD-10-CM | POA: Diagnosis not present

## 2022-06-14 DIAGNOSIS — I712 Thoracic aortic aneurysm, without rupture, unspecified: Secondary | ICD-10-CM | POA: Diagnosis present

## 2022-06-14 DIAGNOSIS — F17220 Nicotine dependence, chewing tobacco, uncomplicated: Secondary | ICD-10-CM | POA: Diagnosis not present

## 2022-06-14 DIAGNOSIS — J449 Chronic obstructive pulmonary disease, unspecified: Secondary | ICD-10-CM | POA: Diagnosis present

## 2022-06-14 DIAGNOSIS — I5032 Chronic diastolic (congestive) heart failure: Secondary | ICD-10-CM | POA: Diagnosis present

## 2022-06-14 DIAGNOSIS — Z794 Long term (current) use of insulin: Secondary | ICD-10-CM | POA: Diagnosis not present

## 2022-06-14 DIAGNOSIS — Z79899 Other long term (current) drug therapy: Secondary | ICD-10-CM

## 2022-06-14 DIAGNOSIS — C642 Malignant neoplasm of left kidney, except renal pelvis: Secondary | ICD-10-CM | POA: Diagnosis not present

## 2022-06-14 DIAGNOSIS — Z9221 Personal history of antineoplastic chemotherapy: Secondary | ICD-10-CM

## 2022-06-14 DIAGNOSIS — J9601 Acute respiratory failure with hypoxia: Principal | ICD-10-CM

## 2022-06-14 DIAGNOSIS — J9621 Acute and chronic respiratory failure with hypoxia: Secondary | ICD-10-CM | POA: Diagnosis present

## 2022-06-14 DIAGNOSIS — Z7984 Long term (current) use of oral hypoglycemic drugs: Secondary | ICD-10-CM | POA: Diagnosis not present

## 2022-06-14 DIAGNOSIS — Z20822 Contact with and (suspected) exposure to covid-19: Secondary | ICD-10-CM | POA: Diagnosis present

## 2022-06-14 DIAGNOSIS — E1169 Type 2 diabetes mellitus with other specified complication: Secondary | ICD-10-CM | POA: Diagnosis present

## 2022-06-14 DIAGNOSIS — E7800 Pure hypercholesterolemia, unspecified: Secondary | ICD-10-CM | POA: Diagnosis not present

## 2022-06-14 DIAGNOSIS — I110 Hypertensive heart disease with heart failure: Secondary | ICD-10-CM | POA: Diagnosis present

## 2022-06-14 DIAGNOSIS — Z6833 Body mass index (BMI) 33.0-33.9, adult: Secondary | ICD-10-CM

## 2022-06-14 DIAGNOSIS — E785 Hyperlipidemia, unspecified: Secondary | ICD-10-CM | POA: Diagnosis not present

## 2022-06-14 DIAGNOSIS — R0902 Hypoxemia: Secondary | ICD-10-CM | POA: Diagnosis present

## 2022-06-14 DIAGNOSIS — J189 Pneumonia, unspecified organism: Secondary | ICD-10-CM | POA: Diagnosis not present

## 2022-06-14 DIAGNOSIS — Z833 Family history of diabetes mellitus: Secondary | ICD-10-CM | POA: Diagnosis not present

## 2022-06-14 DIAGNOSIS — K219 Gastro-esophageal reflux disease without esophagitis: Secondary | ICD-10-CM | POA: Diagnosis not present

## 2022-06-14 DIAGNOSIS — Z8249 Family history of ischemic heart disease and other diseases of the circulatory system: Secondary | ICD-10-CM | POA: Diagnosis not present

## 2022-06-14 LAB — RESPIRATORY PANEL BY PCR

## 2022-06-14 LAB — TROPONIN I (HIGH SENSITIVITY)
Troponin I (High Sensitivity): 15 ng/L (ref <18)
Troponin I (High Sensitivity): 20 ng/L — ABNORMAL HIGH (ref <18)

## 2022-06-14 LAB — COMPREHENSIVE METABOLIC PANEL
ALT: 25 U/L (ref 0–44)
AST: 23 U/L (ref 15–41)
Albumin: 4.5 g/dL (ref 3.5–5.0)
Alkaline Phosphatase: 67 U/L (ref 38–126)
Anion gap: 10 (ref 5–15)
BUN: 21 mg/dL — ABNORMAL HIGH (ref 6–20)
CO2: 25 mmol/L (ref 22–32)
Calcium: 9.5 mg/dL (ref 8.9–10.3)
Chloride: 101 mmol/L (ref 98–111)
Creatinine, Ser: 1.21 mg/dL (ref 0.61–1.24)
GFR, Estimated: >60 mL/min (ref >60)
Glucose, Bld: 141 mg/dL — ABNORMAL HIGH (ref 70–99)
Potassium: 3.6 mmol/L (ref 3.5–5.1)
Sodium: 136 mmol/L (ref 135–145)
Total Bilirubin: 0.6 mg/dL (ref 0.3–1.2)
Total Protein: 8.1 g/dL (ref 6.5–8.1)

## 2022-06-14 LAB — CBC WITH DIFFERENTIAL/PLATELET
Abs Immature Granulocytes: 0.04 10*3/uL (ref 0.00–0.07)
Basophils Absolute: 0.1 10*3/uL (ref 0.0–0.1)
Basophils Relative: 1 %
Eosinophils Absolute: 0.1 10*3/uL (ref 0.0–0.5)
Eosinophils Relative: 1 %
HCT: 51.4 % (ref 39.0–52.0)
Hemoglobin: 17.3 g/dL — ABNORMAL HIGH (ref 13.0–17.0)
Immature Granulocytes: 0 %
Lymphocytes Relative: 26 %
Lymphs Abs: 2.6 10*3/uL (ref 0.7–4.0)
MCH: 31.3 pg (ref 26.0–34.0)
MCHC: 33.7 g/dL (ref 30.0–36.0)
MCV: 93.1 fL (ref 80.0–100.0)
Monocytes Absolute: 0.8 10*3/uL (ref 0.1–1.0)
Monocytes Relative: 8 %
Neutro Abs: 6.4 10*3/uL (ref 1.7–7.7)
Neutrophils Relative %: 64 %
Platelets: 327 10*3/uL (ref 150–400)
RBC: 5.52 MIL/uL (ref 4.22–5.81)
RDW: 12.4 % (ref 11.5–15.5)
WBC: 10.1 10*3/uL (ref 4.0–10.5)
nRBC: 0 % (ref 0.0–0.2)

## 2022-06-14 LAB — BRAIN NATRIURETIC PEPTIDE: B Natriuretic Peptide: 27.9 pg/mL (ref 0.0–100.0)

## 2022-06-14 LAB — PROTIME-INR
INR: 0.9 (ref 0.8–1.2)
Prothrombin Time: 12.2 seconds (ref 11.4–15.2)

## 2022-06-14 LAB — BLOOD GAS, VENOUS
Acid-Base Excess: 2.9 mmol/L — ABNORMAL HIGH (ref 0.0–2.0)
Bicarbonate: 27.9 mmol/L (ref 20.0–28.0)
O2 Saturation: 92.3 %
Patient temperature: 37
pCO2, Ven: 43 mmHg — ABNORMAL LOW (ref 44–60)
pH, Ven: 7.42 (ref 7.25–7.43)
pO2, Ven: 62 mmHg — ABNORMAL HIGH (ref 32–45)

## 2022-06-14 LAB — SARS CORONAVIRUS 2 BY RT PCR: SARS Coronavirus 2 by RT PCR: NEGATIVE

## 2022-06-14 LAB — PROCALCITONIN: Procalcitonin: <0.1 ng/mL

## 2022-06-14 LAB — GLUCOSE, CAPILLARY: Glucose-Capillary: 293 mg/dL — ABNORMAL HIGH (ref 70–99)

## 2022-06-14 MED ORDER — SODIUM CHLORIDE 0.9 % IV SOLN
500.0000 mg | Freq: Once | INTRAVENOUS | Status: AC
  Administered 2022-06-14: 500 mg via INTRAVENOUS
  Filled 2022-06-14: qty 5

## 2022-06-14 MED ORDER — IOHEXOL 350 MG/ML SOLN
80.0000 mL | Freq: Once | INTRAVENOUS | Status: AC | PRN
  Administered 2022-06-14: 80 mL via INTRAVENOUS

## 2022-06-14 MED ORDER — POLYETHYLENE GLYCOL 3350 17 G PO PACK: 17.0000 g | PACK | Freq: Every day | ORAL | Status: DC | PRN

## 2022-06-14 MED ORDER — ACETAMINOPHEN 650 MG RE SUPP: 650.0000 mg | Freq: Four times a day (QID) | RECTAL | Status: DC | PRN

## 2022-06-14 MED ORDER — LOSARTAN POTASSIUM 25 MG PO TABS
100.0000 mg | ORAL_TABLET | Freq: Every day | ORAL | Status: DC
  Administered 2022-06-15 – 2022-06-19 (×5): 100 mg via ORAL
  Filled 2022-06-14 (×5): qty 4

## 2022-06-14 MED ORDER — IPRATROPIUM-ALBUTEROL 0.5-2.5 (3) MG/3ML IN SOLN
3.0000 mL | Freq: Once | RESPIRATORY_TRACT | Status: AC
  Administered 2022-06-14: 3 mL via RESPIRATORY_TRACT
  Filled 2022-06-14: qty 3

## 2022-06-14 MED ORDER — ACETAMINOPHEN 325 MG PO TABS: 650.0000 mg | ORAL_TABLET | Freq: Four times a day (QID) | ORAL | Status: DC | PRN

## 2022-06-14 MED ORDER — ENOXAPARIN SODIUM 40 MG/0.4ML IJ SOSY
40.0000 mg | PREFILLED_SYRINGE | INTRAMUSCULAR | Status: DC
  Administered 2022-06-14 – 2022-06-18 (×5): 40 mg via SUBCUTANEOUS
  Filled 2022-06-14 (×5): qty 0.4

## 2022-06-14 MED ORDER — PANTOPRAZOLE SODIUM 40 MG PO TBEC
40.0000 mg | DELAYED_RELEASE_TABLET | Freq: Every day | ORAL | Status: DC
  Administered 2022-06-15 – 2022-06-19 (×5): 40 mg via ORAL
  Filled 2022-06-14 (×5): qty 1

## 2022-06-14 MED ORDER — EZETIMIBE 10 MG PO TABS
10.0000 mg | ORAL_TABLET | Freq: Every day | ORAL | Status: DC
  Administered 2022-06-15 – 2022-06-19 (×5): 10 mg via ORAL
  Filled 2022-06-14 (×5): qty 1

## 2022-06-14 MED ORDER — IPRATROPIUM-ALBUTEROL 0.5-2.5 (3) MG/3ML IN SOLN
3.0000 mL | Freq: Four times a day (QID) | RESPIRATORY_TRACT | Status: DC
  Administered 2022-06-14 – 2022-06-17 (×10): 3 mL via RESPIRATORY_TRACT
  Filled 2022-06-14 (×11): qty 3

## 2022-06-14 MED ORDER — AMLODIPINE BESYLATE 5 MG PO TABS
10.0000 mg | ORAL_TABLET | Freq: Every day | ORAL | Status: DC
  Administered 2022-06-15 – 2022-06-19 (×5): 10 mg via ORAL
  Filled 2022-06-14 (×5): qty 2

## 2022-06-14 MED ORDER — TAMSULOSIN HCL 0.4 MG PO CAPS
0.4000 mg | ORAL_CAPSULE | Freq: Every day | ORAL | Status: DC
  Administered 2022-06-15 – 2022-06-19 (×5): 0.4 mg via ORAL
  Filled 2022-06-14 (×5): qty 1

## 2022-06-14 MED ORDER — SODIUM CHLORIDE 0.9 % IV SOLN
2.0000 g | Freq: Once | INTRAVENOUS | Status: AC
  Administered 2022-06-14: 2 g via INTRAVENOUS
  Filled 2022-06-14: qty 20

## 2022-06-14 MED ORDER — ALBUTEROL SULFATE (2.5 MG/3ML) 0.083% IN NEBU
2.5000 mg | INHALATION_SOLUTION | RESPIRATORY_TRACT | Status: DC | PRN
  Administered 2022-06-15: 2.5 mg via RESPIRATORY_TRACT
  Filled 2022-06-14: qty 3

## 2022-06-14 MED ORDER — PREDNISONE 50 MG PO TABS
50.0000 mg | ORAL_TABLET | Freq: Every day | ORAL | Status: DC
  Administered 2022-06-15 – 2022-06-19 (×5): 50 mg via ORAL
  Filled 2022-06-14 (×5): qty 1

## 2022-06-14 MED ORDER — IPRATROPIUM-ALBUTEROL 0.5-2.5 (3) MG/3ML IN SOLN: 3.0000 mL | RESPIRATORY_TRACT | Status: DC | PRN

## 2022-06-14 MED ORDER — INSULIN ASPART 100 UNIT/ML IJ SOLN
0.0000 [IU] | Freq: Three times a day (TID) | INTRAMUSCULAR | Status: DC
  Administered 2022-06-15: 3 [IU] via SUBCUTANEOUS
  Administered 2022-06-15 (×2): 5 [IU] via SUBCUTANEOUS
  Administered 2022-06-16: 2 [IU] via SUBCUTANEOUS
  Administered 2022-06-16 (×2): 5 [IU] via SUBCUTANEOUS
  Administered 2022-06-17: 3 [IU] via SUBCUTANEOUS
  Administered 2022-06-17: 8 [IU] via SUBCUTANEOUS
  Administered 2022-06-17: 3 [IU] via SUBCUTANEOUS
  Administered 2022-06-18: 8 [IU] via SUBCUTANEOUS
  Administered 2022-06-18: 2 [IU] via SUBCUTANEOUS
  Filled 2022-06-14: qty 0.15

## 2022-06-14 MED ORDER — METHYLPREDNISOLONE SODIUM SUCC 125 MG IJ SOLR
125.0000 mg | Freq: Once | INTRAMUSCULAR | Status: AC
  Administered 2022-06-14: 125 mg via INTRAVENOUS
  Filled 2022-06-14: qty 2

## 2022-06-14 MED ORDER — ATORVASTATIN CALCIUM 40 MG PO TABS
80.0000 mg | ORAL_TABLET | Freq: Every day | ORAL | Status: DC
  Administered 2022-06-15 – 2022-06-19 (×5): 80 mg via ORAL
  Filled 2022-06-14 (×5): qty 2

## 2022-06-14 MED ORDER — SODIUM CHLORIDE 0.9% FLUSH
3.0000 mL | Freq: Two times a day (BID) | INTRAVENOUS | Status: DC
  Administered 2022-06-14 – 2022-06-19 (×10): 3 mL via INTRAVENOUS

## 2022-06-14 NOTE — ED Triage Notes (Signed)
Pt c/o sob for the past week. Started new medication Keytruda about one week ago, this was his second infusion, doctor thinks he might be having a reaction to this.

## 2022-06-14 NOTE — H&P (Addendum)
History and Physical   Russell Burns ZOX:[id number] DOB: Nov 17, 1965 DOA: 06/14/2022  PCP: [name], MD   Patient coming from: Home  Chief Complaint: Shortness of breath  HPI: Russell Burns is a 56 y.o. male with medical history significant of renal cell carcinoma on Keytruda, diastolic CHF, hyperlipidemia, hypertension, diabetes presenting with a week of worsening shortness of breath.  Has ongoing chronic cough, this has actually improved recently.  Began to have shortness of breath around a week ago which has been steadily increasing.  He did see his pulmonologist on the 18th who is working him up for possible COPD though he is not formally diagnosed and he was prescribed a short course of steroids.  Despite this he has had continued worsening of his symptoms and presented to the ED for further evaluation.  He denies fevers, chills, chest pain, abdominal pain, constipation, diarrhea, nausea, vomiting.  ED Course: Signs in the ED significant for heart rate in the 100s initially now improved to the 90s, blood pressure in the 811B to 147W systolic, requiring 2 to 5 L to maintain saturations.  Lab work-up included CMP with BUN of 21, glucose 141.  CBC with hemoglobin of 17.3.  PT and INR within normal limits.  BNP normal.  Troponin borderline at 15 and then 20 on repeat.  Urinalysis pending.  Chest x-ray showed no acute abnormality.  CT PE study was negative for PE but did show bilateral bronchial wall thickening with scattered basilar consolidation and mucous plugging that could be consistent with postobstructive pneumonia.  Also noted with stable thoracic aneurysm.  Patient received ceftriaxone and azithromycin as well as Solu-Medrol and DuoNeb in the ED.  Admission requested for acute epoxy respiratory failure.  Case was discussed in the ED with patient's oncologist and they are aware of his admission.  Review of Systems: As per HPI otherwise all other systems reviewed and are  negative.  Past Medical History:  Diagnosis Date   Allergy    Aortic stenosis    Arthritis    Blood transfusion without reported diagnosis    Cancer ([place])    Chronic kidney disease    Diabetes mellitus without complication (HCC)    FH: thoracic aneurysm    Hyperlipidemia    Hypertension    Obesity     Past Surgical History:  Procedure Laterality Date   CYSTOSCOPY WITH URETEROSCOPY AND STENT PLACEMENT Left 02/12/2022   Procedure: CYSTOSCOPY WITH LEFT RETROGRADE PYELOGRAM, DIAGNOSTIC URETEROSCOPY AND STENT PLACEMENT;  Surgeon: Lucas Mallow, MD;  Location: [facility];  Service: Urology;  Laterality: Left;   LAPAROSCOPIC NEPHRECTOMY, HAND ASSISTED Left 03/12/2022   Procedure: LEFT HAND ASSISTED LAPAROSCOPIC NEPHRECTOMY;  Surgeon: Lucas Mallow, MD;  Location: [facility];  Service: Urology;  Laterality: Left;  NEED 2.5 HRS FOR THIS CASE   TONSILLECTOMY     WISDOM TOOTH EXTRACTION      Social History  reports that he has never smoked. His smokeless tobacco use includes chew. He reports current alcohol use. He reports that he does not use drugs.  Allergies  Allergen Reactions   Bee Venom Anaphylaxis    Family History  Problem Relation Age of Onset   CAD Other    Diabetes Mellitus II Other    Hypertension Other    Heart failure Other    Hypertension Mother    Heart attack Father        86, 71 at both ages   Diabetes Mellitus II Father  insulin dependent   Aneurysm Maternal Uncle        aortic   Heart attack Paternal Uncle 63       minor   Hypertension Paternal Uncle    Heart attack Maternal Grandmother 82       massive   Aneurysm Maternal Grandfather        abdominal   Heart attack Paternal Grandfather 68   Aneurysm Cousin 44       aortic   Colon polyps Neg Hx    Esophageal cancer Neg Hx    Rectal cancer Neg Hx    Stomach cancer Neg Hx   Reviewed on admission  Prior to Admission medications   Medication Sig Start Date End Date Taking? Authorizing  Provider  albuterol (VENTOLIN HFA) 108 (90 Base) MCG/ACT inhaler Inhale 1 puff into the lungs every 4 (four) hours as needed for wheezing or shortness of breath. 12/11/21   [provider]  amLODipine (NORVASC) 10 MG tablet Take 10 mg by mouth daily. 08/01/20   [provider]  atorvastatin (LIPITOR) 80 MG tablet Take 80 mg by mouth daily.    [provider]  docusate sodium (COLACE) 100 MG capsule Take 1 capsule (100 mg total) by mouth 2 (two) times daily. 03/12/22   Debbrah Alar, PA-C  EPINEPHrine 0.3 mg/0.3 mL IJ SOAJ injection Inject 0.3 mg into the muscle as needed for anaphylaxis.    [provider]  ezetimibe (ZETIA) 10 MG tablet Take 10 mg by mouth daily. 08/01/20   [provider]  famotidine (PEPCID) 20 MG tablet One after supper 06/11/22   Tanda Rockers, MD  JARDIANCE 25 MG TABS tablet Take 25 mg by mouth daily. 07/10/20   [provider]  losartan (COZAAR) 100 MG tablet Take 100 mg by mouth daily.    [provider]  metFORMIN (GLUCOPHAGE) 1000 MG tablet Take 1,000 mg by mouth 2 (two) times daily with a meal.    [provider]  oxyCODONE (OXY IR/ROXICODONE) 5 MG immediate release tablet Take 1-2 tablets (5-10 mg total) by mouth every 6 (six) hours as needed for severe pain or moderate pain. 03/12/22   Debbrah Alar, PA-C  pantoprazole (PROTONIX) 40 MG tablet Take 1 tablet (40 mg total) by mouth daily. Take 30-60 min before first meal of the day 06/11/22   Tanda Rockers, MD  predniSONE (DELTASONE) 10 MG tablet Take 4 for three days 3 for three days 2 for three days 1 for three days and stop 06/11/22   Tanda Rockers, MD  prochlorperazine (COMPAZINE) 10 MG tablet Take 1 tablet (10 mg total) by mouth every 6 (six) hours as needed for nausea or vomiting. 05/02/22   Wyatt Portela, MD  PROMETHAZINE HCL PO Take by mouth.    [provider]  promethazine-dextromethorphan (PROMETHAZINE-DM) 6.25-15 MG/5ML syrup Take  5-10 mLs by mouth 4 (four) times daily as needed for cough. 06/11/22   Tanda Rockers, MD  tamsulosin (FLOMAX) 0.4 MG CAPS capsule Take 1 capsule (0.4 mg total) by mouth daily. 02/12/22   Lucas Mallow, MD  tirzepatide [facility]) 10 MG/0.5ML Pen Inject 10 mg into the skin once a week. 11/14/21       Physical Exam: Vitals:   06/14/22 1700 06/14/22 1730 06/14/22 1800 06/14/22 1832  BP:  (!) 120/93 112/72   Pulse: 98 96 92   Resp: '12 13 11   '$ Temp:    98 F (36.7 C)  TempSrc:  Oral  SpO2: 91% (!) 89% 90%   Weight:      Height:        Physical Exam Constitutional:      General: He is not in acute distress.    Appearance: Normal appearance.  HENT:     Head: Normocephalic and atraumatic.     Mouth/Throat:     Mouth: Mucous membranes are moist.     Pharynx: Oropharynx is clear.  Eyes:     Extraocular Movements: Extraocular movements intact.     Pupils: Pupils are equal, round, and reactive to light.  Cardiovascular:     Rate and Rhythm: Normal rate and regular rhythm.     Pulses: Normal pulses.     Heart sounds: Normal heart sounds.  Pulmonary:     Effort: Pulmonary effort is normal. No respiratory distress.     Breath sounds: Wheezing present.  Abdominal:     General: Bowel sounds are normal. There is no distension.     Palpations: Abdomen is soft.     Tenderness: There is no abdominal tenderness.  Musculoskeletal:        General: No swelling or deformity.  Skin:    General: Skin is warm and dry.  Neurological:     General: No focal deficit present.     Mental Status: Mental status is at baseline.    Labs on Admission: I have personally reviewed following labs and imaging studies  CBC: Recent Labs  Lab 06/11/22 1600 06/14/22 1440  WBC 8.0 10.1  NEUTROABS 3.7 6.4  HGB 16.6 17.3*  HCT 49.6 51.4  MCV 94.5 93.1  PLT 255.0 539    Basic Metabolic Panel: Recent Labs  Lab 06/11/22 1600 06/14/22 1440  NA 136 136  K 4.4 3.6  CL 101 101  CO2 26 25   GLUCOSE 111* 141*  BUN 16 21*  CREATININE 1.26 1.21  CALCIUM 9.7 9.5    GFR: Estimated Creatinine Clearance: 84.3 mL/min (by C-G formula based on SCr of 1.21 mg/dL).  Liver Function Tests: Recent Labs  Lab 06/14/22 1440  AST 23  ALT 25  ALKPHOS 67  BILITOT 0.6  PROT 8.1  ALBUMIN 4.5    Urine analysis: No results found for: "COLORURINE", "APPEARANCEUR", "LABSPEC", "PHURINE", "GLUCOSEU", "HGBUR", "BILIRUBINUR", "KETONESUR", "PROTEINUR", "UROBILINOGEN", "NITRITE", "LEUKOCYTESUR"  Radiological Exams on Admission: CT Angio Chest PE W/Cm &/Or Wo Cm  Result Date: 06/14/2022 CLINICAL DATA:  Short of breath for 1 week, history of left renal cell carcinoma, recently began immunotherapy, positive D-dimer EXAM: CT ANGIOGRAPHY CHEST WITH CONTRAST TECHNIQUE: Multidetector CT imaging of the chest was performed using the standard protocol during bolus administration of intravenous contrast. Multiplanar CT image reconstructions and MIPs were obtained to evaluate the vascular anatomy. RADIATION DOSE REDUCTION: This exam was performed according to the departmental dose-optimization program which includes automated exposure control, adjustment of the mA and/or kV according to patient size and/or use of iterative reconstruction technique. CONTRAST:  45m OMNIPAQUE IOHEXOL 350 MG/ML SOLN COMPARISON:  06/14/2022, 01/01/2022 FINDINGS: Cardiovascular: This is a technically adequate evaluation of the pulmonary vasculature. No filling defects or pulmonary emboli. The heart is unremarkable without pericardial effusion. Stable calcifications of the aortic valve. Stable 4.6 cm ascending thoracic aortic aneurysm. No evidence of dissection. Mediastinum/Nodes: No enlarged mediastinal, hilar, or axillary lymph nodes. Thyroid gland, trachea, and esophagus demonstrate no significant findings. Lungs/Pleura: There is bilateral bronchial wall thickening with scattered areas of mucous plugging in the lower lobes. Patchy  consolidation within the lung bases  may reflect a combination of airspace disease and atelectasis. No effusion or pneumothorax. Upper Abdomen: No acute abnormality. Musculoskeletal: No acute or destructive bony lesions. Reconstructed images demonstrate no additional findings. Review of the MIP images confirms the above findings. IMPRESSION: 1. No evidence of pulmonary embolus. 2. Bilateral bronchial wall thickening, with scattered areas of bibasilar consolidation and mucous plugging. Findings could reflect postobstructive changes or pneumonia, in a more pronounced than on preceding exam. 3. Stable 4.6 cm ascending thoracic aortic aneurysm. Ascending thoracic aortic aneurysm. Recommend semi-annual imaging followup by CTA or MRA and referral to cardiothoracic surgery if not already obtained. This recommendation follows 2010 ACCF/AHA/AATS/ACR/ASA/SCA/SCAI/SIR/STS/SVM Guidelines for the Diagnosis and Management of Patients With Thoracic Aortic Disease. Circulation. 2010; 121: E174-Y814. Aortic aneurysm NOS (ICD10-I71.9) 4.  Aortic Atherosclerosis (ICD10-I70.0). Electronically Signed   By: Randa Ngo M.D.   On: 06/14/2022 17:37   DG Chest 1 View  Result Date: 06/14/2022 CLINICAL DATA:  Shortness of breath. EXAM: CHEST  1 VIEW COMPARISON:  Radiographs 06/11/2022 and 01/01/2022.  CT 01/01/2022. FINDINGS: 1457 hours. Lordotic positioning. The heart size and mediastinal contours are normal. The lungs are clear. There is no pleural effusion or pneumothorax. No acute osseous findings are identified. Telemetry leads overlie the chest. IMPRESSION: No evidence of active cardiopulmonary process. Electronically Signed   By: Richardean Sale M.D.   On: 06/14/2022 15:48    EKG: Independently reviewed.  Sinus tachycardia at 112 bpm.  Baseline wander.  Some nonspecific T wave changes that may be affected by baseline wander.  Assessment/Plan Principal Problem:   Acute respiratory failure with hypoxia (HCC) Active  Problems:   Diabetes mellitus without complication (HCC)   Essential hypertension   HLD (hyperlipidemia)   Chronic diastolic CHF (congestive heart failure) (HCC)   Malignant neoplasm of left kidney (HCC)   Acute respiratory failure with hypoxia > Not chronically on oxygen now with 2 to 5 L oxygen requirement.  Had received a short course of steroids from outpatient pulmonology without improvement few days ago. > Specific etiology remains unclear.  Imaging in the ED showed some mucous plugging and basilar consolidations that could be consistent with postobstructive pneumonia however patient has no fevers and no leukocytosis (CT was negative for pulmonary embolus).  > If he does have pneumonia could be viral etiology so we will check a full viral panel and COVID screen to help evaluate for this.  In addition we will check procalcitonin to evaluate for/help rule out bacterial pneumonia etiology. > Given patient is on [place] for his renal cell carcinoma there is a possibility of pneumonitis secondary to this. > No formal diagnosis of COPD although he is being worked up outpatient by pulmonologist.  This could also be playing a role he has had imaging with bronchial wall thickening in the past.  Currently on as needed albuterol only. > Received Solu-Medrol, DuoNeb, ceftriaxone and azithromycin in the ED. - Monitor on progressive given increasing oxygen requirement - Continue with supplemental oxygen, wean as tolerated - We will continue with daily steroids of 50 mg prednisone - Hold off on further antibiotics as we continue to work-up etiology of his respiratory failure - Follow-up RVP and COVID screen - Follow-up procalcitonin - Trend fever curve and WBC - Scheduled DuoNebs   Renal cell carcinoma > Currently on treatment with Keytruda.  Patient's oncologist, Dr. Alen Blew, was notified of patient's admission while he was in the ED. > Status post left nephrectomy. - Continue home  tamsulosin  Diabetes - SSI  Hypertension - Continue home losartan and amlodipine  Hyperlipidemia - Continue home atorvastatin and Zetia  Diastolic CHF > Last echo was in January of this year with EF 55-60%, G1 DD, normal RV function.  Not currently on any diuretic.  GERD - Continue PPI  DVT prophylaxis: Lovenox Code Status:   Full Family Communication:  None on admission  Disposition Plan:   Patient is from:  Home  Anticipated DC to:  Home  Anticipated DC date:  1 to 4 days  Anticipated DC barriers: None  Consults called:  None Admission status:  Observation, progressive  Severity of Illness: The appropriate patient status for this patient is OBSERVATION. Observation status is judged to be reasonable and necessary in order to provide the required intensity of service to ensure the patient's safety. The patient's presenting symptoms, physical exam findings, and initial radiographic and laboratory data in the context of their medical condition is felt to place them at decreased risk for further clinical deterioration. Furthermore, it is anticipated that the patient will be medically stable for discharge from the hospital within 2 midnights of admission.    Marcelyn Bruins MD [facility]  How to contact the [facility] Attending or Consulting provider [place] or covering provider during after hours [place], for this patient?   Check the care team in [facility] and look for a) attending/consulting [facility] provider listed and b) the [facility] team listed Log into [email or web address] and use [place]'s universal password to access. If you do not have the password, please contact the hospital operator. Locate the [facility] provider you are looking for under [facility] and page to a number that you can be directly reached. If you still have difficulty reaching the provider, please page the [facility] (Director on Call) for the Hospitalists listed on amion for assistance.  06/14/2022, 6:33 PM

## 2022-06-14 NOTE — ED Notes (Signed)
ED TO INPATIENT HANDOFF REPORT  ED Nurse Name and Phone #: Baxter Flattery, RN  S Name/Age/Gender Russell Burns 56 y.o. male Room/Bed: WA02/WA02  Code Status   Code Status: Full Code  Home/SNF/Other Home Patient oriented to: self, place, time, and situation Is this baseline? Yes   Triage Complete: Triage complete  Chief Complaint Acute respiratory failure with hypoxia ([name]) [J96.01]  Triage Note Pt c/o sob for the past week. Started new medication Keytruda about one week ago, this was his second infusion, doctor thinks he might be having a reaction to this.   Allergies Allergies  Allergen Reactions   Bee Venom Anaphylaxis    Level of Care/Admitting Diagnosis ED Disposition     ED Disposition  Admit   Condition  --   Comment  Hospital Area: [facility] [[id number]]  Level of Care: Telemetry [5]  Admit to tele based on following criteria: Other see comments  Admit to tele based on following criteria: Complex arrhythmia (Bradycardia/Tachycardia)  Comments: intermittent tachycardia, acute hypoxic respiratory failure  May place patient in observation at [facility] or [facility] if equivalent level of care is available:: No  Covid Evaluation: Symptomatic Person Under Investigation (PUI) or recent exposure (last 10 days) *Testing Required*  Diagnosis: Acute respiratory failure with hypoxia [facility]) [[id number]]  Admitting Physician: Marcelyn Bruins [[id number]]  Attending Physician: Marcelyn Bruins [phone]          B Medical/Surgery History Past Medical History:  Diagnosis Date   Allergy    Aortic stenosis    Arthritis    Blood transfusion without reported diagnosis    Cancer ([place])    Chronic kidney disease    Diabetes mellitus without complication ([place])    FH: thoracic aneurysm    Hyperlipidemia    Hypertension    Obesity    Past Surgical History:  Procedure Laterality Date   CYSTOSCOPY WITH URETEROSCOPY AND STENT PLACEMENT Left 02/12/2022    Procedure: CYSTOSCOPY WITH LEFT RETROGRADE PYELOGRAM, DIAGNOSTIC URETEROSCOPY AND STENT PLACEMENT;  Surgeon: Lucas Mallow, MD;  Location: [facility];  Service: Urology;  Laterality: Left;   LAPAROSCOPIC NEPHRECTOMY, HAND ASSISTED Left 03/12/2022   Procedure: LEFT HAND ASSISTED LAPAROSCOPIC NEPHRECTOMY;  Surgeon: Lucas Mallow, MD;  Location: [facility];  Service: Urology;  Laterality: Left;  NEED 2.5 HRS FOR THIS CASE   TONSILLECTOMY     WISDOM TOOTH EXTRACTION       A IV Location/Drains/Wounds Patient Lines/Drains/Airways Status     Active Line/Drains/Airways     Name Placement date Placement time Site Days   Peripheral IV 06/14/22 20 G 1.88" Anterior;Proximal;Right Forearm 06/14/22  1439  Forearm  less than 1   Ureteral Drain/Stent Left ureter 6 Fr. 02/12/22  1032  Left ureter  122   Incision (Closed) 03/12/22 Abdomen Other (Comment) 03/12/22  0848  -- 94   Incision - 2 Ports Abdomen Right;Mid Left;Upper 03/12/22  0957  -- 94            Intake/Output Last 24 hours No intake or output data in the 24 hours ending 06/14/22 1859  Labs/Imaging Results for orders placed or performed during the hospital encounter of 06/14/22 (from the past 48 hour(s))  Blood gas, venous     Status: Abnormal   Collection Time: 06/14/22  2:36 PM  Result Value Ref Range   pH, Ven 7.42 7.25 - 7.43   pCO2, Ven 43 (L) 44 - 60 mmHg   pO2, Ven 62 (H)  32 - 45 mmHg   Bicarbonate 27.9 20.0 - 28.0 mmol/L   Acid-Base Excess 2.9 (H) 0.0 - 2.0 mmol/L   O2 Saturation 92.3 %   Patient temperature 37.0     Comment: Performed at [facility], [place] [address], [place], [place] [address]  Brain natriuretic peptide     Status: None   Collection Time: 06/14/22  2:40 PM  Result Value Ref Range   B Natriuretic Peptide 27.9 0.0 - 100.0 pg/mL    Comment: Performed at [facility], [place] [address], [place], [place] [address]  Comprehensive metabolic panel     Status: Abnormal    Collection Time: 06/14/22  2:40 PM  Result Value Ref Range   Sodium 136 135 - 145 mmol/L   Potassium 3.6 3.5 - 5.1 mmol/L   Chloride 101 98 - 111 mmol/L   CO2 25 22 - 32 mmol/L   Glucose, Bld 141 (H) 70 - 99 mg/dL    Comment: Glucose reference range applies only to samples taken after fasting for at least 8 hours.   BUN 21 (H) 6 - 20 mg/dL   Creatinine, Ser 1.21 0.61 - 1.24 mg/dL   Calcium 9.5 8.9 - 10.3 mg/dL   Total Protein 8.1 6.5 - 8.1 g/dL   Albumin 4.5 3.5 - 5.0 g/dL   AST 23 15 - 41 U/L   ALT 25 0 - 44 U/L   Alkaline Phosphatase 67 38 - 126 U/L   Total Bilirubin 0.6 0.3 - 1.2 mg/dL   GFR, Estimated >60 >60 mL/min    Comment: (NOTE) Calculated using the CKD-EPI Creatinine Equation (2021)    Anion gap 10 5 - 15    Comment: Performed at [facility], [place] [address], [place], [place] [address]  Troponin I (High Sensitivity)     Status: None   Collection Time: 06/14/22  2:40 PM  Result Value Ref Range   Troponin I (High Sensitivity) 15 <18 ng/L    Comment: (NOTE) Elevated high sensitivity troponin I (hsTnI) values and significant  changes across serial measurements may suggest ACS but many other  chronic and acute conditions are known to elevate hsTnI results.  Refer to the "Links" section for chest pain algorithms and additional  guidance. Performed at [facility], [place] [address] [place], [place] [address]   CBC with Differential     Status: Abnormal   Collection Time: 06/14/22  2:40 PM  Result Value Ref Range   WBC 10.1 4.0 - 10.5 K/uL   RBC 5.52 4.22 - 5.81 MIL/uL   Hemoglobin 17.3 (H) 13.0 - 17.0 g/dL   HCT 51.4 39.0 - 52.0 %   MCV 93.1 80.0 - 100.0 fL   MCH 31.3 26.0 - 34.0 pg   MCHC 33.7 30.0 - 36.0 g/dL   RDW 12.4 11.5 - 15.5 %   Platelets 327 150 - 400 K/uL   nRBC 0.0 0.0 - 0.2 %   Neutrophils Relative % 64 %   Neutro Abs 6.4 1.7 - 7.7 K/uL   Lymphocytes Relative 26 %   Lymphs Abs 2.6 0.7 - 4.0 K/uL   Monocytes  Relative 8 %   Monocytes Absolute 0.8 0.1 - 1.0 K/uL   Eosinophils Relative 1 %   Eosinophils Absolute 0.1 0.0 - 0.5 K/uL   Basophils Relative 1 %   Basophils Absolute 0.1 0.0 - 0.1 K/uL   Immature Granulocytes 0 %   Abs Immature Granulocytes 0.04 0.00 -  0.07 K/uL    Comment: Performed at [facility], [place] [address] [place] [address] [place], [place] [address]  Protime-INR     Status: None   Collection Time: 06/14/22  2:40 PM  Result Value Ref Range   Prothrombin Time 12.2 11.4 - 15.2 seconds   INR 0.9 0.8 - 1.2    Comment: (NOTE) INR goal varies based on device and disease states. Performed at [facility], [place] [address] [place], [place] [address]   Troponin I (High Sensitivity)     Status: Abnormal   Collection Time: 06/14/22  4:21 PM  Result Value Ref Range   Troponin I (High Sensitivity) 20 (H) <18 ng/L    Comment: (NOTE) Elevated high sensitivity troponin I (hsTnI) values and significant  changes across serial measurements may suggest ACS but many other  chronic and acute conditions are known to elevate hsTnI results.  Refer to the "Links" section for chest pain algorithms and additional  guidance. Performed at [facility], [place] [address] [place], [place] [address]    CT Angio Chest PE W/Cm &/Or Wo Cm  Result Date: 06/14/2022 CLINICAL DATA:  Short of breath for 1 week, history of left renal cell carcinoma, recently began immunotherapy, positive D-dimer EXAM: CT ANGIOGRAPHY CHEST WITH CONTRAST TECHNIQUE: Multidetector CT imaging of the chest was performed using the standard protocol during bolus administration of intravenous contrast. Multiplanar CT image reconstructions and MIPs were obtained to evaluate the vascular anatomy. RADIATION DOSE REDUCTION: This exam was performed according to the departmental dose-optimization program which includes automated exposure control, adjustment of the mA and/or kV according to patient size  and/or use of iterative reconstruction technique. CONTRAST:  5m OMNIPAQUE IOHEXOL 350 MG/ML SOLN COMPARISON:  06/14/2022, 01/01/2022 FINDINGS: Cardiovascular: This is a technically adequate evaluation of the pulmonary vasculature. No filling defects or pulmonary emboli. The heart is unremarkable without pericardial effusion. Stable calcifications of the aortic valve. Stable 4.6 cm ascending thoracic aortic aneurysm. No evidence of dissection. Mediastinum/Nodes: No enlarged mediastinal, hilar, or axillary lymph nodes. Thyroid gland, trachea, and esophagus demonstrate no significant findings. Lungs/Pleura: There is bilateral bronchial wall thickening with scattered areas of mucous plugging in the lower lobes. Patchy consolidation within the lung bases may reflect a combination of airspace disease and atelectasis. No effusion or pneumothorax. Upper Abdomen: No acute abnormality. Musculoskeletal: No acute or destructive bony lesions. Reconstructed images demonstrate no additional findings. Review of the MIP images confirms the above findings. IMPRESSION: 1. No evidence of pulmonary embolus. 2. Bilateral bronchial wall thickening, with scattered areas of bibasilar consolidation and mucous plugging. Findings could reflect postobstructive changes or pneumonia, in a more pronounced than on preceding exam. 3. Stable 4.6 cm ascending thoracic aortic aneurysm. Ascending thoracic aortic aneurysm. Recommend semi-annual imaging followup by CTA or MRA and referral to cardiothoracic surgery if not already obtained. This recommendation follows 2010 ACCF/AHA/AATS/ACR/ASA/SCA/SCAI/SIR/STS/SVM Guidelines for the Diagnosis and Management of Patients With Thoracic Aortic Disease. Circulation. 2010; 121:: [id number] Aortic aneurysm NOS (ICD10-I71.9) 4.  Aortic Atherosclerosis (ICD10-I70.0). Electronically Signed   By: MRanda NgoM.D.   On: 06/14/2022 17:37   DG Chest 1 View  Result Date: 06/14/2022 CLINICAL DATA:  Shortness of  breath. EXAM: CHEST  1 VIEW COMPARISON:  Radiographs 06/11/2022 and 01/01/2022.  CT 01/01/2022. FINDINGS: 1457 hours. Lordotic positioning. The heart size and mediastinal contours are normal. The lungs are clear. There is no pleural effusion or pneumothorax. No acute osseous findings are identified. Telemetry leads overlie the chest. IMPRESSION:  No evidence of active cardiopulmonary process. Electronically Signed   By: Richardean Sale M.D.   On: 06/14/2022 15:48    Pending Labs Unresulted Labs (From admission, onward)     Start     Ordered   06/21/22 0500  Creatinine, serum  (enoxaparin (LOVENOX)    CrCl >/= 30 ml/min)  Weekly,   R     Comments: while on enoxaparin therapy    06/14/22 1832   06/15/22 0500  Comprehensive metabolic panel  Tomorrow morning,   R        06/14/22 1832   06/15/22 0500  CBC  Tomorrow morning,   R        06/14/22 1832   06/15/22 0500  Procalcitonin  Daily at 5am,   R      06/14/22 1832   06/14/22 1832  SARS Coronavirus 2 by RT PCR (hospital order, performed in [place] hospital lab) *cepheid single result test* Anterior Nasal Swab  (Tier 2 - Symptomatic/Asymptomatic)  Once,   R        06/14/22 1832   06/14/22 1832  Procalcitonin - Baseline  ONCE - URGENT,   URGENT        06/14/22 1832   06/14/22 1832  Respiratory (~20 pathogens) panel by PCR  (Respiratory panel by PCR (~20 pathogens, ~24 hr TAT)  w precautions)  Once,   R        06/14/22 1832   06/14/22 1415  Urinalysis, Routine w reflex microscopic  Once,   URGENT        06/14/22 1414            Vitals/Pain Today's Vitals   06/14/22 1700 06/14/22 1730 06/14/22 1800 06/14/22 1832  BP:  (!) 120/93 112/72   Pulse: 98 96 92   Resp: '12 13 11   '$ Temp:    98 F (36.7 C)  TempSrc:    Oral  SpO2: 91% (!) 89% 90%   Weight:      Height:      PainSc:        Isolation Precautions Droplet precaution  Medications Medications  azithromycin (ZITHROMAX) 500 mg in sodium chloride 0.9 % 250 mL IVPB (has no  administration in time range)  atorvastatin (LIPITOR) tablet 80 mg (has no administration in time range)  ezetimibe (ZETIA) tablet 10 mg (has no administration in time range)  amLODipine (NORVASC) tablet 10 mg (has no administration in time range)  losartan (COZAAR) tablet 100 mg (has no administration in time range)  pantoprazole (PROTONIX) EC tablet 40 mg (has no administration in time range)  enoxaparin (LOVENOX) injection 40 mg (has no administration in time range)  sodium chloride flush (NS) 0.9 % injection 3 mL (has no administration in time range)  acetaminophen (TYLENOL) tablet 650 mg (has no administration in time range)    Or  acetaminophen (TYLENOL) suppository 650 mg (has no administration in time range)  polyethylene glycol (MIRALAX / GLYCOLAX) packet 17 g (has no administration in time range)  predniSONE (DELTASONE) tablet 50 mg (has no administration in time range)  ipratropium-albuterol (DUONEB) 0.5-2.5 (3) MG/3ML nebulizer solution 3 mL (has no administration in time range)  insulin aspart (novoLOG) injection 0-15 Units (has no administration in time range)  ipratropium-albuterol (DUONEB) 0.5-2.5 (3) MG/3ML nebulizer solution 3 mL (3 mLs Nebulization Given 06/14/22 1423)  methylPREDNISolone sodium succinate (SOLU-MEDROL) 125 mg/2 mL injection 125 mg (125 mg Intravenous Given 06/14/22 1542)  iohexol (OMNIPAQUE) 350 MG/ML injection 80 mL (80 mLs  Intravenous Contrast Given 06/14/22 1708)  cefTRIAXone (ROCEPHIN) 2 g in sodium chloride 0.9 % 100 mL IVPB (2 g Intravenous New Bag/Given 06/14/22 1828)    Mobility walks Low fall risk   Focused Assessments Pulmonary Assessment Handoff:  Lung sounds:   O2 Device: Nasal Cannula O2 Flow Rate (L/min): 5 L/min    R Recommendations: See Admitting Provider Note  Report given to:   Additional Notes:

## 2022-06-14 NOTE — ED Provider Notes (Signed)
[facility] DEPT Provider Note  CSN: [id number] Arrival date & time: 06/14/22 1354  Chief Complaint(s) Shortness of Breath  HPI Russell Burns is a 56 y.o. male with history of diabetes, hypertension, lipidemia, renal cell carcinoma on Keytruda presenting with shortness of breath.  Reports 1 week of progressive shortness of breath.  He started [place] recently.  He reports that he saw his pulmonologist a few days ago, since then is gotten worse.  Does not use oxygen at home.  Reports some dry cough but very mild and actually better than previous.  No fevers or chills.  No chest pain.  No leg swelling.  No recent travel or surgeries denies any similar episodes in the past.   Past Medical History Past Medical History:  Diagnosis Date   Allergy    Aortic stenosis    Arthritis    Blood transfusion without reported diagnosis    Cancer ([place])    Chronic kidney disease    Diabetes mellitus without complication ([place])    FH: thoracic aneurysm    Hyperlipidemia    Hypertension    Obesity    Patient Active Problem List   Diagnosis Date Noted   DOE (dyspnea on exertion) 06/11/2022   Chronic cough 06/11/2022   Malignant neoplasm of left kidney ([place]) 05/02/2022   Renal mass 03/12/2022   Preop cardiovascular exam 02/07/2022   Chronic diastolic CHF (congestive heart failure) ([place]) 01/02/2022   Acute respiratory failure with hypoxia ([place]) 01/01/2022   Essential hypertension 01/01/2022   Thoracic aortic aneurysm ([place]) 01/01/2022   HLD (hyperlipidemia) 01/01/2022   Left renal mass 01/01/2022   Diabetes mellitus without complication ([place])    Home Medication(s) Prior to Admission medications   Medication Sig Start Date End Date Taking? Authorizing Provider  albuterol (VENTOLIN HFA) 108 (90 Base) MCG/ACT inhaler Inhale 1 puff into the lungs every 4 (four) hours as needed for wheezing or shortness of breath. 12/11/21   [provider]  amLODipine  (NORVASC) 10 MG tablet Take 10 mg by mouth daily. 08/01/20   [provider]  atorvastatin (LIPITOR) 80 MG tablet Take 80 mg by mouth daily.    [provider]  docusate sodium (COLACE) 100 MG capsule Take 1 capsule (100 mg total) by mouth 2 (two) times daily. 03/12/22   Debbrah Alar, PA-C  EPINEPHrine 0.3 mg/0.3 mL IJ SOAJ injection Inject 0.3 mg into the muscle as needed for anaphylaxis.    [provider]  ezetimibe (ZETIA) 10 MG tablet Take 10 mg by mouth daily. 08/01/20   [provider]  famotidine (PEPCID) 20 MG tablet One after supper 06/11/22   Tanda Rockers, MD  JARDIANCE 25 MG TABS tablet Take 25 mg by mouth daily. 07/10/20   [provider]  losartan (COZAAR) 100 MG tablet Take 100 mg by mouth daily.    [provider]  metFORMIN (GLUCOPHAGE) 1000 MG tablet Take 1,000 mg by mouth 2 (two) times daily with a meal.    [provider]  oxyCODONE (OXY IR/ROXICODONE) 5 MG immediate release tablet Take 1-2 tablets (5-10 mg total) by mouth every 6 (six) hours as needed for severe pain or moderate pain. 03/12/22   Debbrah Alar, PA-C  pantoprazole (PROTONIX) 40 MG tablet Take 1 tablet (40 mg total) by mouth daily. Take 30-60 min before first meal of the day 06/11/22   Tanda Rockers, MD  predniSONE (DELTASONE) 10 MG tablet Take 4 for three days 3 for three days  2 for three days 1 for three days and stop 06/11/22   Tanda Rockers, MD  prochlorperazine (COMPAZINE) 10 MG tablet Take 1 tablet (10 mg total) by mouth every 6 (six) hours as needed for nausea or vomiting. 05/02/22   Wyatt Portela, MD  PROMETHAZINE HCL PO Take by mouth.    [provider]  promethazine-dextromethorphan (PROMETHAZINE-DM) 6.25-15 MG/5ML syrup Take 5-10 mLs by mouth 4 (four) times daily as needed for cough. 06/11/22   Tanda Rockers, MD  tamsulosin (FLOMAX) 0.4 MG CAPS capsule Take 1 capsule (0.4 mg total) by mouth daily. 02/12/22   Lucas Mallow, MD   tirzepatide [facility]) 10 MG/0.5ML Pen Inject 10 mg into the skin once a week. 11/14/21                                                                                                                                       Past Surgical History Past Surgical History:  Procedure Laterality Date   CYSTOSCOPY WITH URETEROSCOPY AND STENT PLACEMENT Left 02/12/2022   Procedure: CYSTOSCOPY WITH LEFT RETROGRADE PYELOGRAM, DIAGNOSTIC URETEROSCOPY AND STENT PLACEMENT;  Surgeon: Lucas Mallow, MD;  Location: [facility]S;  Service: Urology;  Laterality: Left;   LAPAROSCOPIC NEPHRECTOMY, HAND ASSISTED Left 03/12/2022   Procedure: LEFT HAND ASSISTED LAPAROSCOPIC NEPHRECTOMY;  Surgeon: Lucas Mallow, MD;  Location: [facility];  Service: Urology;  Laterality: Left;  NEED 2.5 HRS FOR THIS CASE   TONSILLECTOMY     WISDOM TOOTH EXTRACTION     Family History Family History  Problem Relation Age of Onset   CAD Other    Diabetes Mellitus II Other    Hypertension Other    Heart failure Other    Hypertension Mother    Heart attack Father        4, 72 at both ages   Diabetes Mellitus II Father        insulin dependent   Aneurysm Maternal Uncle        aortic   Heart attack Paternal Uncle 67       minor   Hypertension Paternal Uncle    Heart attack Maternal Grandmother 82       massive   Aneurysm Maternal Grandfather        abdominal   Heart attack Paternal Grandfather 45   Aneurysm Cousin 44       aortic   Colon polyps Neg Hx    Esophageal cancer Neg Hx    Rectal cancer Neg Hx    Stomach cancer Neg Hx     Social History Social History   Tobacco Use   Smoking status: Never   Smokeless tobacco: Current    Types: Chew  Vaping Use   Vaping Use: Never used  Substance Use Topics   Alcohol use: Yes    Comment: 3 drinks a week.    Drug use:  No   Allergies Bee venom  Review of Systems Review of Systems  All other systems reviewed and are negative.   Physical Exam Vital Signs  I have  reviewed the triage vital signs BP (!) 140/96   Pulse (!) 107   Temp 97.7 F (36.5 C) (Oral)   Resp 14   Ht '5\' 10"'$  (1.778 m)   Wt 106.6 kg   SpO2 94%   BMI 33.72 kg/m  Physical Exam Vitals and nursing note reviewed.  Constitutional:      General: He is in acute distress.     Appearance: Normal appearance.  HENT:     Mouth/Throat:     Mouth: Mucous membranes are moist.  Eyes:     Conjunctiva/sclera: Conjunctivae normal.  Cardiovascular:     Rate and Rhythm: Normal rate and regular rhythm.  Pulmonary:     Effort: Tachypnea and respiratory distress present.     Breath sounds: Examination of the right-upper field reveals wheezing. Examination of the left-upper field reveals wheezing. Examination of the right-middle field reveals wheezing. Examination of the left-middle field reveals wheezing. Examination of the right-lower field reveals wheezing. Examination of the left-lower field reveals wheezing. Wheezing present.  Abdominal:     General: Abdomen is flat.     Palpations: Abdomen is soft.     Tenderness: There is no abdominal tenderness.  Musculoskeletal:     Right lower leg: No edema.     Left lower leg: No edema.  Skin:    General: Skin is warm and dry.     Capillary Refill: Capillary refill takes less than 2 seconds.  Neurological:     Mental Status: He is alert and oriented to person, place, and time. Mental status is at baseline.  Psychiatric:        Mood and Affect: Mood normal.        Behavior: Behavior normal.     ED Results and Treatments Labs (all labs ordered are listed, but only abnormal results are displayed) Labs Reviewed  CBC WITH DIFFERENTIAL/PLATELET - Abnormal; Notable for the following components:      Result Value   Hemoglobin 17.3 (*)    All other components within normal limits  BLOOD GAS, VENOUS - Abnormal; Notable for the following components:   pCO2, Ven 43 (*)    pO2, Ven 62 (*)    Acid-Base Excess 2.9 (*)    All other components within  normal limits  PROTIME-INR  BRAIN NATRIURETIC PEPTIDE  COMPREHENSIVE METABOLIC PANEL  URINALYSIS, ROUTINE W REFLEX MICROSCOPIC  TROPONIN I (HIGH SENSITIVITY)                                                                                                                          Radiology No results found.  Pertinent labs & imaging results that were available during my care of the patient were reviewed by me and considered in my medical decision making (see MDM for details).  Medications Ordered in  ED Medications  methylPREDNISolone sodium succinate (SOLU-MEDROL) 125 mg/2 mL injection 125 mg (has no administration in time range)  ipratropium-albuterol (DUONEB) 0.5-2.5 (3) MG/3ML nebulizer solution 3 mL (3 mLs Nebulization Given 06/14/22 1423)                                                                                                                                     Procedures .1-3 Lead EKG Interpretation  Performed by: Cristie Hem, MD Authorized by: Cristie Hem, MD     Interpretation: abnormal     ECG rate assessment: tachycardic     Rhythm: sinus rhythm     Ectopy: none     Conduction: normal   .Critical Care  Performed by: Cristie Hem, MD Authorized by: Cristie Hem, MD   Critical care provider statement:    Critical care time (minutes):  30   Critical care end time:  06/14/2022 3:36 PM   Critical care time was exclusive of:  Separately billable procedures and treating other patients   Critical care was necessary to treat or prevent imminent or life-threatening deterioration of the following conditions:  Respiratory failure and shock   Critical care was time spent personally by me on the following activities:  Development of treatment plan with patient or surrogate, discussions with consultants, evaluation of patient's response to treatment, examination of patient, ordering and review of laboratory studies, ordering and review of radiographic  studies, ordering and performing treatments and interventions, pulse oximetry, re-evaluation of patient's condition and review of old charts   (including critical care time)  Medical Decision Making / ED Course   MDM:  56 year old male presenting to the emergency department with shortness of breath.  Patient in mild distress, hypoxic, tachycardic with diffuse wheezing.  Symptoms could be related to Keytruda pneumonitis or possibly underlying obstructive pulmonary disease such as COPD.  We will treat with steroids, DuoNeb.  Talked with Dr. Alen Blew, patient's oncologist, who agrees with admission.  Less likely pneumonia, chest x-ray without clear pneumonia, some haziness on my interpretation which could represent a pneumonitis but will obtain CT angiography to evaluate for pulmonary embolism given history of malignancy.  No evidence of pneumothorax on chest x-ray.  Clinical Course as of 06/14/22 1537  Thu Jun 14, 2022  1533 Signed out to Dr. Roderic Palau pending CTA chest and remainder of labs.  [WS]    Clinical Course User Index [WS] Cristie Hem, MD     Additional history obtained: -External records from outside source obtained and reviewed including: Chart review including previous notes, labs, imaging, consultation notes   Lab Tests: -I ordered, reviewed, and interpreted labs.   The pertinent results include:   Labs Reviewed  CBC WITH DIFFERENTIAL/PLATELET - Abnormal; Notable for the following components:      Result Value   Hemoglobin 17.3 (*)    All other components within normal limits  BLOOD GAS, VENOUS -  Abnormal; Notable for the following components:   pCO2, Ven 43 (*)    pO2, Ven 62 (*)    Acid-Base Excess 2.9 (*)    All other components within normal limits  PROTIME-INR  BRAIN NATRIURETIC PEPTIDE  COMPREHENSIVE METABOLIC PANEL  URINALYSIS, ROUTINE W REFLEX MICROSCOPIC  TROPONIN I (HIGH SENSITIVITY)      EKG   EKG Interpretation  Date/Time:    Ventricular  Rate:    PR Interval:    QRS Duration:   QT Interval:    QTC Calculation:   R Axis:     Text Interpretation:           Imaging Studies ordered: I ordered imaging studies including CXR On my interpretation imaging demonstrates no infiltrate I independently visualized and interpreted imaging. I agree with the radiologist interpretation   Medicines ordered and prescription drug management: Meds ordered this encounter  Medications   ipratropium-albuterol (DUONEB) 0.5-2.5 (3) MG/3ML nebulizer solution 3 mL   methylPREDNISolone sodium succinate (SOLU-MEDROL) 125 mg/2 mL injection 125 mg    IV methylprednisolone will be converted to either a q12h or q24h frequency with the same total daily dose (TDD).  Ordered Dose: 1 to 125 mg TDD; convert to: TDD q24h.  Ordered Dose: 126 to 250 mg TDD; convert to: TDD div q12h.  Ordered Dose: >250 mg TDD; DAW.    -I have reviewed the patients home medicines and have made adjustments as needed   Consultations Obtained: I requested consultation with the oncologist,  and discussed lab and imaging findings as well as pertinent plan - they recommend: admission   Cardiac Monitoring: The patient was maintained on a cardiac monitor.  I personally viewed and interpreted the cardiac monitored which showed an underlying rhythm of: sinus tachycardia  Social Determinants of Health:  Factors impacting patients care include: former smoker   Reevaluation: After the interventions noted above, I reevaluated the patient and found that they have improved  Co morbidities that complicate the patient evaluation  Past Medical History:  Diagnosis Date   Allergy    Aortic stenosis    Arthritis    Blood transfusion without reported diagnosis    Cancer ([place])    Chronic kidney disease    Diabetes mellitus without complication ([facility])    FH: thoracic aneurysm    Hyperlipidemia    Hypertension    Obesity       Dispostion: Pending at sign out    Final  Clinical Impression(s) / ED Diagnoses Final diagnoses:  Hypoxia     This chart was dictated using voice recognition software.  Despite best efforts to proofread,  errors can occur which can change the documentation meaning.    Cristie Hem, MD 06/14/22 1537

## 2022-06-15 DIAGNOSIS — K219 Gastro-esophageal reflux disease without esophagitis: Secondary | ICD-10-CM | POA: Diagnosis present

## 2022-06-15 DIAGNOSIS — Z9221 Personal history of antineoplastic chemotherapy: Secondary | ICD-10-CM | POA: Diagnosis not present

## 2022-06-15 DIAGNOSIS — E785 Hyperlipidemia, unspecified: Secondary | ICD-10-CM | POA: Diagnosis present

## 2022-06-15 DIAGNOSIS — J189 Pneumonia, unspecified organism: Secondary | ICD-10-CM | POA: Diagnosis present

## 2022-06-15 DIAGNOSIS — C642 Malignant neoplasm of left kidney, except renal pelvis: Secondary | ICD-10-CM | POA: Diagnosis present

## 2022-06-15 DIAGNOSIS — E119 Type 2 diabetes mellitus without complications: Secondary | ICD-10-CM | POA: Diagnosis present

## 2022-06-15 DIAGNOSIS — Z20822 Contact with and (suspected) exposure to covid-19: Secondary | ICD-10-CM | POA: Diagnosis present

## 2022-06-15 DIAGNOSIS — I110 Hypertensive heart disease with heart failure: Secondary | ICD-10-CM | POA: Diagnosis present

## 2022-06-15 DIAGNOSIS — F17220 Nicotine dependence, chewing tobacco, uncomplicated: Secondary | ICD-10-CM | POA: Diagnosis present

## 2022-06-15 DIAGNOSIS — J9601 Acute respiratory failure with hypoxia: Secondary | ICD-10-CM | POA: Diagnosis present

## 2022-06-15 DIAGNOSIS — Z7984 Long term (current) use of oral hypoglycemic drugs: Secondary | ICD-10-CM | POA: Diagnosis not present

## 2022-06-15 DIAGNOSIS — I712 Thoracic aortic aneurysm, without rupture, unspecified: Secondary | ICD-10-CM | POA: Diagnosis present

## 2022-06-15 DIAGNOSIS — Z79899 Other long term (current) drug therapy: Secondary | ICD-10-CM | POA: Diagnosis not present

## 2022-06-15 DIAGNOSIS — Z6833 Body mass index (BMI) 33.0-33.9, adult: Secondary | ICD-10-CM | POA: Diagnosis not present

## 2022-06-15 DIAGNOSIS — Z794 Long term (current) use of insulin: Secondary | ICD-10-CM | POA: Diagnosis not present

## 2022-06-15 DIAGNOSIS — Z8249 Family history of ischemic heart disease and other diseases of the circulatory system: Secondary | ICD-10-CM | POA: Diagnosis not present

## 2022-06-15 DIAGNOSIS — Z905 Acquired absence of kidney: Secondary | ICD-10-CM | POA: Diagnosis not present

## 2022-06-15 DIAGNOSIS — I5032 Chronic diastolic (congestive) heart failure: Secondary | ICD-10-CM | POA: Diagnosis present

## 2022-06-15 DIAGNOSIS — R0902 Hypoxemia: Secondary | ICD-10-CM | POA: Diagnosis present

## 2022-06-15 DIAGNOSIS — Z833 Family history of diabetes mellitus: Secondary | ICD-10-CM | POA: Diagnosis not present

## 2022-06-15 DIAGNOSIS — E669 Obesity, unspecified: Secondary | ICD-10-CM | POA: Diagnosis present

## 2022-06-15 LAB — COMPREHENSIVE METABOLIC PANEL
ALT: 25 U/L (ref 0–44)
AST: 24 U/L (ref 15–41)
Albumin: 4 g/dL (ref 3.5–5.0)
Alkaline Phosphatase: 65 U/L (ref 38–126)
Anion gap: 11 (ref 5–15)
BUN: 25 mg/dL — ABNORMAL HIGH (ref 6–20)
CO2: 26 mmol/L (ref 22–32)
Calcium: 9.4 mg/dL (ref 8.9–10.3)
Chloride: 100 mmol/L (ref 98–111)
Creatinine, Ser: 1.3 mg/dL — ABNORMAL HIGH (ref 0.61–1.24)
GFR, Estimated: >60 mL/min (ref >60)
Glucose, Bld: 191 mg/dL — ABNORMAL HIGH (ref 70–99)
Potassium: 4.9 mmol/L (ref 3.5–5.1)
Sodium: 137 mmol/L (ref 135–145)
Total Bilirubin: 0.7 mg/dL (ref 0.3–1.2)
Total Protein: 7.8 g/dL (ref 6.5–8.1)

## 2022-06-15 LAB — MRSA NEXT GEN BY PCR, NASAL: MRSA by PCR Next Gen: NOT DETECTED

## 2022-06-15 LAB — URINALYSIS, ROUTINE W REFLEX MICROSCOPIC
Bacteria, UA: NONE SEEN
Bilirubin Urine: NEGATIVE
Glucose, UA: >=500 mg/dL — AB
Hgb urine dipstick: NEGATIVE
Ketones, ur: NEGATIVE mg/dL
Leukocytes,Ua: NEGATIVE
Nitrite: NEGATIVE
Protein, ur: NEGATIVE mg/dL
Specific Gravity, Urine: 1.03 (ref 1.005–1.030)
pH: 5 (ref 5.0–8.0)

## 2022-06-15 LAB — CBC
HCT: 51.3 % (ref 39.0–52.0)
Hemoglobin: 16.8 g/dL (ref 13.0–17.0)
MCH: 31.2 pg (ref 26.0–34.0)
MCHC: 32.7 g/dL (ref 30.0–36.0)
MCV: 95.2 fL (ref 80.0–100.0)
Platelets: 324 10*3/uL (ref 150–400)
RBC: 5.39 MIL/uL (ref 4.22–5.81)
RDW: 12.5 % (ref 11.5–15.5)
WBC: 7.8 10*3/uL (ref 4.0–10.5)
nRBC: 0 % (ref 0.0–0.2)

## 2022-06-15 LAB — GLUCOSE, CAPILLARY
Glucose-Capillary: 223 mg/dL — ABNORMAL HIGH (ref 70–99)
Glucose-Capillary: 180 mg/dL — ABNORMAL HIGH (ref 70–99)
Glucose-Capillary: 228 mg/dL — ABNORMAL HIGH (ref 70–99)
Glucose-Capillary: 211 mg/dL — ABNORMAL HIGH (ref 70–99)

## 2022-06-15 LAB — PROCALCITONIN: Procalcitonin: <0.1 ng/mL

## 2022-06-15 MED ORDER — INSULIN GLARGINE-YFGN 100 UNIT/ML ~~LOC~~ SOLN
15.0000 [IU] | Freq: Every day | SUBCUTANEOUS | Status: DC
  Administered 2022-06-15 – 2022-06-19 (×5): 15 [IU] via SUBCUTANEOUS
  Filled 2022-06-15 (×5): qty 0.15

## 2022-06-15 MED ORDER — SODIUM CHLORIDE 0.9 % IV SOLN
2.0000 g | INTRAVENOUS | Status: DC
  Administered 2022-06-15 – 2022-06-18 (×4): 2 g via INTRAVENOUS
  Filled 2022-06-15 (×4): qty 20

## 2022-06-15 MED ORDER — SODIUM CHLORIDE 0.9 % IV SOLN
500.0000 mg | INTRAVENOUS | Status: DC
  Administered 2022-06-15 – 2022-06-18 (×4): 500 mg via INTRAVENOUS
  Filled 2022-06-15 (×4): qty 5

## 2022-06-15 MED ORDER — BUDESONIDE 0.5 MG/2ML IN SUSP
0.5000 mg | Freq: Two times a day (BID) | RESPIRATORY_TRACT | Status: DC
  Administered 2022-06-16 – 2022-06-19 (×7): 0.5 mg via RESPIRATORY_TRACT
  Filled 2022-06-15 (×7): qty 2

## 2022-06-15 MED ORDER — BUDESONIDE 0.5 MG/2ML IN SUSP: 0.5000 mg | Freq: Two times a day (BID) | RESPIRATORY_TRACT | Status: DC

## 2022-06-15 MED ORDER — GUAIFENESIN ER 600 MG PO TB12
1200.0000 mg | ORAL_TABLET | Freq: Two times a day (BID) | ORAL | Status: DC
  Administered 2022-06-15 – 2022-06-19 (×9): 1200 mg via ORAL
  Filled 2022-06-15 (×9): qty 2

## 2022-06-15 NOTE — TOC Initial Note (Signed)
Transition of Care [facility]) - Initial/Assessment Note    Patient Details  Name: Rodricus Candelaria MRN: [id number] Date of Birth: 1965/09/30  [facility]) CM/SW Contact:    Dessa Phi, RN Phone Number: 06/15/2022, 3:12 PM  Clinical Narrative:  d/c plan home.                 Expected Discharge Plan: Home/Self Care Barriers to Discharge: Continued Medical Work up   Patient Goals and CMS Choice Patient states their goals for this hospitalization and ongoing recovery are::  (Home) CMS Medicare.gov Compare Post Acute Care list provided to:: Patient Choice offered to / list presented to : NA  Expected Discharge Plan and Services Expected Discharge Plan: Home/Self Care   Discharge Planning Services: CM Consult Post Acute Care Choice:  (Home) Living arrangements for the past 2 months: Single Family Home                                      Prior Living Arrangements/Services Living arrangements for the past 2 months: Single Family Home Lives with:: Spouse Patient language and need for interpreter reviewed:: Yes Do you feel safe going back to the place where you live?: Yes      Need for Family Participation in Patient Care: Yes (Comment) Care giver support system in place?: Yes (comment)   Criminal Activity/Legal Involvement Pertinent to Current Situation/Hospitalization: No - Comment as needed  Activities of Daily Living Home Assistive Devices/Equipment: Eyeglasses ADL Screening (condition at time of admission) Patient's cognitive ability adequate to safely complete daily activities?: Yes Is the patient deaf or have difficulty hearing?: No Does the patient have difficulty seeing, even when wearing glasses/contacts?: No Does the patient have difficulty concentrating, remembering, or making decisions?: No Patient able to express need for assistance with ADLs?: Yes Does the patient have difficulty dressing or bathing?: No Independently performs ADLs?: Yes  (appropriate for developmental age) Does the patient have difficulty walking or climbing stairs?: No Weakness of Legs: None Weakness of Arms/Hands: None  Permission Sought/Granted Permission sought to share information with : Case Manager Permission granted to share information with : Yes, Verbal Permission Granted  Share Information with NAME:  (Case Manager)           Emotional Assessment Appearance:: Appears stated age Attitude/Demeanor/Rapport: Gracious Affect (typically observed): Accepting Orientation: : Oriented to Self, Oriented to Place, Oriented to  Time, Oriented to Situation Alcohol / Substance Use: Not Applicable Psych Involvement: No (comment)  Admission diagnosis:  Hypoxia [R09.02] Acute respiratory failure with hypoxia ([place]) [J96.01] Community acquired pneumonia of right lower lobe of lung [J18.9] Patient Active Problem List   Diagnosis Date Noted   GERD (gastroesophageal reflux disease) 06/14/2022   DOE (dyspnea on exertion) 06/11/2022   Chronic cough 06/11/2022   Malignant neoplasm of left kidney ([place]) 05/02/2022   Renal mass 03/12/2022   Preop cardiovascular exam 02/07/2022   Chronic diastolic CHF (congestive heart failure) ([place]) 01/02/2022   Acute respiratory failure with hypoxia ([place]) 01/01/2022   Essential hypertension 01/01/2022   Thoracic aortic aneurysm ([place]) 01/01/2022   HLD (hyperlipidemia) 01/01/2022   Left renal mass 01/01/2022   Diabetes mellitus without complication ([place])    PCP:  [name], MD Pharmacy:   [id number] [place] [id number]: [phone]: [phone] [facility] [address] [place] [address] Phone: [phone] Fax: [phone]     Social Determinants of Health (SDOH) Interventions    Readmission Risk Interventions     No data to display

## 2022-06-15 NOTE — Progress Notes (Addendum)
Mobility Specialist - Progress Note  Pre-mobility: 80 bpm HR, 91% SpO2 ( 7L O2 ) During mobility: 90 bpm HR, 90% SpO2 ( 6L O2 ) Post-mobility: 83 bpm HR, 93% SPO2 ( 7L O2 )   06/15/22 1202  Mobility  HOB Elevated/Bed Position Self regulated  Activity Ambulated independently in hallway  Range of Motion/Exercises Active  Level of Assistance Independent  Assistive Device None  Distance Ambulated (ft) 1500 ft  Activity Response Tolerated well  $Mobility charge 1 Mobility   Pt was found in bed and agreeable to ambulate. Had no complaints while ambulating and SpO2 did not go lower than 89% while ambulating and always went back up within a second. At EOS returned to EOB with all necessities in reach.  Ferd Hibbs Mobility Specialist

## 2022-06-15 NOTE — Progress Notes (Signed)
PROGRESS NOTE  Russell Burns  XTA:[id number] DOB: 11-Apr-1966 DOA: 06/14/2022 PCP: [name], MD   Brief Narrative:  Patient is a 56 year old male with history of renal cell carcinoma on Keytruda, thoracic aortic aneurysm, hyperlipidemia, hypertension, diabetes type 2 presented with 1 week history of shortness of breath, cough.  Patient was also recently prescribed steroids by his pulmonologist for suspicion of COPD.  On presentation he was hemodynamically stable.  Required 2 to 5 L of oxygen for maintenance of saturation.  Chest x-ray did not show any acute findings.  CT PE study was negative for PE but showed bilateral bronchial wall thickening, scattered bibasilar consolidation, mucous plugging.  Imaging suspicious for postobstructive pneumonia.  Patient was started on broad spectrum antibiotics, bronchodilators.  Assessment & Plan:  Principal Problem:   Acute respiratory failure with hypoxia (HCC) Active Problems:   Diabetes mellitus without complication (HCC)   Essential hypertension   HLD (hyperlipidemia)   Chronic diastolic CHF (congestive heart failure) (HCC)   Malignant neoplasm of left kidney (HCC)   GERD (gastroesophageal reflux disease)   Acute hypoxic respiratory failure: Presented with shortness of breath, cough.  Not on oxygen at home.  Required 2 to 5 L of oxygen on presentation.  Patient was started on short course of steroids from outpatient pulmonology without improvement. Chest x-ray did not show any acute findings.  CT PE study was negative for PE but showed bilateral bronchial wall thickening, scattered bibasilar consolidation, mucous plugging.  Imaging suspicious for postobstructive pneumonia.  Patient was started on broad spectrum antibiotics, bronchodilators.  COVID, respiratory viral panel negative.  Patient is bieng treated for renal cell carcinoma so there is possibility of pneumonitis as well.  Being work-up for diagnosis of COPD as an outpatient.  Imaging  showed bronchial wall thickening.  Continue steroids, bronchodilators, antibiotics.  Procalcitonin non-reassuring. This morning he was on 7 L.Continue to wean oxygen as tolerated.  Renal cell carcinoma: Currently on Keytruda, follows with Dr. Alen Blew.  Status post left nephrectomy.  Pathology showed clear-cell renal cell carcinoma.  Continue home tamsulosin  History of thoracic aortic aneurysm: Stable.  Imaging showed 4.6 cm ascending thoracic aortic aneurysm.  Semiannual follow-up with CTA/MRI recommended.  He needs to follow-up with vascular surgery as an outpatient.  He is scheduled to follow-up with Dr. Roxan Hockey in 6 months  History of diabetes type 2: Monitor blood sugars.  Continue sliding scale  insulin  Hypertension: Continue losartan, amlodipine.  Monitor blood pressure  Hyperlipidemia: Continue Lipitor, Zetia  History of diastolic CHF: Last echo showed EF of 55 to 65%, grade 1 diastolic dysfunction.  Currently not on diuretics.  GERD: Continue PPI  Obesity: BMI of 33.8       DVT prophylaxis:enoxaparin (LOVENOX) injection 40 mg Start: 06/14/22 2200     Code Status: Full Code  Family Communication: None at bedside  Patient status:Inpatient  Patient is from :Home  Anticipated discharge VV:ZSMO  Estimated DC date:2-3 days   Consultants: None  Procedures:None  Antimicrobials:  Anti-infectives (From admission, onward)    Start     Dose/Rate Route Frequency Ordered Stop   06/14/22 1815  cefTRIAXone (ROCEPHIN) 2 g in sodium chloride 0.9 % 100 mL IVPB        2 g 200 mL/hr over 30 Minutes Intravenous  Once 06/14/22 1812 06/14/22 1900   06/14/22 1815  azithromycin (ZITHROMAX) 500 mg in sodium chloride 0.9 % 250 mL IVPB        500 mg 250 mL/hr over 60 Minutes  Intravenous  Once 06/14/22 1812 06/14/22 2100       Subjective:  Patient seen and examined the bedside today.  Hemodynamically stable.  On 7 L of oxygen.  Complains of some shortness of breath, cough.   Not in severe respiratory distress.  Speaking in full sentences.  Objective: Vitals:   06/14/22 2024 06/14/22 2348 06/15/22 0348 06/15/22 0804  BP:  (!) 140/91 125/76   Pulse:  94 77   Resp:  18 20   Temp:  98.1 F (36.7 C) 97.6 F (36.4 C)   TempSrc:  Oral Oral   SpO2: 91% 93% 96% 95%  Weight:      Height:        Intake/Output Summary (Last 24 hours) at 06/15/2022 6387 Last data filed at 06/15/2022 0500 Gross per 24 hour  Intake --  Output 800 ml  Net -800 ml   Filed Weights   06/14/22 1402 06/14/22 2015  Weight: 106.6 kg 106.9 kg    Examination:  General exam: Overall comfortable, not in distress HEENT: PERRL Respiratory system: Bilateral rhonchi, wheezing, coarse breathing sounds Cardiovascular system: S1 & S2 heard, RRR.  Gastrointestinal system: Abdomen is nondistended, soft and nontender. Central nervous system: Alert and oriented Extremities: No edema, no clubbing ,no cyanosis Skin: No rashes, no ulcers,no icterus     Data Reviewed: I have personally reviewed following labs and imaging studies  CBC: Recent Labs  Lab 06/11/22 1600 06/14/22 1440 06/15/22 0511  WBC 8.0 10.1 7.8  NEUTROABS 3.7 6.4  --   HGB 16.6 17.3* 16.8  HCT 49.6 51.4 51.3  MCV 94.5 93.1 95.2  PLT 255.0 327 564   Basic Metabolic Panel: Recent Labs  Lab 06/11/22 1600 06/14/22 1440 06/15/22 0511  NA 136 136 137  K 4.4 3.6 4.9  CL 101 101 100  CO2 '26 25 26  '$ GLUCOSE 111* 141* 191*  BUN 16 21* 25*  CREATININE 1.26 1.21 1.30*  CALCIUM 9.7 9.5 9.4     Recent Results (from the past 240 hour(s))  SARS Coronavirus 2 by RT PCR (hospital order, performed in [facility] hospital lab) *cepheid single result test* Anterior Nasal Swab     Status: None   Collection Time: 06/14/22  6:44 PM   Specimen: Anterior Nasal Swab  Result Value Ref Range Status   SARS Coronavirus 2 by RT PCR NEGATIVE NEGATIVE Final    Comment: (NOTE) SARS-CoV-2 target nucleic acids are NOT DETECTED.  The  SARS-CoV-2 RNA is generally detectable in upper and lower respiratory specimens during the acute phase of infection. The lowest concentration of SARS-CoV-2 viral copies this assay can detect is 250 copies / mL. A negative result does not preclude SARS-CoV-2 infection and should not be used as the sole basis for treatment or other patient management decisions.  A negative result may occur with improper specimen collection / handling, submission of specimen other than nasopharyngeal swab, presence of viral mutation(s) within the areas targeted by this assay, and inadequate number of viral copies (<250 copies / mL). A negative result must be combined with clinical observations, patient history, and epidemiological information.  Fact Sheet for Patients:   https://www.patel.info/  Fact Sheet for Healthcare Providers: https://hall.com/  This test is not yet approved or  cleared by the [place] FDA and has been authorized for detection and/or diagnosis of SARS-CoV-2 by FDA under an Emergency Use Authorization (EUA).  This EUA will remain in effect (meaning this test can be used) for the duration of the  COVID-19 declaration under Section 564(b)(1) of the Act, 21 U.S.C. section 360bbb-3(b)(1), unless the authorization is terminated or revoked sooner.  Performed at [facility], [place] [address] [place], [place] [address]   Respiratory (~20 pathogens) panel by PCR     Status: None   Collection Time: 06/14/22  6:44 PM   Specimen: Nasopharyngeal Swab; Respiratory  Result Value Ref Range Status   Adenovirus NOT DETECTED NOT DETECTED Final   Coronavirus 229E NOT DETECTED NOT DETECTED Final    Comment: (NOTE) The Coronavirus on the Respiratory Panel, DOES NOT test for the novel  Coronavirus (2019 nCoV)    Coronavirus HKU1 NOT DETECTED NOT DETECTED Final   Coronavirus NL63 NOT DETECTED NOT DETECTED Final   Coronavirus OC43 NOT  DETECTED NOT DETECTED Final   Metapneumovirus NOT DETECTED NOT DETECTED Final   Rhinovirus / Enterovirus NOT DETECTED NOT DETECTED Final   Influenza A NOT DETECTED NOT DETECTED Final   Influenza B NOT DETECTED NOT DETECTED Final   Parainfluenza Virus 1 NOT DETECTED NOT DETECTED Final   Parainfluenza Virus 2 NOT DETECTED NOT DETECTED Final   Parainfluenza Virus 3 NOT DETECTED NOT DETECTED Final   Parainfluenza Virus 4 NOT DETECTED NOT DETECTED Final   Respiratory Syncytial Virus NOT DETECTED NOT DETECTED Final   Bordetella pertussis NOT DETECTED NOT DETECTED Final   Bordetella Parapertussis NOT DETECTED NOT DETECTED Final   Chlamydophila pneumoniae NOT DETECTED NOT DETECTED Final   Mycoplasma pneumoniae NOT DETECTED NOT DETECTED Final    Comment: Performed at [facility], [place]. [address], [place], [place] [address]     Radiology Studies: CT Angio Chest PE W/Cm &/Or Wo Cm  Result Date: 06/14/2022 CLINICAL DATA:  Short of breath for 1 week, history of left renal cell carcinoma, recently began immunotherapy, positive D-dimer EXAM: CT ANGIOGRAPHY CHEST WITH CONTRAST TECHNIQUE: Multidetector CT imaging of the chest was performed using the standard protocol during bolus administration of intravenous contrast. Multiplanar CT image reconstructions and MIPs were obtained to evaluate the vascular anatomy. RADIATION DOSE REDUCTION: This exam was performed according to the departmental dose-optimization program which includes automated exposure control, adjustment of the mA and/or kV according to patient size and/or use of iterative reconstruction technique. CONTRAST:  53m OMNIPAQUE IOHEXOL 350 MG/ML SOLN COMPARISON:  06/14/2022, 01/01/2022 FINDINGS: Cardiovascular: This is a technically adequate evaluation of the pulmonary vasculature. No filling defects or pulmonary emboli. The heart is unremarkable without pericardial effusion. Stable calcifications of the aortic valve. Stable 4.6 cm ascending  thoracic aortic aneurysm. No evidence of dissection. Mediastinum/Nodes: No enlarged mediastinal, hilar, or axillary lymph nodes. Thyroid gland, trachea, and esophagus demonstrate no significant findings. Lungs/Pleura: There is bilateral bronchial wall thickening with scattered areas of mucous plugging in the lower lobes. Patchy consolidation within the lung bases may reflect a combination of airspace disease and atelectasis. No effusion or pneumothorax. Upper Abdomen: No acute abnormality. Musculoskeletal: No acute or destructive bony lesions. Reconstructed images demonstrate no additional findings. Review of the MIP images confirms the above findings. IMPRESSION: 1. No evidence of pulmonary embolus. 2. Bilateral bronchial wall thickening, with scattered areas of bibasilar consolidation and mucous plugging. Findings could reflect postobstructive changes or pneumonia, in a more pronounced than on preceding exam. 3. Stable 4.6 cm ascending thoracic aortic aneurysm. Ascending thoracic aortic aneurysm. Recommend semi-annual imaging followup by CTA or MRA and referral to cardiothoracic surgery if not already obtained. This recommendation follows 2010 ACCF/AHA/AATS/ACR/ASA/SCA/SCAI/SIR/STS/SVM Guidelines for the Diagnosis and Management of Patients With Thoracic  Aortic Disease. Circulation. 2010; 121: K481-E563. Aortic aneurysm NOS (ICD10-I71.9) 4.  Aortic Atherosclerosis (ICD10-I70.0). Electronically Signed   By: Randa Ngo M.D.   On: 06/14/2022 17:37   DG Chest 1 View  Result Date: 06/14/2022 CLINICAL DATA:  Shortness of breath. EXAM: CHEST  1 VIEW COMPARISON:  Radiographs 06/11/2022 and 01/01/2022.  CT 01/01/2022. FINDINGS: 1457 hours. Lordotic positioning. The heart size and mediastinal contours are normal. The lungs are clear. There is no pleural effusion or pneumothorax. No acute osseous findings are identified. Telemetry leads overlie the chest. IMPRESSION: No evidence of active cardiopulmonary process.  Electronically Signed   By: Richardean Sale M.D.   On: 06/14/2022 15:48    Scheduled Meds:  amLODipine  10 mg Oral Daily   atorvastatin  80 mg Oral Daily   enoxaparin (LOVENOX) injection  40 mg Subcutaneous Q24H   ezetimibe  10 mg Oral Daily   insulin aspart  0-15 Units Subcutaneous TID WC   ipratropium-albuterol  3 mL Nebulization Q6H   losartan  100 mg Oral Daily   pantoprazole  40 mg Oral Daily   predniSONE  50 mg Oral Q breakfast   sodium chloride flush  3 mL Intravenous Q12H   tamsulosin  0.4 mg Oral Daily   Continuous Infusions:   LOS: 0 days   Shelly Coss, MD [facility] P9/22/2023, 8:21 AM

## 2022-06-16 ENCOUNTER — Encounter: Payer: Self-pay | Admitting: Internal Medicine

## 2022-06-16 DIAGNOSIS — J9601 Acute respiratory failure with hypoxia: Secondary | ICD-10-CM | POA: Diagnosis not present

## 2022-06-16 LAB — BASIC METABOLIC PANEL
Anion gap: 10 (ref 5–15)
BUN: 28 mg/dL — ABNORMAL HIGH (ref 6–20)
CO2: 25 mmol/L (ref 22–32)
Calcium: 9.2 mg/dL (ref 8.9–10.3)
Chloride: 104 mmol/L (ref 98–111)
Creatinine, Ser: 1.44 mg/dL — ABNORMAL HIGH (ref 0.61–1.24)
GFR, Estimated: 57 mL/min — ABNORMAL LOW (ref >60)
Glucose, Bld: 142 mg/dL — ABNORMAL HIGH (ref 70–99)
Potassium: 4.4 mmol/L (ref 3.5–5.1)
Sodium: 139 mmol/L (ref 135–145)

## 2022-06-16 LAB — CBC
HCT: 49.3 % (ref 39.0–52.0)
Hemoglobin: 16.1 g/dL (ref 13.0–17.0)
MCH: 31.4 pg (ref 26.0–34.0)
MCHC: 32.7 g/dL (ref 30.0–36.0)
MCV: 96.3 fL (ref 80.0–100.0)
Platelets: 302 10*3/uL (ref 150–400)
RBC: 5.12 MIL/uL (ref 4.22–5.81)
RDW: 12.6 % (ref 11.5–15.5)
WBC: 8.6 10*3/uL (ref 4.0–10.5)
nRBC: 0 % (ref 0.0–0.2)

## 2022-06-16 LAB — ALPHA-1-ANTITRYPSIN PHENOTYP: A-1 Antitrypsin: 129 mg/dL (ref 101–187)

## 2022-06-16 LAB — GLUCOSE, CAPILLARY
Glucose-Capillary: 210 mg/dL — ABNORMAL HIGH (ref 70–99)
Glucose-Capillary: 144 mg/dL — ABNORMAL HIGH (ref 70–99)
Glucose-Capillary: 244 mg/dL — ABNORMAL HIGH (ref 70–99)
Glucose-Capillary: 221 mg/dL — ABNORMAL HIGH (ref 70–99)

## 2022-06-16 NOTE — Progress Notes (Signed)
PROGRESS NOTE  Russell Burns  ZOX:[id number] DOB: May 19, 1966 DOA: 06/14/2022 PCP: [name], MD   Brief Narrative:  Patient is a 56 year old male with history of renal cell carcinoma on Keytruda, thoracic aortic aneurysm, hyperlipidemia, hypertension, diabetes type 2 presented with 1 week history of shortness of breath, cough.  Patient was also recently prescribed steroids by his pulmonologist for suspicion of COPD.  On presentation he was hemodynamically stable.  Required 2 to 5 L of oxygen for maintenance of saturation.  Chest x-ray did not show any acute findings.  CT PE study was negative for PE but showed bilateral bronchial wall thickening, scattered bibasilar consolidation, mucous plugging.  Imaging suspicious for postobstructive pneumonia.  Patient was started on broad spectrum antibiotics, bronchodilators.  Assessment & Plan:  Principal Problem:   Acute respiratory failure with hypoxia (HCC) Active Problems:   Diabetes mellitus without complication (HCC)   Essential hypertension   HLD (hyperlipidemia)   Chronic diastolic CHF (congestive heart failure) (HCC)   Malignant neoplasm of left kidney (HCC)   GERD (gastroesophageal reflux disease)   Acute hypoxic respiratory failure: Presented with shortness of breath, cough.  Not on oxygen at home.  Required 2 to 5 L of oxygen on presentation.  Patient was started on short course of steroids from outpatient pulmonology without improvement. Chest x-ray did not show any acute findings.  CT PE study was negative for PE but showed bilateral bronchial wall thickening, scattered bibasilar consolidation, mucous plugging.  Imaging suspicious for postobstructive pneumonia.  Patient was started on broad spectrum antibiotics, bronchodilators.  COVID, respiratory viral panel negative.  Patient is bieng treated for renal cell carcinoma so there is possibility of pneumonitis as well.  Being work-up for diagnosis of COPD as an outpatient.  Imaging  showed bronchial wall thickening.  Continue steroids, bronchodilators, antibiotics.  Procalcitonin non-reassuring. This morning he was  still on 7 L.Continue to wean oxygen as tolerated. Symptomatically, he feels much better today.  We will continue current management for now.  We will consult pulmonary service if no further improvement in the respiratory status.  Renal cell carcinoma: Currently on Keytruda, follows with Dr. Alen Blew.  Status post left nephrectomy.  Pathology showed clear-cell renal cell carcinoma.  Continue home tamsulosin  History of thoracic aortic aneurysm: Stable.  Imaging showed 4.6 cm ascending thoracic aortic aneurysm.  Semiannual follow-up with CTA/MRI recommended.  He needs to follow-up with vascular surgery as an outpatient.  He is scheduled to follow-up with Dr. Roxan Hockey in 6 months  History of diabetes type 2: Monitor blood sugars.  Continue sliding scale  insulin  Hypertension: Continue losartan, amlodipine.  Monitor blood pressure  Hyperlipidemia: Continue Lipitor, Zetia  History of diastolic CHF: Last echo showed EF of 55 to 81%, grade 1 diastolic dysfunction.  Currently not on diuretics.  GERD: Continue PPI  Obesity: BMI of 33.8       DVT prophylaxis:enoxaparin (LOVENOX) injection 40 mg Start: 06/14/22 2200     Code Status: Full Code  Family Communication: None at bedside  Patient status:Inpatient  Patient is from :Home  Anticipated discharge XB:JYNW  Estimated DC date:2-3 days   Consultants: None  Procedures:None  Antimicrobials:  Anti-infectives (From admission, onward)    Start     Dose/Rate Route Frequency Ordered Stop   06/15/22 1800  azithromycin (ZITHROMAX) 500 mg in sodium chloride 0.9 % 250 mL IVPB        500 mg 250 mL/hr over 60 Minutes Intravenous Every 24 hours 06/15/22 0826  06/15/22 1700  cefTRIAXone (ROCEPHIN) 2 g in sodium chloride 0.9 % 100 mL IVPB        2 g 200 mL/hr over 30 Minutes Intravenous Every 24 hours  06/15/22 0826     06/14/22 1815  cefTRIAXone (ROCEPHIN) 2 g in sodium chloride 0.9 % 100 mL IVPB        2 g 200 mL/hr over 30 Minutes Intravenous  Once 06/14/22 1812 06/14/22 1900   06/14/22 1815  azithromycin (ZITHROMAX) 500 mg in sodium chloride 0.9 % 250 mL IVPB        500 mg 250 mL/hr over 60 Minutes Intravenous  Once 06/14/22 1812 06/14/22 2100       Subjective:  Patient seen and examined at the bedside today.  Hemodynamically stable.  Looks better.  Looks more energetic and speaking in full sentences.  Cough is better.  Still on 7 L of oxygen.  Wants to take a shower Objective: Vitals:   06/16/22 0259 06/16/22 0546 06/16/22 0556 06/16/22 0906  BP:  (!) 142/84  (!) 158/86  Pulse:  73  80  Resp:  19    Temp:  (!) 97.5 F (36.4 C)    TempSrc:  Oral    SpO2: 94% 93% 95% 97%  Weight:      Height:        Intake/Output Summary (Last 24 hours) at 06/16/2022 1136 Last data filed at 06/15/2022 2000 Gross per 24 hour  Intake 350 ml  Output --  Net 350 ml   Filed Weights   06/14/22 1402 06/14/22 2015  Weight: 106.6 kg 106.9 kg    Examination:  General exam: Overall comfortable, not in distress,obese HEENT: PERRL Respiratory system: Lungs sound better than yesterday.  Few scattered rhonchi, diminished air sound bilaterally Cardiovascular system: S1 & S2 heard, RRR.  Gastrointestinal system: Abdomen is nondistended, soft and nontender. Central nervous system: Alert and oriented Extremities: No edema, no clubbing ,no cyanosis Skin: No rashes, no ulcers,no icterus     Data Reviewed: I have personally reviewed following labs and imaging studies  CBC: Recent Labs  Lab 06/11/22 1600 06/14/22 1440 06/15/22 0511 06/16/22 0512  WBC 8.0 10.1 7.8 8.6  NEUTROABS 3.7 6.4  --   --   HGB 16.6 17.3* 16.8 16.1  HCT 49.6 51.4 51.3 49.3  MCV 94.5 93.1 95.2 96.3  PLT 255.0 327 324 166   Basic Metabolic Panel: Recent Labs  Lab 06/11/22 1600 06/14/22 1440 06/15/22 0511  06/16/22 0512  NA 136 136 137 139  K 4.4 3.6 4.9 4.4  CL 101 101 100 104  CO2 '26 25 26 25  '$ GLUCOSE 111* 141* 191* 142*  BUN 16 21* 25* 28*  CREATININE 1.26 1.21 1.30* 1.44*  CALCIUM 9.7 9.5 9.4 9.2     Recent Results (from the past 240 hour(s))  SARS Coronavirus 2 by RT PCR (hospital order, performed in [facility] hospital lab) *cepheid single result test* Anterior Nasal Swab     Status: None   Collection Time: 06/14/22  6:44 PM   Specimen: Anterior Nasal Swab  Result Value Ref Range Status   SARS Coronavirus 2 by RT PCR NEGATIVE NEGATIVE Final    Comment: (NOTE) SARS-CoV-2 target nucleic acids are NOT DETECTED.  The SARS-CoV-2 RNA is generally detectable in upper and lower respiratory specimens during the acute phase of infection. The lowest concentration of SARS-CoV-2 viral copies this assay can detect is 250 copies / mL. A negative result does not preclude SARS-CoV-2 infection  and should not be used as the sole basis for treatment or other patient management decisions.  A negative result may occur with improper specimen collection / handling, submission of specimen other than nasopharyngeal swab, presence of viral mutation(s) within the areas targeted by this assay, and inadequate number of viral copies (<250 copies / mL). A negative result must be combined with clinical observations, patient history, and epidemiological information.  Fact Sheet for Patients:   https://www.patel.info/  Fact Sheet for Healthcare Providers: https://hall.com/  This test is not yet approved or  cleared by the [place] FDA and has been authorized for detection and/or diagnosis of SARS-CoV-2 by FDA under an Emergency Use Authorization (EUA).  This EUA will remain in effect (meaning this test can be used) for the duration of the COVID-19 declaration under Section 564(b)(1) of the Act, 21 U.S.C. section 360bbb-3(b)(1), unless the authorization is  terminated or revoked sooner.  Performed at [facility], [place] [address] [place], [place] [address]   Respiratory (~20 pathogens) panel by PCR     Status: None   Collection Time: 06/14/22  6:44 PM   Specimen: Nasopharyngeal Swab; Respiratory  Result Value Ref Range Status   Adenovirus NOT DETECTED NOT DETECTED Final   Coronavirus 229E NOT DETECTED NOT DETECTED Final    Comment: (NOTE) The Coronavirus on the Respiratory Panel, DOES NOT test for the novel  Coronavirus (2019 nCoV)    Coronavirus HKU1 NOT DETECTED NOT DETECTED Final   Coronavirus NL63 NOT DETECTED NOT DETECTED Final   Coronavirus OC43 NOT DETECTED NOT DETECTED Final   Metapneumovirus NOT DETECTED NOT DETECTED Final   Rhinovirus / Enterovirus NOT DETECTED NOT DETECTED Final   Influenza A NOT DETECTED NOT DETECTED Final   Influenza B NOT DETECTED NOT DETECTED Final   Parainfluenza Virus 1 NOT DETECTED NOT DETECTED Final   Parainfluenza Virus 2 NOT DETECTED NOT DETECTED Final   Parainfluenza Virus 3 NOT DETECTED NOT DETECTED Final   Parainfluenza Virus 4 NOT DETECTED NOT DETECTED Final   Respiratory Syncytial Virus NOT DETECTED NOT DETECTED Final   Bordetella pertussis NOT DETECTED NOT DETECTED Final   Bordetella Parapertussis NOT DETECTED NOT DETECTED Final   Chlamydophila pneumoniae NOT DETECTED NOT DETECTED Final   Mycoplasma pneumoniae NOT DETECTED NOT DETECTED Final    Comment: Performed at [facility], [place]. [address], [place], [place] [address]  MRSA Next Gen by PCR, Nasal     Status: None   Collection Time: 06/15/22 10:30 AM   Specimen: Nasal Mucosa; Nasal Swab  Result Value Ref Range Status   MRSA by PCR Next Gen NOT DETECTED NOT DETECTED Final    Comment: (NOTE) The GeneXpert MRSA Assay (FDA approved for NASAL specimens only), is one component of a comprehensive MRSA colonization surveillance program. It is not intended to diagnose MRSA infection nor to guide or monitor treatment  for MRSA infections. Test performance is not FDA approved in patients less than 17 years old. Performed at [facility], [place] [address] [place], [place] [address]      Radiology Studies: CT Angio Chest PE W/Cm &/Or Wo Cm  Result Date: 06/14/2022 CLINICAL DATA:  Short of breath for 1 week, history of left renal cell carcinoma, recently began immunotherapy, positive D-dimer EXAM: CT ANGIOGRAPHY CHEST WITH CONTRAST TECHNIQUE: Multidetector CT imaging of the chest was performed using the standard protocol during bolus administration of intravenous contrast. Multiplanar CT image reconstructions and MIPs were obtained to evaluate the vascular  anatomy. RADIATION DOSE REDUCTION: This exam was performed according to the departmental dose-optimization program which includes automated exposure control, adjustment of the mA and/or kV according to patient size and/or use of iterative reconstruction technique. CONTRAST:  39m OMNIPAQUE IOHEXOL 350 MG/ML SOLN COMPARISON:  06/14/2022, 01/01/2022 FINDINGS: Cardiovascular: This is a technically adequate evaluation of the pulmonary vasculature. No filling defects or pulmonary emboli. The heart is unremarkable without pericardial effusion. Stable calcifications of the aortic valve. Stable 4.6 cm ascending thoracic aortic aneurysm. No evidence of dissection. Mediastinum/Nodes: No enlarged mediastinal, hilar, or axillary lymph nodes. Thyroid gland, trachea, and esophagus demonstrate no significant findings. Lungs/Pleura: There is bilateral bronchial wall thickening with scattered areas of mucous plugging in the lower lobes. Patchy consolidation within the lung bases may reflect a combination of airspace disease and atelectasis. No effusion or pneumothorax. Upper Abdomen: No acute abnormality. Musculoskeletal: No acute or destructive bony lesions. Reconstructed images demonstrate no additional findings. Review of the MIP images confirms the above findings.  IMPRESSION: 1. No evidence of pulmonary embolus. 2. Bilateral bronchial wall thickening, with scattered areas of bibasilar consolidation and mucous plugging. Findings could reflect postobstructive changes or pneumonia, in a more pronounced than on preceding exam. 3. Stable 4.6 cm ascending thoracic aortic aneurysm. Ascending thoracic aortic aneurysm. Recommend semi-annual imaging followup by CTA or MRA and referral to cardiothoracic surgery if not already obtained. This recommendation follows 2010 ACCF/AHA/AATS/ACR/ASA/SCA/SCAI/SIR/STS/SVM Guidelines for the Diagnosis and Management of Patients With Thoracic Aortic Disease. Circulation. 2010; 121:: N361-W431 Aortic aneurysm NOS (ICD10-I71.9) 4.  Aortic Atherosclerosis (ICD10-I70.0). Electronically Signed   By: MRanda NgoM.D.   On: 06/14/2022 17:37   DG Chest 1 View  Result Date: 06/14/2022 CLINICAL DATA:  Shortness of breath. EXAM: CHEST  1 VIEW COMPARISON:  Radiographs 06/11/2022 and 01/01/2022.  CT 01/01/2022. FINDINGS: 1457 hours. Lordotic positioning. The heart size and mediastinal contours are normal. The lungs are clear. There is no pleural effusion or pneumothorax. No acute osseous findings are identified. Telemetry leads overlie the chest. IMPRESSION: No evidence of active cardiopulmonary process. Electronically Signed   By: WRichardean SaleM.D.   On: 06/14/2022 15:48    Scheduled Meds:  amLODipine  10 mg Oral Daily   atorvastatin  80 mg Oral Daily   budesonide (PULMICORT) nebulizer solution  0.5 mg Nebulization BID   enoxaparin (LOVENOX) injection  40 mg Subcutaneous Q24H   ezetimibe  10 mg Oral Daily   guaiFENesin  1,200 mg Oral BID   insulin aspart  0-15 Units Subcutaneous TID WC   insulin glargine-yfgn  15 Units Subcutaneous Daily   ipratropium-albuterol  3 mL Nebulization Q6H   losartan  100 mg Oral Daily   pantoprazole  40 mg Oral Daily   predniSONE  50 mg Oral Q breakfast   sodium chloride flush  3 mL Intravenous Q12H    tamsulosin  0.4 mg Oral Daily   Continuous Infusions:  azithromycin (ZITHROMAX) 500 mg in sodium chloride 0.9 % 250 mL IVPB Stopped (06/15/22 2000)   cefTRIAXone (ROCEPHIN)  IV 2 g (06/15/22 1717)     LOS: 1 day   AShelly Coss MD [facility] P9/23/2023, 11:36 AM

## 2022-06-17 DIAGNOSIS — J9601 Acute respiratory failure with hypoxia: Secondary | ICD-10-CM | POA: Diagnosis not present

## 2022-06-17 LAB — GLUCOSE, CAPILLARY
Glucose-Capillary: 183 mg/dL — ABNORMAL HIGH (ref 70–99)
Glucose-Capillary: 187 mg/dL — ABNORMAL HIGH (ref 70–99)
Glucose-Capillary: 297 mg/dL — ABNORMAL HIGH (ref 70–99)
Glucose-Capillary: 291 mg/dL — ABNORMAL HIGH (ref 70–99)

## 2022-06-17 LAB — BASIC METABOLIC PANEL
Anion gap: 8 (ref 5–15)
BUN: 25 mg/dL — ABNORMAL HIGH (ref 6–20)
CO2: 30 mmol/L (ref 22–32)
Calcium: 9.4 mg/dL (ref 8.9–10.3)
Chloride: 101 mmol/L (ref 98–111)
Creatinine, Ser: 1.21 mg/dL (ref 0.61–1.24)
GFR, Estimated: >60 mL/min (ref >60)
Glucose, Bld: 204 mg/dL — ABNORMAL HIGH (ref 70–99)
Potassium: 4.8 mmol/L (ref 3.5–5.1)
Sodium: 139 mmol/L (ref 135–145)

## 2022-06-17 LAB — CBC
HCT: 50.7 % (ref 39.0–52.0)
Hemoglobin: 17.1 g/dL — ABNORMAL HIGH (ref 13.0–17.0)
MCH: 32.1 pg (ref 26.0–34.0)
MCHC: 33.7 g/dL (ref 30.0–36.0)
MCV: 95.3 fL (ref 80.0–100.0)
Platelets: 326 10*3/uL (ref 150–400)
RBC: 5.32 MIL/uL (ref 4.22–5.81)
RDW: 12.6 % (ref 11.5–15.5)
WBC: 10.5 10*3/uL (ref 4.0–10.5)
nRBC: 0 % (ref 0.0–0.2)

## 2022-06-17 MED ORDER — IPRATROPIUM-ALBUTEROL 0.5-2.5 (3) MG/3ML IN SOLN
3.0000 mL | Freq: Three times a day (TID) | RESPIRATORY_TRACT | Status: DC
  Administered 2022-06-17 – 2022-06-19 (×6): 3 mL via RESPIRATORY_TRACT
  Filled 2022-06-17 (×6): qty 3

## 2022-06-17 NOTE — Progress Notes (Signed)
SATURATION QUALIFICATIONS: (This note is used to comply with regulatory documentation for home oxygen)  Patient Saturations on Room Air at Rest = 84%  Patient Saturations on Room Air while Ambulating = 83%  Patient Saturations on 4 Liters of oxygen while Ambulating = 92%  Please briefly explain why patient needs home oxygen: Pt was unable to get his oxygen up to 90%, even on 2L, oxygen had to be bump up to 4l to maintain 91-92 %. He also c/o of being windy a little when ambulating  with no oxygen.

## 2022-06-17 NOTE — Progress Notes (Signed)
PROGRESS NOTE  Russell Burns  DSK:[id number] DOB: 1965-12-22 DOA: 06/14/2022 PCP: [name], MD   Brief Narrative:  Patient is a 56 year old male with history of renal cell carcinoma on Keytruda, thoracic aortic aneurysm, hyperlipidemia, hypertension, diabetes type 2 presented with 1 week history of shortness of breath, cough.  Patient was also recently prescribed steroids by his pulmonologist for suspicion of COPD.  On presentation he was hemodynamically stable.  Required 2 to 5 L of oxygen for maintenance of saturation.  Chest x-ray did not show any acute findings.  CT PE study was negative for PE but showed bilateral bronchial wall thickening, scattered bibasilar consolidation, mucous plugging.  Imaging suspicious for postobstructive pneumonia.  Patient was started on broad spectrum antibiotics, bronchodilators.  Respiratory status significantly improving.  Assessment & Plan:  Principal Problem:   Acute respiratory failure with hypoxia (HCC) Active Problems:   Diabetes mellitus without complication (HCC)   Essential hypertension   HLD (hyperlipidemia)   Chronic diastolic CHF (congestive heart failure) (HCC)   Malignant neoplasm of left kidney (HCC)   GERD (gastroesophageal reflux disease)   Acute hypoxic respiratory failure: Presented with shortness of breath, cough.  Not on oxygen at home.  Required 2 to 5 L of oxygen on presentation.  Patient was started on short course of steroids from outpatient pulmonology without improvement. Chest x-ray did not show any acute findings.  CT PE study was negative for PE but showed bilateral bronchial wall thickening, scattered bibasilar consolidation, mucous plugging.  Imaging suspicious for postobstructive pneumonia.  Patient was started on broad spectrum antibiotics, bronchodilators.  COVID, respiratory viral panel negative.  Patient is bieng treated for renal cell carcinoma so there is possibility of pneumonitis as well.  Being work-up for  diagnosis of COPD as an outpatient.  Imaging showed bronchial wall thickening.  Continue steroids, bronchodilators, antibiotics.  Procalcitonin non-reassuring. He feels much better today.  He took himself off of oxygen and was maintaining at 90%.  We will ambulate him today and check his oxygenation.  We will continue current management.  He has requested for chest PT therapy.  Continue incentive spirometer, flutter valve  Renal cell carcinoma: Currently on Keytruda, follows with Dr. Alen Blew.  Status post left nephrectomy.  Pathology showed clear-cell renal cell carcinoma.  Continue home tamsulosin  History of thoracic aortic aneurysm: Stable.  Imaging showed 4.6 cm ascending thoracic aortic aneurysm.  Semiannual follow-up with CTA/MRI recommended.  He needs to follow-up with vascular surgery as an outpatient.  He is scheduled to follow-up with Dr. Roxan Hockey in 6 months  History of diabetes type 2: Monitor blood sugars.  Continue sliding scale  insulin  Hypertension: Continue losartan, amlodipine.  Monitor blood pressure  Hyperlipidemia: Continue Lipitor, Zetia  History of diastolic CHF: Last echo showed EF of 55 to 62%, grade 1 diastolic dysfunction.  Currently not on diuretics.  GERD: Continue PPI  Obesity: BMI of 33.8       DVT prophylaxis:enoxaparin (LOVENOX) injection 40 mg Start: 06/14/22 2200     Code Status: Full Code  Family Communication: None at bedside  Patient status:Inpatient  Patient is from :Home  Anticipated discharge MB:TDHR  Estimated DC date:tomorrow   Consultants: None  Procedures:None  Antimicrobials:  Anti-infectives (From admission, onward)    Start     Dose/Rate Route Frequency Ordered Stop   06/15/22 1800  azithromycin (ZITHROMAX) 500 mg in sodium chloride 0.9 % 250 mL IVPB        500 mg 250 mL/hr over 60  Minutes Intravenous Every 24 hours 06/15/22 0826     06/15/22 1700  cefTRIAXone (ROCEPHIN) 2 g in sodium chloride 0.9 % 100 mL IVPB         2 g 200 mL/hr over 30 Minutes Intravenous Every 24 hours 06/15/22 0826     06/14/22 1815  cefTRIAXone (ROCEPHIN) 2 g in sodium chloride 0.9 % 100 mL IVPB        2 g 200 mL/hr over 30 Minutes Intravenous  Once 06/14/22 1812 06/14/22 1900   06/14/22 1815  azithromycin (ZITHROMAX) 500 mg in sodium chloride 0.9 % 250 mL IVPB        500 mg 250 mL/hr over 60 Minutes Intravenous  Once 06/14/22 1812 06/14/22 2100       Subjective:  Patient seen and examined at the bedside today.  Hemodynamically stable.  On room air, ambulating inside the room.  Maintaining 89 to 90% on room air.  Cough is much better.  Objective: Vitals:   06/16/22 1951 06/16/22 2126 06/17/22 0220 06/17/22 0532  BP:  (!) 149/89  (!) 142/92  Pulse:  (!) 106  70  Resp:  20  18  Temp:  97.6 F (36.4 C)  97.7 F (36.5 C)  TempSrc:  Oral  Oral  SpO2: 92% 96% 96% 98%  Weight:      Height:        Intake/Output Summary (Last 24 hours) at 06/17/2022 1136 Last data filed at 06/16/2022 2223 Gross per 24 hour  Intake 483 ml  Output --  Net 483 ml   Filed Weights   06/14/22 1402 06/14/22 2015  Weight: 106.6 kg 106.9 kg    Examination:  General exam: Overall comfortable, not in distress HEENT: PERRL Respiratory system:  no wheezes or crackles  Cardiovascular system: S1 & S2 heard, RRR.  Gastrointestinal system: Abdomen is nondistended, soft and nontender. Central nervous system: Alert and oriented Extremities: No edema, no clubbing ,no cyanosis Skin: No rashes, no ulcers,no icterus       Data Reviewed: I have personally reviewed following labs and imaging studies  CBC: Recent Labs  Lab 06/11/22 1600 06/14/22 1440 06/15/22 0511 06/16/22 0512 06/17/22 0852  WBC 8.0 10.1 7.8 8.6 10.5  NEUTROABS 3.7 6.4  --   --   --   HGB 16.6 17.3* 16.8 16.1 17.1*  HCT 49.6 51.4 51.3 49.3 50.7  MCV 94.5 93.1 95.2 96.3 95.3  PLT 255.0 327 324 302 440   Basic Metabolic Panel: Recent Labs  Lab 06/11/22 1600  06/14/22 1440 06/15/22 0511 06/16/22 0512 06/17/22 0852  NA 136 136 137 139 139  K 4.4 3.6 4.9 4.4 4.8  CL 101 101 100 104 101  CO2 '26 25 26 25 30  '$ GLUCOSE 111* 141* 191* 142* 204*  BUN 16 21* 25* 28* 25*  CREATININE 1.26 1.21 1.30* 1.44* 1.21  CALCIUM 9.7 9.5 9.4 9.2 9.4     Recent Results (from the past 240 hour(s))  SARS Coronavirus 2 by RT PCR (hospital order, performed in [facility] hospital lab) *cepheid single result test* Anterior Nasal Swab     Status: None   Collection Time: 06/14/22  6:44 PM   Specimen: Anterior Nasal Swab  Result Value Ref Range Status   SARS Coronavirus 2 by RT PCR NEGATIVE NEGATIVE Final    Comment: (NOTE) SARS-CoV-2 target nucleic acids are NOT DETECTED.  The SARS-CoV-2 RNA is generally detectable in upper and lower respiratory specimens during the acute phase of infection. The lowest  concentration of SARS-CoV-2 viral copies this assay can detect is 250 copies / mL. A negative result does not preclude SARS-CoV-2 infection and should not be used as the sole basis for treatment or other patient management decisions.  A negative result may occur with improper specimen collection / handling, submission of specimen other than nasopharyngeal swab, presence of viral mutation(s) within the areas targeted by this assay, and inadequate number of viral copies (<250 copies / mL). A negative result must be combined with clinical observations, patient history, and epidemiological information.  Fact Sheet for Patients:   https://www.patel.info/  Fact Sheet for Healthcare Providers: https://hall.com/  This test is not yet approved or  cleared by the [place] FDA and has been authorized for detection and/or diagnosis of SARS-CoV-2 by FDA under an Emergency Use Authorization (EUA).  This EUA will remain in effect (meaning this test can be used) for the duration of the COVID-19 declaration under Section  564(b)(1) of the Act, 21 U.S.C. section 360bbb-3(b)(1), unless the authorization is terminated or revoked sooner.  Performed at [facility], [place] [address], [place], [place] [address]   Respiratory (~20 pathogens) panel by PCR     Status: None   Collection Time: 06/14/22  6:44 PM   Specimen: Nasopharyngeal Swab; Respiratory  Result Value Ref Range Status   Adenovirus NOT DETECTED NOT DETECTED Final   Coronavirus 229E NOT DETECTED NOT DETECTED Final    Comment: (NOTE) The Coronavirus on the Respiratory Panel, DOES NOT test for the novel  Coronavirus (2019 nCoV)    Coronavirus HKU1 NOT DETECTED NOT DETECTED Final   Coronavirus NL63 NOT DETECTED NOT DETECTED Final   Coronavirus OC43 NOT DETECTED NOT DETECTED Final   Metapneumovirus NOT DETECTED NOT DETECTED Final   Rhinovirus / Enterovirus NOT DETECTED NOT DETECTED Final   Influenza A NOT DETECTED NOT DETECTED Final   Influenza B NOT DETECTED NOT DETECTED Final   Parainfluenza Virus 1 NOT DETECTED NOT DETECTED Final   Parainfluenza Virus 2 NOT DETECTED NOT DETECTED Final   Parainfluenza Virus 3 NOT DETECTED NOT DETECTED Final   Parainfluenza Virus 4 NOT DETECTED NOT DETECTED Final   Respiratory Syncytial Virus NOT DETECTED NOT DETECTED Final   Bordetella pertussis NOT DETECTED NOT DETECTED Final   Bordetella Parapertussis NOT DETECTED NOT DETECTED Final   Chlamydophila pneumoniae NOT DETECTED NOT DETECTED Final   Mycoplasma pneumoniae NOT DETECTED NOT DETECTED Final    Comment: Performed at [facility], [place]. [address] [place], [place] [address]  MRSA Next Gen by PCR, Nasal     Status: None   Collection Time: 06/15/22 10:30 AM   Specimen: Nasal Mucosa; Nasal Swab  Result Value Ref Range Status   MRSA by PCR Next Gen NOT DETECTED NOT DETECTED Final    Comment: (NOTE) The GeneXpert MRSA Assay (FDA approved for NASAL specimens only), is one component of a comprehensive MRSA colonization  surveillance program. It is not intended to diagnose MRSA infection nor to guide or monitor treatment for MRSA infections. Test performance is not FDA approved in patients less than 108 years old. Performed at [facility], [place] [address] [place], [place] [address]      Radiology Studies: No results found.  Scheduled Meds:  amLODipine  10 mg Oral Daily   atorvastatin  80 mg Oral Daily   budesonide (PULMICORT) nebulizer solution  0.5 mg Nebulization BID   enoxaparin (LOVENOX) injection  40 mg Subcutaneous Q24H   ezetimibe  10  mg Oral Daily   guaiFENesin  1,200 mg Oral BID   insulin aspart  0-15 Units Subcutaneous TID WC   insulin glargine-yfgn  15 Units Subcutaneous Daily   ipratropium-albuterol  3 mL Nebulization TID   losartan  100 mg Oral Daily   pantoprazole  40 mg Oral Daily   predniSONE  50 mg Oral Q breakfast   sodium chloride flush  3 mL Intravenous Q12H   tamsulosin  0.4 mg Oral Daily   Continuous Infusions:  azithromycin (ZITHROMAX) 500 mg in sodium chloride 0.9 % 250 mL IVPB 500 mg (06/16/22 1924)   cefTRIAXone (ROCEPHIN)  IV 2 g (06/16/22 1755)     LOS: 2 days   Shelly Coss, MD [facility] P9/24/2023, 11:36 AM

## 2022-06-18 DIAGNOSIS — J9601 Acute respiratory failure with hypoxia: Secondary | ICD-10-CM | POA: Diagnosis not present

## 2022-06-18 LAB — GLUCOSE, CAPILLARY
Glucose-Capillary: 136 mg/dL — ABNORMAL HIGH (ref 70–99)
Glucose-Capillary: 117 mg/dL — ABNORMAL HIGH (ref 70–99)
Glucose-Capillary: 295 mg/dL — ABNORMAL HIGH (ref 70–99)
Glucose-Capillary: 411 mg/dL — ABNORMAL HIGH (ref 70–99)

## 2022-06-18 MED ORDER — INSULIN ASPART 100 UNIT/ML IJ SOLN
15.0000 [IU] | Freq: Once | INTRAMUSCULAR | Status: AC
  Administered 2022-06-18: 15 [IU] via SUBCUTANEOUS

## 2022-06-18 NOTE — Progress Notes (Signed)
PROGRESS NOTE  Russell Burns  WFU:[id number] DOB: [birth year] DOA: 06/14/2022 PCP: [name], MD   Brief Narrative:  Patient is a 56 year old male with history of renal cell carcinoma on Keytruda, thoracic aortic aneurysm, hyperlipidemia, hypertension, diabetes type 2 presented with 1 week history of shortness of breath, cough.  Patient was also recently prescribed steroids by his pulmonologist for suspicion of COPD.  On presentation he was hemodynamically stable.  Required 2 to 5 L of oxygen for maintenance of saturation.  Chest x-ray did not show any acute findings.  CT PE study was negative for PE but showed bilateral bronchial wall thickening, scattered bibasilar consolidation, mucous plugging.  Imaging suspicious for postobstructive pneumonia.  Patient was started on broad spectrum antibiotics, bronchodilators.  Respiratory status significantly improving.  Currently down to 4 L, trying to taper the oxygen even more with plan for discharge home tomorrow.  Assessment & Plan:  Principal Problem:   Acute respiratory failure with hypoxia (HCC) Active Problems:   Diabetes mellitus without complication (HCC)   Essential hypertension   HLD (hyperlipidemia)   Chronic diastolic CHF (congestive heart failure) (HCC)   Malignant neoplasm of left kidney (HCC)   GERD (gastroesophageal reflux disease)   Acute hypoxic respiratory failure: Presented with shortness of breath, cough.  Not on oxygen at home.  Required 2 to 5 L of oxygen on presentation.  Patient was started on short course of steroids from outpatient pulmonology without improvement. Chest x-ray did not show any acute findings.  CT PE study was negative for PE but showed bilateral bronchial wall thickening, scattered bibasilar consolidation, mucous plugging.  Imaging suspicious for postobstructive pneumonia.  Patient was started on broad spectrum antibiotics, bronchodilators.  COVID, respiratory viral panel negative.  Patient is bieng  treated for renal cell carcinoma so there is possibility of pneumonitis as well.  Being work-up for diagnosis of COPD as an outpatient.  Imaging showed bronchial wall thickening.  Continue steroids, bronchodilators, antibiotics.  Procalcitonin non-reassuring. He feels much better .  We requested for chest physiotherapy. Continue incentive spirometer, flutter valve.  Current down to 4 L.  He qualified for home oxygen for 4 L on 9/24 but will check again tomorrow anticipating further improvement  Renal cell carcinoma: Currently on Keytruda, follows with Dr. Alen Blew.  Status post left nephrectomy.  Pathology showed clear-cell renal cell carcinoma.  Continue home tamsulosin  History of thoracic aortic aneurysm: Stable.  Imaging showed 4.6 cm ascending thoracic aortic aneurysm.  Semiannual follow-up with CTA/MRI recommended.  He needs to follow-up with vascular surgery as an outpatient.  He is scheduled to follow-up with Dr. Roxan Hockey in 6 months  History of diabetes type 2: Monitor blood sugars.  Continue sliding scale  insulin  Hypertension: Continue losartan, amlodipine.  Monitor blood pressure  Hyperlipidemia: Continue Lipitor, Zetia  History of diastolic CHF: Last echo showed EF of 55 to 20%, grade 1 diastolic dysfunction.  Currently not on diuretics.  GERD: Continue PPI  Obesity: BMI of 33.8       DVT prophylaxis:enoxaparin (LOVENOX) injection 40 mg Start: 06/14/22 2200     Code Status: Full Code  Family Communication: None at bedside  Patient status:Inpatient  Patient is from :Home  Anticipated discharge UR:[facility]  Estimated DC date:tomorrow   Consultants: None  Procedures:None  Antimicrobials:  Anti-infectives (From admission, onward)    Start     Dose/Rate Route Frequency Ordered Stop   06/15/22 1800  azithromycin (ZITHROMAX) 500 mg in sodium chloride 0.9 % 250 mL IVPB  500 mg 250 mL/hr over 60 Minutes Intravenous Every 24 hours 06/15/22 0826     06/15/22  1700  cefTRIAXone (ROCEPHIN) 2 g in sodium chloride 0.9 % 100 mL IVPB        2 g 200 mL/hr over 30 Minutes Intravenous Every 24 hours 06/15/22 0826     06/14/22 1815  cefTRIAXone (ROCEPHIN) 2 g in sodium chloride 0.9 % 100 mL IVPB        2 g 200 mL/hr over 30 Minutes Intravenous  Once 06/14/22 1812 06/14/22 1900   06/14/22 1815  azithromycin (ZITHROMAX) 500 mg in sodium chloride 0.9 % 250 mL IVPB        500 mg 250 mL/hr over 60 Minutes Intravenous  Once 06/14/22 1812 06/14/22 2100       Subjective:  Patient seen and examined at the bedside today.  Hemodynamically stable.  Energetic, feels better.  Getting chest physiotherapy.  Anticipating discharge home tomorrow  Objective: Vitals:   06/17/22 2055 06/18/22 0553 06/18/22 0900 06/18/22 0904  BP: 125/86 (!) 139/97    Pulse: 86 86    Resp: 20 20    Temp: 97.7 F (36.5 C) 98.3 F (36.8 C)    TempSrc: Oral Oral    SpO2: 94% 93% 92% 92%  Weight:      Height:        Intake/Output Summary (Last 24 hours) at 06/18/2022 1152 Last data filed at 06/18/2022 0900 Gross per 24 hour  Intake 1440 ml  Output --  Net 1440 ml   Filed Weights   06/14/22 1402 06/14/22 2015  Weight: 106.6 kg 106.9 kg    Examination:  General exam: Overall comfortable, not in distress HEENT: PERRL Respiratory system:  no wheezes or crackles , scattered coarse breath sounds bilaterally Cardiovascular system: S1 & S2 heard, RRR.  Gastrointestinal system: Abdomen is nondistended, soft and nontender. Central nervous system: Alert and oriented Extremities: No edema, no clubbing ,no cyanosis Skin: No rashes, no ulcers,no icterus     Data Reviewed: I have personally reviewed following labs and imaging studies  CBC: Recent Labs  Lab 06/11/22 1600 06/14/22 1440 06/15/22 0511 06/16/22 0512 06/17/22 0852  WBC 8.0 10.1 7.8 8.6 10.5  NEUTROABS 3.7 6.4  --   --   --   HGB 16.6 17.3* 16.8 16.1 17.1*  HCT 49.6 51.4 51.3 49.3 50.7  MCV 94.5 93.1 95.2 96.3  95.3  PLT 255.0 327 324 302 427   Basic Metabolic Panel: Recent Labs  Lab 06/11/22 1600 06/14/22 1440 06/15/22 0511 06/16/22 0512 06/17/22 0852  NA 136 136 137 139 139  K 4.4 3.6 4.9 4.4 4.8  CL 101 101 100 104 101  CO2 '26 25 26 25 30  '$ GLUCOSE 111* 141* 191* 142* 204*  BUN 16 21* 25* 28* 25*  CREATININE 1.26 1.21 1.30* 1.44* 1.21  CALCIUM 9.7 9.5 9.4 9.2 9.4     Recent Results (from the past 240 hour(s))  SARS Coronavirus 2 by RT PCR (hospital order, performed in [facility] hospital lab) *cepheid single result test* Anterior Nasal Swab     Status: None   Collection Time: 06/14/22  6:44 PM   Specimen: Anterior Nasal Swab  Result Value Ref Range Status   SARS Coronavirus 2 by RT PCR NEGATIVE NEGATIVE Final    Comment: (NOTE) SARS-CoV-2 target nucleic acids are NOT DETECTED.  The SARS-CoV-2 RNA is generally detectable in upper and lower respiratory specimens during the acute phase of infection. The lowest concentration  of SARS-CoV-2 viral copies this assay can detect is 250 copies / mL. A negative result does not preclude SARS-CoV-2 infection and should not be used as the sole basis for treatment or other patient management decisions.  A negative result may occur with improper specimen collection / handling, submission of specimen other than nasopharyngeal swab, presence of viral mutation(s) within the areas targeted by this assay, and inadequate number of viral copies (<250 copies / mL). A negative result must be combined with clinical observations, patient history, and epidemiological information.  Fact Sheet for Patients:   https://www.patel.info/  Fact Sheet for Healthcare Providers: https://hall.com/  This test is not yet approved or  cleared by the [place] FDA and has been authorized for detection and/or diagnosis of SARS-CoV-2 by FDA under an Emergency Use Authorization (EUA).  This EUA will remain in effect  (meaning this test can be used) for the duration of the COVID-19 declaration under Section 564(b)(1) of the Act, 21 U.S.C. section 360bbb-3(b)(1), unless the authorization is terminated or revoked sooner.  Performed at [facility], [place] [address] [place], [place] [address]   Respiratory (~20 pathogens) panel by PCR     Status: None   Collection Time: 06/14/22  6:44 PM   Specimen: Nasopharyngeal Swab; Respiratory  Result Value Ref Range Status   Adenovirus NOT DETECTED NOT DETECTED Final   Coronavirus 229E NOT DETECTED NOT DETECTED Final    Comment: (NOTE) The Coronavirus on the Respiratory Panel, DOES NOT test for the novel  Coronavirus (2019 nCoV)    Coronavirus HKU1 NOT DETECTED NOT DETECTED Final   Coronavirus NL63 NOT DETECTED NOT DETECTED Final   Coronavirus OC43 NOT DETECTED NOT DETECTED Final   Metapneumovirus NOT DETECTED NOT DETECTED Final   Rhinovirus / Enterovirus NOT DETECTED NOT DETECTED Final   Influenza A NOT DETECTED NOT DETECTED Final   Influenza B NOT DETECTED NOT DETECTED Final   Parainfluenza Virus 1 NOT DETECTED NOT DETECTED Final   Parainfluenza Virus 2 NOT DETECTED NOT DETECTED Final   Parainfluenza Virus 3 NOT DETECTED NOT DETECTED Final   Parainfluenza Virus 4 NOT DETECTED NOT DETECTED Final   Respiratory Syncytial Virus NOT DETECTED NOT DETECTED Final   Bordetella pertussis NOT DETECTED NOT DETECTED Final   Bordetella Parapertussis NOT DETECTED NOT DETECTED Final   Chlamydophila pneumoniae NOT DETECTED NOT DETECTED Final   Mycoplasma pneumoniae NOT DETECTED NOT DETECTED Final    Comment: Performed at [facility], [place]. [address] [place], [place] [address]  MRSA Next Gen by PCR, Nasal     Status: None   Collection Time: 06/15/22 10:30 AM   Specimen: Nasal Mucosa; Nasal Swab  Result Value Ref Range Status   MRSA by PCR Next Gen NOT DETECTED NOT DETECTED Final    Comment: (NOTE) The GeneXpert MRSA Assay (FDA approved for  NASAL specimens only), is one component of a comprehensive MRSA colonization surveillance program. It is not intended to diagnose MRSA infection nor to guide or monitor treatment for MRSA infections. Test performance is not FDA approved in patients less than 76 years old. Performed at [facility], [place] [address] [place], [place] [address]      Radiology Studies: No results found.  Scheduled Meds:  amLODipine  10 mg Oral Daily   atorvastatin  80 mg Oral Daily   budesonide (PULMICORT) nebulizer solution  0.5 mg Nebulization BID   enoxaparin (LOVENOX) injection  40 mg Subcutaneous Q24H   ezetimibe  10 mg  Oral Daily   guaiFENesin  1,200 mg Oral BID   insulin aspart  0-15 Units Subcutaneous TID WC   insulin glargine-yfgn  15 Units Subcutaneous Daily   ipratropium-albuterol  3 mL Nebulization TID   losartan  100 mg Oral Daily   pantoprazole  40 mg Oral Daily   predniSONE  50 mg Oral Q breakfast   sodium chloride flush  3 mL Intravenous Q12H   tamsulosin  0.4 mg Oral Daily   Continuous Infusions:  azithromycin (ZITHROMAX) 500 mg in sodium chloride 0.9 % 250 mL IVPB 500 mg (06/17/22 1702)   cefTRIAXone (ROCEPHIN)  IV 2 g (06/17/22 1604)     LOS: 3 days   Shelly Coss, MD [facility] P9/25/2023, 11:52 AM

## 2022-06-18 NOTE — Telephone Encounter (Signed)
Aware, discussed directly with pt am 06/18/2022

## 2022-06-18 NOTE — Progress Notes (Signed)
SATURATION QUALIFICATIONS: (This note is used to comply with regulatory documentation for home oxygen)  Patient Saturations on Room Air at Rest = 86%  Patient Saturations on Room Air while Ambulating = 83%  Patient Saturations on 3 Liters of oxygen while Ambulating = 92%  Please briefly explain why patient needs home oxygen:

## 2022-06-18 NOTE — Telephone Encounter (Signed)
Will forward to Dr. Melvyn Novas as Juluis Rainier.

## 2022-06-19 ENCOUNTER — Encounter: Payer: Self-pay | Admitting: Internal Medicine

## 2022-06-19 DIAGNOSIS — J9601 Acute respiratory failure with hypoxia: Secondary | ICD-10-CM | POA: Diagnosis not present

## 2022-06-19 LAB — BASIC METABOLIC PANEL
Anion gap: 9 (ref 5–15)
BUN: 26 mg/dL — ABNORMAL HIGH (ref 6–20)
CO2: 25 mmol/L (ref 22–32)
Calcium: 9.6 mg/dL (ref 8.9–10.3)
Chloride: 107 mmol/L (ref 98–111)
Creatinine, Ser: 1.11 mg/dL (ref 0.61–1.24)
GFR, Estimated: >60 mL/min (ref >60)
Glucose, Bld: 106 mg/dL — ABNORMAL HIGH (ref 70–99)
Potassium: 4 mmol/L (ref 3.5–5.1)
Sodium: 141 mmol/L (ref 135–145)

## 2022-06-19 LAB — CBC
HCT: 49.5 % (ref 39.0–52.0)
Hemoglobin: 16.4 g/dL (ref 13.0–17.0)
MCH: 31.1 pg (ref 26.0–34.0)
MCHC: 33.1 g/dL (ref 30.0–36.0)
MCV: 93.8 fL (ref 80.0–100.0)
Platelets: 308 10*3/uL (ref 150–400)
RBC: 5.28 MIL/uL (ref 4.22–5.81)
RDW: 12.3 % (ref 11.5–15.5)
WBC: 10.8 10*3/uL — ABNORMAL HIGH (ref 4.0–10.5)
nRBC: 0 % (ref 0.0–0.2)

## 2022-06-19 LAB — GLUCOSE, CAPILLARY
Glucose-Capillary: 115 mg/dL — ABNORMAL HIGH (ref 70–99)
Glucose-Capillary: 173 mg/dL — ABNORMAL HIGH (ref 70–99)

## 2022-06-19 MED ORDER — PREDNISONE 10 MG PO TABS: 30.0000 mg | ORAL_TABLET | Freq: Every day | ORAL | 0 refills | Status: AC

## 2022-06-19 MED ORDER — GUAIFENESIN ER 600 MG PO TB12: 1200.0000 mg | ORAL_TABLET | Freq: Two times a day (BID) | ORAL | 0 refills | Status: AC

## 2022-06-19 MED ORDER — PREDNISONE 10 MG PO TABS: 10.0000 mg | ORAL_TABLET | Freq: Every day | ORAL | 0 refills | Status: DC

## 2022-06-19 MED ORDER — PREDNISONE 20 MG PO TABS: 20.0000 mg | ORAL_TABLET | Freq: Every day | ORAL | Status: DC

## 2022-06-19 MED ORDER — PREDNISONE 20 MG PO TABS: 20.0000 mg | ORAL_TABLET | Freq: Every day | ORAL | 0 refills | Status: DC

## 2022-06-19 MED ORDER — PREDNISONE 5 MG PO TABS: 10.0000 mg | ORAL_TABLET | Freq: Every day | ORAL | Status: DC

## 2022-06-19 MED ORDER — PREDNISONE 5 MG PO TABS: 30.0000 mg | ORAL_TABLET | Freq: Every day | ORAL | Status: DC

## 2022-06-19 NOTE — Discharge Summary (Signed)
Physician Discharge Summary  Russell Burns OZH:[id number] DOB: 05-09-1966 DOA: 06/14/2022  PCP: [name], MD  Admit date: 06/14/2022 Discharge date: 06/19/2022  Admitted From: Home Disposition:  Home  Discharge Condition:Stable CODE STATUS:FULL Diet recommendation: Carb consistent   Brief/Interim Summary:  Patient is a 56 year old male with history of renal cell carcinoma on Keytruda, thoracic aortic aneurysm, hyperlipidemia, hypertension, diabetes type 2 presented with 1 week history of shortness of breath, cough.  Patient was also recently prescribed steroids by his pulmonologist for suspicion of COPD.  On presentation ,he was hemodynamically stable.  Required 2 to 5 L of oxygen for maintenance of saturation.  Chest x-ray did not show any acute findings.  CT PE study was negative for PE but showed bilateral bronchial wall thickening, scattered bibasilar consolidation, mucous plugging.  Imaging suspicious for postobstructive pneumonia.  Patient was started on broad spectrum antibiotics, bronchodilators.  Respiratory status significantly improving.  Currently down to 3 L.  He qualified for home oxygen.  He is medically stable for discharge home today.  He will follow-up with Dr. Melvyn Novas as an outpatient.   Following problems were addressed during his hospitalization:  Acute hypoxic respiratory failure: Presented with shortness of breath, cough.  Not on oxygen at home.  Required 2 to 5 L of oxygen on presentation.  Patient was started on short course of steroids from outpatient pulmonology without improvement. Chest x-ray did not show any acute findings.  CT PE study was negative for PE but showed bilateral bronchial wall thickening, scattered bibasilar consolidation, mucous plugging.  Imaging suspicious for postobstructive pneumonia.  Patient was started on broad spectrum antibiotics, bronchodilators.  COVID, respiratory viral panel negative.  Patient is bieng treated for renal cell carcinoma  so there is possibility of pneumonitis as well.  Being work-up for diagnosis of COPD as an outpatient.  Imaging showed bronchial wall thickening.  Treated with steroids, bronchodilator.  Procalcitonin non-reassuring. He feels much better .  We requested for chest physiotherapy. Continue incentive spirometer, flutter valve.  Current down to 2 L.  He qualified for home oxygen for 3 L .  Continue prednisone on tapering dose.    Renal cell carcinoma: Currently on Keytruda, follows with Dr. Alen Blew.  Status post left nephrectomy.  Pathology showed clear-cell renal cell carcinoma.  Continue home tamsulosin   History of thoracic aortic aneurysm: Stable.  Imaging showed 4.6 cm ascending thoracic aortic aneurysm.  Semiannual follow-up with CTA/MRI recommended.  He needs to follow-up with vascular surgery as an outpatient.  He is scheduled to follow-up with Dr. Roxan Hockey in 6 months   History of diabetes type 2: Monitor blood sugars. Continue home regimen   Hypertension: Continue losartan, amlodipine.    Hyperlipidemia: Continue Lipitor, Zetia   History of diastolic CHF: Last echo showed EF of 55 to 62%, grade 1 diastolic dysfunction.  Currently not on diuretics.   GERD: Continue PPI   Obesity: BMI of 33.8     Discharge Diagnoses:  Principal Problem:   Acute respiratory failure with hypoxia (HCC) Active Problems:   Diabetes mellitus without complication (HCC)   Essential hypertension   HLD (hyperlipidemia)   Chronic diastolic CHF (congestive heart failure) (HCC)   Malignant neoplasm of left kidney (HCC)   GERD (gastroesophageal reflux disease)    Discharge Instructions  Discharge Instructions     Diet Carb Modified   Complete by: As directed    Discharge instructions   Complete by: As directed    1)Please take prescribed medications as instructed 2)Follow  up with Dr. Melvyn Novas as soon as possible 3)Follow up with your PCP   Increase activity slowly   Complete by: As directed        Allergies as of 06/19/2022       Reactions   Bee Venom Anaphylaxis   Other Itching, Swelling, Other (See Comments)   Feline dander- Runny nose, itchy/puffy eyes        Medication List     TAKE these medications    acetaminophen 500 MG tablet Commonly known as: TYLENOL Take 1,000 mg by mouth in the morning.   albuterol 108 (90 Base) MCG/ACT inhaler Commonly known as: VENTOLIN HFA Inhale 2 puffs into the lungs every 4 (four) hours.   amLODipine 10 MG tablet Commonly known as: NORVASC Take 10 mg by mouth daily.   atorvastatin 80 MG tablet Commonly known as: LIPITOR Take 80 mg by mouth daily.   cetirizine 10 MG tablet Commonly known as: ZYRTEC Take 10 mg by mouth in the morning.   docusate sodium 100 MG capsule Commonly known as: COLACE Take 1 capsule (100 mg total) by mouth 2 (two) times daily.   EPINEPHrine 0.3 mg/0.3 mL Soaj injection Commonly known as: EPI-PEN Inject 0.3 mg into the muscle as needed for anaphylaxis.   ezetimibe 10 MG tablet Commonly known as: ZETIA Take 10 mg by mouth daily.   famotidine 20 MG tablet Commonly known as: Pepcid One after supper What changed:  how much to take how to take this when to take this additional instructions   fluticasone 50 MCG/ACT nasal spray Commonly known as: FLONASE Place 2 sprays into both nostrils in the morning and at bedtime.   guaiFENesin 600 MG 12 hr tablet Commonly known as: MUCINEX Take 2 tablets (1,200 mg total) by mouth 2 (two) times daily for 7 days.   Jardiance 25 MG Tabs tablet Generic drug: empagliflozin Take 25 mg by mouth daily.   losartan 100 MG tablet Commonly known as: COZAAR Take 100 mg by mouth daily.   metFORMIN 1000 MG tablet Commonly known as: GLUCOPHAGE Take 1,000 mg by mouth 2 (two) times daily with a meal.   Mounjaro 10 MG/0.5ML Pen Generic drug: tirzepatide Inject 10 mg into the skin once a week. What changed: when to take this   oxyCODONE 5 MG immediate  release tablet Commonly known as: Oxy IR/ROXICODONE Take 1-2 tablets (5-10 mg total) by mouth every 6 (six) hours as needed for severe pain or moderate pain. What changed:  how much to take when to take this additional instructions   pantoprazole 40 MG tablet Commonly known as: Protonix Take 1 tablet (40 mg total) by mouth daily. Take 30-60 min before first meal of the day What changed: when to take this   predniSONE 10 MG tablet Commonly known as: DELTASONE Take 3 tablets (30 mg total) by mouth daily with breakfast for 3 days. Start taking on: June 20, 2022 What changed:  how much to take how to take this when to take this additional instructions   predniSONE 20 MG tablet Commonly known as: DELTASONE Take 1 tablet (20 mg total) by mouth daily with breakfast for 3 days. Start taking on: June 23, 2022 What changed: You were already taking a medication with the same name, and this prescription was added. Make sure you understand how and when to take each.   predniSONE 10 MG tablet Commonly known as: DELTASONE Take 1 tablet (10 mg total) by mouth daily with breakfast for 3 days. Start  taking on: June 26, 2022 What changed: You were already taking a medication with the same name, and this prescription was added. Make sure you understand how and when to take each.   prochlorperazine 10 MG tablet Commonly known as: COMPAZINE Take 1 tablet (10 mg total) by mouth every 6 (six) hours as needed for nausea or vomiting.   promethazine-dextromethorphan 6.25-15 MG/5ML syrup Commonly known as: PROMETHAZINE-DM Take 5-10 mLs by mouth 4 (four) times daily as needed for cough.   Repatha 140 MG/ML Sosy Generic drug: Evolocumab Inject 140 mg into the skin every 14 (fourteen) days.   tamsulosin 0.4 MG Caps capsule Commonly known as: FLOMAX Take 1 capsule (0.4 mg total) by mouth daily.               Durable Medical Equipment  (From admission, onward)            Start     Ordered   06/19/22 1047  For home use only DME oxygen  Once       Question Answer Comment  Length of Need Lifetime   Mode or (Route) Nasal cannula   Liters per Minute 3   Frequency Continuous (stationary and portable oxygen unit needed)   Oxygen delivery system Gas      06/19/22 1046            Follow-up Information     [name], MD. Schedule an appointment as soon as possible for a visit in 1 week(s).   Specialty: Internal Medicine Contact information: [place] [address] [phone]                Allergies  Allergen Reactions   Bee Venom Anaphylaxis   Other Itching, Swelling and Other (See Comments)    Feline dander- Runny nose, itchy/puffy eyes    Consultations: None   Procedures/Studies: CT Angio Chest PE W/Cm &/Or Wo Cm  Result Date: 06/14/2022 CLINICAL DATA:  Short of breath for 1 week, history of left renal cell carcinoma, recently began immunotherapy, positive D-dimer EXAM: CT ANGIOGRAPHY CHEST WITH CONTRAST TECHNIQUE: Multidetector CT imaging of the chest was performed using the standard protocol during bolus administration of intravenous contrast. Multiplanar CT image reconstructions and MIPs were obtained to evaluate the vascular anatomy. RADIATION DOSE REDUCTION: This exam was performed according to the departmental dose-optimization program which includes automated exposure control, adjustment of the mA and/or kV according to patient size and/or use of iterative reconstruction technique. CONTRAST:  36m OMNIPAQUE IOHEXOL 350 MG/ML SOLN COMPARISON:  06/14/2022, 01/01/2022 FINDINGS: Cardiovascular: This is a technically adequate evaluation of the pulmonary vasculature. No filling defects or pulmonary emboli. The heart is unremarkable without pericardial effusion. Stable calcifications of the aortic valve. Stable 4.6 cm ascending thoracic aortic aneurysm. No evidence of dissection. Mediastinum/Nodes: No enlarged mediastinal,  hilar, or axillary lymph nodes. Thyroid gland, trachea, and esophagus demonstrate no significant findings. Lungs/Pleura: There is bilateral bronchial wall thickening with scattered areas of mucous plugging in the lower lobes. Patchy consolidation within the lung bases may reflect a combination of airspace disease and atelectasis. No effusion or pneumothorax. Upper Abdomen: No acute abnormality. Musculoskeletal: No acute or destructive bony lesions. Reconstructed images demonstrate no additional findings. Review of the MIP images confirms the above findings. IMPRESSION: 1. No evidence of pulmonary embolus. 2. Bilateral bronchial wall thickening, with scattered areas of bibasilar consolidation and mucous plugging. Findings could reflect postobstructive changes or pneumonia, in a more pronounced than on preceding exam. 3. Stable 4.6 cm  ascending thoracic aortic aneurysm. Ascending thoracic aortic aneurysm. Recommend semi-annual imaging followup by CTA or MRA and referral to cardiothoracic surgery if not already obtained. This recommendation follows 2010 ACCF/AHA/AATS/ACR/ASA/SCA/SCAI/SIR/STS/SVM Guidelines for the Diagnosis and Management of Patients With Thoracic Aortic Disease. Circulation. 2010; 121: W237-S283. Aortic aneurysm NOS (ICD10-I71.9) 4.  Aortic Atherosclerosis (ICD10-I70.0). Electronically Signed   By: Randa Ngo M.D.   On: 06/14/2022 17:37   DG Chest 1 View  Result Date: 06/14/2022 CLINICAL DATA:  Shortness of breath. EXAM: CHEST  1 VIEW COMPARISON:  Radiographs 06/11/2022 and 01/01/2022.  CT 01/01/2022. FINDINGS: 1457 hours. Lordotic positioning. The heart size and mediastinal contours are normal. The lungs are clear. There is no pleural effusion or pneumothorax. No acute osseous findings are identified. Telemetry leads overlie the chest. IMPRESSION: No evidence of active cardiopulmonary process. Electronically Signed   By: Richardean Sale M.D.   On: 06/14/2022 15:48   DG Chest 2  View  Result Date: 06/13/2022 CLINICAL DATA:  56 year old male with history of cough and shortness of breath following Keytruda therapy. EXAM: CHEST - 2 VIEW COMPARISON:  Chest x-ray 01/01/2022. FINDINGS: Lung volumes are normal. No consolidative airspace disease. No pleural effusions. No pneumothorax. No pulmonary nodule or mass noted. Pulmonary vasculature and the cardiomediastinal silhouette are within normal limits. IMPRESSION: No radiographic evidence of acute cardiopulmonary disease. Electronically Signed   By: Vinnie Langton M.D.   On: 06/13/2022 07:21      Subjective: Patient seen and examined at the bedside today.  Hemodynamically stable for discharge.  Discharge Exam: Vitals:   06/19/22 0827 06/19/22 0914  BP:  137/87  Pulse:  80  Resp:  18  Temp:  98.3 F (36.8 C)  SpO2: 92% 94%   Vitals:   06/19/22 0544 06/19/22 0824 06/19/22 0827 06/19/22 0914  BP: (!) 136/91   137/87  Pulse: 77   80  Resp: 16   18  Temp: 97.7 F (36.5 C)   98.3 F (36.8 C)  TempSrc: Oral   Oral  SpO2: 93% 92% 92% 94%  Weight:      Height:        General: Pt is alert, awake, not in acute distress Cardiovascular: RRR, S1/S2 +, no rubs, no gallops Respiratory: CTA bilaterally, no wheezing, no rhonchi Abdominal: Soft, NT, ND, bowel sounds + Extremities: no edema, no cyanosis    The results of significant diagnostics from this hospitalization (including imaging, microbiology, ancillary and laboratory) are listed below for reference.     Microbiology: Recent Results (from the past 240 hour(s))  SARS Coronavirus 2 by RT PCR (hospital order, performed in [facility] hospital lab) *cepheid single result test* Anterior Nasal Swab     Status: None   Collection Time: 06/14/22  6:44 PM   Specimen: Anterior Nasal Swab  Result Value Ref Range Status   SARS Coronavirus 2 by RT PCR NEGATIVE NEGATIVE Final    Comment: (NOTE) SARS-CoV-2 target nucleic acids are NOT DETECTED.  The SARS-CoV-2 RNA is  generally detectable in upper and lower respiratory specimens during the acute phase of infection. The lowest concentration of SARS-CoV-2 viral copies this assay can detect is 250 copies / mL. A negative result does not preclude SARS-CoV-2 infection and should not be used as the sole basis for treatment or other patient management decisions.  A negative result may occur with improper specimen collection / handling, submission of specimen other than nasopharyngeal swab, presence of viral mutation(s) within the areas targeted by this assay, and inadequate  number of viral copies (<250 copies / mL). A negative result must be combined with clinical observations, patient history, and epidemiological information.  Fact Sheet for Patients:   https://www.patel.info/  Fact Sheet for Healthcare Providers: https://hall.com/  This test is not yet approved or  cleared by the [place] FDA and has been authorized for detection and/or diagnosis of SARS-CoV-2 by FDA under an Emergency Use Authorization (EUA).  This EUA will remain in effect (meaning this test can be used) for the duration of the COVID-19 declaration under Section 564(b)(1) of the Act, 21 U.S.C. section 360bbb-3(b)(1), unless the authorization is terminated or revoked sooner.  Performed at [facility], [place] [address] [place], [place] [address]   Respiratory (~20 pathogens) panel by PCR     Status: None   Collection Time: 06/14/22  6:44 PM   Specimen: Nasopharyngeal Swab; Respiratory  Result Value Ref Range Status   Adenovirus NOT DETECTED NOT DETECTED Final   Coronavirus 229E NOT DETECTED NOT DETECTED Final    Comment: (NOTE) The Coronavirus on the Respiratory Panel, DOES NOT test for the novel  Coronavirus (2019 nCoV)    Coronavirus HKU1 NOT DETECTED NOT DETECTED Final   Coronavirus NL63 NOT DETECTED NOT DETECTED Final   Coronavirus OC43 NOT DETECTED NOT  DETECTED Final   Metapneumovirus NOT DETECTED NOT DETECTED Final   Rhinovirus / Enterovirus NOT DETECTED NOT DETECTED Final   Influenza A NOT DETECTED NOT DETECTED Final   Influenza B NOT DETECTED NOT DETECTED Final   Parainfluenza Virus 1 NOT DETECTED NOT DETECTED Final   Parainfluenza Virus 2 NOT DETECTED NOT DETECTED Final   Parainfluenza Virus 3 NOT DETECTED NOT DETECTED Final   Parainfluenza Virus 4 NOT DETECTED NOT DETECTED Final   Respiratory Syncytial Virus NOT DETECTED NOT DETECTED Final   Bordetella pertussis NOT DETECTED NOT DETECTED Final   Bordetella Parapertussis NOT DETECTED NOT DETECTED Final   Chlamydophila pneumoniae NOT DETECTED NOT DETECTED Final   Mycoplasma pneumoniae NOT DETECTED NOT DETECTED Final    Comment: Performed at [facility], [place]. [address], [place], [place] [address]  MRSA Next Gen by PCR, Nasal     Status: None   Collection Time: 06/15/22 10:30 AM   Specimen: Nasal Mucosa; Nasal Swab  Result Value Ref Range Status   MRSA by PCR Next Gen NOT DETECTED NOT DETECTED Final    Comment: (NOTE) The GeneXpert MRSA Assay (FDA approved for NASAL specimens only), is one component of a comprehensive MRSA colonization surveillance program. It is not intended to diagnose MRSA infection nor to guide or monitor treatment for MRSA infections. Test performance is not FDA approved in patients less than 18 years old. Performed at [facility], [place] [address], [place], [place] [address]      Labs: BNP (last 3 results) Recent Labs    01/01/22 1447 06/14/22 1440  BNP 19.5 20.9   Basic Metabolic Panel: Recent Labs  Lab 06/14/22 1440 06/15/22 0511 06/16/22 0512 06/17/22 0852 06/19/22 0514  NA 136 137 139 139 141  K 3.6 4.9 4.4 4.8 4.0  CL 101 100 104 101 107  CO2 '25 26 25 30 25  '$ GLUCOSE 141* 191* 142* 204* 106*  BUN 21* 25* 28* 25* 26*  CREATININE 1.21 1.30* 1.44* 1.21 1.11  CALCIUM 9.5 9.4 9.2 9.4 9.6   Liver Function  Tests: Recent Labs  Lab 06/14/22 1440 06/15/22 0511  AST 23 24  ALT 25 25  ALKPHOS 67 65  BILITOT 0.6 0.7  PROT 8.1 7.8  ALBUMIN 4.5 4.0   No results for input(s): "LIPASE", "AMYLASE" in the last 168 hours. No results for input(s): "AMMONIA" in the last 168 hours. CBC: Recent Labs  Lab 06/14/22 1440 06/15/22 0511 06/16/22 0512 06/17/22 0852 06/19/22 0514  WBC 10.1 7.8 8.6 10.5 10.8*  NEUTROABS 6.4  --   --   --   --   HGB 17.3* 16.8 16.1 17.1* 16.4  HCT 51.4 51.3 49.3 50.7 49.5  MCV 93.1 95.2 96.3 95.3 93.8  PLT 327 324 302 326 308   Cardiac Enzymes: No results for input(s): "CKTOTAL", "CKMB", "CKMBINDEX", "TROPONINI" in the last 168 hours. BNP: Invalid input(s): "POCBNP" CBG: Recent Labs  Lab 06/18/22 0754 06/18/22 1129 06/18/22 1628 06/18/22 2029 06/19/22 0726  GLUCAP 136* 117* 295* 411* 115*   D-Dimer No results for input(s): "DDIMER" in the last 72 hours. Hgb A1c No results for input(s): "HGBA1C" in the last 72 hours. Lipid Profile No results for input(s): "CHOL", "HDL", "LDLCALC", "TRIG", "CHOLHDL", "LDLDIRECT" in the last 72 hours. Thyroid function studies No results for input(s): "TSH", "T4TOTAL", "T3FREE", "THYROIDAB" in the last 72 hours.  Invalid input(s): "FREET3" Anemia work up No results for input(s): "VITAMINB12", "FOLATE", "FERRITIN", "TIBC", "IRON", "RETICCTPCT" in the last 72 hours. Urinalysis    Component Value Date/Time   COLORURINE STRAW (A) 06/15/2022 0456   APPEARANCEUR CLEAR 06/15/2022 0456   LABSPEC 1.030 06/15/2022 0456   PHURINE 5.0 06/15/2022 0456   GLUCOSEU >=500 (A) 06/15/2022 0456   HGBUR NEGATIVE 06/15/2022 0456   BILIRUBINUR NEGATIVE 06/15/2022 0456   KETONESUR NEGATIVE 06/15/2022 0456   PROTEINUR NEGATIVE 06/15/2022 0456   NITRITE NEGATIVE 06/15/2022 0456   LEUKOCYTESUR NEGATIVE 06/15/2022 0456   Sepsis Labs Recent Labs  Lab 06/15/22 0511 06/16/22 0512 06/17/22 0852 06/19/22 0514  WBC 7.8 8.6 10.5 10.8*    Microbiology Recent Results (from the past 240 hour(s))  SARS Coronavirus 2 by RT PCR (hospital order, performed in [facility] hospital lab) *cepheid single result test* Anterior Nasal Swab     Status: None   Collection Time: 06/14/22  6:44 PM   Specimen: Anterior Nasal Swab  Result Value Ref Range Status   SARS Coronavirus 2 by RT PCR NEGATIVE NEGATIVE Final    Comment: (NOTE) SARS-CoV-2 target nucleic acids are NOT DETECTED.  The SARS-CoV-2 RNA is generally detectable in upper and lower respiratory specimens during the acute phase of infection. The lowest concentration of SARS-CoV-2 viral copies this assay can detect is 250 copies / mL. A negative result does not preclude SARS-CoV-2 infection and should not be used as the sole basis for treatment or other patient management decisions.  A negative result may occur with improper specimen collection / handling, submission of specimen other than nasopharyngeal swab, presence of viral mutation(s) within the areas targeted by this assay, and inadequate number of viral copies (<250 copies / mL). A negative result must be combined with clinical observations, patient history, and epidemiological information.  Fact Sheet for Patients:   https://www.patel.info/  Fact Sheet for Healthcare Providers: https://hall.com/  This test is not yet approved or  cleared by the [place] FDA and has been authorized for detection and/or diagnosis of SARS-CoV-2 by FDA under an Emergency Use Authorization (EUA).  This EUA will remain in effect (meaning this test can be used) for the duration of the COVID-19 declaration under Section 564(b)(1) of the Act, 21 U.S.C. section 360bbb-3(b)(1), unless the authorization is terminated or revoked sooner.  Performed at [facility], [place] [address], [place], [place] [address]   Respiratory (~20 pathogens) panel by PCR     Status: None    Collection Time: 06/14/22  6:44 PM   Specimen: Nasopharyngeal Swab; Respiratory  Result Value Ref Range Status   Adenovirus NOT DETECTED NOT DETECTED Final   Coronavirus 229E NOT DETECTED NOT DETECTED Final    Comment: (NOTE) The Coronavirus on the Respiratory Panel, DOES NOT test for the novel  Coronavirus (2019 nCoV)    Coronavirus HKU1 NOT DETECTED NOT DETECTED Final   Coronavirus NL63 NOT DETECTED NOT DETECTED Final   Coronavirus OC43 NOT DETECTED NOT DETECTED Final   Metapneumovirus NOT DETECTED NOT DETECTED Final   Rhinovirus / Enterovirus NOT DETECTED NOT DETECTED Final   Influenza A NOT DETECTED NOT DETECTED Final   Influenza B NOT DETECTED NOT DETECTED Final   Parainfluenza Virus 1 NOT DETECTED NOT DETECTED Final   Parainfluenza Virus 2 NOT DETECTED NOT DETECTED Final   Parainfluenza Virus 3 NOT DETECTED NOT DETECTED Final   Parainfluenza Virus 4 NOT DETECTED NOT DETECTED Final   Respiratory Syncytial Virus NOT DETECTED NOT DETECTED Final   Bordetella pertussis NOT DETECTED NOT DETECTED Final   Bordetella Parapertussis NOT DETECTED NOT DETECTED Final   Chlamydophila pneumoniae NOT DETECTED NOT DETECTED Final   Mycoplasma pneumoniae NOT DETECTED NOT DETECTED Final    Comment: Performed at [facility], [place]. [address], [place], [place] [address]  MRSA Next Gen by PCR, Nasal     Status: None   Collection Time: 06/15/22 10:30 AM   Specimen: Nasal Mucosa; Nasal Swab  Result Value Ref Range Status   MRSA by PCR Next Gen NOT DETECTED NOT DETECTED Final    Comment: (NOTE) The GeneXpert MRSA Assay (FDA approved for NASAL specimens only), is one component of a comprehensive MRSA colonization surveillance program. It is not intended to diagnose MRSA infection nor to guide or monitor treatment for MRSA infections. Test performance is not FDA approved in patients less than 9 years old. Performed at [facility], [place] [address] [place], [place] [address]      Please note: You were cared for by a hospitalist during your hospital stay. Once you are discharged, your primary care physician will handle any further medical issues. Please note that NO REFILLS for any discharge medications will be authorized once you are discharged, as it is imperative that you return to your primary care physician (or establish a relationship with a primary care physician if you do not have one) for your post hospital discharge needs so that they can reassess your need for medications and monitor your lab values.    Time coordinating discharge: 40 minutes  SIGNED:   Shelly Coss, MD  [facility] 06/19/2022, 10:47 AM Pager [phone]  If 7PM-7AM, please contact night-coverage [email or web address] Password [email or web address]

## 2022-06-19 NOTE — Progress Notes (Signed)
SATURATION QUALIFICATIONS: (This note is used to comply with regulatory documentation for home oxygen)   Patient Saturations on Room Air at Rest = 88%   Patient Saturations on Room Air while Ambulating = 86%   Patient Saturations on 3 Liters of oxygen while Ambulating = 94%

## 2022-06-19 NOTE — TOC Transition Note (Signed)
Transition of Care [facility]) - CM/SW Discharge Note   Patient Details  Name: Russell Burns MRN: [id number] Date of Birth: 14-Aug-1966  Transition of Care [facility]) CM/SW Contact:  Dessa Phi, RN Phone Number: 06/19/2022, 11:18 AM   Clinical Narrative:   Home 02 ordered-patient agree to home 02-[employer] rep Andee Poles to deliver travel tank to rm prior d/c. No further CM needs.    Final next level of care: Home/Self Care Barriers to Discharge: No Barriers Identified   Patient Goals and CMS Choice Patient states their goals for this hospitalization and ongoing recovery are::  (Home) CMS Medicare.gov Compare Post Acute Care list provided to:: Patient Choice offered to / list presented to : NA  Discharge Placement                       Discharge Plan and Services   Discharge Planning Services: CM Consult Post Acute Care Choice:  (Home)          DME Arranged: Oxygen DME Agency: [employer] Date DME Agency Contacted: 06/19/22 Time DME Agency Contacted: [id number] Representative spoke with at DME Agency: Andee Poles            Social Determinants of Health ([place]) Interventions     Readmission Risk Interventions     No data to display

## 2022-06-19 NOTE — TOC Transition Note (Addendum)
Transition of Care [facility]) - CM/SW Discharge Note   Patient Details  Name: Vander Kueker MRN: [id number] Date of Birth: [birth year]  Transition of Care [facility]) CM/SW Contact:  Dessa Phi, RN Phone Number: 06/19/2022, 11:12 AM   Clinical Narrative: d/c home no needs or orders.   -11a noted home 02 ordered. [employer] rep Andee Poles to deliver travel tank to rm proir d/c.    Final next level of care: Home/Self Care Barriers to Discharge: No Barriers Identified   Patient Goals and CMS Choice Patient states their goals for this hospitalization and ongoing recovery are::  (Home) CMS Medicare.gov Compare Post Acute Care list provided to:: Patient Choice offered to / list presented to : NA  Discharge Placement                       Discharge Plan and Services   Discharge Planning Services: CM Consult Post Acute Care Choice:  (Home)                               Social Determinants of Health (SDOH) Interventions     Readmission Risk Interventions     No data to display

## 2022-06-21 ENCOUNTER — Ambulatory Visit: Payer: BC Managed Care – PPO | Admitting: Oncology

## 2022-06-21 ENCOUNTER — Ambulatory Visit: Payer: BC Managed Care – PPO

## 2022-06-21 ENCOUNTER — Other Ambulatory Visit: Payer: BC Managed Care – PPO

## 2022-06-21 NOTE — Telephone Encounter (Signed)
15 min slot fine

## 2022-06-21 NOTE — Telephone Encounter (Signed)
Per d/c instructions pt is to follow up with [facility] as soon as possible. However, the soonest 30 min availability isnt until 10/18. There are sooner appts in [place] but they are 15 min appt slots.    Dr. Melvyn Novas, please advise if you are ok with pt coming in on 10/18 for HFU or would you like him seen sooner by an APP or with you in a 15 min slot? Thanks.

## 2022-06-22 DIAGNOSIS — E119 Type 2 diabetes mellitus without complications: Secondary | ICD-10-CM | POA: Diagnosis not present

## 2022-06-22 NOTE — Telephone Encounter (Signed)
Pt has been scheduled for an appt with MW 10/4. Nothing further needed.

## 2022-06-23 DIAGNOSIS — I5032 Chronic diastolic (congestive) heart failure: Secondary | ICD-10-CM | POA: Diagnosis not present

## 2022-06-23 DIAGNOSIS — J9601 Acute respiratory failure with hypoxia: Secondary | ICD-10-CM | POA: Diagnosis not present

## 2022-06-27 ENCOUNTER — Ambulatory Visit (INDEPENDENT_AMBULATORY_CARE_PROVIDER_SITE_OTHER): Payer: BC Managed Care – PPO | Admitting: Internal Medicine

## 2022-06-27 ENCOUNTER — Encounter: Payer: Self-pay | Admitting: Internal Medicine

## 2022-06-27 DIAGNOSIS — D49512 Neoplasm of unspecified behavior of left kidney: Secondary | ICD-10-CM

## 2022-06-27 DIAGNOSIS — E119 Type 2 diabetes mellitus without complications: Secondary | ICD-10-CM

## 2022-06-27 DIAGNOSIS — J9601 Acute respiratory failure with hypoxia: Secondary | ICD-10-CM

## 2022-06-27 DIAGNOSIS — I712 Thoracic aortic aneurysm, without rupture, unspecified: Secondary | ICD-10-CM

## 2022-06-27 DIAGNOSIS — I110 Hypertensive heart disease with heart failure: Secondary | ICD-10-CM

## 2022-06-27 DIAGNOSIS — J4489 Other specified chronic obstructive pulmonary disease: Secondary | ICD-10-CM | POA: Diagnosis not present

## 2022-06-27 DIAGNOSIS — E669 Obesity, unspecified: Secondary | ICD-10-CM

## 2022-06-27 DIAGNOSIS — E785 Hyperlipidemia, unspecified: Secondary | ICD-10-CM | POA: Diagnosis not present

## 2022-06-27 DIAGNOSIS — J45901 Unspecified asthma with (acute) exacerbation: Secondary | ICD-10-CM | POA: Insufficient documentation

## 2022-06-27 DIAGNOSIS — R0609 Other forms of dyspnea: Secondary | ICD-10-CM | POA: Diagnosis not present

## 2022-06-27 DIAGNOSIS — I5032 Chronic diastolic (congestive) heart failure: Secondary | ICD-10-CM

## 2022-06-27 MED ORDER — BUDESONIDE-FORMOTEROL FUMARATE 80-4.5 MCG/ACT IN AERO: INHALATION_SPRAY | RESPIRATORY_TRACT | 12 refills | Status: DC

## 2022-06-27 NOTE — Patient Instructions (Addendum)
Plan A = Automatic = Always=    Symbicort 80 Take 2 puffs first thing in am and then another 2 puffs about 12 hours later.    Work on inhaler technique:  relax and gently blow all the way out then take a nice smooth full deep breath back in, triggering the inhaler at same time you start breathing in.  Hold breath in for at least  5 seconds if you can. Blow out Symbicort 80  thru nose. Rinse and gargle with water when done.  If mouth or throat bother you at all,  try brushing teeth/gums/tongue with arm and hammer toothpaste/ make a slurry and gargle and spit out.       Plan B = Backup (to supplement plan A, not to replace it) Only use your albuterol inhaler as a rescue medication to be used if you can't catch your breath by resting or doing a relaxed purse lip breathing pattern.  - The less you use it, the better it will work when you need it. - Ok to use the inhaler up to 2 puffs  every 4 hours if you must but call for appointment if use goes up over your usual need - Don't leave home without it !!  (think of it like the spare tire for your car)     Ok to try albuterol 15 min before an activity (on alternating days)  that you know would usually make you short of breath and see if it makes any difference and if makes none then don't take albuterol after activity unless you can't catch your breath as this means it's the resting that helps, not the albuterol.  Make sure you check your oxygen saturation  AT  your highest level of activity (not after you stop)   to be sure it stays over 90% and adjust  02 flow upward to maintain this level if needed but remember to turn it back to previous settings when you stop (to conserve your supply).   Ok to return to work without restrictions Jul 02 2022 but you will access to 02 and your rescue inhaler  Please schedule a follow up office visit in 4 weeks, sooner if needed

## 2022-06-27 NOTE — Assessment & Plan Note (Signed)
Improving p d/c ketruda and rx as AB   Advised Continue 02 3lpm hs and titrate to > 90% with activity  See admit 06/14/22 > d/c on 3lpm NP     Each maintenance medication was reviewed in detail including emphasizing most importantly the difference between maintenance and prns and under what circumstances the prns are to be triggered using an action plan format where appropriate.  Total time for H and P, chart review, counseling, reviewing hfa  device(s) and generating customized AVS unique to this post hosp office visit / same day charting > 30 min

## 2022-06-27 NOTE — Progress Notes (Signed)
Russell Burns, male    DOB: 1965-11-30   MRN: [id number]   Brief patient profile:  30   yowm never smoker seasonal rhinitis spring > fall rx drixoral  referred to pulmonary clinic 06/11/2022 by Russell Burns for sob/ wife Russell Burns  Has had good ex tolerance / very active up and down steps then 1st covid Dec 2020 got infusion sick 3 weeks but back to baseline then 2nd infection just sniffles oct 2022 then Jan 2023 severe coughing fits with sob mostly daytime then April  2023 dx renal cell  cancer cough syncope   > admit     Admit date: 01/01/2022 Discharge date: 01/07/2022   Time spent: 50 minutes   Recommendations for Outpatient Follow-up:      Acute respiratory failure with hypoxia ([place]) -Presented with hypoxia down to 88 to 89% requiring 3 L via nasal cannula.  He reports about 4 months of symptoms of nonproductive cough with exertional dyspnea and orthopnea.  CTA of the chest is negative for pulmonary embolism but showed findings suggestive of small airway disease with mucous plugging of the lower lobes.  CTA chest in January  showed airway debris bilaterally concerning for bronchitis versus aspiration.  He has no formal diagnosis of COPD and only has remote history of tobacco use 20 years ago,but reports -maternal uncle with COPD due to alpha antitrypsin deficiency. -4/11 Alpha-1 antitrypsin pending -Unclear if this is really COPD. he will need outpatient PFT   -DuoNeb QID -Trial of nebulized hypertonic saline and inhaled steroid for mucolytic.  -4/11 chest physiotherapy TID.  -daily IV Solu-Medrol '60mg'$  -4/12 resolved: DC Solu-Medrol - 4/12 Prednisone 50 mg x 5 days    Left Renal mass/suspicious cyst of the upper pole RIGHT kidney -4/11 review of CT scanning of abdomen 4/4 shows mass in LEFT kidney consistent with malignancy, and suspicious mass in right kidney. - 4/12 discussed case with Dr Diona Fanti U[facility] stated equipment required to remove kidney not in hospital.  Patient to  follow-up upon discharge, to discuss  LEFT kidney resection -Follow-up with urologist ASAP upon discharge   HLD (hyperlipidemia) Continue home meds once med rec is complete   Thoracic aortic aneurysm ([place]) -Stable 4.6 cm ascending aortic aneurysm.  See CTA PE protocol below -4/11 patient never seen cardiothoracic surgery, and has SIGNIFICANT family history of ruptured AAA in his family. - 4/12 discussed case with Russell Burns Cardiothoracic Surgery. patient is to follow-up with him in 6 months.     Chronic diastolic CHF/Moderate AS -Echo in 09/2021 with EF of 55 to 60% and grade 1 diastolic dysfunction. - If symptoms do not improve with pulmonary therapy, could consider consult to cardiology to discuss whether symptoms are related to his moderate aortic stenosis.     Essential hypertension -BP goal <120/80 if patient can tolerate, secondary to AAA. - Amlodipine 10 mg daily - Losartan 100 mg daily -Hydralazine PRN SBP> 130 or DBP> 90 -4/12 BP fairly well controlled on current medication   Diabetes mellitus without complication (HCC) - Discharged on home medication   Obese (BMI 34.4 kg/m) -Discuss weight loss strategies with PCP     Discharge Diagnoses:  Principal Problem:   Acute respiratory failure with hypoxia ([place]) Active Problems:   Diabetes mellitus without complication ([place])   Essential hypertension   Thoracic aortic aneurysm (HCC)   HLD (hyperlipidemia)   Left renal mass   Chronic diastolic CHF (congestive heart failure) ([place])      History of Present Illness  06/11/2022  Pulmonary/ 1st office eval/Russell Burns  was 80% better p admit until a week p 1st use of Keytruda which was on 05/10/22  Chief Complaint  Patient presents with   Consult    Referred by PCP for cough and increased SOB. States this has been going on since January 2023. Was in the hospital back in April 2023 for the same concern.   Dyspnea:  MMRC3 = can't walk 100 yards even at a slow pace at a flat  grade s stopping due to sob   Cough: worse at hs dry with subjective wheeze  Sleep: bolt upright x 2 weeks  SABA use: anoro daily/ saba not really sure it's helping  Rec Prednisone 10 mg Take 4 for three days 3 for three days 2 for three days 1 for three days and stop  For cough > Promethazine DM 2 tsp four times daily and supplement with oxycodone 5 mg every 4 hours as needed  For tickle >  For drainage / throat tickle try take CHLORPHENIRAMINE  4 mg Prilosec  (20 x 2) 40 mg  (Pantoprazole 40 mg)   Take  30-60 min before first meal of the day and Pepcid (famotidine)  20 mg after supper until return to office - this is the best way to tell whether stomach acid is contributing to your problem.   GERD diet No more [place] or anoro Ok to use albuterol up to 2 puffs every 4 hours if you feel it helps your breathing or cough  Please schedule a follow up office visit in 2 weeks, sooner if needed- to [facility] ER in meantime if getting worse or 02 sats on RA < 90% at rest   Admit date: 06/14/2022 Discharge date: 06/19/2022   Admitted From: Home Disposition:  Home    Brief/Interim Summary:   Patient is a 56 year old male with history of renal cell carcinoma on Keytruda, thoracic aortic aneurysm, hyperlipidemia, hypertension, diabetes type 2 presented with 1 week history of shortness of breath, cough.  Patient was also recently prescribed steroids by his pulmonologist for suspicion of COPD.  On presentation ,he was hemodynamically stable.  Required 2 to 5 L of oxygen for maintenance of saturation.  Chest x-ray did not show any acute findings.  CT PE study was negative for PE but showed bilateral bronchial wall thickening, scattered bibasilar consolidation, mucous plugging.  Imaging suspicious for postobstructive pneumonia.  Patient was started on broad spectrum antibiotics, bronchodilators.  Respiratory status significantly improving.  Currently down to 3 L.  He qualified for home oxygen.  He is medically  stable for discharge home today.  He will follow-up with Russell Burns as an outpatient.     Following problems were addressed during his hospitalization:   Acute hypoxic respiratory failure: Presented with shortness of breath, cough.  Not on oxygen at home.  Required 2 to 5 L of oxygen on presentation.  Patient was started on short course of steroids from outpatient pulmonology without improvement. Chest x-ray did not show any acute findings.  CT PE study was negative for PE but showed bilateral bronchial wall thickening, scattered bibasilar consolidation, mucous plugging.  Imaging suspicious for postobstructive pneumonia.  Patient was started on broad spectrum antibiotics, bronchodilators.  COVID, respiratory viral panel negative.  Patient is bieng treated for renal cell carcinoma so there is possibility of pneumonitis as well.  Being work-up for diagnosis of COPD as an outpatient.  Imaging showed bronchial wall thickening.  Treated with steroids, bronchodilator.  Procalcitonin non-reassuring. He  feels much better .  We requested for chest physiotherapy. Continue incentive spirometer, flutter valve.  Current down to 2 L.  He qualified for home oxygen for 3 L .  Continue prednisone on tapering dose.     Renal cell carcinoma: Currently on Keytruda, follows with Dr. Alen Burns.  Status post left nephrectomy.  Pathology showed clear-cell renal cell carcinoma.  Continue home tamsulosin   History of thoracic aortic aneurysm: Stable.  Imaging showed 4.6 cm ascending thoracic aortic aneurysm.  Semiannual follow-up with CTA/MRI recommended.  He needs to follow-up with vascular surgery as an outpatient.  He is scheduled to follow-up with Russell Burns in 6 months    History of diastolic CHF: Last echo showed EF of 55 to 16%, grade 1 diastolic dysfunction.  Currently not on diuretics.          Discharge Diagnoses:  Principal Problem:   Acute respiratory failure with hypoxia ([place]) Active Problems:   Diabetes  mellitus without complication (HCC)   Essential hypertension   HLD (hyperlipidemia)   Chronic diastolic CHF (congestive heart failure) ([place])   Malignant neoplasm of left kidney (HCC)   GERD (gastroesophageal reflux disease)       06/27/2022  f/u ov/Russell Burns re: acute resp failure   maint on prn ventolin and last dose of pred 06/27/2022  Chief Complaint  Patient presents with   Follow-up    Better 94%, sob and cough-gray better   Dyspnea:  across parking lots ok with or without 02  Cough: a bit of grey mucus still mostly in am  Sleeping: recliner 20 degrees = baseline  SABA use: twice daily  02: 3lpm hs and prn  Covid status:  vax max    No obvious day to day or daytime variability or assoc  mucus plugs or hemoptysis or cp or chest tightness, subjective wheeze or overt sinus or hb symptoms.   Sleeping  without nocturnal  or early am exacerbation  of respiratory  c/o's or need for noct saba. Also denies any obvious fluctuation of symptoms with weather or environmental changes or other aggravating or alleviating factors except as outlined above   No unusual exposure hx or h/o childhood pna/ asthma or knowledge of premature birth.  Current Allergies, Complete Past Medical History, Past Surgical History, Family History, and Social History were reviewed in [employer] record.  ROS  The following are not active complaints unless bolded Hoarseness, sore throat, dysphagia, dental problems, itching, sneezing,  nasal congestion or discharge of excess mucus or purulent secretions, ear ache,   fever, chills, sweats, unintended wt loss or wt gain, classically pleuritic or exertional cp,  orthopnea pnd or arm/hand swelling  or leg swelling, presyncope, palpitations, abdominal pain, anorexia, nausea, vomiting, diarrhea  or change in bowel habits or change in bladder habits, change in stools or change in urine, dysuria, hematuria,  rash, arthralgias, visual complaints, headache,  numbness, weakness or ataxia or problems with walking or coordination,  change in mood or  memory.        Current Meds  Medication Sig   acetaminophen (TYLENOL) 500 MG tablet Take 1,000 mg by mouth in the morning.   albuterol (VENTOLIN HFA) 108 (90 Base) MCG/ACT inhaler Inhale 2 puffs into the lungs every 4 (four) hours.   amLODipine (NORVASC) 10 MG tablet Take 10 mg by mouth daily.   atorvastatin (LIPITOR) 80 MG tablet Take 80 mg by mouth daily.   cetirizine (ZYRTEC) 10 MG tablet Take 10 mg by mouth  in the morning.   docusate sodium (COLACE) 100 MG capsule Take 1 capsule (100 mg total) by mouth 2 (two) times daily.   EPINEPHrine 0.3 mg/0.3 mL IJ SOAJ injection Inject 0.3 mg into the muscle as needed for anaphylaxis.   ezetimibe (ZETIA) 10 MG tablet Take 10 mg by mouth daily.   famotidine (PEPCID) 20 MG tablet One after supper (Patient taking differently: Take 20 mg by mouth daily after supper.)   fluticasone (FLONASE) 50 MCG/ACT nasal spray Place 2 sprays into both nostrils in the morning and at bedtime.   JARDIANCE 25 MG TABS tablet Take 25 mg by mouth daily.   losartan (COZAAR) 100 MG tablet Take 100 mg by mouth daily.   metFORMIN (GLUCOPHAGE) 1000 MG tablet Take 1,000 mg by mouth 2 (two) times daily with a meal.   oxyCODONE (OXY IR/ROXICODONE) 5 MG immediate release tablet Take 1-2 tablets (5-10 mg total) by mouth every 6 (six) hours as needed for severe pain or moderate pain. (Patient taking differently: Take 5 mg by mouth See admin instructions. Take 5 mg by mouth at bedtime, if taking Promethazine-DM cough syrup)   pantoprazole (PROTONIX) 40 MG tablet Take 1 tablet (40 mg total) by mouth daily. Take 30-60 min before first meal of the day (Patient taking differently: Take 40 mg by mouth daily before breakfast. Take 30-60 min before first meal of the day)   predniSONE (DELTASONE) 10 MG tablet Take 1 tablet (10 mg total) by mouth daily with breakfast for 3 days.   prochlorperazine  (COMPAZINE) 10 MG tablet Take 1 tablet (10 mg total) by mouth every 6 (six) hours as needed for nausea or vomiting.   promethazine-dextromethorphan (PROMETHAZINE-DM) 6.25-15 MG/5ML syrup Take 5-10 mLs by mouth 4 (four) times daily as needed for cough.   REPATHA 140 MG/ML SOSY Inject 140 mg into the skin every 14 (fourteen) days.   tamsulosin (FLOMAX) 0.4 MG CAPS capsule Take 1 capsule (0.4 mg total) by mouth daily.   tirzepatide (MOUNJARO) 10 MG/0.5ML Pen Inject 10 mg into the skin once a week. (Patient taking differently: Inject 10 mg into the skin every Friday.)             Past Medical History:  Diagnosis Date   Allergy    Aortic stenosis    Arthritis    Blood transfusion without reported diagnosis    Cancer ([place])    Chronic kidney disease    Diabetes mellitus without complication ([place])    FH: thoracic aneurysm    Hyperlipidemia    Hypertension    Obesity        Objective:        06/27/2022       247   06/11/22 235 lb 6.4 oz (106.8 kg)  05/31/22 239 lb 6.4 oz (108.6 kg)  05/10/22 239 lb (108.4 kg)    Vital signs reviewed  06/27/2022  - Note at rest 02 sats  98% on 3lpm    General appearance:    amb mod obese wm nad       HEENT : Oropharynx  clear       NECK :  without  apparent JVD/ palpable Nodes/TM    LUNGS: no acc muscle use,  Nl contour chest which is clear to A and P bilaterally without cough on insp or exp maneuvers   CV:  RRR  no s3 or murmur or increase in P2, and no edema   ABD:  soft and nontender with nl inspiratory excursion  in the supine position. No bruits or organomegaly appreciated   MS:  Nl gait/ ext warm without deformities Or obvious joint restrictions  calf tenderness, cyanosis or clubbing    SKIN: warm and dry without lesions    NEURO:  alert, approp, nl sensorium with  no motor or cerebellar deficits apparent.         I personally reviewed images and agree with radiology impression as follows:   Chest CTa 06/14/22 1. No evidence  of pulmonary embolus. 2. Bilateral bronchial wall thickening, with scattered areas of bibasilar consolidation and mucous plugging. Findings could reflect postobstructive changes or pneumonia, in a more pronounced than on preceding exam. 3. Stable 4.6 cm ascending thoracic aortic aneurysm. Ascending thoracic aortic aneurysm.  4.  Aortic Atherosclerosis (ICD10-I70.0    Assessment

## 2022-06-27 NOTE — Assessment & Plan Note (Addendum)
Onset p ketuda exposure in setting of long term seasonal rhinitis - Eos 1.7 06/11/2022 > rx short course prednisone only as pt is diabetic - 06/11/22    alpha one AT phenotype   MS  Level 129  - 06/27/2022  After extensive coaching inhaler device,  effectiveness =    90% > try symbicort 80 2bid and complete prednisone rx 06/27/2022   Unusual patter of AB p ketruda with non-specific infiltrates on CT that were not well defined at all on CXR but may have represented mucus plugging as suggested by radiology.    Therefore rec No more [place] for now Try off prednisone as of today Start symbicort 80 2bid  F/u in 4 weeks/ sooner if needed

## 2022-06-28 DIAGNOSIS — C642 Malignant neoplasm of left kidney, except renal pelvis: Secondary | ICD-10-CM | POA: Diagnosis not present

## 2022-07-02 DIAGNOSIS — K439 Ventral hernia without obstruction or gangrene: Secondary | ICD-10-CM | POA: Diagnosis not present

## 2022-07-02 DIAGNOSIS — E119 Type 2 diabetes mellitus without complications: Secondary | ICD-10-CM | POA: Diagnosis not present

## 2022-07-02 DIAGNOSIS — C642 Malignant neoplasm of left kidney, except renal pelvis: Secondary | ICD-10-CM | POA: Diagnosis not present

## 2022-07-02 DIAGNOSIS — R918 Other nonspecific abnormal finding of lung field: Secondary | ICD-10-CM | POA: Diagnosis not present

## 2022-07-02 DIAGNOSIS — I358 Other nonrheumatic aortic valve disorders: Secondary | ICD-10-CM | POA: Diagnosis not present

## 2022-07-02 DIAGNOSIS — I712 Thoracic aortic aneurysm, without rupture, unspecified: Secondary | ICD-10-CM | POA: Diagnosis not present

## 2022-07-04 ENCOUNTER — Encounter: Payer: Self-pay | Admitting: Oncology

## 2022-07-05 ENCOUNTER — Ambulatory Visit
Admission: RE | Admit: 2022-07-05 | Discharge: 2022-07-05 | Disposition: A | Discharge status: Home / Self Care | Payer: BC Managed Care – PPO | Source: Ambulatory Visit | Attending: Thoracic Surgery (Cardiothoracic Vascular Surgery) | Admitting: Thoracic Surgery (Cardiothoracic Vascular Surgery)

## 2022-07-05 DIAGNOSIS — I712 Thoracic aortic aneurysm, without rupture, unspecified: Secondary | ICD-10-CM | POA: Diagnosis not present

## 2022-07-05 DIAGNOSIS — Z905 Acquired absence of kidney: Secondary | ICD-10-CM | POA: Diagnosis not present

## 2022-07-05 DIAGNOSIS — I7121 Aneurysm of the ascending aorta, without rupture: Secondary | ICD-10-CM

## 2022-07-05 DIAGNOSIS — I2510 Atherosclerotic heart disease of native coronary artery without angina pectoris: Secondary | ICD-10-CM | POA: Diagnosis not present

## 2022-07-05 DIAGNOSIS — C642 Malignant neoplasm of left kidney, except renal pelvis: Secondary | ICD-10-CM | POA: Diagnosis not present

## 2022-07-05 DIAGNOSIS — K760 Fatty (change of) liver, not elsewhere classified: Secondary | ICD-10-CM | POA: Diagnosis not present

## 2022-07-05 MED ORDER — IOPAMIDOL (ISOVUE-370) INJECTION 76%
75.0000 mL | Freq: Once | INTRAVENOUS | Status: AC | PRN
  Administered 2022-07-05: 75 mL via INTRAVENOUS

## 2022-07-09 ENCOUNTER — Other Ambulatory Visit: Payer: Self-pay | Admitting: Internal Medicine

## 2022-07-09 DIAGNOSIS — R1900 Intra-abdominal and pelvic swelling, mass and lump, unspecified site: Secondary | ICD-10-CM

## 2022-07-10 ENCOUNTER — Ambulatory Visit (INDEPENDENT_AMBULATORY_CARE_PROVIDER_SITE_OTHER): Payer: BC Managed Care – PPO | Admitting: Thoracic Surgery (Cardiothoracic Vascular Surgery)

## 2022-07-10 ENCOUNTER — Encounter: Payer: Self-pay | Admitting: Thoracic Surgery (Cardiothoracic Vascular Surgery)

## 2022-07-10 VITALS — BP 124/80 | HR 90 | Resp 20 | Ht 70.0 in | Wt 247.0 lb

## 2022-07-10 DIAGNOSIS — I7121 Aneurysm of the ascending aorta, without rupture: Secondary | ICD-10-CM

## 2022-07-10 NOTE — Progress Notes (Signed)
PCP is [name], MD Referring Provider is [name], MD  Chief Complaint  Patient presents with   Thoracic Aortic Aneurysm    6 month f/u with Chest CTA 07/05/22    HPI: Russell Burns sent for consultation regarding an ascending thoracic aneurysm.  Russell Burns is a 56 year old man with a past medical history significant for renal cell carcinoma, status post left nephrectomy, bicuspid aortic valve, moderate aortic stenosis, ascending aneurysm, hypertension, hyperlipidemia, type 2 diabetes, and arthritis.  He does have a strong family history of aneurysmal disease.  He has been followed by Dr. Marlou Porch for his bicuspid aortic valve with moderate AAS and an ascending aortic aneurysm.  Most recently measured 4.6 cm.  He had a left nephrectomy for grade 4 renal cell carcinoma back in June.  He was on Keytruda until September when he was admitted to the hospital for presumed COPD flare.  He had a CT which showed 4.6 cm ascending aneurysm unchanged from previous studies.  He has been under a great deal of stress recently.  His wife was diagnosed with a glioblastoma.  He has had his own personal health issues.  He denies any chest pain, pressure, tightness, or shortness of breath. Past Medical History:  Diagnosis Date   Allergy    Aortic stenosis    Arthritis    Blood transfusion without reported diagnosis    Cancer ([place])    Chronic kidney disease    Diabetes mellitus without complication (HCC)    FH: thoracic aneurysm    Hyperlipidemia    Hypertension    Obesity     Past Surgical History:  Procedure Laterality Date   CYSTOSCOPY WITH URETEROSCOPY AND STENT PLACEMENT Left 02/12/2022   Procedure: CYSTOSCOPY WITH LEFT RETROGRADE PYELOGRAM, DIAGNOSTIC URETEROSCOPY AND STENT PLACEMENT;  Surgeon: Lucas Mallow, MD;  Location: [facility];  Service: Urology;  Laterality: Left;   LAPAROSCOPIC NEPHRECTOMY, HAND ASSISTED Left 03/12/2022   Procedure: LEFT HAND ASSISTED LAPAROSCOPIC  NEPHRECTOMY;  Surgeon: Lucas Mallow, MD;  Location: [facility];  Service: Urology;  Laterality: Left;  NEED 2.5 HRS FOR THIS CASE   TONSILLECTOMY     WISDOM TOOTH EXTRACTION      Family History  Problem Relation Age of Onset   CAD Other    Diabetes Mellitus II Other    Hypertension Other    Heart failure Other    Hypertension Mother    Heart attack Father        80, 39 at both ages   Diabetes Mellitus II Father        insulin dependent   Aneurysm Maternal Uncle        aortic   Heart attack Paternal Uncle 26       minor   Hypertension Paternal Uncle    Heart attack Maternal Grandmother 82       massive   Aneurysm Maternal Grandfather        abdominal   Heart attack Paternal Grandfather 52   Aneurysm Cousin 44       aortic   Colon polyps Neg Hx    Esophageal cancer Neg Hx    Rectal cancer Neg Hx    Stomach cancer Neg Hx     Social History Social History   Tobacco Use   Smoking status: Never   Smokeless tobacco: Current    Types: Chew  Vaping Use   Vaping Use: Never used  Substance Use Topics   Alcohol use: Yes  Comment: 3 drinks a week.    Drug use: No    Current Outpatient Medications  Medication Sig Dispense Refill   acetaminophen (TYLENOL) 500 MG tablet Take 1,000 mg by mouth in the morning.     albuterol (VENTOLIN HFA) 108 (90 Base) MCG/ACT inhaler Inhale 2 puffs into the lungs every 4 (four) hours.     amLODipine (NORVASC) 10 MG tablet Take 10 mg by mouth daily.     atorvastatin (LIPITOR) 80 MG tablet Take 80 mg by mouth daily.     budesonide-formoterol (SYMBICORT) 80-4.5 MCG/ACT inhaler Take 2 puffs first thing in am and then another 2 puffs about 12 hours later. 1 each 12   cetirizine (ZYRTEC) 10 MG tablet Take 10 mg by mouth in the morning.     docusate sodium (COLACE) 100 MG capsule Take 1 capsule (100 mg total) by mouth 2 (two) times daily.     EPINEPHrine 0.3 mg/0.3 mL IJ SOAJ injection Inject 0.3 mg into the muscle as needed for anaphylaxis.      ezetimibe (ZETIA) 10 MG tablet Take 10 mg by mouth daily.     famotidine (PEPCID) 20 MG tablet One after supper (Patient taking differently: Take 20 mg by mouth daily after supper.) 30 tablet 11   fluticasone (FLONASE) 50 MCG/ACT nasal spray Place 2 sprays into both nostrils in the morning and at bedtime.     JARDIANCE 25 MG TABS tablet Take 25 mg by mouth daily.     losartan (COZAAR) 100 MG tablet Take 100 mg by mouth daily.     metFORMIN (GLUCOPHAGE) 1000 MG tablet Take 1,000 mg by mouth 2 (two) times daily with a meal.     oxyCODONE (OXY IR/ROXICODONE) 5 MG immediate release tablet Take 1-2 tablets (5-10 mg total) by mouth every 6 (six) hours as needed for severe pain or moderate pain. (Patient taking differently: Take 5 mg by mouth See admin instructions. Take 5 mg by mouth at bedtime, if taking Promethazine-DM cough syrup) 20 tablet 0   pantoprazole (PROTONIX) 40 MG tablet Take 1 tablet (40 mg total) by mouth daily. Take 30-60 min before first meal of the day (Patient taking differently: Take 40 mg by mouth daily before breakfast. Take 30-60 min before first meal of the day) 30 tablet 2   prochlorperazine (COMPAZINE) 10 MG tablet Take 1 tablet (10 mg total) by mouth every 6 (six) hours as needed for nausea or vomiting. 30 tablet 0   promethazine-dextromethorphan (PROMETHAZINE-DM) 6.25-15 MG/5ML syrup Take 5-10 mLs by mouth 4 (four) times daily as needed for cough. 240 mL 0   REPATHA 140 MG/ML SOSY Inject 140 mg into the skin every 14 (fourteen) days.     tamsulosin (FLOMAX) 0.4 MG CAPS capsule Take 1 capsule (0.4 mg total) by mouth daily. 14 capsule 0   tirzepatide (MOUNJARO) 10 MG/0.5ML Pen Inject 10 mg into the skin once a week. (Patient taking differently: Inject 10 mg into the skin every Friday.) 6 mL 3   No current facility-administered medications for this visit.    Allergies  Allergen Reactions   Bee Venom Anaphylaxis   Other Itching, Swelling and Other (See Comments)    Feline  dander- Runny nose, itchy/puffy eyes    Review of Systems  Neurological:  Positive for numbness.    BP 124/80   Pulse 90   Resp 20   Ht '5\' 10"'$  (1.778 m)   Wt 247 lb (112 kg)   SpO2 96% Comment: RA  BMI  35.44 kg/m  Physical Exam Vitals reviewed.  Constitutional:      General: He is not in acute distress.    Appearance: Normal appearance.  HENT:     Head: Normocephalic and atraumatic.  Eyes:     General: No scleral icterus.    Extraocular Movements: Extraocular movements intact.  Cardiovascular:     Rate and Rhythm: Normal rate and regular rhythm.     Heart sounds: Murmur (2/6 systolic murmur) heard.  Pulmonary:     Effort: Pulmonary effort is normal. No respiratory distress.     Breath sounds: Normal breath sounds. No wheezing.  Abdominal:     General: There is no distension.     Palpations: Abdomen is soft.  Skin:    General: Skin is warm and dry.  Neurological:     General: No focal deficit present.     Mental Status: He is alert and oriented to person, place, and time.     Cranial Nerves: No cranial nerve deficit.     Motor: No weakness.    Diagnostic Tests: CT ANGIOGRAPHY CHEST WITH CONTRAST   TECHNIQUE: Multidetector CT imaging of the chest was performed using the standard protocol during bolus administration of intravenous contrast. Multiplanar CT image reconstructions and MIPs were obtained to evaluate the vascular anatomy.   RADIATION DOSE REDUCTION: This exam was performed according to the departmental dose-optimization program which includes automated exposure control, adjustment of the mA and/or kV according to patient size and/or use of iterative reconstruction technique.   CONTRAST:  30m ISOVUE-370 IOPAMIDOL (ISOVUE-370) INJECTION 76%   COMPARISON:  Previous studies including the CT chest done on 06/14/2022 and CT abdomen done on 12/26/2021   FINDINGS: Cardiovascular: There is homogeneous enhancement in thoracic aorta. There is aneurysmal  dilation of ascending thoracic aorta measuring 4.5 cm. Coronary artery calcifications are seen. There are no intraluminal filling defects in pulmonary artery branches. Hemi azygous continues in left side of mediastinum joining the left subclavian.   Mediastinum/Nodes: No new significant lymphadenopathy seen. Thyroid appears smaller than usual.   Lungs/Pleura: There are no focal pulmonary infiltrates. There are no discrete lung nodules. There is no pleural effusion or pneumothorax.   Upper Abdomen: There is fatty infiltration in liver. There is 4.6 cm area of subtle decreased density inseparable from the left psoas muscle.   Musculoskeletal: There are sclerotic densities in the posterior aspects of bodies of T6, T7, T8, T10 and T11 vertebrae. This finding was not seen on 06/14/2022. There are ectatic vascular structures in lower neck, paraspinal regions and superior mediastinum which were not seen in the previous examination.   Review of the MIP images confirms the above findings.   IMPRESSION: There is no evidence of pulmonary artery embolism. There is no evidence of thoracic aortic dissection. There is 4.5 cm aneurism in ascending thoracic aorta with no significant change.   There is no focal pulmonary consolidation. There is no pleural effusion or pneumothorax.   There is 4.6 cm low-density structure inseparable from the upper aspect of left psoas muscle. This finding is not fully evaluated. Differential diagnostic possibilities would include neoplastic process in left adrenal or postoperative changes in left renal fossa following left nephrectomy. Follow-up CT abdomen and pelvis should be considered for further characterization.   Hyperdense structures are noted in the posterior aspects of bodies of multiple thoracic vertebrae. This finding may be due to abnormal vascular enhancement or sclerotic metastatic disease. There are tortuous collateral vessels in lower neck,  superior mediastinum and  paraspinal regions. Possibility of narrowing or occlusion of venous structures not included in the area examination is not excluded. As far as seen, superior vena cava appears patent.     Electronically Signed   By: Elmer Picker M.D.   On: 07/05/2022 10:40 I personally reviewed the CT images.  There is a 4.6 cm ascending aneurysm.  Other findings as noted by radiology.  Impression: Russell Burns Burns is a 56 year old man with a past medical history significant for renal cell carcinoma, status post left nephrectomy, bicuspid aortic valve, moderate aortic stenosis, ascending aneurysm, hypertension, hyperlipidemia, type 2 diabetes, and arthritis.    Ascending aneurysm-has been stable at 4.6 cm.  Needs continued semiannual follow-up.  He understands importance of blood pressure control.  He prefers to follow-up with Dr. Marlou Porch.  This fine with me as long as Dr. Marlou Porch is comfortable doing so.  Bicuspid aortic valve with moderate stenosis-followed by Dr. Marlou Porch.  Renal cell carcinoma-status post nephrectomy.  Was treated with [facility] but now has been stopped due to respiratory issues.  Follow-up by Dr. Gloriann Loan and Dr. Alen Blew.  Sclerotic vertebral lesion seen on CT-he is scheduled to have an MRI.  He has a follow-up with Dr. Alen Blew in the near future.   Plan: CT angio in 6 months.  He can follow-up with Dr. Marlou Porch if he prefers.  Melrose Nakayama, MD [facility] [phone]

## 2022-07-12 ENCOUNTER — Ambulatory Visit: Payer: BC Managed Care – PPO

## 2022-07-12 ENCOUNTER — Inpatient Hospital Stay: Payer: BC Managed Care – PPO

## 2022-07-12 ENCOUNTER — Ambulatory Visit: Payer: BC Managed Care – PPO | Admitting: Oncology

## 2022-07-12 ENCOUNTER — Inpatient Hospital Stay: Payer: BC Managed Care – PPO | Attending: Oncology | Admitting: Oncology

## 2022-07-12 ENCOUNTER — Other Ambulatory Visit: Payer: BC Managed Care – PPO

## 2022-07-12 ENCOUNTER — Other Ambulatory Visit: Payer: Self-pay

## 2022-07-12 DIAGNOSIS — C642 Malignant neoplasm of left kidney, except renal pelvis: Secondary | ICD-10-CM

## 2022-07-12 DIAGNOSIS — Z79899 Other long term (current) drug therapy: Secondary | ICD-10-CM | POA: Diagnosis not present

## 2022-07-12 LAB — CBC WITH DIFFERENTIAL (CANCER CENTER ONLY)
Abs Immature Granulocytes: 0.01 10*3/uL (ref 0.00–0.07)
Basophils Absolute: 0.1 10*3/uL (ref 0.0–0.1)
Basophils Relative: 1 %
Eosinophils Absolute: 0.2 10*3/uL (ref 0.0–0.5)
Eosinophils Relative: 3 %
HCT: 44.3 % (ref 39.0–52.0)
Hemoglobin: 15.1 g/dL (ref 13.0–17.0)
Immature Granulocytes: 0 %
Lymphocytes Relative: 30 %
Lymphs Abs: 1.8 10*3/uL (ref 0.7–4.0)
MCH: 31 pg (ref 26.0–34.0)
MCHC: 34.1 g/dL (ref 30.0–36.0)
MCV: 91 fL (ref 80.0–100.0)
Monocytes Absolute: 0.4 10*3/uL (ref 0.1–1.0)
Monocytes Relative: 7 %
Neutro Abs: 3.6 10*3/uL (ref 1.7–7.7)
Neutrophils Relative %: 59 %
Platelet Count: 224 10*3/uL (ref 150–400)
RBC: 4.87 MIL/uL (ref 4.22–5.81)
RDW: 12.1 % (ref 11.5–15.5)
WBC Count: 6.1 10*3/uL (ref 4.0–10.5)
nRBC: 0 % (ref 0.0–0.2)

## 2022-07-12 LAB — CMP (CANCER CENTER ONLY)
ALT: 23 U/L (ref 0–44)
AST: 15 U/L (ref 15–41)
Albumin: 4.4 g/dL (ref 3.5–5.0)
Alkaline Phosphatase: 57 U/L (ref 38–126)
Anion gap: 11 (ref 5–15)
BUN: 19 mg/dL (ref 6–20)
CO2: 23 mmol/L (ref 22–32)
Calcium: 9.3 mg/dL (ref 8.9–10.3)
Chloride: 102 mmol/L (ref 98–111)
Creatinine: 1.4 mg/dL — ABNORMAL HIGH (ref 0.61–1.24)
GFR, Estimated: 59 mL/min — ABNORMAL LOW (ref >60)
Glucose, Bld: 168 mg/dL — ABNORMAL HIGH (ref 70–99)
Potassium: 4.3 mmol/L (ref 3.5–5.1)
Sodium: 136 mmol/L (ref 135–145)
Total Bilirubin: 0.4 mg/dL (ref 0.3–1.2)
Total Protein: 7 g/dL (ref 6.5–8.1)

## 2022-07-12 LAB — TSH: TSH: 3.689 u[IU]/mL (ref 0.350–4.500)

## 2022-07-12 NOTE — Progress Notes (Signed)
Hematology and Oncology Follow Up Visit  Russell Burns [id number] [birth year] 56 y.o. 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     Prior Therapy: He is status post left laparoscopic radical nephrectomy completed on 11/12/2021 under the care of Dr. Gloriann Loan.  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    Current therapy: Pembrolizumab 200 mg every 3 weeks started on May 10, 2022.  He is here for cycle 3.  Interim History: Russell Burns is here for follow-up evaluation.  Since last visit, he was hospitalized for respiratory distress and COPD exacerbation and was discharged on June 19, 2022.  He feels well at this time and has been tapered off of prednisone and breathing has resumed to normal range.  He did have a repeat scan for his aneurysm which showed vertebral body changes as well as a psoas muscle abnormality that are unclear.  He is having dedicated scan scheduled next week.     Medications: Updated on review. Current Outpatient Medications  Medication Sig Dispense Refill   acetaminophen (TYLENOL) 500 MG tablet Take 1,000 mg by mouth in the morning.     albuterol (VENTOLIN HFA) 108 (90 Base) MCG/ACT inhaler Inhale 2 puffs into the lungs every 4 (four) hours.     amLODipine (NORVASC) 10 MG tablet Take 10 mg by mouth daily.     atorvastatin (LIPITOR) 80 MG tablet Take 80 mg by mouth daily.     budesonide-formoterol (SYMBICORT) 80-4.5 MCG/ACT inhaler Take 2 puffs first thing in am and then another 2 puffs about 12 hours later. 1 each 12   cetirizine (ZYRTEC) 10 MG tablet Take 10 mg by mouth in the morning.     docusate sodium (COLACE) 100 MG capsule Take 1 capsule (100 mg total) by mouth 2 (two) times daily.     EPINEPHrine 0.3 mg/0.3 mL IJ SOAJ injection Inject  0.3 mg into the muscle as needed for anaphylaxis.     ezetimibe (ZETIA) 10 MG tablet Take 10 mg by mouth daily.     famotidine (PEPCID) 20 MG tablet One after supper (Patient taking differently: Take 20 mg by mouth daily after supper.) 30 tablet 11   fluticasone (FLONASE) 50 MCG/ACT nasal spray Place 2 sprays into both nostrils in the morning and at bedtime.     JARDIANCE 25 MG TABS tablet Take 25 mg by mouth daily.     losartan (COZAAR) 100 MG tablet Take 100 mg by mouth daily.     metFORMIN (GLUCOPHAGE) 1000 MG tablet Take 1,000 mg by mouth 2 (two) times daily with a meal.     oxyCODONE (OXY IR/ROXICODONE) 5 MG immediate release tablet Take 1-2 tablets (5-10 mg total) by mouth every 6 (six) hours as needed for severe pain or moderate pain. (Patient taking differently: Take 5 mg by mouth See admin instructions. Take 5 mg by mouth at bedtime, if taking Promethazine-DM cough syrup) 20 tablet 0   pantoprazole (PROTONIX) 40 MG tablet Take 1 tablet (40 mg total) by mouth daily. Take 30-60 min before first meal of the day (Patient taking differently: Take 40 mg by mouth daily before breakfast. Take 30-60 min before first meal of the day) 30 tablet 2   prochlorperazine (COMPAZINE) 10 MG tablet Take 1 tablet (10 mg total) by mouth  every 6 (six) hours as needed for nausea or vomiting. 30 tablet 0   promethazine-dextromethorphan (PROMETHAZINE-DM) 6.25-15 MG/5ML syrup Take 5-10 mLs by mouth 4 (four) times daily as needed for cough. 240 mL 0   REPATHA 140 MG/ML SOSY Inject 140 mg into the skin every 14 (fourteen) days.     tamsulosin (FLOMAX) 0.4 MG CAPS capsule Take 1 capsule (0.4 mg total) by mouth daily. 14 capsule 0   tirzepatide (MOUNJARO) 10 MG/0.5ML Pen Inject 10 mg into the skin once a week. (Patient taking differently: Inject 10 mg into the skin every Friday.) 6 mL 3   No current facility-administered medications for this visit.     Allergies:  Allergies  Allergen Reactions   Bee Venom  Anaphylaxis   Other Itching, Swelling and Other (See Comments)    Feline dander- Runny nose, itchy/puffy eyes     Physical Exam: Blood pressure 124/76, pulse 92, temperature 97.8 F (36.6 C), temperature source Temporal, resp. rate 18, height '5\' 10"'$  (1.778 m), weight 248 lb 14.4 oz (112.9 kg), SpO2 97 %.  ECOG: 1    General appearance: Alert, awake without any distress. Head: Atraumatic without abnormalities Oropharynx: Without any thrush or ulcers. Eyes: No scleral icterus. Lymph nodes: No lymphadenopathy noted in the cervical, supraclavicular, or axillary nodes Heart:regular rate and rhythm, without any murmurs or gallops.   Lung: Clear to auscultation without any rhonchi, wheezes or dullness to percussion. Abdomin: Soft, nontender without any shifting dullness or ascites. Musculoskeletal: No clubbing or cyanosis. Neurological: No motor or sensory deficits. Skin: No rashes or lesions.      Lab Results: Lab Results  Component Value Date   WBC 10.8 (H) 06/19/2022   HGB 16.4 06/19/2022   HCT 49.5 06/19/2022   MCV 93.8 06/19/2022   PLT 308 06/19/2022     Chemistry      Component Value Date/Time   NA 141 06/19/2022 0514   NA 140 10/13/2021 1309   K 4.0 06/19/2022 0514   CL 107 06/19/2022 0514   CO2 25 06/19/2022 0514   BUN 26 (H) 06/19/2022 0514   BUN 14 10/13/2021 1309   CREATININE 1.11 06/19/2022 0514   CREATININE 1.35 (H) 05/31/2022 0902      Component Value Date/Time   CALCIUM 9.6 06/19/2022 0514   ALKPHOS 65 06/15/2022 0511   AST 24 06/15/2022 0511   AST 14 (L) 05/31/2022 0902   ALT 25 06/15/2022 0511   ALT 19 05/31/2022 0902   BILITOT 0.7 06/15/2022 0511   BILITOT 0.3 05/31/2022 0902         Impression and Plan:  56 year old with:  1.  Kidney cancer diagnosed in May 2023.  He was found to have T3a renal cell carcinoma with nuclear grade 4.    Risks and benefits of resuming Pembrolizumab were discussed at this time.  It is unclear if his  respiratory complaints are related to pneumonitis as an autoimmune complication.  After discussion today, I have opted to hold treatment at this time given the possibility of pulmonary complications related to this treatment.  Additional systemic therapy may be required especially if his work-up did reveal metastatic disease based on his recent imaging studies.  Different treatment options including oral targeted therapy alone or in combination with Pembrolizumab could be considered.      2.  Antiemetics: No nausea or vomiting reported at this time.  3.  IV access: Peripheral veins are continue to be used at this time.  4.  Immune  mediated issues: Complications such as hepatitis, colitis, hypophysitis among others were reiterated.   5.  Follow-up: He will return in 3 weeks for a follow-up evaluation.   30  minutes were gated to this encounter..  The time was spent on discussing his treatment choices, addressing complications related to cancer and cancer therapy.   Zola Button, MD 10/19/202312:54 PM

## 2022-07-17 ENCOUNTER — Ambulatory Visit
Admission: RE | Admit: 2022-07-17 | Discharge: 2022-07-17 | Disposition: A | Discharge status: Home / Self Care | Payer: BC Managed Care – PPO | Source: Ambulatory Visit | Attending: Internal Medicine | Admitting: Internal Medicine

## 2022-07-17 DIAGNOSIS — K429 Umbilical hernia without obstruction or gangrene: Secondary | ICD-10-CM | POA: Diagnosis not present

## 2022-07-17 DIAGNOSIS — K5730 Diverticulosis of large intestine without perforation or abscess without bleeding: Secondary | ICD-10-CM | POA: Diagnosis not present

## 2022-07-17 DIAGNOSIS — N3289 Other specified disorders of bladder: Secondary | ICD-10-CM | POA: Diagnosis not present

## 2022-07-17 DIAGNOSIS — R1900 Intra-abdominal and pelvic swelling, mass and lump, unspecified site: Secondary | ICD-10-CM

## 2022-07-17 DIAGNOSIS — N289 Disorder of kidney and ureter, unspecified: Secondary | ICD-10-CM | POA: Diagnosis not present

## 2022-07-17 MED ORDER — IOPAMIDOL (ISOVUE-300) INJECTION 61%
100.0000 mL | Freq: Once | INTRAVENOUS | Status: AC | PRN
  Administered 2022-07-17: 100 mL via INTRAVENOUS

## 2022-07-19 DIAGNOSIS — I5032 Chronic diastolic (congestive) heart failure: Secondary | ICD-10-CM | POA: Diagnosis not present

## 2022-07-19 DIAGNOSIS — J9601 Acute respiratory failure with hypoxia: Secondary | ICD-10-CM | POA: Diagnosis not present

## 2022-07-23 DIAGNOSIS — I5032 Chronic diastolic (congestive) heart failure: Secondary | ICD-10-CM | POA: Diagnosis not present

## 2022-07-23 DIAGNOSIS — J9601 Acute respiratory failure with hypoxia: Secondary | ICD-10-CM | POA: Diagnosis not present

## 2022-07-25 ENCOUNTER — Encounter: Payer: Self-pay | Admitting: Internal Medicine

## 2022-07-25 ENCOUNTER — Encounter: Payer: Self-pay | Admitting: Cardiology

## 2022-07-25 ENCOUNTER — Ambulatory Visit: Payer: BC Managed Care – PPO | Attending: Cardiology

## 2022-07-25 ENCOUNTER — Ambulatory Visit (INDEPENDENT_AMBULATORY_CARE_PROVIDER_SITE_OTHER): Payer: BC Managed Care – PPO | Admitting: Internal Medicine

## 2022-07-25 VITALS — BP 118/74 | HR 86 | Temp 97.6°F | Ht 70.0 in | Wt 247.0 lb

## 2022-07-25 DIAGNOSIS — J9601 Acute respiratory failure with hypoxia: Secondary | ICD-10-CM | POA: Diagnosis not present

## 2022-07-25 DIAGNOSIS — J4489 Other specified chronic obstructive pulmonary disease: Secondary | ICD-10-CM | POA: Diagnosis not present

## 2022-07-25 DIAGNOSIS — Z01812 Encounter for preprocedural laboratory examination: Secondary | ICD-10-CM

## 2022-07-25 LAB — BASIC METABOLIC PANEL
BUN/Creatinine Ratio: 18 (ref 9–20)
BUN: 23 mg/dL (ref 6–24)
CO2: 29 mmol/L (ref 20–29)
Calcium: 10 mg/dL (ref 8.7–10.2)
Chloride: 101 mmol/L (ref 96–106)
Creatinine, Ser: 1.27 mg/dL (ref 0.76–1.27)
Glucose: 132 mg/dL — ABNORMAL HIGH (ref 70–99)
Potassium: 5.4 mmol/L — ABNORMAL HIGH (ref 3.5–5.2)
Sodium: 139 mmol/L (ref 134–144)
eGFR: 67 mL/min/{1.73_m2} (ref >59)

## 2022-07-25 LAB — NITRIC OXIDE: Nitric Oxide: 29

## 2022-07-25 NOTE — Assessment & Plan Note (Addendum)
Onset p ketuda exposure in setting of long term seasonal rhinitis - Eos 1.7 06/11/2022 > rx short course prednisone only as pt is diabetic - 06/11/22    alpha one AT phenotype   MS  Level 129  - 06/27/2022    try symbicort 80 2bid and complete prednisone rx 06/27/2022  - 07/25/2022  After extensive coaching inhaler device,  effectiveness =    90+ % with hfa  - 07/25/2022  FENO 29 on symb 80 2 bid > continue   All goals of chronic asthma control met including optimal function and elimination of symptoms with minimal need for rescue therapy.  Contingencies discussed in full including contacting this office immediately if not controlling the symptoms using the rule of two's.

## 2022-07-25 NOTE — Patient Instructions (Signed)
No change other than discontinue 02   Check 02 sats at peak exercise once a week is plenty   Please schedule a follow up visit in 3 months but call sooner if needed

## 2022-07-25 NOTE — Assessment & Plan Note (Signed)
See admit 06/14/22  ? [place] pulmonary toxicity > d/c on 3lpm NP  - d/c 02 07/25/2022 with not desats "walking all day" reported by pt   Advised to continue checking sats at peak ex once a week and prn sob  F/u q 29m sooner prn         Each maintenance medication was reviewed in detail including emphasizing most importantly the difference between maintenance and prns and under what circumstances the prns are to be triggered using an action plan format where appropriate.  Total time for H and P, chart review, counseling, reviewing hfa/02 device(s) and generating customized AVS unique to this office visit / same day charting = 30 min

## 2022-07-25 NOTE — Progress Notes (Signed)
Russell Burns, male    DOB: 1966/07/10   MRN: [id number]   Brief patient profile:  64   yowm never smoker seasonal rhinitis spring > fall rx drixoral  referred to pulmonary clinic 06/11/2022 by Russell Burns for sob/ wife Russell Burns  Has had good ex tolerance / very active up and down steps then 1st covid Dec 2020 got infusion sick 3 weeks but back to baseline then 2nd infection just sniffles oct 2022 then Jan 2023 severe coughing fits with sob mostly daytime then April  2023 dx renal cell  cancer cough syncope   > admit     Admit date: 01/01/2022 Discharge date: 01/07/2022   Time spent: 50 minutes   Recommendations for Outpatient Follow-up:      Acute respiratory failure with hypoxia ([place]) -Presented with hypoxia down to 88 to 89% requiring 3 L via nasal cannula.  He reports about 4 months of symptoms of nonproductive cough with exertional dyspnea and orthopnea.  CTA of the chest is negative for pulmonary embolism but showed findings suggestive of small airway disease with mucous plugging of the lower lobes.  CTA chest in January  showed airway debris bilaterally concerning for bronchitis versus aspiration.  He has no formal diagnosis of COPD and only has remote history of tobacco use 20 years ago,but reports -maternal uncle with COPD due to alpha antitrypsin deficiency. -4/11 Alpha-1 antitrypsin pending -Unclear if this is really COPD. he will need outpatient PFT   -DuoNeb QID -Trial of nebulized hypertonic saline and inhaled steroid for mucolytic.  -4/11 chest physiotherapy TID.  -daily IV Solu-Medrol '60mg'$  -4/12 resolved: DC Solu-Medrol - 4/12 Prednisone 50 mg x 5 days    Left Renal mass/suspicious cyst of the upper pole RIGHT kidney -4/11 review of CT scanning of abdomen 4/4 shows mass in LEFT kidney consistent with malignancy, and suspicious mass in right kidney. - 4/12 discussed case with Dr Diona Fanti [facility] stated equipment required to remove kidney not in hospital.  Patient to  follow-up upon discharge, to discuss  LEFT kidney resection -Follow-up with urologist ASAP upon discharge   HLD (hyperlipidemia) Continue home meds once med rec is complete   Thoracic aortic aneurysm ([place]) -Stable 4.6 cm ascending aortic aneurysm.  See CTA PE protocol below -4/11 patient never seen cardiothoracic surgery, and has SIGNIFICANT family history of ruptured AAA in his family. - 4/12 discussed case with Dr. Roxan Hockey Cardiothoracic Surgery. patient is to follow-up with him in 6 months.     Chronic diastolic CHF/Moderate AS -Echo in 09/2021 with EF of 55 to 60% and grade 1 diastolic dysfunction. - If symptoms do not improve with pulmonary therapy, could consider consult to cardiology to discuss whether symptoms are related to his moderate aortic stenosis.     Essential hypertension -BP goal <120/80 if patient can tolerate, secondary to AAA. - Amlodipine 10 mg daily - Losartan 100 mg daily -Hydralazine PRN SBP> 130 or DBP> 90 -4/12 BP fairly well controlled on current medication   Diabetes mellitus without complication (HCC) - Discharged on home medication   Obese (BMI 34.4 kg/m) -Discuss weight loss strategies with PCP     Discharge Diagnoses:  Principal Problem:   Acute respiratory failure with hypoxia ([place])   Diabetes mellitus without complication ([place])   Essential hypertension   Thoracic aortic aneurysm (HCC)   HLD (hyperlipidemia)   Left renal mass   Chronic diastolic CHF (congestive heart failure) ([place])      History of Present Illness  06/11/2022  Pulmonary/  1st office eval/Russell Burns  was 80% better p admit until a week p 1st use of Keytruda which was on 05/10/22  Chief Complaint  Patient presents with   Consult    Referred by PCP for cough and increased SOB. States this has been going on since January 2023. Was in the hospital back in April 2023 for the same concern.   Dyspnea:  MMRC3 = can't walk 100 yards even at a slow pace at a flat grade s stopping due  to sob   Cough: worse at hs dry with subjective wheeze  Sleep: bolt upright x 2 weeks  SABA use: anoro daily/ saba not really sure it's helping  Rec Prednisone 10 mg Take 4 for three days 3 for three days 2 for three days 1 for three days and stop  For cough > Promethazine DM 2 tsp four times daily and supplement with oxycodone 5 mg every 4 hours as needed  For tickle >  For drainage / throat tickle try take CHLORPHENIRAMINE  4 mg Prilosec  (20 x 2) 40 mg  (Pantoprazole 40 mg)   Take  30-60 min before first meal of the day and Pepcid (famotidine)  20 mg after supper until return to office - this is the best way to tell whether stomach acid is contributing to your problem.   GERD diet No more [place] or anoro Ok to use albuterol up to 2 puffs every 4 hours if you feel it helps your breathing or cough  Please schedule a follow up office visit in 2 weeks, sooner if needed- to [facility] ER in meantime if getting worse or 02 sats on RA < 90% at rest   Admit date: 06/14/2022 Discharge date: 06/19/2022   Admitted From: Home Disposition:  Home    Brief/Interim Summary:   Patient is a 56 year old male with history of renal cell carcinoma on Keytruda, thoracic aortic aneurysm, hyperlipidemia, hypertension, diabetes type 2 presented with 1 week history of shortness of breath, cough.  Patient was also recently prescribed steroids by his pulmonologist for suspicion of COPD.  On presentation ,he was hemodynamically stable.  Required 2 to 5 L of oxygen for maintenance of saturation.  Chest x-ray did not show any acute findings.  CT PE study was negative for PE but showed bilateral bronchial wall thickening, scattered bibasilar consolidation, mucous plugging.  Imaging suspicious for postobstructive pneumonia.  Patient was started on broad spectrum antibiotics, bronchodilators.  Respiratory status significantly improving.  Currently down to 3 L.  He qualified for home oxygen.  He is medically stable for discharge home  today.  He will follow-up with Russell Burns as an outpatient.     Following problems were addressed during his hospitalization:   Acute hypoxic respiratory failure: Presented with shortness of breath, cough.  Not on oxygen at home.  Required 2 to 5 L of oxygen on presentation.  Patient was started on short course of steroids from outpatient pulmonology without improvement. Chest x-ray did not show any acute findings.  CT PE study was negative for PE but showed bilateral bronchial wall thickening, scattered bibasilar consolidation, mucous plugging.  Imaging suspicious for postobstructive pneumonia.  Patient was started on broad spectrum antibiotics, bronchodilators.  COVID, respiratory viral panel negative.  Patient is bieng treated for renal cell carcinoma so there is possibility of pneumonitis as well.  Being work-up for diagnosis of COPD as an outpatient.  Imaging showed bronchial wall thickening.  Treated with steroids, bronchodilator.  Procalcitonin non-reassuring. He feels  much better .  We requested for chest physiotherapy. Continue incentive spirometer, flutter valve.  Current down to 2 L.  He qualified for home oxygen for 3 L .  Continue prednisone on tapering dose.     Renal cell carcinoma: Currently on Keytruda, follows with Dr. Alen Blew.  Status post left nephrectomy.  Pathology showed clear-cell renal cell carcinoma.  Continue home tamsulosin   History of thoracic aortic aneurysm: Stable.  Imaging showed 4.6 cm ascending thoracic aortic aneurysm.  Semiannual follow-up with CTA/MRI recommended.  He needs to follow-up with vascular surgery as an outpatient.  He is scheduled to follow-up with Dr. Roxan Hockey in 6 months    History of diastolic CHF: Last echo showed EF of 55 to 30%, grade 1 diastolic dysfunction.  Currently not on diuretics.          Discharge Diagnoses:  Principal Problem:   Acute respiratory failure with hypoxia (HCC) Active Problems:   Diabetes mellitus without  complication (HCC)   Essential hypertension   HLD (hyperlipidemia)   Chronic diastolic CHF (congestive heart failure) ([place])   Malignant neoplasm of left kidney (HCC)   GERD (gastroesophageal reflux disease)       06/27/2022  f/u ov/Russell Burns re: acute resp failure   maint on prn ventolin and last dose of pred 06/27/2022  Chief Complaint  Patient presents with   Follow-up    Better 94%, sob and cough-gray better   Dyspnea:  across parking lots ok with or without 02  Cough: a bit of grey mucus still mostly in am  Sleeping: recliner 20 degrees = baseline  SABA use: twice daily  02: 3lpm hs and prn  Covid status:  vax max  Rec Plan A = Automatic = Always=    Symbicort 80 Take 2 puffs first thing in am and then another 2 puffs about 12 hours later.   Work on inhaler technique:  Plan B = Backup (to supplement plan A, not to replace it) Only use your albuterol inhaler as a rescue medication  Ok to try albuterol 15 min before an activity (on alternating days)  that you know would usually make you short of breath Make sure you check your oxygen saturation  AT  your highest level of activity (not after you stop)   to be sure it stays over 90%  Ok to return to work without restrictions Jul 02 2022 but you will access to 02 and your rescue inhaler     07/25/2022  f/u ov/Russell Burns re: acute resp failure secondary to Keytruda pneumonitis  maint on symb 80 2bid    Chief Complaint  Patient presents with   Follow-up    Breathing is much improved. He has not had to use albuterol in the past 2 wks.   Dyspnea:  walking all over the plant, including steps Cough: resolved on h1 bid  Sleeping: on side bed flat  SABA use: not 02: not  Covid status:   vax x 4      No obvious day to day or daytime variability or assoc excess/ purulent sputum or mucus plugs or hemoptysis or cp or chest tightness, subjective wheeze or overt sinus or hb symptoms.   Sleeping as above  without nocturnal  or early am  exacerbation  of respiratory  c/o's or need for noct saba. Also denies any obvious fluctuation of symptoms with weather or environmental changes or other aggravating or alleviating factors except as outlined above   No unusual exposure hx or h/o  childhood pna/ asthma or knowledge of premature birth.  Current Allergies, Complete Past Medical History, Past Surgical History, Family History, and Social History were reviewed in [employer] record.  ROS  The following are not active complaints unless bolded Hoarseness, sore throat, dysphagia, dental problems, itching, sneezing,  nasal congestion or discharge of excess mucus or purulent secretions, ear ache,   fever, chills, sweats, unintended wt loss or wt gain, classically pleuritic or exertional cp,  orthopnea pnd or arm/hand swelling  or leg swelling, presyncope, palpitations, abdominal pain, anorexia, nausea, vomiting, diarrhea  or change in bowel habits or change in bladder habits, change in stools or change in urine, dysuria, hematuria,  rash, arthralgias, visual complaints, headache, numbness, weakness or ataxia or problems with walking or coordination,  change in mood or  memory.        Current Meds  Medication Sig   acetaminophen (TYLENOL) 500 MG tablet Take 1,000 mg by mouth in the morning.   albuterol (VENTOLIN HFA) 108 (90 Base) MCG/ACT inhaler Inhale 2 puffs into the lungs every 4 (four) hours.   amLODipine (NORVASC) 10 MG tablet Take 10 mg by mouth daily.   atorvastatin (LIPITOR) 80 MG tablet Take 80 mg by mouth daily.   budesonide-formoterol (SYMBICORT) 80-4.5 MCG/ACT inhaler Take 2 puffs first thing in am and then another 2 puffs about 12 hours later.   cetirizine (ZYRTEC) 10 MG tablet Take 10 mg by mouth in the morning.   docusate sodium (COLACE) 100 MG capsule Take 1 capsule (100 mg total) by mouth 2 (two) times daily.   EPINEPHrine 0.3 mg/0.3 mL IJ SOAJ injection Inject 0.3 mg into the muscle as needed for  anaphylaxis.   ezetimibe (ZETIA) 10 MG tablet Take 10 mg by mouth daily.   famotidine (PEPCID) 20 MG tablet One after supper (Patient taking differently: Take 20 mg by mouth daily after supper.)   fluticasone (FLONASE) 50 MCG/ACT nasal spray Place 2 sprays into both nostrils in the morning and at bedtime.   JARDIANCE 25 MG TABS tablet Take 25 mg by mouth daily.   losartan (COZAAR) 100 MG tablet Take 100 mg by mouth daily.   metFORMIN (GLUCOPHAGE) 1000 MG tablet Take 1,000 mg by mouth 2 (two) times daily with a meal.   oxyCODONE (OXY IR/ROXICODONE) 5 MG immediate release tablet Take 1-2 tablets (5-10 mg total) by mouth every 6 (six) hours as needed for severe pain or moderate pain. (Patient taking differently: Take 5 mg by mouth See admin instructions. Take 5 mg by mouth at bedtime, if taking Promethazine-DM cough syrup)   pantoprazole (PROTONIX) 40 MG tablet Take 1 tablet (40 mg total) by mouth daily. Take 30-60 min before first meal of the day (Patient taking differently: Take 40 mg by mouth daily before breakfast. Take 30-60 min before first meal of the day)   prochlorperazine (COMPAZINE) 10 MG tablet Take 1 tablet (10 mg total) by mouth every 6 (six) hours as needed for nausea or vomiting.   promethazine-dextromethorphan (PROMETHAZINE-DM) 6.25-15 MG/5ML syrup Take 5-10 mLs by mouth 4 (four) times daily as needed for cough.   REPATHA 140 MG/ML SOSY Inject 140 mg into the skin every 14 (fourteen) days.   tamsulosin (FLOMAX) 0.4 MG CAPS capsule Take 1 capsule (0.4 mg total) by mouth daily.   tirzepatide (MOUNJARO) 10 MG/0.5ML Pen Inject 10 mg into the skin once a week. (Patient taking differently: Inject 10 mg into the skin every Friday.)  Past Medical History:  Diagnosis Date   Allergy    Aortic stenosis    Arthritis    Blood transfusion without reported diagnosis    Cancer ([place])    Chronic kidney disease    Diabetes mellitus without complication ([place])    FH: thoracic aneurysm     Hyperlipidemia    Hypertension    Obesity        Objective:       07/25/2022       247  06/27/2022       247   06/11/22 235 lb 6.4 oz (106.8 kg)  05/31/22 239 lb 6.4 oz (108.6 kg)  05/10/22 239 lb (108.4 kg)     Vital signs reviewed  07/25/2022  - Note at rest 02 sats  98% on RA   General appearance:    amb mod obese wm nad    HEENT : Oropharynx  clear      Nasal turbinates nl    NECK :  without  apparent JVD/ palpable Nodes/TM    LUNGS: no acc muscle use,  Nl contour chest which is clear to A and P bilaterally without cough on insp or exp maneuvers   CV:  RRR  no s3 or murmur or increase in P2, and no edema   ABD:  soft and nontender    MS:  Nl gait/ ext warm without deformities Or obvious joint restrictions  calf tenderness, cyanosis or clubbing    SKIN: warm and dry without lesions    NEURO:  alert, approp, nl sensorium with  no motor or cerebellar deficits apparent.        I personally reviewed images and agree with radiology impression as follows:   Chest CTa 07/05/22  There is no evidence of pulmonary artery embolism. There is no evidence of thoracic aortic dissection. There is 4.5 cm aneurism in ascending thoracic aorta with no significant change. There is no focal pulmonary consolidation. There is no pleural effusion or pneumothorax.    Assessment

## 2022-07-26 ENCOUNTER — Other Ambulatory Visit: Payer: Self-pay

## 2022-07-30 ENCOUNTER — Inpatient Hospital Stay: Admission: RE | Admit: 2022-07-30 | Payer: BC Managed Care – PPO | Source: Ambulatory Visit

## 2022-08-02 ENCOUNTER — Inpatient Hospital Stay: Payer: BC Managed Care – PPO | Attending: Oncology

## 2022-08-02 ENCOUNTER — Inpatient Hospital Stay: Payer: BC Managed Care – PPO

## 2022-08-02 ENCOUNTER — Inpatient Hospital Stay (HOSPITAL_BASED_OUTPATIENT_CLINIC_OR_DEPARTMENT_OTHER): Payer: BC Managed Care – PPO | Admitting: Oncology

## 2022-08-02 VITALS — BP 142/90 | HR 101 | Temp 98.2°F | Resp 16 | Wt 244.4 lb

## 2022-08-02 VITALS — HR 95

## 2022-08-02 DIAGNOSIS — C642 Malignant neoplasm of left kidney, except renal pelvis: Secondary | ICD-10-CM

## 2022-08-02 DIAGNOSIS — Z5112 Encounter for antineoplastic immunotherapy: Secondary | ICD-10-CM | POA: Insufficient documentation

## 2022-08-02 DIAGNOSIS — Z79899 Other long term (current) drug therapy: Secondary | ICD-10-CM | POA: Diagnosis not present

## 2022-08-02 LAB — CMP (CANCER CENTER ONLY)
ALT: 16 U/L (ref 0–44)
AST: 15 U/L (ref 15–41)
Albumin: 4.5 g/dL (ref 3.5–5.0)
Alkaline Phosphatase: 61 U/L (ref 38–126)
Anion gap: 11 (ref 5–15)
BUN: 20 mg/dL (ref 6–20)
CO2: 22 mmol/L (ref 22–32)
Calcium: 9.6 mg/dL (ref 8.9–10.3)
Chloride: 105 mmol/L (ref 98–111)
Creatinine: 1.36 mg/dL — ABNORMAL HIGH (ref 0.61–1.24)
GFR, Estimated: >60 mL/min (ref >60)
Glucose, Bld: 140 mg/dL — ABNORMAL HIGH (ref 70–99)
Potassium: 4.6 mmol/L (ref 3.5–5.1)
Sodium: 138 mmol/L (ref 135–145)
Total Bilirubin: 0.5 mg/dL (ref 0.3–1.2)
Total Protein: 7.4 g/dL (ref 6.5–8.1)

## 2022-08-02 LAB — TSH: TSH: 3.627 u[IU]/mL (ref 0.350–4.500)

## 2022-08-02 LAB — CBC WITH DIFFERENTIAL (CANCER CENTER ONLY)
Abs Immature Granulocytes: 0.01 10*3/uL (ref 0.00–0.07)
Basophils Absolute: 0.1 10*3/uL (ref 0.0–0.1)
Basophils Relative: 2 %
Eosinophils Absolute: 0.3 10*3/uL (ref 0.0–0.5)
Eosinophils Relative: 5 %
HCT: 45.6 % (ref 39.0–52.0)
Hemoglobin: 15.7 g/dL (ref 13.0–17.0)
Immature Granulocytes: 0 %
Lymphocytes Relative: 30 %
Lymphs Abs: 1.7 10*3/uL (ref 0.7–4.0)
MCH: 31.2 pg (ref 26.0–34.0)
MCHC: 34.4 g/dL (ref 30.0–36.0)
MCV: 90.7 fL (ref 80.0–100.0)
Monocytes Absolute: 0.5 10*3/uL (ref 0.1–1.0)
Monocytes Relative: 8 %
Neutro Abs: 3.2 10*3/uL (ref 1.7–7.7)
Neutrophils Relative %: 55 %
Platelet Count: 233 10*3/uL (ref 150–400)
RBC: 5.03 MIL/uL (ref 4.22–5.81)
RDW: 12.8 % (ref 11.5–15.5)
WBC Count: 5.7 10*3/uL (ref 4.0–10.5)
nRBC: 0 % (ref 0.0–0.2)

## 2022-08-02 MED ORDER — SODIUM CHLORIDE 0.9 % IV SOLN
200.0000 mg | Freq: Once | INTRAVENOUS | Status: AC
  Administered 2022-08-02: 200 mg via INTRAVENOUS
  Filled 2022-08-02: qty 200

## 2022-08-02 MED ORDER — SODIUM CHLORIDE 0.9 % IV SOLN: Freq: Once | INTRAVENOUS | Status: AC

## 2022-08-02 NOTE — Progress Notes (Signed)
Hematology and Oncology Follow Up Visit  Russell Burns [id number] 17-Mar-1966 56 y.o. 08/02/2022 8:19 AM Russell Burns, MDShadad, Burns Dad, MD   Principle Diagnosis: 84 year old man with kidney cancer diagnosed in May 2023.  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  Prior Therapy: He is status post left laparoscopic radical nephrectomy completed on 11/12/2021 under the care of Dr. Gloriann Loan.  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    Current therapy: Pembrolizumab 200 mg every 3 weeks started on May 10, 2022.  He completed 2 cycles of therapy in September 2023 and currently on hold.  He is ready to proceed with a cycle 3 of therapy.  Interim History: Mr. Russell Burns presents today for his follow-up visit.  Since last visit, he reports feeling well without any major complaints.  His respiratory status has normalized and he is off prednisone.  He denies any shortness of breath difficulty breathing or wheezing.  His performance status quality of life remains unchanged.     Medications: Reviewed without changes. Current Outpatient Medications  Medication Sig Dispense Refill   acetaminophen (TYLENOL) 500 MG tablet Take 1,000 mg by mouth in the morning.     albuterol (VENTOLIN HFA) 108 (90 Base) MCG/ACT inhaler Inhale 2 puffs into the lungs every 4 (four) hours.     amLODipine (NORVASC) 10 MG tablet Take 10 mg by mouth daily.     atorvastatin (LIPITOR) 80 MG tablet Take 80 mg by mouth daily.     budesonide-formoterol (SYMBICORT) 80-4.5 MCG/ACT inhaler Take 2 puffs first thing in am and then another 2 puffs about 12 hours later. 1 each 12   cetirizine (ZYRTEC) 10 MG tablet Take 10 mg by mouth in the morning.     docusate sodium (COLACE) 100 MG capsule Take 1 capsule (100 mg total) by mouth 2 (two) times daily.     EPINEPHrine 0.3 mg/0.3 mL IJ SOAJ  injection Inject 0.3 mg into the muscle as needed for anaphylaxis.     ezetimibe (ZETIA) 10 MG tablet Take 10 mg by mouth daily.     famotidine (PEPCID) 20 MG tablet One after supper (Patient taking differently: Take 20 mg by mouth daily after supper.) 30 tablet 11   fluticasone (FLONASE) 50 MCG/ACT nasal spray Place 2 sprays into both nostrils in the morning and at bedtime.     JARDIANCE 25 MG TABS tablet Take 25 mg by mouth daily.     losartan (COZAAR) 100 MG tablet Take 100 mg by mouth daily.     metFORMIN (GLUCOPHAGE) 1000 MG tablet Take 1,000 mg by mouth 2 (two) times daily with a meal.     oxyCODONE (OXY IR/ROXICODONE) 5 MG immediate release tablet Take 1-2 tablets (5-10 mg total) by mouth every 6 (six) hours as needed for severe pain or moderate pain. (Patient taking differently: Take 5 mg by mouth See admin instructions. Take 5 mg by mouth at bedtime, if taking Promethazine-DM cough syrup) 20 tablet 0   pantoprazole (PROTONIX) 40 MG tablet Take 1 tablet (40 mg total) by mouth daily. Take 30-60 min before first meal of the day (Patient taking differently: Take 40 mg by mouth daily before breakfast. Take 30-60 min before first meal of the day) 30 tablet 2   prochlorperazine (COMPAZINE) 10 MG tablet Take 1 tablet (10 mg total) by mouth every 6 (six) hours  as needed for nausea or vomiting. 30 tablet 0   promethazine-dextromethorphan (PROMETHAZINE-DM) 6.25-15 MG/5ML syrup Take 5-10 mLs by mouth 4 (four) times daily as needed for cough. 240 mL 0   REPATHA 140 MG/ML SOSY Inject 140 mg into the skin every 14 (fourteen) days.     tamsulosin (FLOMAX) 0.4 MG CAPS capsule Take 1 capsule (0.4 mg total) by mouth daily. 14 capsule 0   tirzepatide (MOUNJARO) 10 MG/0.5ML Pen Inject 10 mg into the skin once a week. (Patient taking differently: Inject 10 mg into the skin every Friday.) 6 mL 3   No current facility-administered medications for this visit.     Allergies:  Allergies  Allergen Reactions   Bee  Venom Anaphylaxis   Other Itching, Swelling and Other (See Comments)    Feline dander- Runny nose, itchy/puffy eyes     Physical Exam:  Blood pressure (!) 142/90, pulse (!) 101, temperature 98.2 F (36.8 C), temperature source Oral, resp. rate 16, weight 244 lb 6.4 oz (110.9 kg), SpO2 97 %.  ECOG: 1     General appearance: Comfortable appearing without any discomfort Head: Normocephalic without any trauma Oropharynx: Mucous membranes are moist and pink without any thrush or ulcers. Eyes: Pupils are equal and round reactive to light. Lymph nodes: No cervical, supraclavicular, inguinal or axillary lymphadenopathy.   Heart:regular rate and rhythm.  S1 and S2 without leg edema. Lung: Clear without any rhonchi or wheezes.  No dullness to percussion. Abdomin: Soft, nontender, nondistended with good bowel sounds.  No hepatosplenomegaly. Musculoskeletal: No joint deformity or effusion.  Full range of motion noted. Neurological: No deficits noted on motor, sensory and deep tendon reflex exam. Skin: No petechial rash or dryness.  Appeared moist.       Lab Results: Lab Results  Component Value Date   WBC 6.1 07/12/2022   HGB 15.1 07/12/2022   HCT 44.3 07/12/2022   MCV 91.0 07/12/2022   PLT 224 07/12/2022     Chemistry      Component Value Date/Time   NA 139 07/25/2022 0935   K 5.4 (H) 07/25/2022 0935   CL 101 07/25/2022 0935   CO2 29 07/25/2022 0935   BUN 23 07/25/2022 0935   CREATININE 1.27 07/25/2022 0935   CREATININE 1.40 (H) 07/12/2022 1253      Component Value Date/Time   CALCIUM 10.0 07/25/2022 0935   ALKPHOS 57 07/12/2022 1253   AST 15 07/12/2022 1253   ALT 23 07/12/2022 1253   BILITOT 0.4 07/12/2022 1253         Impression and Plan:  56 year old with:  1.  T3a renal cell carcinoma diagnosed in May 2023.   He has received adjuvant Pembrolizumab although developed pulmonary complications and treatment is on hold.  Risks and benefits of continuing this  treatment were discussed at this time.  Autoimmune pneumonitis could be a consideration but does not appear to be a contributing factor to his respiratory complaints.  After discussion today, he is agreeable to resume treatment at this time.  His respiratory failure and distress is unrelated to Pembrolizumab at this time.     2.  Antiemetics: Compazine is available to him without any nausea or vomiting.  3.  IV access: Peripheral veins are continue to be in use without any issues.  4.  Immune mediated issues: He has not experienced any autoimmune issues at this time.  Other issues including hepatitis, hypophysitis, colitis been reiterated.  5.  Respiratory failure: Continues follow-up with pulmonary medicine for  the etiology likely related to COPD.  His respiratory status has normalized.   6.  Follow-up: In 3 weeks for the next cycle of therapy.   30  minutes were spent on this visit.  The time was dedicated to reviewing laboratory data, disease status update and treatment choices for the future.  Zola Button, MD 11/9/20238:19 AM

## 2022-08-02 NOTE — Patient Instructions (Signed)
[facility]   Discharge Instructions: Thank you for choosing [facility] to provide your oncology and hematology care.   If you have a lab appointment with the Cancer Center, please go directly to the Cancer Center and check in at the registration area.   Wear comfortable clothing and clothing appropriate for easy access to any Portacath or PICC line.   We strive to give you quality time with your provider. You may need to reschedule your appointment if you arrive late (15 or more minutes).  Arriving late affects you and other patients whose appointments are after yours.  Also, if you miss three or more appointments without notifying the office, you may be dismissed from the clinic at the provider's discretion.      For prescription refill requests, have your pharmacy contact our office and allow 72 hours for refills to be completed.    Today you received the following chemotherapy and/or immunotherapy agents: pembrolizumab      To help prevent nausea and vomiting after your treatment, we encourage you to take your nausea medication as directed.  BELOW ARE SYMPTOMS THAT SHOULD BE REPORTED IMMEDIATELY: *FEVER GREATER THAN 100.4 F (38 C) OR HIGHER *CHILLS OR SWEATING *NAUSEA AND VOMITING THAT IS NOT CONTROLLED WITH YOUR NAUSEA MEDICATION *UNUSUAL SHORTNESS OF BREATH *UNUSUAL BRUISING OR BLEEDING *URINARY PROBLEMS (pain or burning when urinating, or frequent urination) *BOWEL PROBLEMS (unusual diarrhea, constipation, pain near the anus) TENDERNESS IN MOUTH AND THROAT WITH OR WITHOUT PRESENCE OF ULCERS (sore throat, sores in mouth, or a toothache) UNUSUAL RASH, SWELLING OR PAIN  UNUSUAL VAGINAL DISCHARGE OR ITCHING   Items with * indicate a potential emergency and should be followed up as soon as possible or go to the Emergency Department if any problems should occur.  Please show the CHEMOTHERAPY ALERT CARD or IMMUNOTHERAPY ALERT CARD at  check-in to the Emergency Department and triage nurse.  Should you have questions after your visit or need to cancel or reschedule your appointment, please contact  [facility]  Dept: [phone]  and follow the prompts.  Office hours are 8:00 a.m. to 4:30 p.m. Monday - Friday. Please note that voicemails left after 4:00 p.m. may not be returned until the following business day.  We are closed weekends and major holidays. You have access to a nurse at all times for urgent questions. Please call the main number to the clinic Dept: [phone] and follow the prompts.   For any non-urgent questions, you may also contact your provider using MyChart. We now offer e-Visits for anyone 18 and older to request care online for non-urgent symptoms. For details visit [email or web address].   Also download the MyChart app! Go to the app store, search "MyChart", open the app, select , and log in with your MyChart username and password.  Masks are optional in the cancer centers. If you would like for your care team to wear a mask while they are taking care of you, please let them know. You may have one support Naser Schuld who is at least 55 years old accompany you for your appointments. 

## 2022-08-03 ENCOUNTER — Other Ambulatory Visit: Payer: Self-pay | Admitting: Oncology

## 2022-08-06 ENCOUNTER — Encounter: Payer: Self-pay | Admitting: Oncology

## 2022-08-10 ENCOUNTER — Ambulatory Visit: Payer: BC Managed Care – PPO | Attending: Cardiology | Admitting: Cardiology

## 2022-08-10 ENCOUNTER — Encounter: Payer: Self-pay | Admitting: Cardiology

## 2022-08-10 VITALS — BP 130/80 | HR 90 | Ht 70.0 in | Wt 245.4 lb

## 2022-08-10 DIAGNOSIS — I712 Thoracic aortic aneurysm, without rupture, unspecified: Secondary | ICD-10-CM | POA: Diagnosis not present

## 2022-08-10 DIAGNOSIS — I2510 Atherosclerotic heart disease of native coronary artery without angina pectoris: Secondary | ICD-10-CM

## 2022-08-10 DIAGNOSIS — C642 Malignant neoplasm of left kidney, except renal pelvis: Secondary | ICD-10-CM | POA: Diagnosis not present

## 2022-08-10 IMAGING — MR MR ABDOMEN WO/W CM
11 of 17 series · 28 of 48 positions shown · IV contrast (20 ml multihance)
Comparison: No prior abdominal MRI. CT the abdomen and pelvis
12/26/2021.

CLINICAL DATA: 56-year-old male with history of left renal neoplasm
noted on prior CT examination. Follow-up study.

EXAM:
MRI ABDOMEN WITHOUT AND WITH CONTRAST
TECHNIQUE: Multiplanar multisequence MR imaging of the abdomen was performed
both before and after the administration of intravenous contrast.
CONTRAST:  20mL MULTIHANCE GADOBENATE DIMEGLUMINE 529 MG/ML IV SOLN

[Series 3: T2 · coronal · 5.0mm · 1.45mm/px · 2 of 33 slices shown (1 of 3)]
[im 1/33]
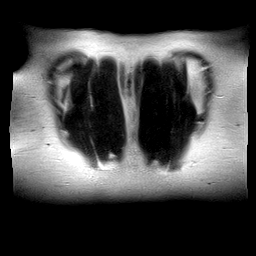
[im 33/33]
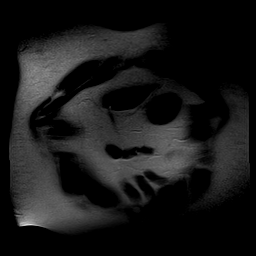

[Series 4: T2 · axial · 5.0mm · 1.41mm/px · z∈[-124,+126]mm · 2 of 41 slices shown (2 of 3)]
[im 1/41]
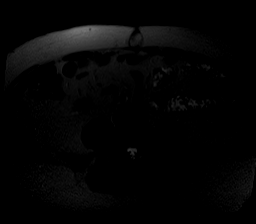
[im 41/41]
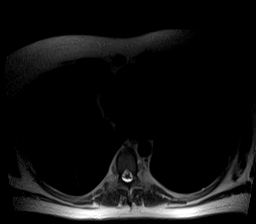

[Series 5: axial in out · axial · 5.5mm · 0.70mm/px · z∈[-135,+137]mm · 4 of 88 slices shown]
[im 1/88]
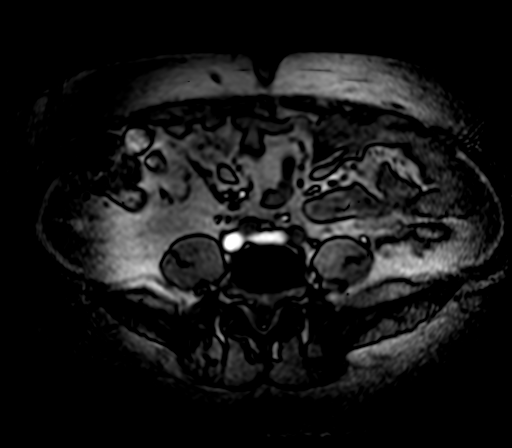
[im 30/88]
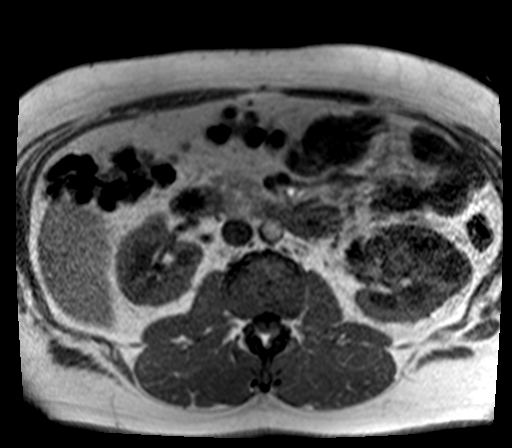
[im 59/88]
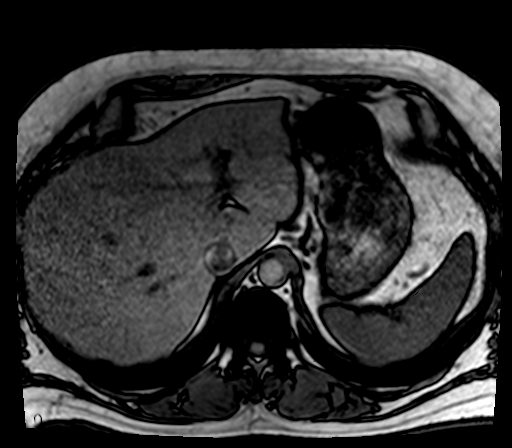
[im 88/88]
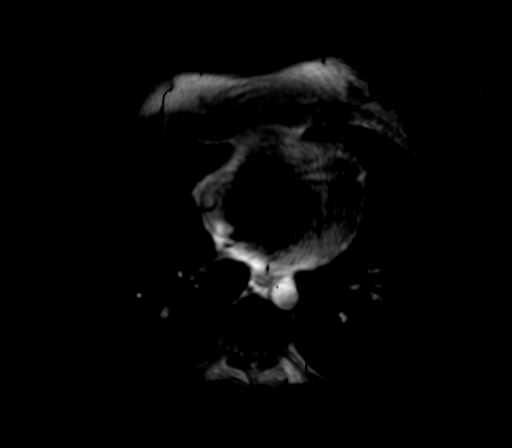

[Series 6: axial tru fisp · axial · 5.0mm · 1.41mm/px · z∈[-128,+131]mm · 2 of 46 slices shown]
[im 1/46]
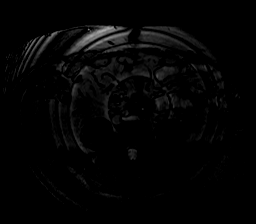
[im 46/46]
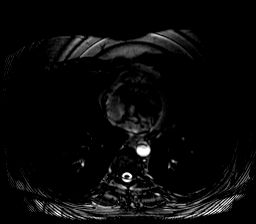

[Series 7: ep2d_[id number]_800_p2_trig · axial · 5.0mm · 1.88mm/px · z∈[-99,+170]mm · 5 of 132 slices shown]
[im 1/132]
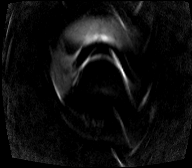
[im 33/132]
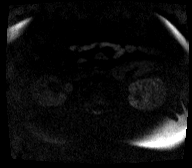
[im 66/132]
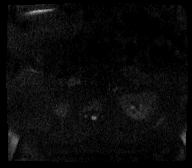
[im 99/132]
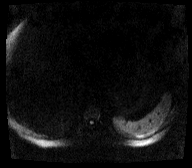
[im 132/132]
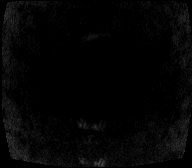

[Series 8: [id number]_p2_trig_adc · axial · 5.0mm · 1.88mm/px · z∈[-93,+170]mm · 2 of 43 slices shown]
[im 1/43]
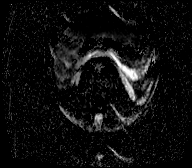
[im 43/43]
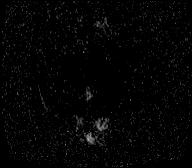

[Series 9: T2 · axial · 5.0mm · 0.74mm/px · 1 of 44 slices shown (3 of 3)]
[im 1/44]
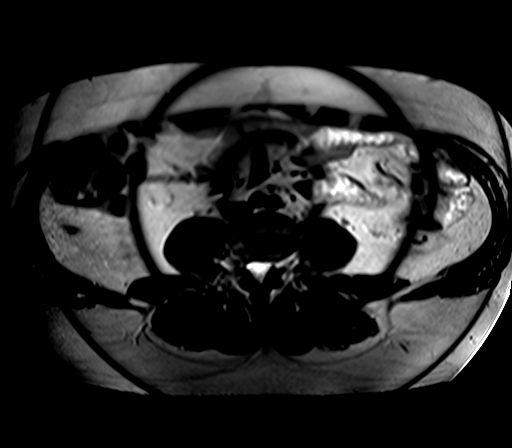

[Series 10: T1 dynamic · axial · non-contrast · 2.3mm · 0.78mm/px · z∈[-133,+122]mm · 3 of 112 slices shown]
[im 1/112]
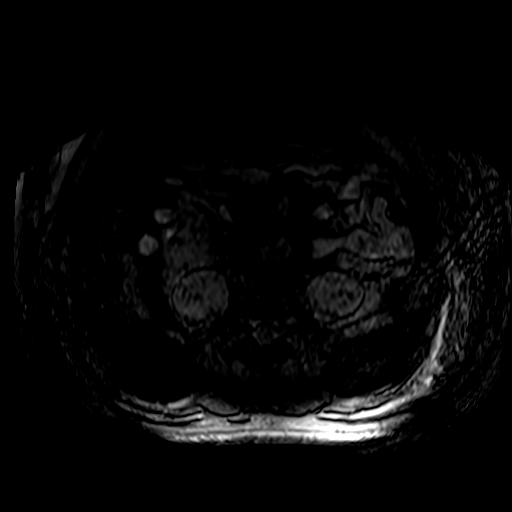
[im 56/112]
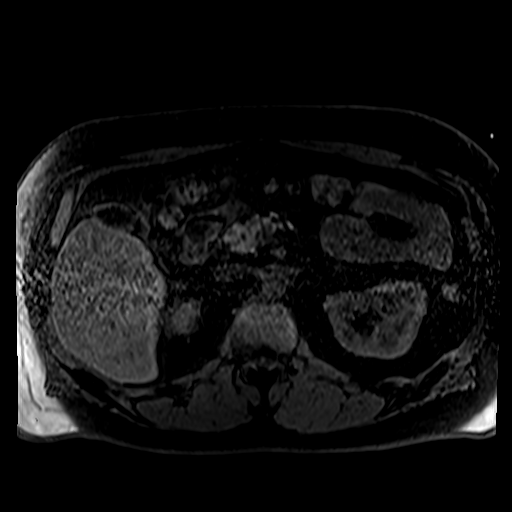
[im 112/112]
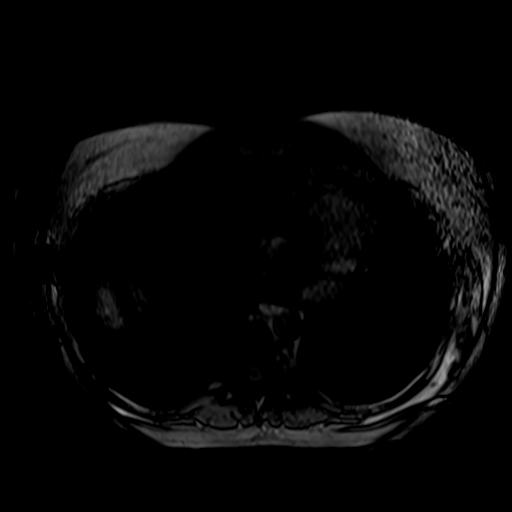

[Series 11: post 30 sec · axial · 2.3mm · 0.78mm/px · z∈[-133,+122]mm · 3 of 112 slices shown]
[im 1/112]
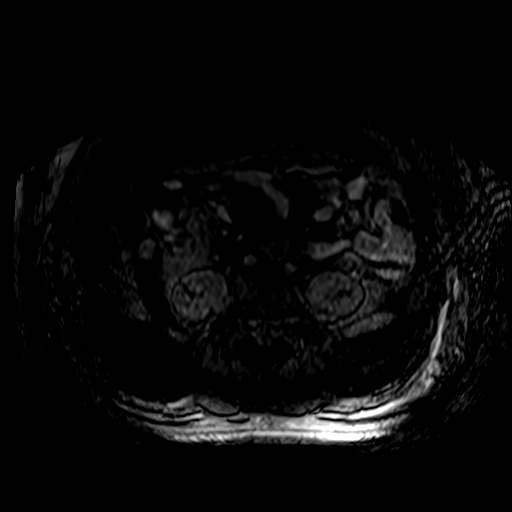
[im 56/112]
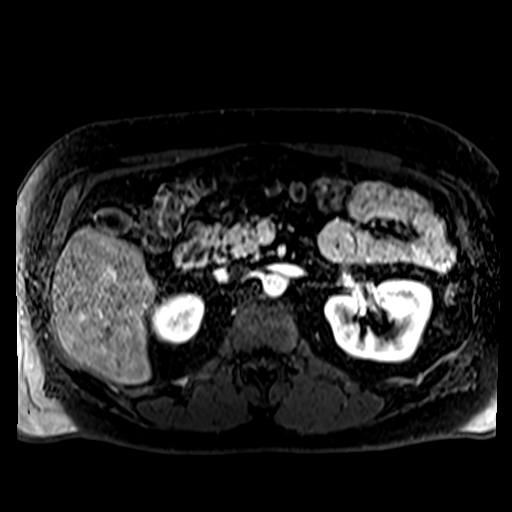
[im 112/112]
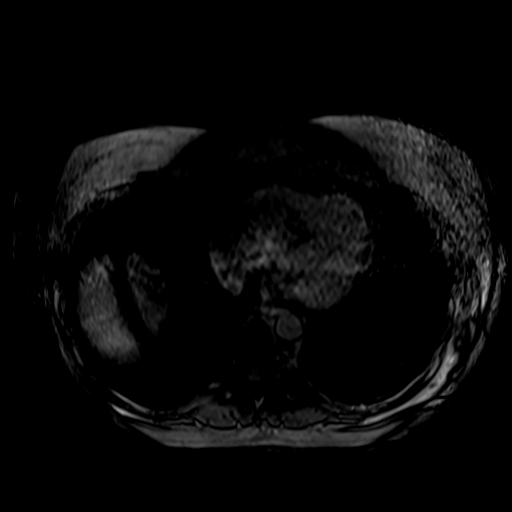

[Series 12: post 30 sec_sub · axial · 2.3mm · 0.78mm/px · z∈[-133,+122]mm · 3 of 112 slices shown]
[im 1/112]
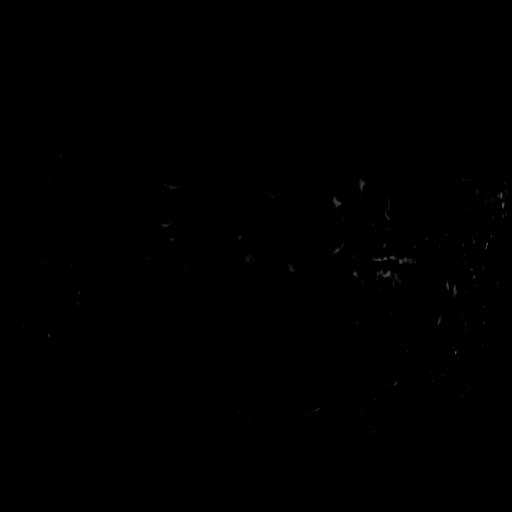
[im 56/112]
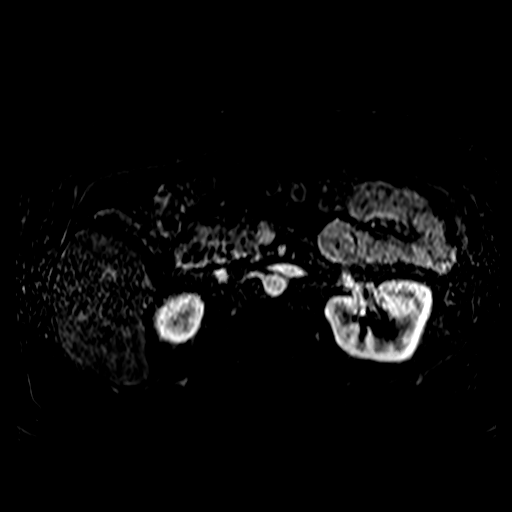
[im 112/112]
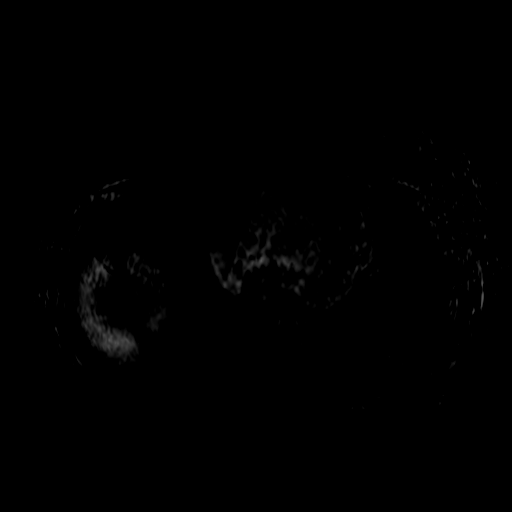

[Series 13: post 60 sec · axial · 2.3mm · 0.78mm/px · 1 of 112 slices shown]
[im 1/112]
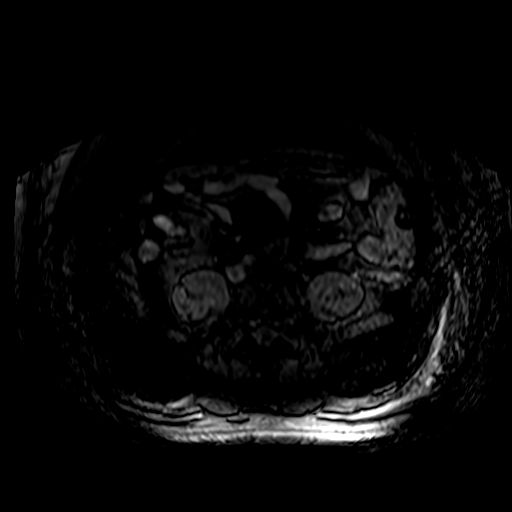

[28 of 48 positions shown; findings below may reference images not displayed]

FINDINGS: Lower chest: Unremarkable.

Hepatobiliary: Heterogeneous loss of signal intensity throughout the
hepatic parenchyma on out of phase dual echo images, indicative of a
background of hepatic steatosis. No suspicious cystic or solid
hepatic lesions. No intra or extrahepatic biliary ductal dilatation.
Gallbladder is normal in appearance.

Pancreas: No pancreatic mass. No pancreatic ductal dilatation. No
pancreatic or peripancreatic fluid collections or inflammatory
changes.

Spleen:  Unremarkable.

Adrenals/Urinary Tract: Centered in the lower pole of the left
kidney (axial image 82 of series 15 and coronal image 52 of series
17) there is a large mass which is very infiltrative in the left
kidney, but with the central nidus of the mass measuring
approximately 4.7 x 4.3 x 4.5 cm. Extending cephalad from the
central nidus toward the left renal hilum there are additional
portions of the lesion which are more infiltrative involving the
left renal collecting system, as low as the proximal left renal vein
(this left renal vein involvement does not extend to the midline).
This mass is encapsulated within Gerota's fascia. In the upper pole
of the left kidney (axial image 45 of series 10) there is an
exophytic lesion measuring 2.0 x 1.6 cm which is predominantly T1
hyperintense and T2 hyperintense with some dependent T2
hypointensity, compatible with a cyst with internal
proteinaceous/hemorrhagic debris. Right kidney and bilateral adrenal
glands are normal in appearance.

Stomach/Bowel: Visualized portions are unremarkable.

Vascular/Lymphatic: No aneurysm identified in the visualized
abdominal vasculature. Previously described left renal mass appears
to extend into the very proximal aspect of the left renal vein, most
notably in the left renal hilum (axial image 61 of series 18), but
does not appear to extend along the length of the left renal vein,
currently approximately 5.7 cm from the midline.

Other: No significant volume of ascites noted in the visualized
portions of the peritoneal cavity.

Musculoskeletal: No aggressive appearing osseous lesions are noted
in the visualized portions of the skeleton.
IMPRESSION: 1. Large infiltrative mass in the lower pole of the left kidney with
extension into the left renal hilum involving both the left renal
collecting system and proximal aspect of the left renal vein.
Overall, the mass is encapsulated within Gerota's fascia and is not
associated with lymphadenopathy or definite signs of metastatic
disease in the abdomen. Left renal vein involvement is proximal
(approximately 5.7 cm from the midline at this time). Urologic
consultation for surgical resection is recommended.
2. Bosniak class 2 cyst in the upper pole of the left kidney
incidentally noted.
3. Heterogeneous hepatic steatosis.

## 2022-08-10 NOTE — Progress Notes (Signed)
Cardiology Office Note:    Date:  08/10/2022   ID:  Russell Burns, DOB [birth year], MRN [id number]  PCP:  [name], MD  [facility] Cardiologist:  Candee Furbish, MD  [facility] Electrophysiologist:  None   Referring MD: [name], MD     History of Present Illness:    Russell Burns is a 56 y.o. male here for follow-up of dilated ascending aorta, 45 mm, coronary artery disease with elevated coronary artery calcium score, status post nephrectomy, wife, Russell Burns diagnosed with glioblastoma in [place] on vacation.   He was last seen on 02/07/2022 for a follow-up and discussion of echo results. He was also seen for surgical clearance for left laparoscopic nephrectomy to be performed by Dr. Gloriann Loan on 02/21/2022 for a mass found on his left kidney.   On 4/10, he developed a pulmonary infection and was in the hospital for 4 days.  Since his visit prior to the last one, he has seen a geneticist.   He denies any palpitations, chest pain, shortness of breath, or peripheral edema. No lightheadedness, headaches, syncope, orthopnea, or PND.  A pharmacologic stress test in the setting of coronary calcium score of 405 was performed at prior visit which was low risk without  any significant evidence of ischemia. His echocardiogram resulted with normal pump function, it couldn't be excluded the possibility of bicuspid aortic valve.  Today, he has been doing well.   He has been continuing medication regimens and has been doing well well on them.  He noted that he lost 30lbs from taking Mounjaro for his T2 diabetes.  He discussed that his left laparoscopic nephrectomy was successful.  A couple of weeks ago, he underwent a CT scan on his spine which concluded spinal stenosis.  He noted an episode where he underwent respiratory distress and had to be hospitalized, which he attributed to the toxins he uses at his work place.  He denies any palpitations, chest pain, shortness of breath,  or peripheral edema. No lightheadedness, headaches, syncope, orthopnea, or PND.  Past Medical History:  Diagnosis Date   Allergy    Aortic stenosis    Arthritis    Blood transfusion without reported diagnosis    Cancer ([place])    Chronic kidney disease    Diabetes mellitus without complication (HCC)    FH: thoracic aneurysm    Hyperlipidemia    Hypertension    Obesity     Past Surgical History:  Procedure Laterality Date   CYSTOSCOPY WITH URETEROSCOPY AND STENT PLACEMENT Left 02/12/2022   Procedure: CYSTOSCOPY WITH LEFT RETROGRADE PYELOGRAM, DIAGNOSTIC URETEROSCOPY AND STENT PLACEMENT;  Surgeon: Lucas Mallow, MD;  Location: [facility];  Service: Urology;  Laterality: Left;   LAPAROSCOPIC NEPHRECTOMY, HAND ASSISTED Left 03/12/2022   Procedure: LEFT HAND ASSISTED LAPAROSCOPIC NEPHRECTOMY;  Surgeon: Lucas Mallow, MD;  Location: [facility];  Service: Urology;  Laterality: Left;  NEED 2.5 HRS FOR THIS CASE   TONSILLECTOMY     WISDOM TOOTH EXTRACTION      Current Medications: Current Meds  Medication Sig   acetaminophen (TYLENOL) 500 MG tablet Take 1,000 mg by mouth in the morning.   albuterol (VENTOLIN HFA) 108 (90 Base) MCG/ACT inhaler Inhale 2 puffs into the lungs every 4 (four) hours.   amLODipine (NORVASC) 10 MG tablet Take 10 mg by mouth daily.   atorvastatin (LIPITOR) 80 MG tablet Take 80 mg by mouth daily.   budesonide-formoterol (SYMBICORT) 80-4.5 MCG/ACT inhaler Take 2 puffs first  thing in am and then another 2 puffs about 12 hours later.   cetirizine (ZYRTEC) 10 MG tablet Take 10 mg by mouth in the morning.   EPINEPHrine 0.3 mg/0.3 mL IJ SOAJ injection Inject 0.3 mg into the muscle as needed for anaphylaxis.   ezetimibe (ZETIA) 10 MG tablet Take 10 mg by mouth daily.   famotidine (PEPCID) 20 MG tablet One after supper (Patient taking differently: Take 20 mg by mouth daily after supper.)   fluticasone (FLONASE) 50 MCG/ACT nasal spray Place 2 sprays into both nostrils in the  morning and at bedtime.   JARDIANCE 25 MG TABS tablet Take 25 mg by mouth daily.   losartan (COZAAR) 100 MG tablet Take 100 mg by mouth daily.   metFORMIN (GLUCOPHAGE) 1000 MG tablet Take 1,000 mg by mouth 2 (two) times daily with a meal.   oxyCODONE (OXY IR/ROXICODONE) 5 MG immediate release tablet Take 1-2 tablets (5-10 mg total) by mouth every 6 (six) hours as needed for severe pain or moderate pain. (Patient taking differently: Take 5 mg by mouth See admin instructions. Take 5 mg by mouth at bedtime, if taking Promethazine-DM cough syrup)   pantoprazole (PROTONIX) 40 MG tablet Take 1 tablet (40 mg total) by mouth daily. Take 30-60 min before first meal of the day (Patient taking differently: Take 40 mg by mouth daily before breakfast. Take 30-60 min before first meal of the day)   prochlorperazine (COMPAZINE) 10 MG tablet TAKE 1 TABLET BY MOUTH EVERY 6 HOURS AS NEEDED FOR NAUSEA OR VOMITING.   promethazine-dextromethorphan (PROMETHAZINE-DM) 6.25-15 MG/5ML syrup Take 5-10 mLs by mouth 4 (four) times daily as needed for cough.   REPATHA 140 MG/ML SOSY Inject 140 mg into the skin every 14 (fourteen) days.   tamsulosin (FLOMAX) 0.4 MG CAPS capsule Take 1 capsule (0.4 mg total) by mouth daily.   tirzepatide (MOUNJARO) 10 MG/0.5ML Pen Inject 10 mg into the skin once a week. (Patient taking differently: Inject 10 mg into the skin every Friday.)     Allergies:   Bee venom and Other   Social History   Socioeconomic History   Marital status: Married    Spouse name: Not on file   Number of children: 2   Years of education: Not on file   Highest education level: Not on file  Occupational History   Not on file  Tobacco Use   Smoking status: Never   Smokeless tobacco: Current    Types: Chew  Vaping Use   Vaping Use: Never used  Substance and Sexual Activity   Alcohol use: Yes    Comment: 3 drinks a week.    Drug use: No   Sexual activity: Yes  Other Topics Concern   Not on file  Social  History Narrative   Not on file   Social Determinants of Health   Financial Resource Strain: Not on file  Food Insecurity: No Food Insecurity (06/14/2022)   Hunger Vital Sign    Worried About Running Out of Food in the Last Year: Never true    Ran Out of Food in the Last Year: Never true  Transportation Needs: No Transportation Needs (06/14/2022)   PRAPARE - Hydrologist (Medical): No    Lack of Transportation (Non-Medical): No  Physical Activity: Not on file  Stress: Not on file  Social Connections: Not on file     Family History: The patient's family history includes Aneurysm in his maternal grandfather and maternal uncle;  Aneurysm (age of onset: 74) in his cousin; CAD in an other family member; Diabetes Mellitus II in his father and another family member; Heart attack in his father; Heart attack (age of onset: 33) in his paternal grandfather; Heart attack (age of onset: 7) in his paternal uncle; Heart attack (age of onset: 61) in his maternal grandmother; Heart failure in an other family member; Hypertension in his mother, paternal uncle, and another family member. There is no history of Colon polyps, Esophageal cancer, Rectal cancer, or Stomach cancer.  ROS:   Please see the history of present illness.     All other systems reviewed and are negative.  EKGs/Labs/Other Studies Reviewed:    The following studies were reviewed today:  CT Chest 07/05/2022: IMPRESSION: There is no evidence of pulmonary artery embolism. There is no evidence of thoracic aortic dissection. There is 4.5 cm aneurism in ascending thoracic aorta with no significant change.   There is no focal pulmonary consolidation. There is no pleural effusion or pneumothorax.   There is 4.6 cm low-density structure inseparable from the upper aspect of left psoas muscle. This finding is not fully evaluated. Differential diagnostic possibilities would include neoplastic process in left  adrenal or postoperative changes in left renal fossa following left nephrectomy. Follow-up CT abdomen and pelvis should be considered for further characterization.   Hyperdense structures are noted in the posterior aspects of bodies of multiple thoracic vertebrae. This finding may be due to abnormal vascular enhancement or sclerotic metastatic disease. There are tortuous collateral vessels in lower neck, superior mediastinum and paraspinal regions. Possibility of narrowing or occlusion of venous structures not included in the area examination is not excluded. As far as seen, superior vena cava appears patent.  MRI Abdomen 02/01/2022: IMPRESSION: 1. Large infiltrative mass in the lower pole of the left kidney with extension into the left renal hilum involving both the left renal collecting system and proximal aspect of the left renal vein. Overall, the mass is encapsulated within Gerota's fascia and is not associated with lymphadenopathy or definite signs of metastatic disease in the abdomen. Left renal vein involvement is proximal (approximately 5.7 cm from the midline at this time). Urologic consultation for surgical resection is recommended. 2. Bosniak class 2 cyst in the upper pole of the left kidney incidentally noted. 3. Heterogeneous hepatic steatosis.  CTA Chest 01/01/2022: FINDINGS: Cardiovascular: There are no filling defects within the pulmonary arteries to suggest pulmonary embolus. Stable 4.6 cm ascending aortic aneurysm without dissection or acute aortic findings. This causes mild mass effect on the superior vena cava. Conventional branching pattern from the aortic arch. Aortic valve calcifications again seen. There is similar contrast refluxing into the azygous and hemi azygous system as well as thoracic venous plexus. The heart is normal in size. No pericardial effusion. There are coronary artery calcifications.   Mediastinum/Nodes: Scattered small mediastinal lymph  nodes, not enlarged by size criteria. No hilar or axillary adenopathy. Patulous esophagus without wall thickening.   Lungs/Pleura: Bronchial thickening with areas of mucous plugging in the lower lobes. Heterogeneous pulmonary parenchyma. 2-3 mm right perifissural lung nodule, series 7, image 61, is stable from 08/29/2020 chest CT and considered benign, consistent with intrapulmonary lymph node. No confluent consolidation. Minor subsegmental atelectasis in the dependent lower lobes. No pleural fluid. No features of pulmonary edema   Upper Abdomen: No acute or unexpected findings.   Musculoskeletal: There are no acute or suspicious osseous abnormalities.   Review of the MIP images confirms the above findings.  IMPRESSION: 1. No pulmonary embolus. 2. Bronchial thickening with areas of mucous plugging in the lower lobes. Heterogeneous pulmonary parenchyma, suggesting small airways disease. 3. Stable 4.6 cm ascending aortic aneurysm. No dissection or acute aortic findings. Ascending thoracic aortic aneurysm. Recommend semi-annual imaging followup by CTA or MRA and referral to cardiothoracic surgery if not already obtained. This recommendation follows 2010 ACCF/AHA/AATS/ACR/ASA/SCA/SCAI/SIR/STS/SVM Guidelines for the Diagnosis and Management of Patients With Thoracic Aortic Disease. Circulation. 2010; 121: E952-W413. Aortic aneurysm NOS (ICD10-I71.9) 4. Coronary artery calcifications.   Aortic Atherosclerosis (ICD10-I70.0).  Cardiac/Coronary CTA  10/26/2021: FINDINGS: Image quality: Average.   Noise artifact is: Limited.   Coronary Arteries:  Normal coronary origin.  Right dominance.   Left main: The left main is a large caliber vessel with a normal take off from the left coronary cusp that bifurcates to form a left anterior descending artery and a left circumflex artery. There is no plaque or stenosis.   Left anterior descending artery: The LAD has minimal (0-24) calcified  plaque in the proximal vessel. The LAD gives off 2 small patent diagonal branches.   Left circumflex artery: The LCX is non-dominant with minimal (0-24) calcified plaque in the proximal, mid and distal vessel. The LCX gives off 1 large, branching OM with mild (25-49) calcified stenosis in the proximal vessel and mimimal (0-24)calcified plaque in the OM branches. The Lcx terminates in small posterolateral branches.   Right coronary artery: Technically suboptimal. The RCA is dominant with normal take off from the right coronary cusp. There is no evidence of plaque or stenosis. The RCA terminates as a PDA and right posterolateral branch without evidence of plaque or stenosis.   Right Atrium: Right atrial size is within normal limits.   Right Ventricle: The right ventricular cavity is within normal limits.   Left Atrium: Left atrial size is normal in size with no left atrial appendage filling defect.   Left Ventricle: The ventricular cavity size is within normal limits. There are no stigmata of prior infarction. There is no abnormal filling defect.   Pulmonary arteries: Normal in size.   Pulmonary veins: Normal pulmonary venous drainage.   Pericardium: Normal thickness with no significant effusion or calcium present.   Cardiac valves: The aortic valve is bicuspid with Ca score 462. The mitral valve is normal structure without significant calcification.   Aorta: Dilated aortic root (44 mm); aortic atherosclerosis.   Extra-cardiac findings: See attached radiology report for non-cardiac structures.   IMPRESSION: 1. Coronary calcium score of 530. This was 71 percentile for age-, sex, and race-matched controls.   2. Normal coronary origin with right dominance.   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   4. Aortic atherosclerosis.   5. Dilated ascending aorta (44 mm).   6. Bicuspid aortic valve   4. Aortic valve Ca score 462.  Echo 09/29/2021:  1. Left ventricular  ejection fraction, by estimation, is 55 to 60%. The  left ventricle has normal function. The left ventricle has no regional  wall motion abnormalities. There is mild asymmetric left ventricular  hypertrophy of the basal-septal segment.  Left ventricular diastolic parameters are consistent with Grade I  diastolic dysfunction (impaired relaxation).   2. Right ventricular systolic function is normal. The right ventricular  size is normal.   3. Left atrial size was mild to moderately dilated.   4. The mitral valve is normal in structure. Trivial mitral valve  regurgitation.   5. The aortic valve was not well visualized. There is moderate  calcification of the aortic valve. There is moderate thickening of the  aortic valve. Aortic valve regurgitation is trivial. Moderate aortic valve  stenosis. AVA 1.12cm2, mean gradient 35mHg,   peak gradient 4643mg, Vmax 3.43m60m DI 0.33.   6. Aortic dilatation noted. There is borderline dilatation of the  ascending aorta, measuring 36 mm.   Comparison(s): Compared to prior TTE in 09/30/20, the degree of AS has  progressed. Prior mean gradient 46m143m Vmax 2.28m/943m Echo 09/30/2020:  1. Left ventricular ejection fraction, by estimation, is 60 to 65%. The  left ventricle has normal function. The left ventricle has no regional  wall motion abnormalities. Left ventricular diastolic parameters are  consistent with Grade I diastolic  dysfunction (impaired relaxation).   2. Right ventricular systolic function is normal. The right ventricular  size is normal.   3. The mitral valve is normal in structure. Trivial mitral valve  regurgitation. No evidence of mitral stenosis.   4. The aortic vavle is poorly visualizd. It appears to be at least  moderately calcified and restricted. Cannot rule out bicuspid aortic  valve. The aortic valve is calcified. There is moderate calcification of  the aortic valve. There is moderate  thickening of the aortic valve. Aortic  valve regurgitation is not  visualized. Mild to moderate aortic valve stenosis. Aortic valve area, by  VTI measures 2.00 cm. Aortic valve mean gradient measures 19.0 mmHg.  Aortic valve Vmax measures 2.75 m/s.   5. The inferior vena cava is normal in size with <50% respiratory  variability, suggesting right atrial pressure of 8 mmHg.    NUC stress 09/30/20:  The left ventricular ejection fraction is normal (55-65%). Nuclear stress EF: 55%. There was no ST segment deviation noted during stress. No T wave inversion was noted during stress. The study is normal. This is a low risk study.   1.  There are reduced counts in the apex on rest and stress imaging with normal wall motion consistent with apical thinning artifact.  Overall, this is a normal study without evidence of ischemia or prior infarction. 2.  Normal LVEF, 55%. 3.  This is a low risk study.   CT calcium score 08/29/20:  Left Main: 0   LAD: 144   LCx: 262   RCA: 0   Total Agatston Score: 405   MESA database percentile: 96   AORTA MEASUREMENTS:   Ascending Aorta: 45 mm   Descending Aorta: 26 mm    EKG:  EKG is personally reviewed. 06/15/2022: Sinus tachycardia. Rate 99 bpm. Left anterior fascicular block. 02/07/2022:  Sinus rhythm. Rate 69 bpm. Left axis deviation. 08/31/2020 --sinus rhythm 75 borderline conduction delay   Recent Labs: 01/04/2022: Magnesium 2.5 06/11/2022: Pro B Natriuretic peptide (BNP) 43.0 06/14/2022: B Natriuretic Peptide 27.9 08/02/2022: ALT 16; BUN 20; Creatinine 1.36; Hemoglobin 15.7; Platelet Count 233; Potassium 4.6; Sodium 138; TSH 3.627   Recent Lipid Panel No results found for: "CHOL", "TRIG", "HDL", "CHOLHDL", "VLDL", "LDLCALC", "LDLDIRECT"   Risk Assessment/Calculations:      Physical Exam:    VS:  BP 130/80 (BP Location: Right Arm, Patient Position: Sitting, Cuff Size: Normal)   Pulse 90   Ht '5\' 10"'$  (1.778 m)   Wt 245 lb 6 oz (111.3 kg)   SpO2 95%   BMI 35.21 kg/m      Wt Readings from Last 3 Encounters:  08/10/22 245 lb 6 oz (111.3 kg)  08/02/22 244 lb 6.4 oz (110.9 kg)  07/25/22 247 lb (112 kg)  GEN:  Well nourished, well developed in no acute distress HEENT: Normal NECK: No JVD; No carotid bruits LYMPHATICS: No lymphadenopathy CARDIAC: RRR, 2/6 SM RUSB, rubs, gallops RESPIRATORY:  Clear to auscultation without rales, wheezing or rhonchi  ABDOMEN: Soft, non-tender, non-distended MUSCULOSKELETAL:  No edema; No deformity  SKIN: Warm and dry NEUROLOGIC:  Alert and oriented x 3 PSYCHIATRIC:  Normal affect   ASSESSMENT:    1. Thoracic aortic aneurysm without rupture, unspecified part ([place])   2. Coronary artery disease involving native coronary artery of native heart without angina pectoris   3. Malignant neoplasm of left kidney (HCC)      PLAN:    In order of problems listed above:  Thoracic aortic aneurysm - Stable at 45 mm.  Via CT scan.  Monitoring closely.  Dr. Cyndia Bent with cardiothoracic surgery also monitoring.  Avoid heavy Valsalva type activity.  Continue to strictly control blood pressure.  Coronary artery disease with elevated coronary calcium score - Currently on Repatha.  Last LDL in the teens, 10.  Excellent.  Discussed this with him.  Anti-inflammatory etc.  No active anginal symptoms.  Renal cell carcinoma - Left nephrectomy extending into the renal vein.  Followed by oncology.  Prior note reviewed.  Hyperlipidemia - On both Zetia 10 mg as well as Repatha.  As well as 80 mg of atorvastatin.  Doing very well.  No myalgias.  Diabetes with hyperlipidemia - On Jardiance as well as metformin.  Also taking Mounjaro.  Has lost about 30 pounds.  Follow-up: 37-month  Medication Adjustments/Labs and Tests Ordered: Current medicines are reviewed at length with the patient today.  Concerns regarding medicines are outlined above.   No orders of the defined types were placed in this encounter.  No orders of the defined types  were placed in this encounter.  Patient Instructions  Medication Instructions:  Your physician recommends that you continue on your current medications as directed. Please refer to the Current Medication list given to you today.  *If you need a refill on your cardiac medications before your next appointment, please call your pharmacy*  Follow-Up: At [facility] you and your health needs are our priority.  As part of our continuing mission to provide you with exceptional heart care, we have created designated Provider Care Teams.  These Care Teams include your primary Cardiologist (physician) and Advanced Practice Providers (APPs -  Physician Assistants and Nurse Practitioners) who all work together to provide you with the care you need, when you need it.   Your next appointment:   6 month(s)  The format for your next appointment:   In Person  Provider:   MCandee Furbish MD     Important Information About Sugar          IEricka Pontiffas a scribe for MCandee Furbish MD.,have documented all relevant documentation on the behalf of MCandee Furbish MD,as directed by  MCandee Furbish MD while in the presence of MCandee Furbish MD.  I, MCandee Furbish MD, have reviewed all documentation for this visit. The documentation on 08/10/22 for the exam, diagnosis, procedures, and orders are all accurate and complete.  Signed, MCandee Furbish MD  08/10/2022 8:54 AM    [facility]

## 2022-08-10 NOTE — Patient Instructions (Signed)
Medication Instructions:  Your physician recommends that you continue on your current medications as directed. Please refer to the Current Medication list given to you today.  *If you need a refill on your cardiac medications before your next appointment, please call your pharmacy*  Follow-Up: At [facility], you and your health needs are our priority.  As part of our continuing mission to provide you with exceptional heart care, we have created designated Provider Care Teams.  These Care Teams include your primary Cardiologist (physician) and Advanced Practice Providers (APPs -  Physician Assistants and Nurse Practitioners) who all work together to provide you with the care you need, when you need it.   Your next appointment:   6 month(s)  The format for your next appointment:   In Person  Provider:   Candee Furbish, MD     Important Information About Sugar

## 2022-08-11 ENCOUNTER — Other Ambulatory Visit: Payer: Self-pay

## 2022-08-20 DIAGNOSIS — E1165 Type 2 diabetes mellitus with hyperglycemia: Secondary | ICD-10-CM | POA: Diagnosis not present

## 2022-08-20 DIAGNOSIS — Z23 Encounter for immunization: Secondary | ICD-10-CM | POA: Diagnosis not present

## 2022-08-22 DIAGNOSIS — G43909 Migraine, unspecified, not intractable, without status migrainosus: Secondary | ICD-10-CM | POA: Diagnosis not present

## 2022-08-22 DIAGNOSIS — I5032 Chronic diastolic (congestive) heart failure: Secondary | ICD-10-CM | POA: Diagnosis not present

## 2022-08-22 DIAGNOSIS — R0902 Hypoxemia: Secondary | ICD-10-CM | POA: Diagnosis not present

## 2022-08-23 ENCOUNTER — Inpatient Hospital Stay: Payer: BC Managed Care – PPO

## 2022-08-23 ENCOUNTER — Inpatient Hospital Stay (HOSPITAL_BASED_OUTPATIENT_CLINIC_OR_DEPARTMENT_OTHER): Payer: BC Managed Care – PPO | Admitting: Oncology

## 2022-08-23 VITALS — BP 154/95 | HR 90 | Temp 97.5°F | Resp 18 | Wt 246.2 lb

## 2022-08-23 DIAGNOSIS — C642 Malignant neoplasm of left kidney, except renal pelvis: Secondary | ICD-10-CM

## 2022-08-23 DIAGNOSIS — Z79899 Other long term (current) drug therapy: Secondary | ICD-10-CM | POA: Diagnosis not present

## 2022-08-23 DIAGNOSIS — Z5112 Encounter for antineoplastic immunotherapy: Secondary | ICD-10-CM | POA: Diagnosis not present

## 2022-08-23 LAB — CBC WITH DIFFERENTIAL (CANCER CENTER ONLY)
Abs Immature Granulocytes: 0.02 10*3/uL (ref 0.00–0.07)
Basophils Absolute: 0.1 10*3/uL (ref 0.0–0.1)
Basophils Relative: 2 %
Eosinophils Absolute: 0.6 10*3/uL — ABNORMAL HIGH (ref 0.0–0.5)
Eosinophils Relative: 9 %
HCT: 46 % (ref 39.0–52.0)
Hemoglobin: 15.9 g/dL (ref 13.0–17.0)
Immature Granulocytes: 0 %
Lymphocytes Relative: 29 %
Lymphs Abs: 1.9 10*3/uL (ref 0.7–4.0)
MCH: 31.7 pg (ref 26.0–34.0)
MCHC: 34.6 g/dL (ref 30.0–36.0)
MCV: 91.8 fL (ref 80.0–100.0)
Monocytes Absolute: 0.4 10*3/uL (ref 0.1–1.0)
Monocytes Relative: 6 %
Neutro Abs: 3.5 10*3/uL (ref 1.7–7.7)
Neutrophils Relative %: 54 %
Platelet Count: 239 10*3/uL (ref 150–400)
RBC: 5.01 MIL/uL (ref 4.22–5.81)
RDW: 13.6 % (ref 11.5–15.5)
WBC Count: 6.4 10*3/uL (ref 4.0–10.5)
nRBC: 0 % (ref 0.0–0.2)

## 2022-08-23 LAB — CMP (CANCER CENTER ONLY)
ALT: 19 U/L (ref 0–44)
AST: 15 U/L (ref 15–41)
Albumin: 4.7 g/dL (ref 3.5–5.0)
Alkaline Phosphatase: 65 U/L (ref 38–126)
Anion gap: 9 (ref 5–15)
BUN: 21 mg/dL — ABNORMAL HIGH (ref 6–20)
CO2: 25 mmol/L (ref 22–32)
Calcium: 9.6 mg/dL (ref 8.9–10.3)
Chloride: 103 mmol/L (ref 98–111)
Creatinine: 1.26 mg/dL — ABNORMAL HIGH (ref 0.61–1.24)
GFR, Estimated: >60 mL/min (ref >60)
Glucose, Bld: 195 mg/dL — ABNORMAL HIGH (ref 70–99)
Potassium: 4.7 mmol/L (ref 3.5–5.1)
Sodium: 137 mmol/L (ref 135–145)
Total Bilirubin: 0.6 mg/dL (ref 0.3–1.2)
Total Protein: 7.7 g/dL (ref 6.5–8.1)

## 2022-08-23 LAB — TSH: TSH: 3.809 u[IU]/mL (ref 0.350–4.500)

## 2022-08-23 MED ORDER — SODIUM CHLORIDE 0.9 % IV SOLN
200.0000 mg | Freq: Once | INTRAVENOUS | Status: AC
  Administered 2022-08-23: 200 mg via INTRAVENOUS
  Filled 2022-08-23: qty 200

## 2022-08-23 MED ORDER — SODIUM CHLORIDE 0.9 % IV SOLN: Freq: Once | INTRAVENOUS | Status: AC

## 2022-08-23 NOTE — Progress Notes (Signed)
Hematology and Oncology Follow Up Visit  Russell Burns [id number] [birth year] 56 y.o. 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  Prior Therapy: He is status post left laparoscopic radical nephrectomy completed on 11/12/2021 under the care of Dr. Gloriann Loan.  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    Current therapy: Pembrolizumab 200 mg every 3 weeks started on May 10, 2022.  He completed 3 cycles of therapy after a brief interruption.  He is here for cycle 4 of therapy.  Interim History: [name] returns today for repeat evaluation.  Since the last visit, he reports feeling well without any major complaints.  He denies any nausea, vomiting or diarrhea.  His respiratory status remains stable without any cough, wheezing or shortness of breath.  He remains active and attends to activities of daily living without any decline.     Medications: Updated on review. Current Outpatient Medications  Medication Sig Dispense Refill   acetaminophen (TYLENOL) 500 MG tablet Take 1,000 mg by mouth in the morning.     albuterol (VENTOLIN HFA) 108 (90 Base) MCG/ACT inhaler Inhale 2 puffs into the lungs every 4 (four) hours.     amLODipine (NORVASC) 10 MG tablet Take 10 mg by mouth daily.     atorvastatin (LIPITOR) 80 MG tablet Take 80 mg by mouth daily.     budesonide-formoterol (SYMBICORT) 80-4.5 MCG/ACT inhaler Take 2 puffs first thing in am and then another 2 puffs about 12 hours later. 1 each 12   cetirizine (ZYRTEC) 10 MG tablet Take 10 mg by mouth in the morning.     docusate sodium (COLACE) 100 MG capsule Take 1 capsule (100 mg total) by mouth 2 (two) times daily.     EPINEPHrine 0.3 mg/0.3 mL IJ SOAJ injection Inject 0.3 mg into the  muscle as needed for anaphylaxis.     ezetimibe (ZETIA) 10 MG tablet Take 10 mg by mouth daily.     famotidine (PEPCID) 20 MG tablet One after supper (Patient taking differently: Take 20 mg by mouth daily after supper.) 30 tablet 11   fluticasone (FLONASE) 50 MCG/ACT nasal spray Place 2 sprays into both nostrils in the morning and at bedtime.     JARDIANCE 25 MG TABS tablet Take 25 mg by mouth daily.     losartan (COZAAR) 100 MG tablet Take 100 mg by mouth daily.     metFORMIN (GLUCOPHAGE) 1000 MG tablet Take 1,000 mg by mouth 2 (two) times daily with a meal.     oxyCODONE (OXY IR/ROXICODONE) 5 MG immediate release tablet Take 1-2 tablets (5-10 mg total) by mouth every 6 (six) hours as needed for severe pain or moderate pain. (Patient taking differently: Take 5 mg by mouth See admin instructions. Take 5 mg by mouth at bedtime, if taking Promethazine-DM cough syrup) 20 tablet 0   pantoprazole (PROTONIX) 40 MG tablet Take 1 tablet (40 mg total) by mouth daily. Take 30-60 min before first meal of the day (Patient taking differently: Take 40 mg by mouth daily before breakfast. Take 30-60 min before first meal of the day) 30 tablet 2   prochlorperazine (COMPAZINE) 10 MG tablet TAKE 1 TABLET BY MOUTH EVERY 6 HOURS AS NEEDED FOR NAUSEA OR VOMITING. 30 tablet 0  promethazine-dextromethorphan (PROMETHAZINE-DM) 6.25-15 MG/5ML syrup Take 5-10 mLs by mouth 4 (four) times daily as needed for cough. 240 mL 0   REPATHA 140 MG/ML SOSY Inject 140 mg into the skin every 14 (fourteen) days.     tamsulosin (FLOMAX) 0.4 MG CAPS capsule Take 1 capsule (0.4 mg total) by mouth daily. 14 capsule 0   tirzepatide (MOUNJARO) 10 MG/0.5ML Pen Inject 10 mg into the skin once a week. (Patient taking differently: Inject 10 mg into the skin every Friday.) 6 mL 3   No current facility-administered medications for this visit.     Allergies:  Allergies  Allergen Reactions   Bee Venom Anaphylaxis   Other Itching, Swelling and  Other (See Comments)    Feline dander- Runny nose, itchy/puffy eyes     Physical Exam:  Blood pressure (!) 154/95, pulse 90, temperature (!) 97.5 F (36.4 C), temperature source Temporal, resp. rate 18, weight 246 lb 3.2 oz (111.7 kg), SpO2 94 %.  ECOG: 1    General appearance: Alert, awake without any distress. Head: Atraumatic without abnormalities Oropharynx: Without any thrush or ulcers. Eyes: No scleral icterus. Lymph nodes: No lymphadenopathy noted in the cervical, supraclavicular, or axillary nodes Heart:regular rate and rhythm, without any murmurs or gallops.   Lung: Clear to auscultation without any rhonchi, wheezes or dullness to percussion. Abdomin: Soft, nontender without any shifting dullness or ascites. Musculoskeletal: No clubbing or cyanosis. Neurological: No motor or sensory deficits. Skin: No rashes or lesions.       Lab Results: Lab Results  Component Value Date   WBC 6.4 08/23/2022   HGB 15.9 08/23/2022   HCT 46.0 08/23/2022   MCV 91.8 08/23/2022   PLT 239 08/23/2022     Chemistry      Component Value Date/Time   NA 138 08/02/2022 0816   NA 139 07/25/2022 0935   K 4.6 08/02/2022 0816   CL 105 08/02/2022 0816   CO2 22 08/02/2022 0816   BUN 20 08/02/2022 0816   BUN 23 07/25/2022 0935   CREATININE 1.36 (H) 08/02/2022 0816      Component Value Date/Time   CALCIUM 9.6 08/02/2022 0816   ALKPHOS 61 08/02/2022 0816   AST 15 08/02/2022 0816   ALT 16 08/02/2022 0816   BILITOT 0.5 08/02/2022 0816         Impression and Plan:  35 year old with:  1.  Kidney cancer diagnosed in May 2023.  He was found to have T3a without any evidence of metastatic disease.   Risks and benefits of continuing adjuvant Pembrolizumab were discussed at this time.  Complications that include nausea, fatigue and autoimmune considerations.  His respiratory status is stable and I see no contraindication to proceeding with treatment.  The plan is to complete a total  of 17 cycles are close to 1 year of therapy.     2.  Antiemetics: No nausea or vomiting reported at this time.  Compazine is available to him.  3.  IV access: No issues with peripheral veins at this time.  4.  Immune mediated issues: I continue to educate him about potential complication including hepatitis, pneumonitis, thyroid disease, and others.  He has not experienced any at this time.  5.  Respiratory failure: Related to COPD rather than autoimmune considerations.  Continues to follow with pulmonary medicine with overall stable disease.   6.  Follow-up: He will return in 3 weeks for the next cycle of therapy.   30  minutes were dedicated to this encounter.  The time was spent on updating disease status, treatment choices and outlining future plan of care review.  Zola Button, MD 11/30/202311:24 AM

## 2022-08-25 LAB — T4: T4, Total: 6.1 ug/dL (ref 4.5–12.0)

## 2022-08-31 ENCOUNTER — Encounter: Payer: Self-pay | Admitting: Internal Medicine

## 2022-08-31 NOTE — Telephone Encounter (Signed)
Called and spoke with pt letting him know that we should get him in for an appt to have O2 sats reevaluted and he verbalized understanding. Appt scheduled for pt with Katie at 2pm 12/11. Nothing further needed.

## 2022-09-03 ENCOUNTER — Encounter: Payer: Self-pay | Admitting: Nurse Practitioner

## 2022-09-03 ENCOUNTER — Ambulatory Visit (INDEPENDENT_AMBULATORY_CARE_PROVIDER_SITE_OTHER): Payer: BC Managed Care – PPO | Admitting: Nurse Practitioner

## 2022-09-03 ENCOUNTER — Ambulatory Visit (INDEPENDENT_AMBULATORY_CARE_PROVIDER_SITE_OTHER): Payer: BC Managed Care – PPO

## 2022-09-03 VITALS — BP 138/84 | HR 80 | Ht 70.0 in | Wt 244.6 lb

## 2022-09-03 DIAGNOSIS — J4489 Other specified chronic obstructive pulmonary disease: Secondary | ICD-10-CM

## 2022-09-03 DIAGNOSIS — J9601 Acute respiratory failure with hypoxia: Secondary | ICD-10-CM | POA: Diagnosis not present

## 2022-09-03 DIAGNOSIS — R0602 Shortness of breath: Secondary | ICD-10-CM | POA: Diagnosis not present

## 2022-09-03 DIAGNOSIS — J4541 Moderate persistent asthma with (acute) exacerbation: Secondary | ICD-10-CM | POA: Diagnosis not present

## 2022-09-03 MED ORDER — PREDNISONE 10 MG PO TABS: ORAL_TABLET | ORAL | 0 refills | Status: DC

## 2022-09-03 MED ORDER — ALBUTEROL SULFATE (2.5 MG/3ML) 0.083% IN NEBU: 2.5000 mg | INHALATION_SOLUTION | Freq: Four times a day (QID) | RESPIRATORY_TRACT | 12 refills | Status: DC | PRN

## 2022-09-03 MED ORDER — AZITHROMYCIN 250 MG PO TABS: ORAL_TABLET | ORAL | 0 refills | Status: DC

## 2022-09-03 MED ORDER — METHYLPREDNISOLONE ACETATE 80 MG/ML IJ SUSP
120.0000 mg | Freq: Once | INTRAMUSCULAR | Status: AC
  Administered 2022-09-03: 120 mg via INTRAMUSCULAR

## 2022-09-03 MED ORDER — IPRATROPIUM-ALBUTEROL 0.5-2.5 (3) MG/3ML IN SOLN
3.0000 mL | Freq: Four times a day (QID) | RESPIRATORY_TRACT | Status: DC
  Administered 2022-09-03: 3 mL via RESPIRATORY_TRACT

## 2022-09-03 NOTE — Assessment & Plan Note (Addendum)
Acute exacerbation of asthmatic bronchitis with significant bronchospasm on exam. He had clinical improvement with duoneb in office. We will treat him with depo inj 120 mg x 1, prednisone taper, and empiric azithromycin. Urgent orders placed for nebulizer machine. Instructed him to use his rescue inhaler every 4-6 hours until neb machine arrives then he will use this 4 times a day. CXR to rule out superimposed infection. He was able to maintain O2 sats >90% with exertion on room air in office. Strict ED precautions and close follow up. I am concerned with timing of restarting Keytruda and symptom onset. His respiratory status will need to be closely monitored if he continues with infusions. Evaluate CXR for infiltrates today.   Patient Instructions  -Continue Albuterol inhaler 2 puffs or 3 mL neb every 6 hours as needed for shortness of breath or wheezing. Notify if symptoms persist despite rescue inhaler/neb use. Orders placed for nebulizer today. You should use your breathing treatments at least four times a day until symptoms improve -Continue Symbicort 2 puffs Twice daily. Brush tongue and rinse mouth afterwards -Continue Zyrtec 1 tab daily for allergies -Continue flonase nasal spray 2 sprays each nostril daily -Continue protonix 1 tab daily  Prednisone taper. 4 tabs for 2 days, then 3 tabs for 2 days, 2 tabs for 2 days, then 1 tab for 2 days, then stop. Take in AM with food. Start tomorrow Azithromycin 2 tablets on day one then 1 tablet daily for four additional days. Take with food Guaifenesin [phone] Twice daily for congestion  Chest x ray today   Follow up in one week with Dr. Melvyn Novas or Alanson Aly. If symptoms do not improve or worsen, please contact office for sooner follow up or seek emergency care.

## 2022-09-03 NOTE — Assessment & Plan Note (Signed)
Reported O2 levels between mid 80's-low 90's at home. He was able to maintain saturations on room air today with SpO2 low 92%. Advised him to closely monitor for goal >88-90%. If no improvement with rescue or rest, needs to go to the ED. Wells low probability for PE; symptoms/clinical apperance corresponds to asthma exacerbation.

## 2022-09-03 NOTE — Patient Instructions (Signed)
-  Continue Albuterol inhaler 2 puffs or 3 mL neb every 6 hours as needed for shortness of breath or wheezing. Notify if symptoms persist despite rescue inhaler/neb use. Orders placed for nebulizer today. You should use your breathing treatments at least four times a day until symptoms improve -Continue Symbicort 2 puffs Twice daily. Brush tongue and rinse mouth afterwards -Continue Zyrtec 1 tab daily for allergies -Continue flonase nasal spray 2 sprays each nostril daily -Continue protonix 1 tab daily  Prednisone taper. 4 tabs for 2 days, then 3 tabs for 2 days, 2 tabs for 2 days, then 1 tab for 2 days, then stop. Take in AM with food. Start tomorrow Azithromycin 2 tablets on day one then 1 tablet daily for four additional days. Take with food Guaifenesin [phone] mg Twice daily for congestion  Chest x ray today   Follow up in one week with Dr. Melvyn Novas or Alanson Aly. If symptoms do not improve or worsen, please contact office for sooner follow up or seek emergency care.

## 2022-09-03 NOTE — Progress Notes (Signed)
$'@Patient'Z$  ID: Russell Burns, male    DOB: 01-01-1966, 56 y.o.   MRN: [id number]  Chief Complaint  Patient presents with   Follow-up    Pt f/u his O2 sats are dropping, avg 86-92%, pt having increased SOB/dizziness.     Referring provider: [name], MD  HPI: 56 year old male, never smoker followed for asthmatic bronchitis. Previous concern for Keytruda induced pneumonitis with acute respiratory failure, which he was hospitalized for in April 2023 and September 2023. He improved with steroids and supplemental oxygen was discontinued in November 2023. He resumed Keytruda infusions for his renal cell carcinoma in November 2023 as well. He is a patient of Dr. Gustavus Bryant and last seen in office 07/25/2022. Past medical history significant for HTN, thoracic aortic aneurysm, CHF, GERD, DM, HLD.   TEST/EVENTS:  06/11/2022 A1AT: MS level 129 07/25/2022 FeNO 29  07/25/2022: OV with Dr. Melvyn Novas. Breathing much improved. Has not required SABA in past two weeks. Using Symbicort daily. Walking oximetry without desaturations on room air. Onset of asthmatic bronchitis post Keytruda exposure and in setting of long term seasonal rhinitis. Encouraged to continuing monitoring once a week at peak of exercise.  09/03/2022: Today - acute Patient presents today for acute visit with his wife. He contacted the office on 12/8 to notify of decreased oxygen levels. He was advised to come in for an acute visit. He tells me today that he has been having trouble with his breathing for the past week. He feels like he can't get a deep breathing and his chest is just very tight/heavy. He has noticed he's wheezing more. He has some cough, which is mostly dry. Feels like he does have stuff in his chest but can't seem to get any phlegm up. He feels really run down. His oxygen levels have been anywhere from 85% to the low 90's. He's noticed they're lower first thing in the morning. This has only been since he started having trouble with  his breathing; prior to this, oxygen levels were normal. He denies fevers, chills, hemoptysis, calf pain/swelling, orthopnea. No sick exposures. He is using Symbicort Twice daily. Has been using albuterol multiple times a day, which helps his symptoms a lot but doesn't last long. He did restart Keytruda since he was here last. Last infusion was 11/30.   Allergies  Allergen Reactions   Bee Venom Anaphylaxis   Other Itching, Swelling and Other (See Comments)    Feline dander- Runny nose, itchy/puffy eyes    Immunization History  Administered Date(s) Administered   PFIZER(Purple Top)SARS-COV-2 Vaccination 12/18/2019, 01/12/2020    Past Medical History:  Diagnosis Date   Allergy    Aortic stenosis    Arthritis    Blood transfusion without reported diagnosis    Cancer ([place])    Chronic kidney disease    Diabetes mellitus without complication (HCC)    FH: thoracic aneurysm    Hyperlipidemia    Hypertension    Obesity     Tobacco History: Social History   Tobacco Use  Smoking Status Never  Smokeless Tobacco Current   Types: Chew   Ready to quit: Not Answered Counseling given: Not Answered   Outpatient Medications Prior to Visit  Medication Sig Dispense Refill   acetaminophen (TYLENOL) 500 MG tablet Take 1,000 mg by mouth in the morning.     albuterol (VENTOLIN HFA) 108 (90 Base) MCG/ACT inhaler Inhale 2 puffs into the lungs every 4 (four) hours.     amLODipine (NORVASC) 10 MG  tablet Take 10 mg by mouth daily.     atorvastatin (LIPITOR) 80 MG tablet Take 80 mg by mouth daily.     budesonide-formoterol (SYMBICORT) 80-4.5 MCG/ACT inhaler Take 2 puffs first thing in am and then another 2 puffs about 12 hours later. 1 each 12   cetirizine (ZYRTEC) 10 MG tablet Take 10 mg by mouth in the morning.     EPINEPHrine 0.3 mg/0.3 mL IJ SOAJ injection Inject 0.3 mg into the muscle as needed for anaphylaxis.     ezetimibe (ZETIA) 10 MG tablet Take 10 mg by mouth daily.     famotidine  (PEPCID) 20 MG tablet One after supper (Patient taking differently: Take 20 mg by mouth daily after supper.) 30 tablet 11   fluticasone (FLONASE) 50 MCG/ACT nasal spray Place 2 sprays into both nostrils in the morning and at bedtime.     JARDIANCE 25 MG TABS tablet Take 25 mg by mouth daily.     losartan (COZAAR) 100 MG tablet Take 100 mg by mouth daily.     metFORMIN (GLUCOPHAGE) 1000 MG tablet Take 1,000 mg by mouth 2 (two) times daily with a meal.     pantoprazole (PROTONIX) 40 MG tablet Take 1 tablet (40 mg total) by mouth daily. Take 30-60 min before first meal of the day (Patient taking differently: Take 40 mg by mouth daily before breakfast. Take 30-60 min before first meal of the day) 30 tablet 2   prochlorperazine (COMPAZINE) 10 MG tablet TAKE 1 TABLET BY MOUTH EVERY 6 HOURS AS NEEDED FOR NAUSEA OR VOMITING. 30 tablet 0   REPATHA 140 MG/ML SOSY Inject 140 mg into the skin every 14 (fourteen) days.     tamsulosin (FLOMAX) 0.4 MG CAPS capsule Take 1 capsule (0.4 mg total) by mouth daily. 14 capsule 0   tirzepatide (MOUNJARO) 10 MG/0.5ML Pen Inject 10 mg into the skin once a week. (Patient taking differently: Inject 10 mg into the skin every Friday.) 6 mL 3   docusate sodium (COLACE) 100 MG capsule Take 1 capsule (100 mg total) by mouth 2 (two) times daily. (Patient not taking: Reported on 09/03/2022)     oxyCODONE (OXY IR/ROXICODONE) 5 MG immediate release tablet Take 1-2 tablets (5-10 mg total) by mouth every 6 (six) hours as needed for severe pain or moderate pain. (Patient not taking: Reported on 09/03/2022) 20 tablet 0   promethazine-dextromethorphan (PROMETHAZINE-DM) 6.25-15 MG/5ML syrup Take 5-10 mLs by mouth 4 (four) times daily as needed for cough. (Patient not taking: Reported on 09/03/2022) 240 mL 0   No facility-administered medications prior to visit.     Review of Systems:   Constitutional: No weight loss or gain, night sweats, fevers, chills +fatigue, lassitude. HEENT: No  headaches, difficulty swallowing, tooth/dental problems, or sore throat. No sneezing, itching, ear ache, nasal congestion, or post nasal drip CV:  No chest pain, orthopnea, PND, swelling in lower extremities, anasarca, dizziness, palpitations, syncope Resp: +shortness of breath with exertion and at rest; chest tightness; wheezing; dry cough; chest congestion. No hemoptysis. No chest wall deformity GI:  No heartburn, indigestion, abdominal pain, nausea, vomiting, diarrhea, loss of appetite GU: No dysuria, change in color of urine, urgency or frequency. Skin: No rash, lesions, ulcerations MSK:  No joint pain or swelling.   Neuro: No dizziness or lightheadedness.  Psych: No depression or anxiety. Mood stable.     Physical Exam:  BP 138/84   Pulse 80   Ht '5\' 10"'$  (1.778 m)   Wt 244  lb 9.6 oz (110.9 kg)   SpO2 92%   BMI 35.10 kg/m   GEN: Pleasant, interactive, well-kempt; in no acute distress. HEENT:  Normocephalic and atraumatic. PERRLA. Sclera white. Nasal turbinates pink, moist and patent bilaterally. No rhinorrhea present. Oropharynx pink and moist, without exudate or edema. No lesions, ulcerations, or postnasal drip.  NECK:  Supple w/ fair ROM. No JVD present. Normal carotid impulses w/o bruits. Thyroid symmetrical with no goiter or nodules palpated. No lymphadenopathy.   CV: RRR, no m/r/g, no peripheral edema. Pulses intact, +2 bilaterally. No cyanosis, pallor or clubbing. PULMONARY:  Increased work of breathing. Regular rate. Scattered wheezes bilaterally A&P. No accessory muscle use.  GI: BS present and normoactive. Soft, non-tender to palpation. No organomegaly or masses detected.  MSK: No erythema, warmth or tenderness. Cap refil <2 sec all extrem. No deformities or joint swelling noted.  Neuro: A/Ox3. No focal deficits noted.   Skin: Warm, no lesions or rashe Psych: Normal affect and behavior. Judgement and thought content appropriate.     Lab Results:  CBC    Component  Value Date/Time   WBC 6.4 08/23/2022 1110   WBC 10.8 (H) 06/19/2022 0514   RBC 5.01 08/23/2022 1110   HGB 15.9 08/23/2022 1110   HCT 46.0 08/23/2022 1110   PLT 239 08/23/2022 1110   MCV 91.8 08/23/2022 1110   MCH 31.7 08/23/2022 1110   MCHC 34.6 08/23/2022 1110   RDW 13.6 08/23/2022 1110   LYMPHSABS 1.9 08/23/2022 1110   MONOABS 0.4 08/23/2022 1110   EOSABS 0.6 (H) 08/23/2022 1110   BASOSABS 0.1 08/23/2022 1110    BMET    Component Value Date/Time   NA 137 08/23/2022 1110   NA 139 07/25/2022 0935   K 4.7 08/23/2022 1110   CL 103 08/23/2022 1110   CO2 25 08/23/2022 1110   GLUCOSE 195 (H) 08/23/2022 1110   BUN 21 (H) 08/23/2022 1110   BUN 23 07/25/2022 0935   CREATININE 1.26 (H) 08/23/2022 1110   CALCIUM 9.6 08/23/2022 1110   GFRNONAA >60 08/23/2022 1110    BNP    Component Value Date/Time   BNP 27.9 06/14/2022 1440     Imaging:  DG Chest 2 View  Result Date: 09/03/2022 CLINICAL DATA:  Chest congestion.  Increased shortness of breath. EXAM: CHEST - 2 VIEW COMPARISON:  AP chest 06/14/2022 FINDINGS: Cardiac silhouette and mediastinal contours are within normal limits. The lungs are clear. No pulmonary edema, pleural effusion, or pneumothorax. Mild-to-moderate multilevel degenerative disc changes of the thoracic spine. IMPRESSION: No active cardiopulmonary disease. Electronically Signed   By: Yvonne Kendall M.D.   On: 09/03/2022 15:01    ipratropium-albuterol (DUONEB) 0.5-2.5 (3) MG/3ML nebulizer solution 3 mL     Date Action Dose Route User   09/03/2022 1423 Given 3 mL Nebulization Isley, Holly E, CMA      methylPREDNISolone acetate (DEPO-MEDROL) injection 120 mg     Date Action Dose Route User   09/03/2022 1422 Given 120 mg Intramuscular (Left Ventrogluteal) Priscille Kluver, CMA      pembrolizumab [facility]) 200 mg in sodium chloride 0.9 % 50 mL chemo infusion     Date Action Dose Route User   08/02/2022 0927 Infusion Verify (none) Intravenous Person, Blaine Hamper, RN   08/02/2022 5093 Rate/Dose Change (none) Intravenous Person, Blaine Hamper, RN   08/02/2022 2671 New Bag/Given 200 mg Intravenous Person, Brooke A, RN      pembrolizumab (KEYTRUDA) 200 mg in sodium chloride 0.9 % 50  mL chemo infusion     Date Action Dose Route User   08/23/2022 1322 Infusion Verify (none) Intravenous Dionne Ano, RN   08/23/2022 1307 Rate/Dose Change (none) Intravenous Dionne Ano, RN   08/23/2022 1307 New Bag/Given 200 mg Intravenous Dionne Ano, RN      0.9 %  sodium chloride infusion     Date Action Dose Route User   08/02/2022 1009 Rate/Dose Change (none) Intravenous Person, Blaine Hamper, RN   08/02/2022 1004 Rate/Dose Change (none) Intravenous Person, Blaine Hamper, RN   08/02/2022 0912 New Bag/Given (none) Intravenous Person, Brooke A, RN      0.9 %  sodium chloride infusion     Date Action Dose Route User   08/23/2022 1352 Rate/Dose Change (none) Intravenous Dionne Ano, RN   08/23/2022 1347 Rate/Dose Change (none) Intravenous Dionne Ano, RN   08/23/2022 1245 New Bag/Given (none) Intravenous Dionne Ano, RN           No data to display          Lab Results  Component Value Date   NITRICOXIDE 29 07/25/2022        Assessment & Plan:   Asthma exacerbation Acute exacerbation of asthmatic bronchitis with significant bronchospasm on exam. He had clinical improvement with duoneb in office. We will treat him with depo inj 120 mg x 1, prednisone taper, and empiric azithromycin. Urgent orders placed for nebulizer machine. Instructed him to use his rescue inhaler every 4-6 hours until neb machine arrives then he will use this 4 times a day. CXR to rule out superimposed infection. He was able to maintain O2 sats >90% with exertion on room air in office. Strict ED precautions and close follow up. I am concerned with timing of restarting Keytruda and symptom onset. His respiratory status will need to be closely monitored if he continues  with infusions. Evaluate CXR for infiltrates today.   Patient Instructions  -Continue Albuterol inhaler 2 puffs or 3 mL neb every 6 hours as needed for shortness of breath or wheezing. Notify if symptoms persist despite rescue inhaler/neb use. Orders placed for nebulizer today. You should use your breathing treatments at least four times a day until symptoms improve -Continue Symbicort 2 puffs Twice daily. Brush tongue and rinse mouth afterwards -Continue Zyrtec 1 tab daily for allergies -Continue flonase nasal spray 2 sprays each nostril daily -Continue protonix 1 tab daily  Prednisone taper. 4 tabs for 2 days, then 3 tabs for 2 days, 2 tabs for 2 days, then 1 tab for 2 days, then stop. Take in AM with food. Start tomorrow Azithromycin 2 tablets on day one then 1 tablet daily for four additional days. Take with food Guaifenesin [phone] mg Twice daily for congestion  Chest x ray today   Follow up in one week with Dr. Melvyn Novas or Alanson Aly. If symptoms do not improve or worsen, please contact office for sooner follow up or seek emergency care.    Acute respiratory failure with hypoxia (HCC) Reported O2 levels between mid 80's-low 90's at home. He was able to maintain saturations on room air today with SpO2 low 92%. Advised him to closely monitor for goal >88-90%. If no improvement with rescue or rest, needs to go to the ED. [place] low probability for PE; symptoms/clinical apperance corresponds to asthma exacerbation.  I spent 45 minutes of dedicated to the care of this patient on the date of this encounter to include pre-visit review of records,  face-to-face time with the patient discussing conditions above, post visit ordering of testing, clinical documentation with the electronic health record, making appropriate referrals as documented, and communicating necessary findings to members of the patients care team.  Clayton Bibles, NP 09/03/2022  Pt aware and understands NP's role.

## 2022-09-05 ENCOUNTER — Other Ambulatory Visit: Payer: Self-pay | Admitting: Internal Medicine

## 2022-09-05 DIAGNOSIS — R053 Chronic cough: Secondary | ICD-10-CM

## 2022-09-05 DIAGNOSIS — J4541 Moderate persistent asthma with (acute) exacerbation: Secondary | ICD-10-CM | POA: Diagnosis not present

## 2022-09-07 NOTE — Telephone Encounter (Signed)
Received the following message from patient:   " This week, I have been taking my meds (Prednisone, Z-pack, Mucinex, Nebulizer w/Albuterol, Symbicort, & using the Albuterol inhaler as needed).  My wife has been using a Paediatric nurse on my back several times a day, yet I am still not coughing up anything.  I am having a hard time keeping my O2 sat rate between 87 & 91.  Is there anything else I should be doing? I am also doing controlled breathing (in through the nose and out through the mouth).   "  Joellen Jersey, can you please advise? Thanks!

## 2022-09-07 NOTE — Telephone Encounter (Signed)
Chest x ray from when he was here was clear and his O2 levels in office were better than readings at home. If he is feeling at all better, I would encourage him to continue his steroids and antibiotics as prescribed and make sure he is doing his nebulizer treatments four times a day. He can do them up to every 4 hours, if needed. He should use the Symbicort after his AM and PM neb. If he recovers quickly from the 87 with rest, then ok to continue to monitor. Make sure hands are warm when checking. He should also bring his pulse ox in for his f/u so we can compare.   If he has any worsening, is not improving at all, or O2 levels are staying at or below 88%, he should go to the ED.

## 2022-09-07 NOTE — Progress Notes (Signed)
Chest x ray was clear without any evidence of pneumonia. Please ensure patient is feeling better. Thanks!

## 2022-09-13 ENCOUNTER — Inpatient Hospital Stay (HOSPITAL_BASED_OUTPATIENT_CLINIC_OR_DEPARTMENT_OTHER): Payer: BC Managed Care – PPO | Admitting: Oncology

## 2022-09-13 ENCOUNTER — Inpatient Hospital Stay: Payer: BC Managed Care – PPO

## 2022-09-13 ENCOUNTER — Inpatient Hospital Stay: Payer: BC Managed Care – PPO | Attending: Oncology

## 2022-09-13 VITALS — BP 141/81 | HR 85 | Temp 97.7°F | Resp 18 | Ht 70.0 in | Wt 246.5 lb

## 2022-09-13 DIAGNOSIS — J9690 Respiratory failure, unspecified, unspecified whether with hypoxia or hypercapnia: Secondary | ICD-10-CM | POA: Insufficient documentation

## 2022-09-13 DIAGNOSIS — Z5112 Encounter for antineoplastic immunotherapy: Secondary | ICD-10-CM | POA: Insufficient documentation

## 2022-09-13 DIAGNOSIS — Z79899 Other long term (current) drug therapy: Secondary | ICD-10-CM | POA: Insufficient documentation

## 2022-09-13 DIAGNOSIS — J449 Chronic obstructive pulmonary disease, unspecified: Secondary | ICD-10-CM | POA: Insufficient documentation

## 2022-09-13 DIAGNOSIS — C642 Malignant neoplasm of left kidney, except renal pelvis: Secondary | ICD-10-CM

## 2022-09-13 LAB — CBC WITH DIFFERENTIAL (CANCER CENTER ONLY)
Abs Immature Granulocytes: 0.03 10*3/uL (ref 0.00–0.07)
Basophils Absolute: 0.1 10*3/uL (ref 0.0–0.1)
Basophils Relative: 1 %
Eosinophils Absolute: 0.4 10*3/uL (ref 0.0–0.5)
Eosinophils Relative: 4 %
HCT: 45.3 % (ref 39.0–52.0)
Hemoglobin: 15.6 g/dL (ref 13.0–17.0)
Immature Granulocytes: 0 %
Lymphocytes Relative: 11 %
Lymphs Abs: 1 10*3/uL (ref 0.7–4.0)
MCH: 32.6 pg (ref 26.0–34.0)
MCHC: 34.4 g/dL (ref 30.0–36.0)
MCV: 94.8 fL (ref 80.0–100.0)
Monocytes Absolute: 0.9 10*3/uL (ref 0.1–1.0)
Monocytes Relative: 9 %
Neutro Abs: 6.8 10*3/uL (ref 1.7–7.7)
Neutrophils Relative %: 75 %
Platelet Count: 208 10*3/uL (ref 150–400)
RBC: 4.78 MIL/uL (ref 4.22–5.81)
RDW: 13.9 % (ref 11.5–15.5)
WBC Count: 9.1 10*3/uL (ref 4.0–10.5)
nRBC: 0 % (ref 0.0–0.2)

## 2022-09-13 LAB — CMP (CANCER CENTER ONLY)
ALT: 19 U/L (ref 0–44)
AST: 17 U/L (ref 15–41)
Albumin: 4.4 g/dL (ref 3.5–5.0)
Alkaline Phosphatase: 66 U/L (ref 38–126)
Anion gap: 10 (ref 5–15)
BUN: 21 mg/dL — ABNORMAL HIGH (ref 6–20)
CO2: 24 mmol/L (ref 22–32)
Calcium: 9.5 mg/dL (ref 8.9–10.3)
Chloride: 102 mmol/L (ref 98–111)
Creatinine: 1.27 mg/dL — ABNORMAL HIGH (ref 0.61–1.24)
GFR, Estimated: >60 mL/min (ref >60)
Glucose, Bld: 151 mg/dL — ABNORMAL HIGH (ref 70–99)
Potassium: 4.7 mmol/L (ref 3.5–5.1)
Sodium: 136 mmol/L (ref 135–145)
Total Bilirubin: 0.5 mg/dL (ref 0.3–1.2)
Total Protein: 7.2 g/dL (ref 6.5–8.1)

## 2022-09-13 LAB — TSH: TSH: 8.467 u[IU]/mL — ABNORMAL HIGH (ref 0.350–4.500)

## 2022-09-13 MED ORDER — SODIUM CHLORIDE 0.9 % IV SOLN
200.0000 mg | Freq: Once | INTRAVENOUS | Status: AC
  Administered 2022-09-13: 200 mg via INTRAVENOUS
  Filled 2022-09-13: qty 200

## 2022-09-13 MED ORDER — SODIUM CHLORIDE 0.9 % IV SOLN: Freq: Once | INTRAVENOUS | Status: AC

## 2022-09-13 NOTE — Patient Instructions (Signed)
[facility]  Discharge Instructions: Thank you for choosing [facility] to provide your oncology and hematology care.   If you have a lab appointment with the Cancer Center, please go directly to the Cancer Center and check in at the registration area.   Wear comfortable clothing and clothing appropriate for easy access to any Portacath or PICC line.   We strive to give you quality time with your provider. You may need to reschedule your appointment if you arrive late (15 or more minutes).  Arriving late affects you and other patients whose appointments are after yours.  Also, if you miss three or more appointments without notifying the office, you may be dismissed from the clinic at the provider's discretion.      For prescription refill requests, have your pharmacy contact our office and allow 72 hours for refills to be completed.    Today you received the following chemotherapy and/or immunotherapy agents keytruda      To help prevent nausea and vomiting after your treatment, we encourage you to take your nausea medication as directed.  BELOW ARE SYMPTOMS THAT SHOULD BE REPORTED IMMEDIATELY: *FEVER GREATER THAN 100.4 F (38 C) OR HIGHER *CHILLS OR SWEATING *NAUSEA AND VOMITING THAT IS NOT CONTROLLED WITH YOUR NAUSEA MEDICATION *UNUSUAL SHORTNESS OF BREATH *UNUSUAL BRUISING OR BLEEDING *URINARY PROBLEMS (pain or burning when urinating, or frequent urination) *BOWEL PROBLEMS (unusual diarrhea, constipation, pain near the anus) TENDERNESS IN MOUTH AND THROAT WITH OR WITHOUT PRESENCE OF ULCERS (sore throat, sores in mouth, or a toothache) UNUSUAL RASH, SWELLING OR PAIN  UNUSUAL VAGINAL DISCHARGE OR ITCHING   Items with * indicate a potential emergency and should be followed up as soon as possible or go to the Emergency Department if any problems should occur.  Please show the CHEMOTHERAPY ALERT CARD or IMMUNOTHERAPY ALERT CARD at check-in to  the Emergency Department and triage nurse.  Should you have questions after your visit or need to cancel or reschedule your appointment, please contact [facility]  Dept: [phone]  and follow the prompts.  Office hours are 8:00 a.m. to 4:30 p.m. Monday - Friday. Please note that voicemails left after 4:00 p.m. may not be returned until the following business day.  We are closed weekends and major holidays. You have access to a nurse at all times for urgent questions. Please call the main number to the clinic Dept: [phone] and follow the prompts.   For any non-urgent questions, you may also contact your provider using MyChart. We now offer e-Visits for anyone 18 and older to request care online for non-urgent symptoms. For details visit [email or web address].   Also download the MyChart app! Go to the app store, search "MyChart", open the app, select [facility], and log in with your MyChart username and password.  Masks are optional in the cancer centers. If you would like for your care team to wear a mask while they are taking care of you, please let them know. You may have one support person who is at least 55 years old accompany you for your appointments. 

## 2022-09-13 NOTE — Progress Notes (Signed)
Hematology and Oncology Follow Up Visit  Russell Burns [id number] [birth year] 56 y.o. 09/13/2022 8:36 AM [name], MDShadad, [name], MD   Principle Diagnosis: 46 year old man with kidney cancer diagnosed in February 2023.  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   Prior Therapy: He is status post left laparoscopic radical nephrectomy completed on 03/12/2022 under the care of Dr. Gloriann Loan.  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    Current therapy: Pembrolizumab 200 mg every 3 weeks started on May 10, 2022. He is here for cycle 5 of therapy.  Interim History: Russell Burns returns today for a follow-up.  Since last visit, he reports feeling well without any major complaints.  He did develop upper respiratory tract infection which is resolving at this time.  He does report productive cough but no shortness of breath or difficulty breathing.  Continues to be active and attends to activities of daily living.  He denies any nausea, vomiting, skin rash or itching.  He denies any diarrhea or excessive fatigue.  He denies any arthralgias or myalgias.     Medications: Updated on review. Current Outpatient Medications  Medication Sig Dispense Refill   acetaminophen (TYLENOL) 500 MG tablet Take 1,000 mg by mouth in the morning.     albuterol (PROVENTIL) (2.5 MG/3ML) 0.083% nebulizer solution Take 3 mLs (2.5 mg total) by nebulization every 6 (six) hours as needed for wheezing or shortness of breath. 75 mL 12   albuterol (VENTOLIN HFA) 108 (90 Base) MCG/ACT inhaler Inhale 2 puffs into the lungs every 4 (four) hours.     amLODipine (NORVASC) 10 MG tablet Take 10 mg by mouth daily.     atorvastatin (LIPITOR) 80 MG tablet Take 80 mg by mouth daily.     azithromycin (ZITHROMAX) 250 MG tablet Take 2 tablets on day one then 1  tablet daily for four additional days 6 tablet 0   budesonide-formoterol (SYMBICORT) 80-4.5 MCG/ACT inhaler Take 2 puffs first thing in am and then another 2 puffs about 12 hours later. 1 each 12   cetirizine (ZYRTEC) 10 MG tablet Take 10 mg by mouth in the morning.     docusate sodium (COLACE) 100 MG capsule Take 1 capsule (100 mg total) by mouth 2 (two) times daily. (Patient not taking: Reported on 09/03/2022)     EPINEPHrine 0.3 mg/0.3 mL IJ SOAJ injection Inject 0.3 mg into the muscle as needed for anaphylaxis.     ezetimibe (ZETIA) 10 MG tablet Take 10 mg by mouth daily.     famotidine (PEPCID) 20 MG tablet One after supper (Patient taking differently: Take 20 mg by mouth daily after supper.) 30 tablet 11   fluticasone (FLONASE) 50 MCG/ACT nasal spray Place 2 sprays into both nostrils in the morning and at bedtime.     JARDIANCE 25 MG TABS tablet Take 25 mg by mouth daily.     losartan (COZAAR) 100 MG tablet Take 100 mg by mouth daily.     metFORMIN (GLUCOPHAGE) 1000 MG tablet Take 1,000 mg by mouth 2 (two) times daily with a meal.     oxyCODONE (OXY IR/ROXICODONE) 5 MG immediate release tablet Take 1-2 tablets (5-10 mg total) by mouth every 6 (six) hours as needed for severe pain or moderate pain. (Patient not taking: Reported on 09/03/2022) 20 tablet 0  pantoprazole (PROTONIX) 40 MG tablet TAKE 1 TABLET BY MOUTH DAILY. TAKE 30-60 MIN BEFORE FIRST MEAL OF THE DAY 90 tablet 2   predniSONE (DELTASONE) 10 MG tablet 4 tabs for 2 days, then 3 tabs for 2 days, 2 tabs for 2 days, then 1 tab for 2 days, then stop 20 tablet 0   prochlorperazine (COMPAZINE) 10 MG tablet TAKE 1 TABLET BY MOUTH EVERY 6 HOURS AS NEEDED FOR NAUSEA OR VOMITING. 30 tablet 0   promethazine-dextromethorphan (PROMETHAZINE-DM) 6.25-15 MG/5ML syrup Take 5-10 mLs by mouth 4 (four) times daily as needed for cough. (Patient not taking: Reported on 09/03/2022) 240 mL 0   REPATHA 140 MG/ML SOSY Inject 140 mg into the skin every 14  (fourteen) days.     tamsulosin (FLOMAX) 0.4 MG CAPS capsule Take 1 capsule (0.4 mg total) by mouth daily. 14 capsule 0   tirzepatide (MOUNJARO) 10 MG/0.5ML Pen Inject 10 mg into the skin once a week. (Patient taking differently: Inject 10 mg into the skin every Friday.) 6 mL 3   Current Facility-Administered Medications  Medication Dose Route Frequency Provider Last Rate Last Admin   ipratropium-albuterol (DUONEB) 0.5-2.5 (3) MG/3ML nebulizer solution 3 mL  3 mL Nebulization Q6H Cobb, Karie Schwalbe, NP   3 mL at 09/03/22 1423     Allergies:  Allergies  Allergen Reactions   Bee Venom Anaphylaxis   Other Itching, Swelling and Other (See Comments)    Feline dander- Runny nose, itchy/puffy eyes     Physical Exam:  Blood pressure (!) 141/81, pulse 85, temperature 97.7 F (36.5 C), temperature source Temporal, resp. rate 18, height '5\' 10"'$  (1.778 m), weight 246 lb 8 oz (111.8 kg), SpO2 96 %.   ECOG: 1   General appearance: Comfortable appearing without any discomfort Head: Normocephalic without any trauma Oropharynx: Mucous membranes are moist and pink without any thrush or ulcers. Eyes: Pupils are equal and round reactive to light. Lymph nodes: No cervical, supraclavicular, inguinal or axillary lymphadenopathy.   Heart:regular rate and rhythm.  S1 and S2 without leg edema. Lung: Clear without any rhonchi or wheezes.  No dullness to percussion. Abdomin: Soft, nontender, nondistended with good bowel sounds.  No hepatosplenomegaly. Musculoskeletal: No joint deformity or effusion.  Full range of motion noted. Neurological: No deficits noted on motor, sensory and deep tendon reflex exam. Skin: No petechial rash or dryness.  Appeared moist.        Lab Results: Lab Results  Component Value Date   WBC 6.4 08/23/2022   HGB 15.9 08/23/2022   HCT 46.0 08/23/2022   MCV 91.8 08/23/2022   PLT 239 08/23/2022     Chemistry      Component Value Date/Time   NA 137 08/23/2022 1110    NA 139 07/25/2022 0935   K 4.7 08/23/2022 1110   CL 103 08/23/2022 1110   CO2 25 08/23/2022 1110   BUN 21 (H) 08/23/2022 1110   BUN 23 07/25/2022 0935   CREATININE 1.26 (H) 08/23/2022 1110      Component Value Date/Time   CALCIUM 9.6 08/23/2022 1110   ALKPHOS 65 08/23/2022 1110   AST 15 08/23/2022 1110   ALT 19 08/23/2022 1110   BILITOT 0.6 08/23/2022 1110         Impression and Plan:  56 year old with:  1. T3a left clear-cell renal cell carcinoma without any evidence of metastatic disease diagnosed in June 2023.   He is currently on adjuvant Pembrolizumab without any major complications.  Risks and benefits  of continuing this treatment to complete a year of treatment or 17 cycles were reiterated.  Complications that include nausea, fatigue and autoimmune considerations were reiterated.  He is agreeable to proceed at this time.  He will continue to have periodic imaging evaluation under the care of [facility] every 6 months.     2.  Antiemetics: Compazine is available to him without any nausea.  3.  IV access: Port-A-Cath option has been deferred.  Peripheral veins are currently in use.  4.  Immune mediated issues: Complications such as pneumonitis, colitis, thyroid disease were reiterated.  I will continue to monitor closely.  5.  Respiratory failure: His respiratory status is stable with COPD under control without any exacerbation.  No evidence of pneumonitis.   6.  Follow-up: In 3 weeks for the next cycle of therapy.   30  minutes were spent on this visit.  The time was dedicated to updating disease status, treatment choices and outlining future plan of care review.  Zola Button, MD 12/21/20238:36 AM

## 2022-09-14 ENCOUNTER — Ambulatory Visit (INDEPENDENT_AMBULATORY_CARE_PROVIDER_SITE_OTHER): Payer: BC Managed Care – PPO | Admitting: Internal Medicine

## 2022-09-14 ENCOUNTER — Encounter: Payer: Self-pay | Admitting: Internal Medicine

## 2022-09-14 VITALS — BP 110/68 | HR 95 | Temp 98.6°F | Ht 70.0 in | Wt 242.0 lb

## 2022-09-14 DIAGNOSIS — R0609 Other forms of dyspnea: Secondary | ICD-10-CM

## 2022-09-14 DIAGNOSIS — J4531 Mild persistent asthma with (acute) exacerbation: Secondary | ICD-10-CM | POA: Diagnosis not present

## 2022-09-14 DIAGNOSIS — R053 Chronic cough: Secondary | ICD-10-CM

## 2022-09-14 NOTE — Progress Notes (Unsigned)
Russell Burns, male    DOB: 1966-05-03   MRN: [id number]   Brief patient profile:  78   yowm never smoker seasonal rhinitis spring > fall rx drixoral  referred to pulmonary clinic 06/11/2022 by Russell Burns for sob/ wife Russell Burns  Has had good ex tolerance / very active up and down steps then 1st covid Dec 2020 got infusion sick 3 weeks but back to baseline then 2nd infection just sniffles oct 2022 then Jan 2023 severe coughing fits with sob mostly daytime then April  2023 dx renal cell  cancer cough syncope   > admit     Admit date: 01/01/2022 Discharge date: 01/07/2022   Time spent: 50 minutes   Recommendations for Outpatient Follow-up:      Acute respiratory failure with hypoxia ([place]) -Presented with hypoxia down to 88 to 89% requiring 3 L via nasal cannula.  He reports about 4 months of symptoms of nonproductive cough with exertional dyspnea and orthopnea.  CTA of the chest is negative for pulmonary embolism but showed findings suggestive of small airway disease with mucous plugging of the lower lobes.  CTA chest in January  showed airway debris bilaterally concerning for bronchitis versus aspiration.  He has no formal diagnosis of COPD and only has remote history of tobacco use 20 years ago,but reports -maternal uncle with COPD due to alpha antitrypsin deficiency. -4/11 Alpha-1 antitrypsin pending -Unclear if this is really COPD. he will need outpatient PFT   -DuoNeb QID -Trial of nebulized hypertonic saline and inhaled steroid for mucolytic.  -4/11 chest physiotherapy TID.  -daily IV Solu-Medrol '60mg'$  -4/12 resolved: DC Solu-Medrol - 4/12 Prednisone 50 mg x 5 days    Left Renal mass/suspicious cyst of the upper pole RIGHT kidney -4/11 review of CT scanning of abdomen 4/4 shows mass in LEFT kidney consistent with malignancy, and suspicious mass in right kidney. - 4/12 discussed case with Russell Burns Urology stated equipment required to remove kidney not in hospital.  Patient to  follow-up upon discharge, to discuss  LEFT kidney resection -Follow-up with urologist ASAP upon discharge   HLD (hyperlipidemia) Continue home meds once med rec is complete   Thoracic aortic aneurysm ([place]) -Stable 4.6 cm ascending aortic aneurysm.  See CTA PE protocol below -4/11 patient never seen cardiothoracic surgery, and has SIGNIFICANT family history of ruptured AAA in his family. - 4/12 discussed case with Russell. Roxan Burns Cardiothoracic Surgery. patient is to follow-up with him in 6 months.     Chronic diastolic CHF/Moderate AS -Echo in 09/2021 with EF of 55 to 60% and grade 1 diastolic dysfunction. - If symptoms do not improve with pulmonary therapy, could consider consult to cardiology to discuss whether symptoms are related to his moderate aortic stenosis.     Essential hypertension -BP goal <120/80 if patient can tolerate, secondary to AAA. - Amlodipine 10 mg daily - Losartan 100 mg daily -Hydralazine PRN SBP> 130 or DBP> 90 -4/12 BP fairly well controlled on current medication   Diabetes mellitus without complication (HCC) - Discharged on home medication   Obese (BMI 34.4 kg/m) -Discuss weight loss strategies with PCP     Discharge Diagnoses:  Principal Problem:   Acute respiratory failure with hypoxia ([place])   Diabetes mellitus without complication ([place])   Essential hypertension   Thoracic aortic aneurysm (HCC)   HLD (hyperlipidemia)   Left renal mass   Chronic diastolic CHF (congestive heart failure) ([place])      History of Present Illness  06/11/2022  Pulmonary/  1st office eval/Russell Burns  was 80% better p admit until a week p 1st use of Keytruda which was on 05/10/22  Chief Complaint  Patient presents with   Consult    Referred by PCP for cough and increased SOB. States this has been going on since January 2023. Was in the hospital back in April 2023 for the same concern.   Dyspnea:  MMRC3 = can't walk 100 yards even at a slow pace at a flat grade s stopping due  to sob   Cough: worse at hs dry with subjective wheeze  Sleep: bolt upright x 2 weeks  SABA use: anoro daily/ saba not really sure it's helping  Rec Prednisone 10 mg Take 4 for three days 3 for three days 2 for three days 1 for three days and stop  For cough > Promethazine DM 2 tsp four times daily and supplement with oxycodone 5 mg every 4 hours as needed  For tickle >  For drainage / throat tickle try take CHLORPHENIRAMINE  4 mg Prilosec  (20 x 2) 40 mg  (Pantoprazole 40 mg)   Take  30-60 min before first meal of the day and Pepcid (famotidine)  20 mg after supper until return to office - this is the best way to tell whether stomach acid is contributing to your problem.   GERD diet No more [place] or anoro Ok to use albuterol up to 2 puffs every 4 hours if you feel it helps your breathing or cough  Please schedule a follow up office visit in 2 weeks, sooner if needed- to [facility] ER in meantime if getting worse or 02 sats on RA < 90% at rest   Admit date: 06/14/2022 Discharge date: 06/19/2022   Admitted From: Home Disposition:  Home    Brief/Interim Summary:   Patient is a 56 year old male with history of renal cell carcinoma on Keytruda, thoracic aortic aneurysm, hyperlipidemia, hypertension, diabetes type 2 presented with 1 week history of shortness of breath, cough.  Patient was also recently prescribed steroids by his pulmonologist for suspicion of COPD.  On presentation ,he was hemodynamically stable.  Required 2 to 5 L of oxygen for maintenance of saturation.  Chest x-ray did not show any acute findings.  CT PE study was negative for PE but showed bilateral bronchial wall thickening, scattered bibasilar consolidation, mucous plugging.  Imaging suspicious for postobstructive pneumonia.  Patient was started on broad spectrum antibiotics, bronchodilators.  Respiratory status significantly improving.  Currently down to 3 L.  He qualified for home oxygen.  He is medically stable for discharge home  today.  He will follow-up with Russell. Melvyn Burns as an outpatient.     Following problems were addressed during his hospitalization:   Acute hypoxic respiratory failure: Presented with shortness of breath, cough.  Not on oxygen at home.  Required 2 to 5 L of oxygen on presentation.  Patient was started on short course of steroids from outpatient pulmonology without improvement. Chest x-ray did not show any acute findings.  CT PE study was negative for PE but showed bilateral bronchial wall thickening, scattered bibasilar consolidation, mucous plugging.  Imaging suspicious for postobstructive pneumonia.  Patient was started on broad spectrum antibiotics, bronchodilators.  COVID, respiratory viral panel negative.  Patient is bieng treated for renal cell carcinoma so there is possibility of pneumonitis as well.  Being work-up for diagnosis of COPD as an outpatient.  Imaging showed bronchial wall thickening.  Treated with steroids, bronchodilator.  Procalcitonin non-reassuring. He feels  much better .  We requested for chest physiotherapy. Continue incentive spirometer, flutter valve.  Current down to 2 L.  He qualified for home oxygen for 3 L .  Continue prednisone on tapering dose.     Renal cell carcinoma: Currently on Keytruda, follows with Russell. Alen Blew.  Status post left nephrectomy.  Pathology showed clear-cell renal cell carcinoma.  Continue home tamsulosin   History of thoracic aortic aneurysm: Stable.  Imaging showed 4.6 cm ascending thoracic aortic aneurysm.  Semiannual follow-up with CTA/MRI recommended.  He needs to follow-up with vascular surgery as an outpatient.  He is scheduled to follow-up with Russell. Roxan Burns in 6 months    History of diastolic CHF: Last echo showed EF of 55 to 34%, grade 1 diastolic dysfunction.  Currently not on diuretics.          Discharge Diagnoses:  Principal Problem:   Acute respiratory failure with hypoxia (HCC) Active Problems:   Diabetes mellitus without  complication (HCC)   Essential hypertension   HLD (hyperlipidemia)   Chronic diastolic CHF (congestive heart failure) ([place])   Malignant neoplasm of left kidney (HCC)   GERD (gastroesophageal reflux disease)       06/27/2022  f/u ov/Russell Burns re: acute resp failure   maint on prn ventolin and last dose of pred 06/27/2022  Chief Complaint  Patient presents with   Follow-up    Better 94%, sob and cough-gray better   Dyspnea:  across parking lots ok with or without 02  Cough: a bit of grey mucus still mostly in am  Sleeping: recliner 20 degrees = baseline  SABA use: twice daily  02: 3lpm hs and prn  Covid status:  vax max  Rec Plan A = Automatic = Always=    Symbicort 80 Take 2 puffs first thing in am and then another 2 puffs about 12 hours later.   Work on inhaler technique:  Plan B = Backup (to supplement plan A, not to replace it) Only use your albuterol inhaler as a rescue medication  Ok to try albuterol 15 min before an activity (on alternating days)  that you know would usually make you short of breath Make sure you check your oxygen saturation  AT  your highest level of activity (not after you stop)   to be sure it stays over 90%  Ok to return to work without restrictions Jul 02 2022 but you will access to 02 and your rescue inhaler     07/25/2022  f/u ov/Russell Burns re: acute resp failure secondary to Keytruda pneumonitis  maint on symb 80 2bid    Chief Complaint  Patient presents with   Follow-up    Breathing is much improved. He has not had to use albuterol in the past 2 wks.   Dyspnea:  walking all over the plant, including steps Cough: resolved on h1 bid  Sleeping: on side bed flat  SABA use: not 02: not  Covid status:   vax x 4  Rec -Continue Albuterol inhaler 2 puffs or 3 mL neb every 6 hours as needed for shortness of breath or wheezing. Notify if symptoms persist despite rescue inhaler/neb use. Orders placed for nebulizer today. You should use your breathing treatments at  least four times a day until symptoms improve -Continue Symbicort 2 puffs Twice daily. Brush tongue and rinse mouth afterwards -Continue Zyrtec 1 tab daily for allergies -Continue flonase nasal spray 2 sprays each nostril daily -Continue protonix 1 tab daily  Prednisone taper. 4 tabs for  2 days, then 3 tabs for 2 days, 2 tabs for 2 days, then 1 tab for 2 days, then stop. Take in AM with food. Start tomorrow Azithromycin 2 tablets on day one then 1 tablet daily for four additional days. Take with food Guaifenesin [id number] mg Twice daily for congestion    09/14/2022  f/u ov/Russell Burns re: ? Inhalational injury at work vs [place] toxicity maint on symbicort 80 2 bid    and no longer on 02  Chief Complaint  Patient presents with   Follow-up    SaO2 sats at home 92%.  By afternoon 94%.  Did cough up a lot of mucus on 09/12/2022 and 09/13/2022.  Breathing a lot easier now.  Dyspnea:  walking all day 18 k steps sats lower 90s  Cough: tapering off/ was very productive now min rattle  Sleeping: recliner 45 degrees  SABA use:4 pm after symb 80 7am / neb  02: none      No obvious day to day or daytime variability or assoc excess/ purulent sputum or mucus plugs or hemoptysis or cp or chest tightness, subjective wheeze or overt sinus or hb symptoms.   Sleeping as above without nocturnal  or early am exacerbation  of respiratory  c/o's or need for noct saba. Also denies any obvious fluctuation of symptoms with weather or environmental changes or other aggravating or alleviating factors except as outlined above   No unusual exposure hx or h/o childhood pna/ asthma or knowledge of premature birth.  Current Allergies, Complete Past Medical History, Past Surgical History, Family History, and Social History were reviewed in [employer] record.  ROS  The following are not active complaints unless bolded Hoarseness, sore throat, dysphagia, dental problems, itching, sneezing,  nasal  congestion or discharge of excess mucus or purulent secretions, ear ache,   fever, chills, sweats, unintended wt loss or wt gain, classically pleuritic or exertional cp,  orthopnea pnd or arm/hand swelling  or leg swelling, presyncope, palpitations, abdominal pain, anorexia, nausea, vomiting, diarrhea  or change in bowel habits or change in bladder habits, change in stools or change in urine, dysuria, hematuria,  rash, arthralgias, visual complaints, headache, numbness, weakness or ataxia or problems with walking or coordination,  change in mood or  memory.        Current Meds  Medication Sig   acetaminophen (TYLENOL) 500 MG tablet Take 1,000 mg by mouth in the morning.   albuterol (PROVENTIL) (2.5 MG/3ML) 0.083% nebulizer solution Take 3 mLs (2.5 mg total) by nebulization every 6 (six) hours as needed for wheezing or shortness of breath.   albuterol (VENTOLIN HFA) 108 (90 Base) MCG/ACT inhaler Inhale 2 puffs into the lungs every 4 (four) hours.   amLODipine (NORVASC) 10 MG tablet Take 10 mg by mouth daily.   atorvastatin (LIPITOR) 80 MG tablet Take 80 mg by mouth daily.   budesonide-formoterol (SYMBICORT) 80-4.5 MCG/ACT inhaler Take 2 puffs first thing in am and then another 2 puffs about 12 hours later.   cetirizine (ZYRTEC) 10 MG tablet Take 10 mg by mouth in the morning.   EPINEPHrine 0.3 mg/0.3 mL IJ SOAJ injection Inject 0.3 mg into the muscle as needed for anaphylaxis.   ezetimibe (ZETIA) 10 MG tablet Take 10 mg by mouth daily.   famotidine (PEPCID) 20 MG tablet One after supper (Patient taking differently: Take 20 mg by mouth daily after supper.)   fluticasone (FLONASE) 50 MCG/ACT nasal spray Place 2 sprays into both nostrils in the  morning and at bedtime.   JARDIANCE 25 MG TABS tablet Take 25 mg by mouth daily.   losartan (COZAAR) 100 MG tablet Take 100 mg by mouth daily.   metFORMIN (GLUCOPHAGE) 1000 MG tablet Take 1,000 mg by mouth 2 (two) times daily with a meal.   pantoprazole  (PROTONIX) 40 MG tablet TAKE 1 TABLET BY MOUTH DAILY. TAKE 30-60 MIN BEFORE FIRST MEAL OF THE DAY   prochlorperazine (COMPAZINE) 10 MG tablet TAKE 1 TABLET BY MOUTH EVERY 6 HOURS AS NEEDED FOR NAUSEA OR VOMITING.   REPATHA 140 MG/ML SOSY Inject 140 mg into the skin every 14 (fourteen) days.   tamsulosin (FLOMAX) 0.4 MG CAPS capsule Take 1 capsule (0.4 mg total) by mouth daily.   tirzepatide (MOUNJARO) 10 MG/0.5ML Pen Inject 10 mg into the skin once a week. (Patient taking differently: Inject 10 mg into the skin every Friday.)   Current Facility-Administered Medications for the 09/14/22 encounter (Office Visit) with Tanda Rockers, MD  Medication   ipratropium-albuterol (DUONEB) 0.5-2.5 (3) MG/3ML nebulizer solution 3 mL           Past Medical History:  Diagnosis Date   Allergy    Aortic stenosis    Arthritis    Blood transfusion without reported diagnosis    Cancer ([place])    Chronic kidney disease    Diabetes mellitus without complication ([place])    FH: thoracic aneurysm    Hyperlipidemia    Hypertension    Obesity        Objective:     09/14/2022    242  07/25/2022       247  06/27/2022       247   06/11/22 235 lb 6.4 oz (106.8 kg)  05/31/22 239 lb 6.4 oz (108.6 kg)  05/10/22 239 lb (108.4 kg)    Vital signs reviewed  09/14/2022  - Note at rest 02 sats  96% on RA   General appearance:    obese pleasant wm / slt rattling cough     HEENT : Oropharynx  clear   / minimal thrush      NECK :  without  apparent JVD/ palpable Nodes/TM    LUNGS: no acc muscle use,  Nl contour chest which is clear to A and P bilaterally without cough on insp or exp maneuvers   CV:  RRR  no s3 or murmur or increase in P2, and no edema   ABD: obese  soft and nontender   MS:  Nl gait/ ext warm without deformities Or obvious joint restrictions  calf tenderness, cyanosis or clubbing    SKIN: warm and dry without lesions    NEURO:  alert, approp, nl sensorium with  no motor or cerebellar  deficits apparent.    I personally reviewed images and agree with radiology impression as follows:  CXR:   pa and latera 09/03/22  No active cardiopulmonary disease.   Assessment

## 2022-09-14 NOTE — Patient Instructions (Addendum)
Mucinex should be 1200 mg every 12 hours and flutter valve as much as possible  Please schedule a follow up visit in 3 months but call sooner if needed

## 2022-09-15 NOTE — Assessment & Plan Note (Signed)
Onset p ketuda vs inhalational exposure in setting of long term seasonal rhinitis - Eos 1.7 06/11/2022 > rx short course prednisone only as pt is diabetic - 06/11/22    alpha one AT phenotype   MS  Level 129  - 06/27/2022    try symbicort 80 2bid and complete prednisone rx 06/27/2022  - 07/25/2022  After extensive coaching inhaler device,  effectiveness =    90+ % with hfa  - 07/25/2022  FENO 29 on symb 80 2 bid > resumed [place] s apparent adverse effects as of 09/14/2022   All goals of chronic asthma control met including optimal function and elimination of symptoms with minimal need for rescue therapy.  Contingencies discussed in full including contacting this office immediately if not controlling the symptoms using the rule of two's.

## 2022-09-15 NOTE — Assessment & Plan Note (Signed)
Onset p viral syndrome Jan 2023 with neg covid 19 testing  - resolved to his satisfaction 09/14/2022 on symb 80 2bid / gerd rx / mucinex / flutter valve  Adequate control on present rx, reviewed in detail with pt > no change in rx needed

## 2022-09-15 NOTE — Assessment & Plan Note (Signed)
Onset around mid May 17 2022 p Ketruda started 05/10/22 > d/c'd  05/31/22 temporarily and then restarted s flare  - 06/11/2022   Walked on RA  x  2  lap(s) =  approx 500  ft  @ nl pace, stopped due to sob/desats to 87%  But quickly recovered at rest  - 09/14/2022 reported no desats walking up to 18k steps per day   No evidence [place] pulmonary toxicity currently but advised:  Make sure you check your oxygen saturation at your highest level of activity(NOT after you stop)  to be sure it stays over 90% and keep track of it at least once a week, more often if breathing getting worse, and let me know if losing ground. (Collect the dots to connect the dots approach)    F/u q 3 m, sooner if losing ground         Each maintenance medication was reviewed in detail including emphasizing most importantly the difference between maintenance and prns and under what circumstances the prns are to be triggered using an action plan format where appropriate.  Total time for H and P, chart review, counseling, reviewing hfa /neb device(s) and generating customized AVS unique to this office visit / same day charting = 21 min

## 2022-09-30 ENCOUNTER — Other Ambulatory Visit: Payer: Self-pay | Admitting: Oncology

## 2022-10-01 ENCOUNTER — Telehealth: Payer: Self-pay | Admitting: Oncology

## 2022-10-01 NOTE — Telephone Encounter (Signed)
Called patient regarding upcoming January appointments, patient is notified. 

## 2022-10-04 ENCOUNTER — Inpatient Hospital Stay: Payer: BC Managed Care – PPO | Attending: Oncology

## 2022-10-04 ENCOUNTER — Inpatient Hospital Stay (HOSPITAL_BASED_OUTPATIENT_CLINIC_OR_DEPARTMENT_OTHER): Payer: BC Managed Care – PPO | Admitting: Oncology

## 2022-10-04 ENCOUNTER — Inpatient Hospital Stay: Payer: BC Managed Care – PPO

## 2022-10-04 DIAGNOSIS — C642 Malignant neoplasm of left kidney, except renal pelvis: Secondary | ICD-10-CM

## 2022-10-04 DIAGNOSIS — Z5112 Encounter for antineoplastic immunotherapy: Secondary | ICD-10-CM | POA: Diagnosis not present

## 2022-10-04 DIAGNOSIS — J449 Chronic obstructive pulmonary disease, unspecified: Secondary | ICD-10-CM | POA: Insufficient documentation

## 2022-10-04 DIAGNOSIS — J9690 Respiratory failure, unspecified, unspecified whether with hypoxia or hypercapnia: Secondary | ICD-10-CM | POA: Insufficient documentation

## 2022-10-04 DIAGNOSIS — Z79899 Other long term (current) drug therapy: Secondary | ICD-10-CM | POA: Diagnosis not present

## 2022-10-04 LAB — CMP (CANCER CENTER ONLY)
ALT: 27 U/L (ref 0–44)
AST: 18 U/L (ref 15–41)
Albumin: 4.7 g/dL (ref 3.5–5.0)
Alkaline Phosphatase: 61 U/L (ref 38–126)
Anion gap: 9 (ref 5–15)
BUN: 20 mg/dL (ref 6–20)
CO2: 25 mmol/L (ref 22–32)
Calcium: 9.5 mg/dL (ref 8.9–10.3)
Chloride: 102 mmol/L (ref 98–111)
Creatinine: 1.21 mg/dL (ref 0.61–1.24)
GFR, Estimated: >60 mL/min (ref >60)
Glucose, Bld: 125 mg/dL — ABNORMAL HIGH (ref 70–99)
Potassium: 4.7 mmol/L (ref 3.5–5.1)
Sodium: 136 mmol/L (ref 135–145)
Total Bilirubin: 0.4 mg/dL (ref 0.3–1.2)
Total Protein: 7.4 g/dL (ref 6.5–8.1)

## 2022-10-04 LAB — CBC WITH DIFFERENTIAL (CANCER CENTER ONLY)
Abs Immature Granulocytes: 0.01 10*3/uL (ref 0.00–0.07)
Basophils Absolute: 0.1 10*3/uL (ref 0.0–0.1)
Basophils Relative: 1 %
Eosinophils Absolute: 0.2 10*3/uL (ref 0.0–0.5)
Eosinophils Relative: 4 %
HCT: 45.6 % (ref 39.0–52.0)
Hemoglobin: 15.7 g/dL (ref 13.0–17.0)
Immature Granulocytes: 0 %
Lymphocytes Relative: 32 %
Lymphs Abs: 1.7 10*3/uL (ref 0.7–4.0)
MCH: 32.5 pg (ref 26.0–34.0)
MCHC: 34.4 g/dL (ref 30.0–36.0)
MCV: 94.4 fL (ref 80.0–100.0)
Monocytes Absolute: 0.4 10*3/uL (ref 0.1–1.0)
Monocytes Relative: 7 %
Neutro Abs: 3.1 10*3/uL (ref 1.7–7.7)
Neutrophils Relative %: 56 %
Platelet Count: 275 10*3/uL (ref 150–400)
RBC: 4.83 MIL/uL (ref 4.22–5.81)
RDW: 12.7 % (ref 11.5–15.5)
WBC Count: 5.5 10*3/uL (ref 4.0–10.5)
nRBC: 0 % (ref 0.0–0.2)

## 2022-10-04 LAB — TSH: TSH: 8.363 u[IU]/mL — ABNORMAL HIGH (ref 0.350–4.500)

## 2022-10-04 MED ORDER — SODIUM CHLORIDE 0.9 % IV SOLN
200.0000 mg | Freq: Once | INTRAVENOUS | Status: AC
  Administered 2022-10-04: 200 mg via INTRAVENOUS
  Filled 2022-10-04: qty 200

## 2022-10-04 MED ORDER — SODIUM CHLORIDE 0.9 % IV SOLN: Freq: Once | INTRAVENOUS | Status: AC

## 2022-10-04 NOTE — Progress Notes (Signed)
Hematology and Oncology Follow Up Visit  Russell Burns [id number] [birth year] 57 y.o. 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    Prior Therapy: He is status post left laparoscopic radical nephrectomy completed on 03/12/2022 under the care of Russell Burns.  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    Current therapy: Pembrolizumab 200 mg every 3 weeks started on May 10, 2022. He is here for cycle 6 of therapy.  Interim History: Russell Burns presents today for return evaluation.  Since last visit, he reports no major changes in his health.  He denies any shortness of breath, cough or wheezing.  Continues to tolerate Pembrolizumab without any complications.  He denies excessive fatigue, tiredness or weakness.  He denies any skin rash or lesions.  Denies any pruritus or changes in his bowel habits.     Medications: Reviewed without changes. Current Outpatient Medications  Medication Sig Dispense Refill   acetaminophen (TYLENOL) 500 MG tablet Take 1,000 mg by mouth in the morning.     albuterol (PROVENTIL) (2.5 MG/3ML) 0.083% nebulizer solution Take 3 mLs (2.5 mg total) by nebulization every 6 (six) hours as needed for wheezing or shortness of breath. 75 mL 12   albuterol (VENTOLIN HFA) 108 (90 Base) MCG/ACT inhaler Inhale 2 puffs into the lungs every 4 (four) hours.     amLODipine (NORVASC) 10 MG tablet Take 10 mg by mouth daily.     atorvastatin (LIPITOR) 80 MG tablet Take 80 mg by mouth daily.     budesonide-formoterol (SYMBICORT) 80-4.5 MCG/ACT inhaler Take 2 puffs first thing in am and then another 2 puffs about 12 hours later. 1 each 12   cetirizine (ZYRTEC) 10 MG tablet Take 10 mg by mouth in the morning.      EPINEPHrine 0.3 mg/0.3 mL IJ SOAJ injection Inject 0.3 mg into the muscle as needed for anaphylaxis.     ezetimibe (ZETIA) 10 MG tablet Take 10 mg by mouth daily.     famotidine (PEPCID) 20 MG tablet One after supper (Patient taking differently: Take 20 mg by mouth daily after supper.) 30 tablet 11   fluticasone (FLONASE) 50 MCG/ACT nasal spray Place 2 sprays into both nostrils in the morning and at bedtime.     JARDIANCE 25 MG TABS tablet Take 25 mg by mouth daily.     losartan (COZAAR) 100 MG tablet Take 100 mg by mouth daily.     metFORMIN (GLUCOPHAGE) 1000 MG tablet Take 1,000 mg by mouth 2 (two) times daily with a meal.     pantoprazole (PROTONIX) 40 MG tablet TAKE 1 TABLET BY MOUTH DAILY. TAKE 30-60 MIN BEFORE FIRST MEAL OF THE DAY 90 tablet 2   prochlorperazine (COMPAZINE) 10 MG tablet TAKE 1 TABLET BY MOUTH EVERY 6 HOURS AS NEEDED FOR NAUSEA OR VOMITING. 30 tablet 0   REPATHA 140 MG/ML SOSY Inject 140 mg into the skin every 14 (fourteen) days.     tamsulosin (FLOMAX) 0.4 MG CAPS capsule Take 1 capsule (0.4 mg total) by mouth daily. 14 capsule 0   tirzepatide (MOUNJARO) 10 MG/0.5ML Pen Inject 10 mg into the skin once a week. (Patient taking differently: Inject 10 mg into the skin every Friday.) 6 mL 3   Current  Facility-Administered Medications  Medication Dose Route Frequency Provider Last Rate Last Admin   ipratropium-albuterol (DUONEB) 0.5-2.5 (3) MG/3ML nebulizer solution 3 mL  3 mL Nebulization Q6H Cobb, Karie Schwalbe, NP   3 mL at 09/03/22 1423     Allergies:  Allergies  Allergen Reactions   Bee Venom Anaphylaxis   Other Itching, Swelling and Other (See Comments)    Feline dander- Runny nose, itchy/puffy eyes     Physical Exam:  Blood pressure 135/88, pulse 92, temperature (!) 97.5 F (36.4 C), resp. rate 20, weight 246 lb 4.8 oz (111.7 kg), SpO2 98 %.    ECOG: 1   General appearance: Alert, awake without any distress. Head: Atraumatic without  abnormalities Oropharynx: Without any thrush or ulcers. Eyes: No scleral icterus. Lymph nodes: No lymphadenopathy noted in the cervical, supraclavicular, or axillary nodes Heart:regular rate and rhythm, without any murmurs or gallops.   Lung: Clear to auscultation without any rhonchi, wheezes or dullness to percussion. Abdomin: Soft, nontender without any shifting dullness or ascites. Musculoskeletal: No clubbing or cyanosis. Neurological: No motor or sensory deficits. Skin: No rashes or lesions.       Lab Results: Lab Results  Component Value Date   WBC 9.1 09/13/2022   HGB 15.6 09/13/2022   HCT 45.3 09/13/2022   MCV 94.8 09/13/2022   PLT 208 09/13/2022     Chemistry      Component Value Date/Time   NA 136 09/13/2022 0852   NA 139 07/25/2022 0935   K 4.7 09/13/2022 0852   CL 102 09/13/2022 0852   CO2 24 09/13/2022 0852   BUN 21 (H) 09/13/2022 0852   BUN 23 07/25/2022 0935   CREATININE 1.27 (H) 09/13/2022 0852      Component Value Date/Time   CALCIUM 9.5 09/13/2022 0852   ALKPHOS 66 09/13/2022 0852   AST 17 09/13/2022 0852   ALT 19 09/13/2022 0852   BILITOT 0.5 09/13/2022 0852         Impression and Plan:  47 year old with:  1.  Kidney cancer diagnosed in June 2023.  He was found to have T3a left clear-cell without any evidence of metastatic disease.Marland Kitchen   His disease status was updated at this time and treatment choices were discussed.  Risks and benefits of continuing this treatment including nausea, fatigue and autoimmune considerations were reiterated.  Plan is to complete a total of 17 cycles or 1 year of therapy.  He has tolerated therapy well and agreeable to proceed.    He will continue to have periodic imaging evaluation under the care of [facility].  His next scan is scheduled tentatively in February 2024.     2.  Antiemetics: No nausea or vomiting reported at this time Compazine is available to him.  3.  IV access: Port-A-Cath has been  deferred at this time with peripheral veins are currently in use.  4.  Immune mediated issues: These include pneumonitis, colitis, hepatitis, hypophysitis among others were reiterated.  5.  Respiratory failure: Related to COPD with stable respiratory status at this time.  No evidence of pneumonitis or complication related to immunotherapy.   6.  Follow-up: He will return in 3 weeks for a follow-up.   30  minutes were dedicated to this encounter.  The time was spent on updating disease status, treatment choices and addressing complications related to cancer and cancer therapy.  Zola Button, MD 1/11/20248:28 AM

## 2022-10-05 ENCOUNTER — Telehealth: Payer: Self-pay

## 2022-10-05 ENCOUNTER — Other Ambulatory Visit (HOSPITAL_COMMUNITY): Payer: Self-pay

## 2022-10-05 LAB — T4: T4, Total: 5.3 ug/dL (ref 4.5–12.0)

## 2022-10-05 NOTE — Telephone Encounter (Signed)
PA request received via CMM for Symbicort 80-4.5MCG/ACT aerosol  Key: [id number]  PA not submitted due to "Refills not covered" message received when doing benefits investigation.  Patient must call insurance per test claim.

## 2022-10-09 ENCOUNTER — Other Ambulatory Visit: Payer: Self-pay | Admitting: Internal Medicine

## 2022-10-09 NOTE — Telephone Encounter (Signed)
Changes Requested   SYMBICORT 80-4.5 MCG/ACT inhaler       Changed from: budesonide-formoterol (SYMBICORT) 80-4.5 MCG/ACT inhaler   Sig: TAKE 2 PUFFS FIRST THING IN AM AND THEN ANOTHER 2 PUFFS ABOUT 12 HOURS LATER.   Disp: 10.2 each    Refills: 12   Start: 10/09/2022   Class: Normal   Non-formulary   Last ordered: 3 months ago (06/27/2022) by Tanda Rockers, MD   Rx #: [id number]   Pharmacy comment: Alternative Requested:MEDICATION NOT COVERED.     [id number] [place] [address]       Dr. WMelvyn Novas please advise on this.

## 2022-10-10 ENCOUNTER — Encounter: Payer: Self-pay | Admitting: Internal Medicine

## 2022-10-11 NOTE — Telephone Encounter (Signed)
This is not the same device   Would prefer dulera 100 or advair 45  with sig  Take 2 puffs first thing in am and then another 2 puffs about 12 hours later.   If this doesn't fly, can do wixella 100 one bid but have pharmacist show how to use or see [place] with it before his his symbicort runs out with formulary in hand if possible   Either way be sure has f/u w/in a month of changing his med to regroup before he purchases more

## 2022-10-11 NOTE — Telephone Encounter (Signed)
Mychart message sent by pt: Russell Burns "[name]"  P [facility] (supporting Tanda Rockers, MD)16 hours ago (4:55 PM)    My insurance carrier is requesting that my prescription for Symbicort be changed to the generic (Wixela 100/50).  If this is not acceptable, you will need to get prior authorization by calling [phone].   Thank you.     Dr. Melvyn Novas, please advise on the switch from Symbicort to [facility].

## 2022-10-23 ENCOUNTER — Encounter: Payer: Self-pay | Admitting: Internal Medicine

## 2022-10-24 DIAGNOSIS — Z20822 Contact with and (suspected) exposure to covid-19: Secondary | ICD-10-CM | POA: Diagnosis not present

## 2022-10-24 DIAGNOSIS — J45901 Unspecified asthma with (acute) exacerbation: Secondary | ICD-10-CM | POA: Diagnosis not present

## 2022-10-24 DIAGNOSIS — J208 Acute bronchitis due to other specified organisms: Secondary | ICD-10-CM | POA: Diagnosis not present

## 2022-10-24 DIAGNOSIS — B9689 Other specified bacterial agents as the cause of diseases classified elsewhere: Secondary | ICD-10-CM | POA: Diagnosis not present

## 2022-10-24 MED ORDER — MOMETASONE FURO-FORMOTEROL FUM 100-5 MCG/ACT IN AERO: 2.0000 | INHALATION_SPRAY | Freq: Two times a day (BID) | RESPIRATORY_TRACT | 5 refills | Status: DC

## 2022-10-25 ENCOUNTER — Inpatient Hospital Stay (HOSPITAL_BASED_OUTPATIENT_CLINIC_OR_DEPARTMENT_OTHER): Payer: BC Managed Care – PPO | Admitting: Internal Medicine

## 2022-10-25 ENCOUNTER — Inpatient Hospital Stay: Payer: BC Managed Care – PPO

## 2022-10-25 ENCOUNTER — Inpatient Hospital Stay: Payer: BC Managed Care – PPO | Attending: Oncology

## 2022-10-25 VITALS — BP 144/86 | HR 100 | Temp 98.3°F | Resp 17 | Wt 243.2 lb

## 2022-10-25 DIAGNOSIS — E1122 Type 2 diabetes mellitus with diabetic chronic kidney disease: Secondary | ICD-10-CM | POA: Insufficient documentation

## 2022-10-25 DIAGNOSIS — C642 Malignant neoplasm of left kidney, except renal pelvis: Secondary | ICD-10-CM

## 2022-10-25 DIAGNOSIS — Z5112 Encounter for antineoplastic immunotherapy: Secondary | ICD-10-CM | POA: Insufficient documentation

## 2022-10-25 DIAGNOSIS — Z79899 Other long term (current) drug therapy: Secondary | ICD-10-CM | POA: Insufficient documentation

## 2022-10-25 DIAGNOSIS — Z905 Acquired absence of kidney: Secondary | ICD-10-CM | POA: Insufficient documentation

## 2022-10-25 DIAGNOSIS — I129 Hypertensive chronic kidney disease with stage 1 through stage 4 chronic kidney disease, or unspecified chronic kidney disease: Secondary | ICD-10-CM | POA: Insufficient documentation

## 2022-10-25 DIAGNOSIS — N189 Chronic kidney disease, unspecified: Secondary | ICD-10-CM | POA: Diagnosis not present

## 2022-10-25 LAB — CBC WITH DIFFERENTIAL (CANCER CENTER ONLY)
Abs Immature Granulocytes: 0.02 10*3/uL (ref 0.00–0.07)
Basophils Absolute: 0.1 10*3/uL (ref 0.0–0.1)
Basophils Relative: 1 %
Eosinophils Absolute: 0.1 10*3/uL (ref 0.0–0.5)
Eosinophils Relative: 1 %
HCT: 47.3 % (ref 39.0–52.0)
Hemoglobin: 16.4 g/dL (ref 13.0–17.0)
Immature Granulocytes: 0 %
Lymphocytes Relative: 19 %
Lymphs Abs: 1.6 10*3/uL (ref 0.7–4.0)
MCH: 33.2 pg (ref 26.0–34.0)
MCHC: 34.7 g/dL (ref 30.0–36.0)
MCV: 95.7 fL (ref 80.0–100.0)
Monocytes Absolute: 0.4 10*3/uL (ref 0.1–1.0)
Monocytes Relative: 5 %
Neutro Abs: 6.4 10*3/uL (ref 1.7–7.7)
Neutrophils Relative %: 74 %
Platelet Count: 243 10*3/uL (ref 150–400)
RBC: 4.94 MIL/uL (ref 4.22–5.81)
RDW: 12.8 % (ref 11.5–15.5)
WBC Count: 8.6 10*3/uL (ref 4.0–10.5)
nRBC: 0 % (ref 0.0–0.2)

## 2022-10-25 LAB — CMP (CANCER CENTER ONLY)
ALT: 16 U/L (ref 0–44)
AST: 12 U/L — ABNORMAL LOW (ref 15–41)
Albumin: 4.6 g/dL (ref 3.5–5.0)
Alkaline Phosphatase: 66 U/L (ref 38–126)
Anion gap: 12 (ref 5–15)
BUN: 14 mg/dL (ref 6–20)
CO2: 22 mmol/L (ref 22–32)
Calcium: 9.7 mg/dL (ref 8.9–10.3)
Chloride: 102 mmol/L (ref 98–111)
Creatinine: 1.18 mg/dL (ref 0.61–1.24)
GFR, Estimated: >60 mL/min (ref >60)
Glucose, Bld: 144 mg/dL — ABNORMAL HIGH (ref 70–99)
Potassium: 4.2 mmol/L (ref 3.5–5.1)
Sodium: 136 mmol/L (ref 135–145)
Total Bilirubin: 0.3 mg/dL (ref 0.3–1.2)
Total Protein: 7.7 g/dL (ref 6.5–8.1)

## 2022-10-25 LAB — TSH: TSH: 5.31 u[IU]/mL — ABNORMAL HIGH (ref 0.350–4.500)

## 2022-10-25 MED ORDER — SODIUM CHLORIDE 0.9 % IV SOLN: Freq: Once | INTRAVENOUS | Status: AC

## 2022-10-25 MED ORDER — SODIUM CHLORIDE 0.9 % IV SOLN
200.0000 mg | Freq: Once | INTRAVENOUS | Status: AC
  Administered 2022-10-25: 200 mg via INTRAVENOUS
  Filled 2022-10-25: qty 8

## 2022-10-25 NOTE — Progress Notes (Signed)
[name]   [phone]  OFFICE PROGRESS NOTE  [name], MD [place] [address]  DIAGNOSIS: Stage Burns (T3a, N0, M0 clear-cell renal cell carcinoma without evidence of metastatic disease diagnosed in June 2023.  PRIOR THERAPY: Status post left laparoscopic radical nephrectomy on 03/12/2022 under the care of Dr. Gloriann Loan and the final pathology showed clear-cell renal cell carcinoma nuclear grade 4 measuring 7.0 cm with tumor extension into the renal vein and renal sinus.  CURRENT THERAPY: Adjuvant immunotherapy with Keytruda 200 Mg IV every 3 weeks status post 6 cycles.  INTERVAL HISTORY: Russell Burns 57 y.o. male came to the clinic today to establish care with me after his primary oncologist Dr. Alen Blew left the practice.  The patient is a very pleasant 57 years old white male diagnosed with a stage Burns clear-cell renal cell carcinoma of the left kidney in June 2023 status post laparoscopic radical left nephrectomy and currently undergoing adjuvant treatment with immunotherapy with Keytruda.  He has been tolerating this treatment well with no concerning adverse effect except for mild nausea 1 day after the treatment.  He has no chest pain, shortness of breath except with exertion with no cough or hemoptysis.  He has no nausea, vomiting, diarrhea or constipation.  He has no headache or visual changes.  The patient has no family history of malignancy but a lot of family history of coronary artery disease and aneurysm.  He is married and has 2 children a son and daughter.  He works and Teacher, early years/pre.  He has no history for smoking but drinks alcohol 3-4 times a week and no history of drug abuse.  MEDICAL HISTORY: Past Medical History:  Diagnosis Date   Allergy    Aortic stenosis    Arthritis    Blood transfusion without reported diagnosis    Cancer ([place])    Chronic kidney disease    Diabetes mellitus  without complication (HCC)    FH: thoracic aneurysm    Hyperlipidemia    Hypertension    Obesity     ALLERGIES:  is allergic to bee venom and other.  MEDICATIONS:  Current Outpatient Medications  Medication Sig Dispense Refill   acetaminophen (TYLENOL) 500 MG tablet Take 1,000 mg by mouth in the morning.     albuterol (PROVENTIL) (2.5 MG/3ML) 0.083% nebulizer solution Take 3 mLs (2.5 mg total) by nebulization every 6 (six) hours as needed for wheezing or shortness of breath. 75 mL 12   albuterol (VENTOLIN HFA) 108 (90 Base) MCG/ACT inhaler Inhale 2 puffs into the lungs every 4 (four) hours.     amLODipine (NORVASC) 10 MG tablet Take 10 mg by mouth daily.     atorvastatin (LIPITOR) 80 MG tablet Take 80 mg by mouth daily.     cetirizine (ZYRTEC) 10 MG tablet Take 10 mg by mouth in the morning.     EPINEPHrine 0.3 mg/0.3 mL IJ SOAJ injection Inject 0.3 mg into the muscle as needed for anaphylaxis.     ezetimibe (ZETIA) 10 MG tablet Take 10 mg by mouth daily.     famotidine (PEPCID) 20 MG tablet One after supper (Patient taking differently: Take 20 mg by mouth daily after supper.) 30 tablet 11   fluticasone (FLONASE) 50 MCG/ACT nasal spray Place 2 sprays into both nostrils in the morning and at bedtime.     JARDIANCE 25 MG TABS tablet Take 25 mg by mouth daily.  losartan (COZAAR) 100 MG tablet Take 100 mg by mouth daily.     metFORMIN (GLUCOPHAGE) 1000 MG tablet Take 1,000 mg by mouth 2 (two) times daily with a meal.     mometasone-formoterol (DULERA) 100-5 MCG/ACT AERO Inhale 2 puffs into the lungs in the morning and at bedtime. 13 g 5   pantoprazole (PROTONIX) 40 MG tablet TAKE 1 TABLET BY MOUTH DAILY. TAKE 30-60 MIN BEFORE FIRST MEAL OF THE DAY 90 tablet 2   prochlorperazine (COMPAZINE) 10 MG tablet TAKE 1 TABLET BY MOUTH EVERY 6 HOURS AS NEEDED FOR NAUSEA OR VOMITING. 30 tablet 0   REPATHA 140 MG/ML SOSY Inject 140 mg into the skin every 14 (fourteen) days.     tamsulosin (FLOMAX) 0.4  MG CAPS capsule Take 1 capsule (0.4 mg total) by mouth daily. 14 capsule 0   tirzepatide (MOUNJARO) 10 MG/0.5ML Pen Inject 10 mg into the skin once a week. (Patient taking differently: Inject 10 mg into the skin every Friday.) 6 mL 3   Current Facility-Administered Medications  Medication Dose Route Frequency Provider Last Rate Last Admin   ipratropium-albuterol (DUONEB) 0.5-2.5 (3) MG/3ML nebulizer solution 3 mL  3 mL Nebulization Q6H Cobb, Karie Schwalbe, NP   3 mL at 09/03/22 1423    SURGICAL HISTORY:  Past Surgical History:  Procedure Laterality Date   CYSTOSCOPY WITH URETEROSCOPY AND STENT PLACEMENT Left 02/12/2022   Procedure: CYSTOSCOPY WITH LEFT RETROGRADE PYELOGRAM, DIAGNOSTIC URETEROSCOPY AND STENT PLACEMENT;  Surgeon: Lucas Mallow, MD;  Location: [facility];  Service: Urology;  Laterality: Left;   LAPAROSCOPIC NEPHRECTOMY, HAND ASSISTED Left 03/12/2022   Procedure: LEFT HAND ASSISTED LAPAROSCOPIC NEPHRECTOMY;  Surgeon: Lucas Mallow, MD;  Location: [facility];  Service: Urology;  Laterality: Left;  NEED 2.5 HRS FOR THIS CASE   TONSILLECTOMY     WISDOM TOOTH EXTRACTION      REVIEW OF SYSTEMS:  Constitutional: negative Eyes: negative Ears, nose, mouth, throat, and face: negative Respiratory: positive for dyspnea on exertion Cardiovascular: negative Gastrointestinal: negative Genitourinary:negative Integument/breast: negative Hematologic/lymphatic: negative Musculoskeletal:negative Neurological: negative Behavioral/Psych: negative Endocrine: negative Allergic/Immunologic: negative   PHYSICAL EXAMINATION: General appearance: alert, cooperative, and no distress Head: Normocephalic, without obvious abnormality, atraumatic Neck: no adenopathy, no JVD, supple, symmetrical, trachea midline, and thyroid not enlarged, symmetric, no tenderness/mass/nodules Lymph nodes: Cervical, supraclavicular, and axillary nodes normal. Resp: clear to auscultation bilaterally Back: symmetric, no  curvature. ROM normal. No CVA tenderness. Cardio: regular rate and rhythm, S1, S2 normal, no murmur, click, rub or gallop GI: soft, non-tender; bowel sounds normal; no masses,  no organomegaly Extremities: extremities normal, atraumatic, no cyanosis or edema Neurologic: Alert and oriented X 3, normal strength and tone. Normal symmetric reflexes. Normal coordination and gait  ECOG PERFORMANCE STATUS: 1 - Symptomatic but completely ambulatory  Blood pressure (!) 144/86, pulse 100, temperature 98.3 F (36.8 C), temperature source Oral, resp. rate 17, weight 243 lb 3.2 oz (110.3 kg), SpO2 96 %.  LABORATORY DATA: Lab Results  Component Value Date   WBC 8.6 10/25/2022   HGB 16.4 10/25/2022   HCT 47.3 10/25/2022   MCV 95.7 10/25/2022   PLT 243 10/25/2022      Chemistry      Component Value Date/Time   NA 136 10/25/2022 0927   NA 139 07/25/2022 0935   K 4.2 10/25/2022 0927   CL 102 10/25/2022 0927   CO2 22 10/25/2022 0927   BUN 14 10/25/2022 0927   BUN 23 07/25/2022 0935   CREATININE  1.18 10/25/2022 0927      Component Value Date/Time   CALCIUM 9.7 10/25/2022 0927   ALKPHOS 66 10/25/2022 0927   AST 12 (L) 10/25/2022 0927   ALT 16 10/25/2022 0927   BILITOT 0.3 10/25/2022 0927       RADIOGRAPHIC STUDIES: No results found.  ASSESSMENT AND PLAN: This is a very pleasant 57 years old white male with Stage Burns (T3a, N0, M0 clear-cell renal cell carcinoma without evidence of metastatic disease diagnosed in June 2023.  He is status post left laparoscopic radical nephrectomy on 03/12/2022 under the care of Dr. Gloriann Loan and the final pathology showed clear-cell renal cell carcinoma nuclear grade 4 measuring 7.0 cm with tumor extension into the renal vein and renal sinus. The patient is currently undergoing treatment with Adjuvant immunotherapy with Keytruda 200 Mg IV every 3 weeks status post 6 cycles. He has been tolerating this treatment well with no concerning adverse effects. I  recommended for the patient to proceed with cycle #7 today as planned. I will see him back for follow-up visit in 3 weeks for evaluation before starting cycle #8. The patient was advised to call immediately if he has any other concerning symptoms in the interval. The patient voices understanding of current disease status and treatment options and is in agreement with the current care plan.  All questions were answered. The patient knows to call the clinic with any problems, questions or concerns. We can certainly see the patient much sooner if necessary.  The total time spent in the appointment was 55 minutes.  Disclaimer: This note was dictated with voice recognition software. Similar sounding words can inadvertently be transcribed and may not be corrected upon review.

## 2022-10-25 NOTE — Patient Instructions (Signed)
[facility]  Discharge Instructions: Thank you for choosing [facility] to provide your oncology and hematology care.   If you have a lab appointment with the [facility], please go directly to the [facility] and check in at the registration area.   Wear comfortable clothing and clothing appropriate for easy access to any Portacath or PICC line.   We strive to give you quality time with your provider. You may need to reschedule your appointment if you arrive late (15 or more minutes).  Arriving late affects you and other patients whose appointments are after yours.  Also, if you miss three or more appointments without notifying the office, you may be dismissed from the clinic at the provider's discretion.      For prescription refill requests, have your pharmacy contact our office and allow 72 hours for refills to be completed.    Today you received the following chemotherapy and/or immunotherapy agents Keytruda.      To help prevent nausea and vomiting after your treatment, we encourage you to take your nausea medication as directed.  BELOW ARE SYMPTOMS THAT SHOULD BE REPORTED IMMEDIATELY: *FEVER GREATER THAN 100.4 F (38 C) OR HIGHER *CHILLS OR SWEATING *NAUSEA AND VOMITING THAT IS NOT CONTROLLED WITH YOUR NAUSEA MEDICATION *UNUSUAL SHORTNESS OF BREATH *UNUSUAL BRUISING OR BLEEDING *URINARY PROBLEMS (pain or burning when urinating, or frequent urination) *BOWEL PROBLEMS (unusual diarrhea, constipation, pain near the anus) TENDERNESS IN MOUTH AND THROAT WITH OR WITHOUT PRESENCE OF ULCERS (sore throat, sores in mouth, or a toothache) UNUSUAL RASH, SWELLING OR PAIN  UNUSUAL VAGINAL DISCHARGE OR ITCHING   Items with * indicate a potential emergency and should be followed up as soon as possible or go to the Emergency Department if any problems should occur.  Please show the CHEMOTHERAPY ALERT CARD or IMMUNOTHERAPY ALERT CARD at  check-in to the Emergency Department and triage nurse.  Should you have questions after your visit or need to cancel or reschedule your appointment, please contact [facility]  Dept: [phone]  and follow the prompts.  Office hours are 8:00 a.m. to 4:30 p.m. Monday - Friday. Please note that voicemails left after 4:00 p.m. may not be returned until the following business day.  We are closed weekends and major holidays. You have access to a nurse at all times for urgent questions. Please call the main number to the clinic Dept: [phone] and follow the prompts.   For any non-urgent questions, you may also contact your provider using MyChart. We now offer e-Visits for anyone 52 and older to request care online for non-urgent symptoms. For details visit [email or web address].   Also download the MyChart app! Go to the app store, search "MyChart", open the app, select [facility], and log in with your MyChart username and password.

## 2022-10-29 ENCOUNTER — Other Ambulatory Visit: Payer: Self-pay | Admitting: Oncology

## 2022-10-29 ENCOUNTER — Ambulatory Visit: Payer: BC Managed Care – PPO | Admitting: Internal Medicine

## 2022-10-29 DIAGNOSIS — J4521 Mild intermittent asthma with (acute) exacerbation: Secondary | ICD-10-CM | POA: Diagnosis not present

## 2022-10-29 DIAGNOSIS — C642 Malignant neoplasm of left kidney, except renal pelvis: Secondary | ICD-10-CM | POA: Diagnosis not present

## 2022-10-30 ENCOUNTER — Telehealth: Payer: Self-pay | Admitting: Internal Medicine

## 2022-10-30 NOTE — Telephone Encounter (Signed)
Called patient regarding upcoming February-April appointments, patient is notified.  

## 2022-11-01 DIAGNOSIS — K5730 Diverticulosis of large intestine without perforation or abscess without bleeding: Secondary | ICD-10-CM | POA: Diagnosis not present

## 2022-11-01 DIAGNOSIS — I359 Nonrheumatic aortic valve disorder, unspecified: Secondary | ICD-10-CM | POA: Diagnosis not present

## 2022-11-01 DIAGNOSIS — I712 Thoracic aortic aneurysm, without rupture, unspecified: Secondary | ICD-10-CM | POA: Diagnosis not present

## 2022-11-01 DIAGNOSIS — Z85528 Personal history of other malignant neoplasm of kidney: Secondary | ICD-10-CM | POA: Diagnosis not present

## 2022-11-01 DIAGNOSIS — J984 Other disorders of lung: Secondary | ICD-10-CM | POA: Diagnosis not present

## 2022-11-01 DIAGNOSIS — J948 Other specified pleural conditions: Secondary | ICD-10-CM | POA: Diagnosis not present

## 2022-11-01 DIAGNOSIS — C642 Malignant neoplasm of left kidney, except renal pelvis: Secondary | ICD-10-CM | POA: Diagnosis not present

## 2022-11-06 DIAGNOSIS — C642 Malignant neoplasm of left kidney, except renal pelvis: Secondary | ICD-10-CM | POA: Diagnosis not present

## 2022-11-13 NOTE — Progress Notes (Unsigned)
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   PRIOR THERAPY: Status post left laparoscopic radical nephrectomy on 03/12/2022 under the care of Dr. Gloriann Loan and the final pathology showed clear-cell renal cell carcinoma nuclear grade 4 measuring 7.0 cm with tumor extension into the renal vein and renal sinus.  CURRENT THERAPY: Adjuvant immunotherapy with Keytruda 200 Mg IV every 3 weeks status post 7 cycles.  Plan is to undergo 1 year of treatment/17 cycles  INTERVAL HISTORY: Russell Burns 57 y.o. male returns to clinic today for follow-up visit accompanied by ***.  The patient is currently undergoing adjuvant immunotherapy with Keytruda IV every 3 weeks.  He is status post 7 cycles and has been tolerating it well except for fatigue and nausea?  He is scheduled to undergo a repeat staging CT scan under the care of [facility] on February ***thousand 24.  The patient denies any recent fever, chills, night sweats, or unexplained weight loss.  Denies any nausea, vomiting, diarrhea, or constipation.  Denies any chest pain, shortness of breath, cough, or hemoptysis.  Denies any rashes or itching.  Denies any new bone pain.  Denies any headache or visual changes.  Denies any abdominal pain or back pain.  Denies any urinary changes.  He is here today for evaluation repeat blood work before undergoing cycle #8.  MEDICAL HISTORY: Past Medical History:  Diagnosis Date   Allergy    Aortic stenosis    Arthritis    Blood transfusion without reported diagnosis    Cancer ([place])    Chronic kidney disease    Diabetes mellitus without complication (HCC)    FH: thoracic aneurysm    Hyperlipidemia    Hypertension    Obesity     ALLERGIES:  is allergic to bee venom and other.  MEDICATIONS:  Current Outpatient  Medications  Medication Sig Dispense Refill   acetaminophen (TYLENOL) 500 MG tablet Take 1,000 mg by mouth in the morning.     albuterol (PROVENTIL) (2.5 MG/3ML) 0.083% nebulizer solution Take 3 mLs (2.5 mg total) by nebulization every 6 (six) hours as needed for wheezing or shortness of breath. 75 mL 12   albuterol (VENTOLIN HFA) 108 (90 Base) MCG/ACT inhaler Inhale 2 puffs into the lungs every 4 (four) hours.     amLODipine (NORVASC) 10 MG tablet Take 10 mg by mouth daily.     atorvastatin (LIPITOR) 80 MG tablet Take 80 mg by mouth daily.     cetirizine (ZYRTEC) 10 MG tablet Take 10 mg by mouth in the morning.     EPINEPHrine 0.3 mg/0.3 mL IJ SOAJ injection Inject 0.3 mg into the muscle as needed for anaphylaxis.     ezetimibe (ZETIA) 10 MG tablet Take 10 mg by mouth daily.     famotidine (PEPCID) 20 MG tablet One after supper (Patient taking differently: Take 20 mg by mouth daily after supper.) 30 tablet 11   fluticasone (FLONASE) 50 MCG/ACT nasal spray Place 2 sprays into both nostrils in the morning and at bedtime.     JARDIANCE 25 MG TABS tablet Take 25 mg by mouth daily.     losartan (COZAAR) 100 MG tablet Take 100 mg by mouth daily.     metFORMIN (GLUCOPHAGE) 1000 MG tablet Take 1,000 mg by mouth 2 (two) times daily with a meal.  mometasone-formoterol (DULERA) 100-5 MCG/ACT AERO Inhale 2 puffs into the lungs in the morning and at bedtime. 13 g 5   pantoprazole (PROTONIX) 40 MG tablet TAKE 1 TABLET BY MOUTH DAILY. TAKE 30-60 MIN BEFORE FIRST MEAL OF THE DAY 90 tablet 2   prochlorperazine (COMPAZINE) 10 MG tablet TAKE 1 TABLET BY MOUTH EVERY 6 HOURS AS NEEDED FOR NAUSEA OR VOMITING. 30 tablet 0   REPATHA 140 MG/ML SOSY Inject 140 mg into the skin every 14 (fourteen) days.     tamsulosin (FLOMAX) 0.4 MG CAPS capsule Take 1 capsule (0.4 mg total) by mouth daily. 14 capsule 0   tirzepatide (MOUNJARO) 10 MG/0.5ML Pen Inject 10 mg into the skin once a week. (Patient taking differently: Inject  10 mg into the skin every Friday.) 6 mL 3   Current Facility-Administered Medications  Medication Dose Route Frequency Provider Last Rate Last Admin   ipratropium-albuterol (DUONEB) 0.5-2.5 (3) MG/3ML nebulizer solution 3 mL  3 mL Nebulization Q6H Cobb, Karie Schwalbe, NP   3 mL at 09/03/22 1423    SURGICAL HISTORY:  Past Surgical History:  Procedure Laterality Date   CYSTOSCOPY WITH URETEROSCOPY AND STENT PLACEMENT Left 02/12/2022   Procedure: CYSTOSCOPY WITH LEFT RETROGRADE PYELOGRAM, DIAGNOSTIC URETEROSCOPY AND STENT PLACEMENT;  Surgeon: Lucas Mallow, MD;  Location: [facility];  Service: Urology;  Laterality: Left;   LAPAROSCOPIC NEPHRECTOMY, HAND ASSISTED Left 03/12/2022   Procedure: LEFT HAND ASSISTED LAPAROSCOPIC NEPHRECTOMY;  Surgeon: Lucas Mallow, MD;  Location: [facility];  Service: Urology;  Laterality: Left;  NEED 2.5 HRS FOR THIS CASE   TONSILLECTOMY     WISDOM TOOTH EXTRACTION      REVIEW OF SYSTEMS:   Review of Systems  Constitutional: Negative for appetite change, chills, fatigue, fever and unexpected weight change.  HENT:   Negative for mouth sores, nosebleeds, sore throat and trouble swallowing.   Eyes: Negative for eye problems and icterus.  Respiratory: Negative for cough, hemoptysis, shortness of breath and wheezing.   Cardiovascular: Negative for chest pain and leg swelling.  Gastrointestinal: Negative for abdominal pain, constipation, diarrhea, nausea and vomiting.  Genitourinary: Negative for bladder incontinence, difficulty urinating, dysuria, frequency and hematuria.   Musculoskeletal: Negative for back pain, gait problem, neck pain and neck stiffness.  Skin: Negative for itching and rash.  Neurological: Negative for dizziness, extremity weakness, gait problem, headaches, light-headedness and seizures.  Hematological: Negative for adenopathy. Does not bruise/bleed easily.  Psychiatric/Behavioral: Negative for confusion, depression and sleep disturbance. The  patient is not nervous/anxious.     PHYSICAL EXAMINATION:  There were no vitals taken for this visit.  ECOG PERFORMANCE STATUS: {CHL ONC ECOG [id number]  Physical Exam  Constitutional: Oriented to person, place, and time and well-developed, well-nourished, and in no distress. No distress.  HENT:  Head: Normocephalic and atraumatic.  Mouth/Throat: Oropharynx is clear and moist. No oropharyngeal exudate.  Eyes: Conjunctivae are normal. Right eye exhibits no discharge. Left eye exhibits no discharge. No scleral icterus.  Neck: Normal range of motion. Neck supple.  Cardiovascular: Normal rate, regular rhythm, normal heart sounds and intact distal pulses.   Pulmonary/Chest: Effort normal and breath sounds normal. No respiratory distress. No wheezes. No rales.  Abdominal: Soft. Bowel sounds are normal. Exhibits no distension and no mass. There is no tenderness.  Musculoskeletal: Normal range of motion. Exhibits no edema.  Lymphadenopathy:    No cervical adenopathy.  Neurological: Alert and oriented to person, place, and time. Exhibits normal muscle tone. Gait normal. Coordination  normal.  Skin: Skin is warm and dry. No rash noted. Not diaphoretic. No erythema. No pallor.  Psychiatric: Mood, memory and judgment normal.  Vitals reviewed.  LABORATORY DATA: Lab Results  Component Value Date   WBC 8.6 10/25/2022   HGB 16.4 10/25/2022   HCT 47.3 10/25/2022   MCV 95.7 10/25/2022   PLT 243 10/25/2022      Chemistry      Component Value Date/Time   NA 136 10/25/2022 0927   NA 139 07/25/2022 0935   K 4.2 10/25/2022 0927   CL 102 10/25/2022 0927   CO2 22 10/25/2022 0927   BUN 14 10/25/2022 0927   BUN 23 07/25/2022 0935   CREATININE 1.18 10/25/2022 0927      Component Value Date/Time   CALCIUM 9.7 10/25/2022 0927   ALKPHOS 66 10/25/2022 0927   AST 12 (L) 10/25/2022 0927   ALT 16 10/25/2022 0927   BILITOT 0.3 10/25/2022 0927       RADIOGRAPHIC STUDIES:  No results  found.   ASSESSMENT/PLAN:  This is a very pleasant 57 year old Caucasian male diagnosed with stage IIIa (T3a, N0, M0) clear-cell renal cell carcinoma.  He does not have any evidence of metastatic disease.  He was diagnosed in June 2023.  He is status post left laparoscopic radical nephrectomy on 03/12/2022 under the care of Dr. Gloriann Loan.  The final pathology showed clear cell renal cell carcinoma nuclear grade 4 measuring 7.0 cm with tumor extension into the renal vein and renal sinus.  The patient is currently undergoing adjuvant immunotherapy with Keytruda 200 mg IV every 3 weeks.  He is status post 7 cycles.  The plan is to undergo 1 year of treatment which is 17 cycles.  The patient is expected to have a restaging CT scan under the care of [facility] later this month on***.   The patient was seen with Dr. Julien Nordmann today.  Labs were reviewed.  Recommend that he proceed with cycle #8 today's schedule.  We will see him back for follow-up visit in 3 weeks for evaluation and repeat blood work before undergoing cycle #9.  The patient was advised to call immediately if he has any concerning symptoms in the interval. The patient voices understanding of current disease status and treatment options and is in agreement with the current care plan. All questions were answered. The patient knows to call the clinic with any problems, questions or concerns. We can certainly see the patient much sooner if necessary   No orders of the defined types were placed in this encounter.    I spent {CHL ONC TIME VISIT - [id number] counseling the patient face to face. The total time spent in the appointment was {CHL ONC TIME VISIT - [id number].  Caylen Yardley L Yousef Huge, PA-C 11/13/22

## 2022-11-15 ENCOUNTER — Inpatient Hospital Stay: Payer: BC Managed Care – PPO

## 2022-11-15 ENCOUNTER — Inpatient Hospital Stay (HOSPITAL_BASED_OUTPATIENT_CLINIC_OR_DEPARTMENT_OTHER): Payer: BC Managed Care – PPO | Admitting: Physician Assistant

## 2022-11-15 VITALS — BP 143/83 | HR 85 | Temp 99.0°F | Resp 20 | Wt 244.8 lb

## 2022-11-15 DIAGNOSIS — Z79899 Other long term (current) drug therapy: Secondary | ICD-10-CM | POA: Diagnosis not present

## 2022-11-15 DIAGNOSIS — N189 Chronic kidney disease, unspecified: Secondary | ICD-10-CM | POA: Diagnosis not present

## 2022-11-15 DIAGNOSIS — C642 Malignant neoplasm of left kidney, except renal pelvis: Secondary | ICD-10-CM

## 2022-11-15 DIAGNOSIS — Z5112 Encounter for antineoplastic immunotherapy: Secondary | ICD-10-CM

## 2022-11-15 DIAGNOSIS — Z905 Acquired absence of kidney: Secondary | ICD-10-CM | POA: Diagnosis not present

## 2022-11-15 DIAGNOSIS — I129 Hypertensive chronic kidney disease with stage 1 through stage 4 chronic kidney disease, or unspecified chronic kidney disease: Secondary | ICD-10-CM | POA: Diagnosis not present

## 2022-11-15 DIAGNOSIS — E1122 Type 2 diabetes mellitus with diabetic chronic kidney disease: Secondary | ICD-10-CM | POA: Diagnosis not present

## 2022-11-15 LAB — CBC WITH DIFFERENTIAL (CANCER CENTER ONLY)
Abs Immature Granulocytes: 0.01 10*3/uL (ref 0.00–0.07)
Basophils Absolute: 0.1 10*3/uL (ref 0.0–0.1)
Basophils Relative: 1 %
Eosinophils Absolute: 0.3 10*3/uL (ref 0.0–0.5)
Eosinophils Relative: 7 %
HCT: 47.5 % (ref 39.0–52.0)
Hemoglobin: 16.5 g/dL (ref 13.0–17.0)
Immature Granulocytes: 0 %
Lymphocytes Relative: 31 %
Lymphs Abs: 1.3 10*3/uL (ref 0.7–4.0)
MCH: 33.4 pg (ref 26.0–34.0)
MCHC: 34.7 g/dL (ref 30.0–36.0)
MCV: 96.2 fL (ref 80.0–100.0)
Monocytes Absolute: 0.4 10*3/uL (ref 0.1–1.0)
Monocytes Relative: 9 %
Neutro Abs: 2.2 10*3/uL (ref 1.7–7.7)
Neutrophils Relative %: 52 %
Platelet Count: 185 10*3/uL (ref 150–400)
RBC: 4.94 MIL/uL (ref 4.22–5.81)
RDW: 12.2 % (ref 11.5–15.5)
WBC Count: 4.3 10*3/uL (ref 4.0–10.5)
nRBC: 0 % (ref 0.0–0.2)

## 2022-11-15 LAB — CMP (CANCER CENTER ONLY)
ALT: 27 U/L (ref 0–44)
AST: 18 U/L (ref 15–41)
Albumin: 4.6 g/dL (ref 3.5–5.0)
Alkaline Phosphatase: 49 U/L (ref 38–126)
Anion gap: 11 (ref 5–15)
BUN: 17 mg/dL (ref 6–20)
CO2: 23 mmol/L (ref 22–32)
Calcium: 9 mg/dL (ref 8.9–10.3)
Chloride: 105 mmol/L (ref 98–111)
Creatinine: 1.19 mg/dL (ref 0.61–1.24)
GFR, Estimated: >60 mL/min (ref >60)
Glucose, Bld: 141 mg/dL — ABNORMAL HIGH (ref 70–99)
Potassium: 4.4 mmol/L (ref 3.5–5.1)
Sodium: 139 mmol/L (ref 135–145)
Total Bilirubin: 0.3 mg/dL (ref 0.3–1.2)
Total Protein: 7.4 g/dL (ref 6.5–8.1)

## 2022-11-15 LAB — TSH: TSH: 9.696 u[IU]/mL — ABNORMAL HIGH (ref 0.350–4.500)

## 2022-11-15 MED ORDER — SODIUM CHLORIDE 0.9 % IV SOLN: Freq: Once | INTRAVENOUS | Status: AC

## 2022-11-15 MED ORDER — SODIUM CHLORIDE 0.9 % IV SOLN
200.0000 mg | Freq: Once | INTRAVENOUS | Status: AC
  Administered 2022-11-15: 200 mg via INTRAVENOUS
  Filled 2022-11-15: qty 8

## 2022-11-15 NOTE — Progress Notes (Signed)
Patient seen by PA today  Vitals are within treatment parameters.  Labs reviewed: and are within treatment parameters.  Per physician team, patient is ready for treatment and there are NO modifications to the treatment plan.

## 2022-11-15 NOTE — Patient Instructions (Signed)
[facility]  Discharge Instructions: Thank you for choosing [facility] to provide your oncology and hematology care.   If you have a lab appointment with the Cancer Center, please go directly to the Cancer Center and check in at the registration area.   Wear comfortable clothing and clothing appropriate for easy access to any Portacath or PICC line.   We strive to give you quality time with your provider. You may need to reschedule your appointment if you arrive late (15 or more minutes).  Arriving late affects you and other patients whose appointments are after yours.  Also, if you miss three or more appointments without notifying the office, you may be dismissed from the clinic at the provider's discretion.      For prescription refill requests, have your pharmacy contact our office and allow 72 hours for refills to be completed.    Today you received the following chemotherapy and/or immunotherapy agents Keytruda      To help prevent nausea and vomiting after your treatment, we encourage you to take your nausea medication as directed.  BELOW ARE SYMPTOMS THAT SHOULD BE REPORTED IMMEDIATELY: *FEVER GREATER THAN 100.4 F (38 C) OR HIGHER *CHILLS OR SWEATING *NAUSEA AND VOMITING THAT IS NOT CONTROLLED WITH YOUR NAUSEA MEDICATION *UNUSUAL SHORTNESS OF BREATH *UNUSUAL BRUISING OR BLEEDING *URINARY PROBLEMS (pain or burning when urinating, or frequent urination) *BOWEL PROBLEMS (unusual diarrhea, constipation, pain near the anus) TENDERNESS IN MOUTH AND THROAT WITH OR WITHOUT PRESENCE OF ULCERS (sore throat, sores in mouth, or a toothache) UNUSUAL RASH, SWELLING OR PAIN  UNUSUAL VAGINAL DISCHARGE OR ITCHING   Items with * indicate a potential emergency and should be followed up as soon as possible or go to the Emergency Department if any problems should occur.  Please show the CHEMOTHERAPY ALERT CARD or IMMUNOTHERAPY ALERT CARD at  check-in to the Emergency Department and triage nurse.  Should you have questions after your visit or need to cancel or reschedule your appointment, please contact [facility]  Dept: [phone]  and follow the prompts.  Office hours are 8:00 a.m. to 4:30 p.m. Monday - Friday. Please note that voicemails left after 4:00 p.m. may not be returned until the following business day.  We are closed weekends and major holidays. You have access to a nurse at all times for urgent questions. Please call the main number to the clinic Dept: [phone] and follow the prompts.   For any non-urgent questions, you may also contact your provider using MyChart. We now offer e-Visits for anyone 18 and older to request care online for non-urgent symptoms. For details visit [email or web address].   Also download the MyChart app! Go to the app store, search "MyChart", open the app, select [facility], and log in with your MyChart username and password.   

## 2022-11-26 ENCOUNTER — Other Ambulatory Visit: Payer: Self-pay

## 2022-11-26 ENCOUNTER — Encounter (HOSPITAL_BASED_OUTPATIENT_CLINIC_OR_DEPARTMENT_OTHER): Payer: Self-pay | Admitting: Emergency Medicine

## 2022-11-26 ENCOUNTER — Encounter: Payer: Self-pay | Admitting: Internal Medicine

## 2022-11-26 ENCOUNTER — Observation Stay (HOSPITAL_BASED_OUTPATIENT_CLINIC_OR_DEPARTMENT_OTHER)
Admission: EM | Admit: 2022-11-26 | Discharge: 2022-11-27 | Disposition: A | Discharge status: Home / Self Care | Payer: BC Managed Care – PPO | Attending: Internal Medicine | Admitting: Internal Medicine

## 2022-11-26 ENCOUNTER — Emergency Department (HOSPITAL_BASED_OUTPATIENT_CLINIC_OR_DEPARTMENT_OTHER): Payer: BC Managed Care – PPO | Admitting: Radiology

## 2022-11-26 ENCOUNTER — Emergency Department (HOSPITAL_BASED_OUTPATIENT_CLINIC_OR_DEPARTMENT_OTHER): Payer: BC Managed Care – PPO

## 2022-11-26 DIAGNOSIS — R059 Cough, unspecified: Secondary | ICD-10-CM | POA: Diagnosis not present

## 2022-11-26 DIAGNOSIS — I7121 Aneurysm of the ascending aorta, without rupture: Secondary | ICD-10-CM | POA: Diagnosis not present

## 2022-11-26 DIAGNOSIS — R062 Wheezing: Secondary | ICD-10-CM

## 2022-11-26 DIAGNOSIS — R0602 Shortness of breath: Secondary | ICD-10-CM | POA: Diagnosis not present

## 2022-11-26 DIAGNOSIS — Z7952 Long term (current) use of systemic steroids: Secondary | ICD-10-CM | POA: Insufficient documentation

## 2022-11-26 DIAGNOSIS — I130 Hypertensive heart and chronic kidney disease with heart failure and stage 1 through stage 4 chronic kidney disease, or unspecified chronic kidney disease: Secondary | ICD-10-CM | POA: Diagnosis not present

## 2022-11-26 DIAGNOSIS — E119 Type 2 diabetes mellitus without complications: Secondary | ICD-10-CM

## 2022-11-26 DIAGNOSIS — Z7984 Long term (current) use of oral hypoglycemic drugs: Secondary | ICD-10-CM | POA: Insufficient documentation

## 2022-11-26 DIAGNOSIS — I712 Thoracic aortic aneurysm, without rupture, unspecified: Secondary | ICD-10-CM | POA: Diagnosis present

## 2022-11-26 DIAGNOSIS — J45901 Unspecified asthma with (acute) exacerbation: Secondary | ICD-10-CM | POA: Insufficient documentation

## 2022-11-26 DIAGNOSIS — Z79899 Other long term (current) drug therapy: Secondary | ICD-10-CM | POA: Insufficient documentation

## 2022-11-26 DIAGNOSIS — Z1152 Encounter for screening for COVID-19: Secondary | ICD-10-CM | POA: Diagnosis not present

## 2022-11-26 DIAGNOSIS — E1169 Type 2 diabetes mellitus with other specified complication: Secondary | ICD-10-CM | POA: Diagnosis not present

## 2022-11-26 DIAGNOSIS — I509 Heart failure, unspecified: Secondary | ICD-10-CM | POA: Insufficient documentation

## 2022-11-26 DIAGNOSIS — J9601 Acute respiratory failure with hypoxia: Secondary | ICD-10-CM | POA: Diagnosis not present

## 2022-11-26 DIAGNOSIS — N189 Chronic kidney disease, unspecified: Secondary | ICD-10-CM | POA: Diagnosis not present

## 2022-11-26 DIAGNOSIS — C642 Malignant neoplasm of left kidney, except renal pelvis: Secondary | ICD-10-CM | POA: Diagnosis present

## 2022-11-26 DIAGNOSIS — J4489 Other specified chronic obstructive pulmonary disease: Secondary | ICD-10-CM | POA: Insufficient documentation

## 2022-11-26 DIAGNOSIS — E1122 Type 2 diabetes mellitus with diabetic chronic kidney disease: Secondary | ICD-10-CM | POA: Diagnosis not present

## 2022-11-26 DIAGNOSIS — Z85528 Personal history of other malignant neoplasm of kidney: Secondary | ICD-10-CM | POA: Insufficient documentation

## 2022-11-26 DIAGNOSIS — I152 Hypertension secondary to endocrine disorders: Secondary | ICD-10-CM | POA: Diagnosis present

## 2022-11-26 DIAGNOSIS — E785 Hyperlipidemia, unspecified: Secondary | ICD-10-CM | POA: Insufficient documentation

## 2022-11-26 DIAGNOSIS — E1159 Type 2 diabetes mellitus with other circulatory complications: Secondary | ICD-10-CM | POA: Diagnosis present

## 2022-11-26 DIAGNOSIS — J9621 Acute and chronic respiratory failure with hypoxia: Secondary | ICD-10-CM | POA: Diagnosis present

## 2022-11-26 LAB — CBC WITH DIFFERENTIAL/PLATELET
Abs Immature Granulocytes: 0.02 10*3/uL (ref 0.00–0.07)
Basophils Absolute: 0.1 10*3/uL (ref 0.0–0.1)
Basophils Relative: 2 %
Eosinophils Absolute: 0.7 10*3/uL — ABNORMAL HIGH (ref 0.0–0.5)
Eosinophils Relative: 11 %
HCT: 47 % (ref 39.0–52.0)
Hemoglobin: 16.1 g/dL (ref 13.0–17.0)
Immature Granulocytes: 0 %
Lymphocytes Relative: 21 %
Lymphs Abs: 1.3 10*3/uL (ref 0.7–4.0)
MCH: 32.4 pg (ref 26.0–34.0)
MCHC: 34.3 g/dL (ref 30.0–36.0)
MCV: 94.6 fL (ref 80.0–100.0)
Monocytes Absolute: 0.5 10*3/uL (ref 0.1–1.0)
Monocytes Relative: 8 %
Neutro Abs: 3.7 10*3/uL (ref 1.7–7.7)
Neutrophils Relative %: 58 %
Platelets: 239 10*3/uL (ref 150–400)
RBC: 4.97 MIL/uL (ref 4.22–5.81)
RDW: 12.1 % (ref 11.5–15.5)
WBC: 6.3 10*3/uL (ref 4.0–10.5)
nRBC: 0 % (ref 0.0–0.2)

## 2022-11-26 LAB — COMPREHENSIVE METABOLIC PANEL
ALT: 20 U/L (ref 0–44)
AST: 18 U/L (ref 15–41)
Albumin: 5 g/dL (ref 3.5–5.0)
Alkaline Phosphatase: 47 U/L (ref 38–126)
Anion gap: 9 (ref 5–15)
BUN: 16 mg/dL (ref 6–20)
CO2: 25 mmol/L (ref 22–32)
Calcium: 9.7 mg/dL (ref 8.9–10.3)
Chloride: 103 mmol/L (ref 98–111)
Creatinine, Ser: 1.28 mg/dL — ABNORMAL HIGH (ref 0.61–1.24)
GFR, Estimated: >60 mL/min (ref >60)
Glucose, Bld: 111 mg/dL — ABNORMAL HIGH (ref 70–99)
Potassium: 4.7 mmol/L (ref 3.5–5.1)
Sodium: 137 mmol/L (ref 135–145)
Total Bilirubin: 0.5 mg/dL (ref 0.3–1.2)
Total Protein: 7.8 g/dL (ref 6.5–8.1)

## 2022-11-26 LAB — I-STAT VENOUS BLOOD GAS, ED
Acid-base deficit: 1 mmol/L (ref 0.0–2.0)
Bicarbonate: 24.9 mmol/L (ref 20.0–28.0)
Calcium, Ion: 1.14 mmol/L — ABNORMAL LOW (ref 1.15–1.40)
HCT: 47 % (ref 39.0–52.0)
Hemoglobin: 16 g/dL (ref 13.0–17.0)
O2 Saturation: 99 %
Potassium: 5 mmol/L (ref 3.5–5.1)
Sodium: 136 mmol/L (ref 135–145)
TCO2: 26 mmol/L (ref 22–32)
pCO2, Ven: 43.1 mmHg — ABNORMAL LOW (ref 44–60)
pH, Ven: 7.369 (ref 7.25–7.43)
pO2, Ven: 156 mmHg — ABNORMAL HIGH (ref 32–45)

## 2022-11-26 LAB — GLUCOSE, CAPILLARY: Glucose-Capillary: 194 mg/dL — ABNORMAL HIGH (ref 70–99)

## 2022-11-26 LAB — RESP PANEL BY RT-PCR (RSV, FLU A&B, COVID)  RVPGX2
Influenza A by PCR: NEGATIVE
Influenza B by PCR: NEGATIVE
Resp Syncytial Virus by PCR: NEGATIVE
SARS Coronavirus 2 by RT PCR: NEGATIVE

## 2022-11-26 LAB — BRAIN NATRIURETIC PEPTIDE: B Natriuretic Peptide: 20.7 pg/mL (ref 0.0–100.0)

## 2022-11-26 LAB — TROPONIN I (HIGH SENSITIVITY)
Troponin I (High Sensitivity): 7 ng/L (ref <18)
Troponin I (High Sensitivity): 6 ng/L (ref <18)

## 2022-11-26 MED ORDER — LOSARTAN POTASSIUM 50 MG PO TABS
100.0000 mg | ORAL_TABLET | Freq: Every day | ORAL | Status: DC
  Administered 2022-11-27: 100 mg via ORAL
  Filled 2022-11-26: qty 2

## 2022-11-26 MED ORDER — INSULIN ASPART 100 UNIT/ML IJ SOLN
0.0000 [IU] | Freq: Three times a day (TID) | INTRAMUSCULAR | Status: DC
  Administered 2022-11-27: 2 [IU] via SUBCUTANEOUS

## 2022-11-26 MED ORDER — ARFORMOTEROL TARTRATE 15 MCG/2ML IN NEBU
15.0000 ug | INHALATION_SOLUTION | Freq: Two times a day (BID) | RESPIRATORY_TRACT | Status: DC
  Administered 2022-11-27: 15 ug via RESPIRATORY_TRACT
  Filled 2022-11-26 (×2): qty 2

## 2022-11-26 MED ORDER — PANTOPRAZOLE SODIUM 40 MG PO TBEC
40.0000 mg | DELAYED_RELEASE_TABLET | Freq: Every day | ORAL | Status: DC
  Administered 2022-11-27: 40 mg via ORAL
  Filled 2022-11-26: qty 1

## 2022-11-26 MED ORDER — ATORVASTATIN CALCIUM 40 MG PO TABS
80.0000 mg | ORAL_TABLET | Freq: Every day | ORAL | Status: DC
  Administered 2022-11-27: 80 mg via ORAL
  Filled 2022-11-26: qty 2

## 2022-11-26 MED ORDER — IPRATROPIUM-ALBUTEROL 0.5-2.5 (3) MG/3ML IN SOLN
3.0000 mL | Freq: Four times a day (QID) | RESPIRATORY_TRACT | Status: DC | PRN
  Administered 2022-11-26: 3 mL via RESPIRATORY_TRACT
  Filled 2022-11-26: qty 3

## 2022-11-26 MED ORDER — ACETAMINOPHEN 650 MG RE SUPP: 650.0000 mg | Freq: Four times a day (QID) | RECTAL | Status: DC | PRN

## 2022-11-26 MED ORDER — ONDANSETRON HCL 4 MG/2ML IJ SOLN: 4.0000 mg | Freq: Four times a day (QID) | INTRAMUSCULAR | Status: DC | PRN

## 2022-11-26 MED ORDER — ALBUTEROL SULFATE (2.5 MG/3ML) 0.083% IN NEBU: 2.5000 mg | INHALATION_SOLUTION | RESPIRATORY_TRACT | Status: DC | PRN

## 2022-11-26 MED ORDER — TAMSULOSIN HCL 0.4 MG PO CAPS
0.4000 mg | ORAL_CAPSULE | Freq: Every day | ORAL | Status: DC
  Administered 2022-11-27: 0.4 mg via ORAL
  Filled 2022-11-26: qty 1

## 2022-11-26 MED ORDER — ACETAMINOPHEN 325 MG PO TABS: 650.0000 mg | ORAL_TABLET | Freq: Four times a day (QID) | ORAL | Status: DC | PRN

## 2022-11-26 MED ORDER — ALBUTEROL SULFATE HFA 108 (90 BASE) MCG/ACT IN AERS: 2.0000 | INHALATION_SPRAY | RESPIRATORY_TRACT | Status: DC | PRN

## 2022-11-26 MED ORDER — GUAIFENESIN ER 600 MG PO TB12
1200.0000 mg | ORAL_TABLET | Freq: Two times a day (BID) | ORAL | Status: DC
  Administered 2022-11-26 – 2022-11-27 (×2): 1200 mg via ORAL
  Filled 2022-11-26 (×2): qty 2

## 2022-11-26 MED ORDER — LORATADINE 10 MG PO TABS
10.0000 mg | ORAL_TABLET | Freq: Every day | ORAL | Status: DC
  Administered 2022-11-27: 10 mg via ORAL
  Filled 2022-11-26: qty 1

## 2022-11-26 MED ORDER — SENNOSIDES-DOCUSATE SODIUM 8.6-50 MG PO TABS: 1.0000 | ORAL_TABLET | Freq: Every evening | ORAL | Status: DC | PRN

## 2022-11-26 MED ORDER — METHYLPREDNISOLONE SODIUM SUCC 40 MG IJ SOLR
40.0000 mg | Freq: Two times a day (BID) | INTRAMUSCULAR | Status: DC
  Administered 2022-11-27: 40 mg via INTRAVENOUS
  Filled 2022-11-26: qty 1

## 2022-11-26 MED ORDER — ONDANSETRON HCL 4 MG PO TABS: 4.0000 mg | ORAL_TABLET | Freq: Four times a day (QID) | ORAL | Status: DC | PRN

## 2022-11-26 MED ORDER — METHYLPREDNISOLONE SODIUM SUCC 125 MG IJ SOLR
80.0000 mg | Freq: Once | INTRAMUSCULAR | Status: AC
  Administered 2022-11-26: 80 mg via INTRAVENOUS
  Filled 2022-11-26: qty 2

## 2022-11-26 MED ORDER — BUDESONIDE 0.25 MG/2ML IN SUSP
0.2500 mg | Freq: Two times a day (BID) | RESPIRATORY_TRACT | Status: DC
  Administered 2022-11-26 – 2022-11-27 (×2): 0.25 mg via RESPIRATORY_TRACT
  Filled 2022-11-26 (×2): qty 2

## 2022-11-26 MED ORDER — EZETIMIBE 10 MG PO TABS
10.0000 mg | ORAL_TABLET | Freq: Every day | ORAL | Status: DC
  Administered 2022-11-27: 10 mg via ORAL
  Filled 2022-11-26: qty 1

## 2022-11-26 MED ORDER — IOHEXOL 350 MG/ML SOLN
100.0000 mL | Freq: Once | INTRAVENOUS | Status: AC | PRN
  Administered 2022-11-26: 75 mL via INTRAVENOUS

## 2022-11-26 MED ORDER — AMLODIPINE BESYLATE 10 MG PO TABS
10.0000 mg | ORAL_TABLET | Freq: Every day | ORAL | Status: DC
  Administered 2022-11-27: 10 mg via ORAL
  Filled 2022-11-26: qty 1

## 2022-11-26 MED ORDER — IPRATROPIUM-ALBUTEROL 0.5-2.5 (3) MG/3ML IN SOLN
3.0000 mL | Freq: Once | RESPIRATORY_TRACT | Status: AC
  Administered 2022-11-26: 3 mL via RESPIRATORY_TRACT
  Filled 2022-11-26: qty 3

## 2022-11-26 MED ORDER — IPRATROPIUM-ALBUTEROL 0.5-2.5 (3) MG/3ML IN SOLN
3.0000 mL | RESPIRATORY_TRACT | Status: AC
  Administered 2022-11-26 (×2): 3 mL via RESPIRATORY_TRACT
  Filled 2022-11-26: qty 9

## 2022-11-26 MED ORDER — ENOXAPARIN SODIUM 40 MG/0.4ML IJ SOSY
40.0000 mg | PREFILLED_SYRINGE | INTRAMUSCULAR | Status: DC
  Administered 2022-11-26: 40 mg via SUBCUTANEOUS
  Filled 2022-11-26: qty 0.4

## 2022-11-26 NOTE — Assessment & Plan Note (Addendum)
Continue atorvastatin and Zetia. 

## 2022-11-26 NOTE — Assessment & Plan Note (Signed)
CTA chest shows stable ascending thoracic aortic aneurysm measuring 4 and half centimeters.  Patient is established with CT surgery Dr. Roxan Hockey and cardiology Dr. Marlou Porch for follow-up.

## 2022-11-26 NOTE — ED Triage Notes (Addendum)
Pt here for shob and cough that started Friday. Pt has been using his home nebulizer approx every 4 hours and using his rescue inhaler. Pt reports hx of the same last April and in October, never found out what was wrong. Pt reports home O2 was 84% today. Pt reports heaviness on chest, cannot breathing laying down or back. Pt tachypnic w/ labored breathing in triage. Hx of CA, on immunotherapy, pt denies hx of copd/chf

## 2022-11-26 NOTE — Assessment & Plan Note (Signed)
Follows with oncology, Dr. Alen Blew, and urology Dr. Gloriann Loan for [facility] s/p left nephrectomy 02/2022.  Currently on adjuvant Keytruda, next treatment scheduled for 3/14.

## 2022-11-26 NOTE — H&P (Signed)
History and Physical    Russell Burns [id number] DOB: [birth year] DOA: 11/26/2022  PCP: Russell Baton, MD  Patient coming from: Home  I have personally briefly reviewed patient's old medical records in [place]  Chief Complaint: Shortness of breath, hypoxia  HPI: Russell Burns is a 57 y.o. male with medical history significant for renal cell carcinoma s/p left nephrectomy on adjuvant Keytruda, asthmatic bronchitis, T2DM, HTN, HLD, BPH who presented to the ED for evaluation of shortness of breath and hypoxia at home.  Patient states over the last 2 days he has been having progressive shortness of breath at rest and with exertion.  He has had cough which has been nonproductive but does have significant chest congestion.  He has a pulse ox at home and has seen his SpO2 did drop to 88-89% with low of 84%.  He says normally he is walking up to 21,000 steps per day but due to symptoms he is having to stop frequently to rest due to shortness of breath and is down to 8000 steps a day.  He has been using his albuterol nebulizer every 4 hours as well as rescue inhaler twice in between.  He has been using Mucinex, flutter valve, incentive spirometer and attempt to relieve his chest congestion.  He has been using his Dulera twice a day.  He has had similar episodes twice last year and at 1 point there was concern for Keytruda associated pneumonitis.  He follows with pulmonology, Dr. Melvyn Burns, and has been diagnosed with asthmatic bronchitis.  He did require supplemental O2 at home for a short period of time last year but was able to be weaned off.  He denies any fevers, chills, diaphoresis, nausea, vomiting, abdominal pain.  [place] ED Course  Labs/Imaging on admission: I have personally reviewed following labs and imaging studies.  Initial vitals showed BP 135/94, pulse 87, RR 32, temp 97.8 F, SpO2 94% on room air.  SpO2 dropped to 88-89% on room air and he was placed on 2 L  O2 via [place].  Labs show WBC 6.3, hemoglobin 16.1, platelets 239,000, sodium 137, potassium 4.7, bicarb 25, BUN 16, creatinine 1.28, serum glucose 111, LFTs within normal limits, troponin negative x 2, BNP 20.7.  COVID, influenza, RSV negative.  VBG showed pH 7.369, pCO2 43.1, pO2 156.  2 view chest x-ray negative for acute cardiopulmonary disease.  CTA chest showed stable ascending thoracic aortic aneurysm measuring 4.5 cm.  No acute process in the chest.  Stable mildly enlarged right hilar lymph node is seen and felt to be reactive.  Patient was given IV Solu-Medrol 80 mg, DuoNeb x 2.  The hospitalist service was consulted to admit for further evaluation and management.  Review of Systems: All systems reviewed and are negative except as documented in history of present illness above.   Past Medical History:  Diagnosis Date   Allergy    Aortic stenosis    Arthritis    Blood transfusion without reported diagnosis    Cancer ([place])    Chronic kidney disease    Diabetes mellitus without complication (HCC)    FH: thoracic aneurysm    Hyperlipidemia    Hypertension    Obesity     Past Surgical History:  Procedure Laterality Date   CYSTOSCOPY WITH URETEROSCOPY AND STENT PLACEMENT Left 02/12/2022   Procedure: CYSTOSCOPY WITH LEFT RETROGRADE PYELOGRAM, DIAGNOSTIC URETEROSCOPY AND STENT PLACEMENT;  Surgeon: Lucas Mallow, MD;  Location: [facility];  Service: Urology;  Laterality: Left;   LAPAROSCOPIC NEPHRECTOMY, HAND ASSISTED Left 03/12/2022   Procedure: LEFT HAND ASSISTED LAPAROSCOPIC NEPHRECTOMY;  Surgeon: Lucas Mallow, MD;  Location: [facility];  Service: Urology;  Laterality: Left;  NEED 2.5 HRS FOR THIS CASE   TONSILLECTOMY     WISDOM TOOTH EXTRACTION      Social History:  reports that he has never smoked. His smokeless tobacco use includes chew. He reports current alcohol use. He reports that he does not use drugs.  Allergies  Allergen Reactions   Bee Venom Anaphylaxis    Other Itching, Swelling and Other (See Comments)    Feline dander- Runny nose, itchy/puffy eyes    Family History  Problem Relation Age of Onset   CAD Other    Diabetes Mellitus II Other    Hypertension Other    Heart failure Other    Hypertension Mother    Heart attack Father        76, 6 at both ages   Diabetes Mellitus II Father        insulin dependent   Aneurysm Maternal Uncle        aortic   Heart attack Paternal Uncle 66       minor   Hypertension Paternal Uncle    Heart attack Maternal Grandmother 82       massive   Aneurysm Maternal Grandfather        abdominal   Heart attack Paternal Grandfather 55   Aneurysm Cousin 44       aortic   Colon polyps Neg Hx    Esophageal cancer Neg Hx    Rectal cancer Neg Hx    Stomach cancer Neg Hx      Prior to Admission medications   Medication Sig Start Date End Date Taking? Authorizing Provider  acetaminophen (TYLENOL) 500 MG tablet Take 1,000 mg by mouth in the morning.    [provider]  albuterol (PROVENTIL) (2.5 MG/3ML) 0.083% nebulizer solution Take 3 mLs (2.5 mg total) by nebulization every 6 (six) hours as needed for wheezing or shortness of breath. 09/03/22   Cobb, Karie Schwalbe, NP  albuterol (VENTOLIN HFA) 108 (90 Base) MCG/ACT inhaler Inhale 2 puffs into the lungs every 4 (four) hours. 12/11/21   [provider]  amLODipine (NORVASC) 10 MG tablet Take 10 mg by mouth daily. 08/01/20   [provider]  atorvastatin (LIPITOR) 80 MG tablet Take 80 mg by mouth daily.    [provider]  cetirizine (ZYRTEC) 10 MG tablet Take 10 mg by mouth in the morning.    [provider]  EPINEPHrine 0.3 mg/0.3 mL IJ SOAJ injection Inject 0.3 mg into the muscle as needed for anaphylaxis.    [provider]  ezetimibe (ZETIA) 10 MG tablet Take 10 mg by mouth daily. 08/01/20   [provider]  famotidine (PEPCID) 20 MG tablet One after supper Patient taking differently: Take 20  mg by mouth daily after supper. 06/11/22   Tanda Rockers, MD  fluticasone (FLONASE) 50 MCG/ACT nasal spray Place 2 sprays into both nostrils in the morning and at bedtime.    [provider]  JARDIANCE 25 MG TABS tablet Take 25 mg by mouth daily. 07/10/20   [provider]  losartan (COZAAR) 100 MG tablet Take 100 mg by mouth daily.    [provider]  metFORMIN (GLUCOPHAGE) 1000 MG tablet Take 1,000 mg by mouth 2 (two) times daily with a meal.    [provider]  mometasone-formoterol (DULERA) 100-5 MCG/ACT AERO Inhale 2 puffs into the lungs in the morning and at bedtime. 10/24/22   Tanda Rockers, MD  pantoprazole (PROTONIX) 40 MG tablet TAKE 1 TABLET BY MOUTH DAILY. TAKE 30-60 MIN BEFORE FIRST MEAL OF THE DAY 09/05/22   Tanda Rockers, MD  prochlorperazine (COMPAZINE) 10 MG tablet TAKE 1 TABLET BY MOUTH EVERY 6 HOURS AS NEEDED FOR NAUSEA OR VOMITING. 10/01/22   Wyatt Portela, MD  REPATHA 140 MG/ML SOSY Inject 140 mg into the skin every 14 (fourteen) days.    [provider]  tamsulosin (FLOMAX) 0.4 MG CAPS capsule Take 1 capsule (0.4 mg total) by mouth daily. 02/12/22   Lucas Mallow, MD  tirzepatide [facility]) 10 MG/0.5ML Pen Inject 10 mg into the skin once a week. Patient taking differently: Inject 10 mg into the skin every Friday. 11/14/21       Physical Exam: Vitals:   11/26/22 1955 11/26/22 2029 11/26/22 2042 11/26/22 2223  BP: (!) 153/88 (!) 145/85    Pulse: 97 97    Resp: (!) 22 18    Temp:  97.7 F (36.5 C)    TempSrc:  Oral    SpO2: 97% 94%  96%  Weight:   107 kg   Height:   '5\' 10"'$  (1.778 m)    Constitutional: Sitting up in bed, NAD, calm Eyes: PERRL, lids and conjunctivae normal ENMT: Mucous membranes are moist. Posterior pharynx clear of any exudate or lesions.Normal dentition.  Neck: normal, supple, no masses. Respiratory: Faint end expiratory wheezing.  Slightly increased respiratory effort. No accessory muscle use.   Cardiovascular: Regular rate and rhythm, no murmurs / rubs / gallops. No extremity edema. 2+ pedal pulses. Abdomen: no tenderness, no masses palpated. Musculoskeletal: no clubbing / cyanosis. No joint deformity upper and lower extremities. Good ROM, no contractures. Normal muscle tone.  Skin: no rashes, lesions, ulcers. No induration Neurologic: Sensation intact. Strength 5/5 in all 4.  Psychiatric: Normal judgment and insight. Alert and oriented x 3. Normal mood.   EKG: Personally reviewed. Sinus rhythm, rate 85, no acute ischemic changes.  Assessment/Plan Principal Problem:   Acute respiratory failure with hypoxia (HCC) Active Problems:   Type 2 diabetes mellitus ([place])   Hypertension associated with diabetes ([place])   Thoracic aortic aneurysm (HCC)   Hyperlipidemia associated with type 2 diabetes mellitus ([place])   Malignant neoplasm of left kidney (HCC)   Asthmatic bronchitis with acute exacerbation   Zebulen [name] Burns is a 57 y.o. male with medical history significant for renal cell carcinoma s/p left nephrectomy on adjuvant Keytruda, asthmatic bronchitis, T2DM, HTN, HLD, BPH who is admitted with acute hypoxic respiratory failure likely due to acute exacerbation of asthmatic bronchitis.  Assessment and Plan: * Acute respiratory failure with hypoxia (HCC) Acute asthmatic bronchitis exacerbation Presenting with persistent dyspnea, nonproductive cough, and hypoxia with SpO2 as low as 84% at home.  Not relieved with home nebulizers, rescue inhalers.  CTA chest is negative for PE, pneumonia, infiltrates, or evidence of inflammatory lung disease.  Doubt Keytruda associated pulmonary toxicity at this time. -IV Solu-Medrol 40 mg twice daily -Start Brovana/Pulmicort, DuoNebs as needed -Continue Mucinex, incentive spirometer, flutter valve -Continue supplemental O2 prn and wean off as able  Malignant neoplasm of left kidney ([place]) Follows with oncology, Dr. Alen Blew, and urology Dr. Gloriann Loan for [facility]  s/p left nephrectomy 02/2022.  Currently on adjuvant Keytruda, next treatment scheduled for 3/14.  Hyperlipidemia associated with type 2 diabetes  mellitus ([place]) Continue atorvastatin and Zetia.  Thoracic aortic aneurysm (HCC) CTA chest shows stable ascending thoracic aortic aneurysm measuring 4 and half centimeters.  Patient is established with CT surgery Dr. Roxan Hockey and cardiology Dr. Marlou Porch for follow-up.  Hypertension associated with diabetes ([place]) Continue amlodipine and losartan.  Type 2 diabetes mellitus ([place]) Hold home meds and placed on SSI.  DVT prophylaxis: enoxaparin (LOVENOX) injection 40 mg Start: 11/26/22 2230 Code Status: Full code, confirmed with patient on admission Family Communication: Discussed with patient, he has discussed with family Disposition Plan: From home and likely discharge to home pending clinical progress Consults called: None Severity of Illness: The appropriate patient status for this patient is OBSERVATION. Observation status is judged to be reasonable and necessary in order to provide the required intensity of service to ensure the patient's safety. The patient's presenting symptoms, physical exam findings, and initial radiographic and laboratory data in the context of their medical condition is felt to place them at decreased risk for further clinical deterioration. Furthermore, it is anticipated that the patient will be medically stable for discharge from the hospital within 2 midnights of admission.   Zada Finders MD [facility]  If 7PM-7AM, please contact night-coverage [email or web address]  11/26/2022, 10:39 PM

## 2022-11-26 NOTE — Hospital Course (Addendum)
Russell Burns is a 57 y.o. male with medical history significant for renal cell carcinoma s/p left nephrectomy on adjuvant Keytruda, asthmatic bronchitis, T2DM, HTN, HLD, BPH who is admitted with acute hypoxic respiratory failure likely due to acute exacerbation of asthmatic bronchitis.

## 2022-11-26 NOTE — Assessment & Plan Note (Signed)
Hold home meds and placed on SSI. 

## 2022-11-26 NOTE — Assessment & Plan Note (Addendum)
Acute asthmatic bronchitis exacerbation Presenting with persistent dyspnea, nonproductive cough, and hypoxia with SpO2 as low as 84% at home.  Not relieved with home nebulizers, rescue inhalers.  CTA chest is negative for PE, pneumonia, infiltrates, or evidence of inflammatory lung disease.  Doubt Keytruda associated pulmonary toxicity at this time. -IV Solu-Medrol 40 mg twice daily -Start Brovana/Pulmicort, DuoNebs as needed -Continue Mucinex, incentive spirometer, flutter valve -Continue supplemental O2 prn and wean off as able

## 2022-11-26 NOTE — ED Provider Notes (Signed)
[facility] Provider Note   CSN: [id number] Arrival date & time: 11/26/22  1117     History  Chief Complaint  Patient presents with   Shortness of Breath   Cough    Russell Burns is a 57 y.o. male.   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  The patient has been seen outpatient with pulmonology regarding his symptoms.  He had a negative workup for alpha-1 antitrypsin deficiency and has no history of asthma or COPD.  He has not had outpatient pulmonary function testing.  He been utilizing his home albuterol nebulizer without relief.  His oxygen saturations were not staying above 94 long at all at home.  He denies any chest pain, endorses persistent tachypnea and a sensation of chest heaviness.  Has difficulty laying flat.  Home Medications Prior to Admission medications   Medication Sig Start Date End Date Taking? Authorizing Provider  acetaminophen (TYLENOL) 500 MG tablet Take 1,000 mg by mouth in the morning.    [provider]  albuterol (PROVENTIL) (2.5 MG/3ML) 0.083% nebulizer solution Take 3 mLs (2.5 mg total) by nebulization every 6 (six) hours as needed for wheezing or shortness of breath. 09/03/22   Cobb, Karie Schwalbe, NP  albuterol (VENTOLIN HFA) 108 (90 Base) MCG/ACT inhaler Inhale 2 puffs into the lungs every 4 (four) hours. 12/11/21   [provider]  amLODipine (NORVASC) 10 MG tablet Take 10 mg by mouth daily. 08/01/20   [provider]  atorvastatin (LIPITOR) 80 MG tablet Take 80 mg by mouth daily.    [provider]  cetirizine (ZYRTEC) 10 MG tablet  Take 10 mg by mouth in the morning.    [provider]  EPINEPHrine 0.3 mg/0.3 mL IJ SOAJ injection Inject 0.3 mg into the muscle as needed for anaphylaxis.    [provider]  ezetimibe (ZETIA) 10 MG tablet Take 10 mg by mouth daily. 08/01/20   [provider]  famotidine (PEPCID) 20 MG tablet One after supper Patient taking differently: Take 20 mg by mouth daily after supper. 06/11/22   Tanda Rockers, MD  fluticasone (FLONASE) 50 MCG/ACT nasal spray Place 2 sprays into both nostrils in the morning and at bedtime.    [provider]  JARDIANCE 25 MG TABS tablet Take 25 mg by mouth daily. 07/10/20   [provider]  losartan (COZAAR) 100 MG tablet Take 100 mg by mouth daily.    [provider]  metFORMIN (GLUCOPHAGE) 1000 MG tablet Take 1,000 mg by mouth 2 (two) times daily with a meal.    [provider]  mometasone-formoterol (DULERA) 100-5 MCG/ACT AERO Inhale 2 puffs into the lungs in the morning and at bedtime. 10/24/22   Tanda Rockers, MD  pantoprazole (PROTONIX) 40 MG tablet TAKE 1 TABLET BY MOUTH DAILY. TAKE 30-60 MIN BEFORE FIRST MEAL OF THE DAY 09/05/22   Tanda Rockers, MD  prochlorperazine (COMPAZINE) 10 MG tablet TAKE 1 TABLET BY MOUTH EVERY 6 HOURS AS NEEDED FOR NAUSEA OR VOMITING. 10/01/22   Wyatt Portela, MD  REPATHA 140 MG/ML SOSY Inject 140 mg into the skin every 14 (fourteen) days.  [provider]  tamsulosin (FLOMAX) 0.4 MG CAPS capsule Take 1 capsule (0.4 mg total) by mouth daily. 02/12/22   Lucas Mallow, MD  tirzepatide [facility]) 10 MG/0.5ML Pen Inject 10 mg into the skin once a week. Patient taking differently: Inject 10 mg into the skin every Friday. 11/14/21         Allergies    Bee venom and Other    Review of Systems   Review of Systems  Respiratory:  Positive for cough, chest tightness and shortness of breath.   All other systems reviewed and are negative.   Physical Exam Updated  Vital Signs BP 127/82   Pulse 81   Temp 97.9 F (36.6 C) (Oral)   Resp 11   SpO2 96%  Physical Exam Vitals and nursing note reviewed.  Constitutional:      General: He is not in acute distress.    Appearance: He is well-developed.  HENT:     Head: Normocephalic and atraumatic.  Eyes:     Conjunctiva/sclera: Conjunctivae normal.  Cardiovascular:     Rate and Rhythm: Normal rate and regular rhythm.     Heart sounds: No murmur heard. Pulmonary:     Effort: Pulmonary effort is normal. No respiratory distress.     Breath sounds: Examination of the right-upper field reveals wheezing. Examination of the left-upper field reveals wheezing. Examination of the right-middle field reveals wheezing. Examination of the left-middle field reveals wheezing. Examination of the right-lower field reveals wheezing. Examination of the left-lower field reveals wheezing. Wheezing present.  Abdominal:     Palpations: Abdomen is soft.     Tenderness: There is no abdominal tenderness.  Musculoskeletal:        General: No swelling.     Cervical back: Neck supple.  Skin:    General: Skin is warm and dry.     Capillary Refill: Capillary refill takes less than 2 seconds.  Neurological:     Mental Status: He is alert.  Psychiatric:        Mood and Affect: Mood normal.     ED Results / Procedures / Treatments   Labs (all labs ordered are listed, but only abnormal results are displayed) Labs Reviewed  CBC WITH DIFFERENTIAL/PLATELET - Abnormal; Notable for the following components:      Result Value   Eosinophils Absolute 0.7 (*)    All other components within normal limits  COMPREHENSIVE METABOLIC PANEL - Abnormal; Notable for the following components:   Glucose, Bld 111 (*)    Creatinine, Ser 1.28 (*)    All other components within normal limits  I-STAT VENOUS BLOOD GAS, ED - Abnormal; Notable for the following components:   pCO2, Ven 43.1 (*)    pO2, Ven 156 (*)    Calcium, Ion 1.14 (*)    All  other components within normal limits  RESP PANEL BY RT-PCR (RSV, FLU A&B, COVID)  [id number]  BRAIN NATRIURETIC PEPTIDE  BLOOD GAS, VENOUS  TROPONIN I (HIGH SENSITIVITY)  TROPONIN I (HIGH SENSITIVITY)    EKG EKG Interpretation  Date/Time:  Monday November 26 2022 11:55:39 EST Ventricular Rate:  85 PR Interval:  172 QRS Duration: 98 QT Interval:  344 QTC Calculation: 409 R Axis:   -19 Text Interpretation: Normal sinus rhythm Possible Anterior infarct , age undetermined Abnormal ECG When compared with ECG of 14-Jun-2022 14:05, PREVIOUS ECG IS PRESENT Confirmed by Regan Lemming ([id number]) on 11/26/2022 12:12:35 PM  Radiology CT ANGIO CHEST AORTA W/CM &/OR WO/CM  Result Date: 11/26/2022 CLINICAL DATA:  Aortic aneurysm suspected, dyspnea. EXAM: CT ANGIOGRAPHY CHEST WITH CONTRAST TECHNIQUE: Multidetector CT imaging of the chest was performed using the standard protocol during bolus administration of intravenous contrast. Multiplanar CT image reconstructions and MIPs were obtained to evaluate the vascular anatomy. RADIATION DOSE REDUCTION: This exam was performed according to the departmental dose-optimization program which includes automated exposure control, adjustment of the mA and/or kV according to patient size and/or use of iterative reconstruction technique. CONTRAST:  87m OMNIPAQUE IOHEXOL 350 MG/ML SOLN COMPARISON:  CT angiogram chest 07/05/2022 FINDINGS: Cardiovascular: Preferential opacification of the thoracic aorta. Ascending aorta measures up to 4.5 cm, unchanged. There is no evidence for dissection. There are atherosclerotic calcifications of the aorta. Normal heart size. No pericardial effusion. Mediastinum/Nodes: Mildly enlarged right hilar lymph node measures 11 mm. This is unchanged. No new enlarged lymph nodes are identified. Visualized esophagus and thyroid gland are within normal limits. Lungs/Pleura: A few calcified nodules are unchanged. No new pulmonary nodules. No focal lung infiltrate,  pleural effusion or pneumothorax. Upper Abdomen: No acute abnormality. Musculoskeletal: No chest wall abnormality. No acute or significant osseous findings. Review of the MIP images confirms the above findings. IMPRESSION: 1. Stable ascending thoracic aortic aneurysm measuring 4.5 cm. Ascending thoracic aortic aneurysm. Recommend semi-annual imaging followup by CTA or MRA and referral to cardiothoracic surgery if not already obtained. This recommendation follows 2010 ACCF/AHA/AATS/ACR/ASA/SCA/SCAI/SIR/STS/SVM Guidelines for the Diagnosis and Management of Patients With Thoracic Aortic Disease. Circulation. 2010; [id number] Aortic aneurysm NOS (ICD10-I71.9) 2. No acute process in the chest. 3. Stable mildly enlarged right hilar lymph node, likely reactive. Aortic Atherosclerosis (ICD10-I70.0). Electronically Signed   By: ARonney AstersM.D.   On: 11/26/2022 15:10   DG Chest 2 View  Result Date: 11/26/2022 CLINICAL DATA:  Shortness of breath, cough EXAM: CHEST - 2 VIEW COMPARISON:  Radiograph 09/03/2022 FINDINGS: The heart size and mediastinal contours are within normal limits.No focal airspace disease. No pleural effusion or pneumothorax.Thoracic spondylosis. No acute osseous abnormality. IMPRESSION: No evidence of acute cardiopulmonary disease. Electronically Signed   By: JMaurine SimmeringM.D.   On: 11/26/2022 12:48    Procedures Procedures    Medications Ordered in ED Medications  albuterol (VENTOLIN HFA) 108 (90 Base) MCG/ACT inhaler 2 puff (has no administration in time range)  ipratropium-albuterol (DUONEB) 0.5-2.5 (3) MG/3ML nebulizer solution 3 mL (3 mLs Nebulization Not Given 11/26/22 1612)  ipratropium-albuterol (DUONEB) 0.5-2.5 (3) MG/3ML nebulizer solution 3 mL (3 mLs Nebulization Given 11/26/22 1200)  methylPREDNISolone sodium succinate (SOLU-MEDROL) 125 mg/2 mL injection 80 mg (80 mg Intravenous Given 11/26/22 1512)  iohexol (OMNIPAQUE) 350 MG/ML injection 100 mL (75 mLs Intravenous Contrast  Given 11/26/22 1454)    ED Course/ Medical Decision Making/ A&P                             Medical Decision Making Amount and/or Complexity of Data Reviewed Labs: ordered. Radiology: ordered.  Risk Prescription drug management. Decision regarding hospitalization.    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  The patient has been seen outpatient with pulmonology regarding his symptoms.  He had a negative workup for alpha-1 antitrypsin deficiency and has no history of asthma or COPD.  He has not had outpatient  pulmonary function testing.  He been utilizing his home albuterol nebulizer without relief.  His oxygen saturations were not staying above 94 long at all at home.  He denies any chest pain, endorses persistent tachypnea and a sensation of chest heaviness.  Has difficulty laying flat.  On arrival, the patient was afebrile, not tachycardic or tachypneic, BP 135/94, saturating 89% to 80% on room air, subsequently placed on 2 L O2 via nasal cannula.  Sinus rhythm noted on cardiac telemetry.  Differential diagnosis includes undiagnosed COPD with exacerbation, interstitial pneumonitis, possibly induced by his Keytruda versus other etiology, viral illness, pericarditis, PE.  The patient's initial EKG revealed sinus rhythm, ventricular rate 85, no acute ischemic changes noted.  Chest x-ray revealed no pneumothorax or pneumonia, no other acute cardiopulmonary process.  Given the patient's chest discomfort with some pressure noted, a CT angiogram was performed to evaluate the patient's thoracic aneurysm and revealed the following: IMPRESSION:  1. Stable ascending thoracic aortic aneurysm measuring 4.5 cm.  Ascending thoracic aortic aneurysm. Recommend  semi-annual imaging  followup by CTA or MRA and referral to cardiothoracic surgery if not  already obtained. This recommendation follows 2010  ACCF/AHA/AATS/ACR/ASA/SCA/SCAI/SIR/STS/SVM Guidelines for the  Diagnosis and Management of Patients With Thoracic Aortic Disease.  Circulation. 2010; [id number]. Aortic aneurysm NOS (ICD10-I71.9)  2. No acute process in the chest.  3. Stable mildly enlarged right hilar lymph node, likely reactive.    Aortic Atherosclerosis (ICD10-I70.0).    Given the patient's wheezing, he was administered IV Solu-Medrol, DuoNebs x 3 with persistent wheezing and hypoxia and had an oxygen requirement.  He remained stable on 2 L O2 via nasal cannula.  Laboratory evaluation significant for a VBG without a significant acidosis or alkalosis, pH of 7.37, pCO2 43, bicarbonate of 25, COVID-19, influenza, RSV PCR testing negative, troponins negative, BMP unremarkable, CBC without a leukocytosis or anemia, CMP generally unremarkable, creatinine at baseline.  I reached out to pulmonology given the patient's history however they do not provide consultation while patients are at [facility].  Hospitalist medicine was consulted for admission for the patient's acute hypoxic respiratory failure and wheezing associated illness.  Plan will be for potential pulmonology consultation inpatient.  Patient was subsequently admitted in stable condition.   Final Clinical Impression(s) / ED Diagnoses Final diagnoses:  Acute respiratory failure with hypoxia (HCC)  Diffuse wheezing    Rx / DC Orders ED Discharge Orders     None         Regan Lemming, MD 11/26/22 2012

## 2022-11-26 NOTE — Assessment & Plan Note (Signed)
Continue amlodipine and losartan 

## 2022-11-27 DIAGNOSIS — J9601 Acute respiratory failure with hypoxia: Secondary | ICD-10-CM | POA: Diagnosis not present

## 2022-11-27 LAB — BASIC METABOLIC PANEL
Anion gap: 11 (ref 5–15)
BUN: 23 mg/dL — ABNORMAL HIGH (ref 6–20)
CO2: 21 mmol/L — ABNORMAL LOW (ref 22–32)
Calcium: 9.3 mg/dL (ref 8.9–10.3)
Chloride: 102 mmol/L (ref 98–111)
Creatinine, Ser: 1.27 mg/dL — ABNORMAL HIGH (ref 0.61–1.24)
GFR, Estimated: >60 mL/min (ref >60)
Glucose, Bld: 180 mg/dL — ABNORMAL HIGH (ref 70–99)
Potassium: 5 mmol/L (ref 3.5–5.1)
Sodium: 134 mmol/L — ABNORMAL LOW (ref 135–145)

## 2022-11-27 LAB — CBC
HCT: 50.1 % (ref 39.0–52.0)
Hemoglobin: 16.5 g/dL (ref 13.0–17.0)
MCH: 32.7 pg (ref 26.0–34.0)
MCHC: 32.9 g/dL (ref 30.0–36.0)
MCV: 99.2 fL (ref 80.0–100.0)
Platelets: 240 10*3/uL (ref 150–400)
RBC: 5.05 MIL/uL (ref 4.22–5.81)
RDW: 12 % (ref 11.5–15.5)
WBC: 8.6 10*3/uL (ref 4.0–10.5)
nRBC: 0 % (ref 0.0–0.2)

## 2022-11-27 LAB — GLUCOSE, CAPILLARY: Glucose-Capillary: 165 mg/dL — ABNORMAL HIGH (ref 70–99)

## 2022-11-27 MED ORDER — PREDNISONE 20 MG PO TABS: 40.0000 mg | ORAL_TABLET | Freq: Every day | ORAL | 0 refills | Status: AC

## 2022-11-27 NOTE — Progress Notes (Addendum)
Patient Saturations on Room Air at Rest = 94%  Patient Saturations on [employer] while Ambulating = 94%  Patient Saturations on 0 Liters of oxygen while Ambulating = 95%    Patient maintains saturations of 93% and above while ambulating.

## 2022-11-27 NOTE — Telephone Encounter (Signed)
Will forward to Dr. Melvyn Novas as Juluis Rainier. Pt admitted.

## 2022-11-27 NOTE — Discharge Summary (Signed)
Physician Discharge Summary  Russell Burns III [id number] DOB: [birth year] DOA: 11/26/2022  PCP: [name], MD  Admit date: 11/26/2022 Discharge date: 11/27/2022  Admitted From: Home Disposition: Home  Recommendations for Outpatient Follow-up:  Follow up with PCP in 1-2 weeks   Discharge Condition: Stable CODE STATUS: Full code Diet recommendation: Regular diet  Discharge summary: 57 year old gentleman with history of renal cell carcinoma status post left nephrectomy on adjuvant Keytruda, asthmatic bronchitis, type 2 diabetes on metformin, hypertension hyperlipidemia presented to the ER with shortness of breath and low oxygen at home.  At home pulse oximetry his oxygen was 88-89%.  He had symptoms of cough and wheezing for last 3 days.  He has been using increasing frequency of albuterol without improvement.  Came to the emergency room.  94% on room air.  Dropped to 89% on walking.  Electrolytes normal.  WBC normal.  COVID/influenza/RSV negative.  Chest x-ray normal.  CT angiogram of the chest with a stable ascending thoracic aortic aneurysm, no acute parenchymal process.  Due to significant symptoms of wheezing and chest tightness, he was admitted and observed in the hospital.  Acute exacerbation of asthmatic bronchitis: Treated with IV steroids and frequent nebulizer therapy.  Symptoms all improved today.  On room air and taking labs.  No evidence of superadded infection.  With adequate clinical improvement is able to go home. Patient is already on [facility] that he will continue. Patient is on albuterol inhalers and nebulizers that he will continue he may have to use more frequently for next few days. Will provide prednisone 40 mg daily for 5 days then stop. Resume all home medications.  Stable for discharge.   Discharge Diagnoses:  Principal Problem:   Acute respiratory failure with hypoxia (HCC) Active Problems:   Type 2 diabetes mellitus ([place])   Hypertension associated with  diabetes ([place])   Thoracic aortic aneurysm (HCC)   Hyperlipidemia associated with type 2 diabetes mellitus ([place])   Malignant neoplasm of left kidney ([place])   Asthmatic bronchitis with acute exacerbation    Discharge Instructions  Discharge Instructions     Diet general   Complete by: As directed    Increase activity slowly   Complete by: As directed       Allergies as of 11/27/2022       Reactions   Bee Venom Anaphylaxis   Other Itching, Swelling, Other (See Comments)   Feline dander- Runny nose, itchy/puffy eyes        Medication List     TAKE these medications    acetaminophen 500 MG tablet Commonly known as: TYLENOL Take 1,000 mg by mouth in the morning.   albuterol 108 (90 Base) MCG/ACT inhaler Commonly known as: VENTOLIN HFA Inhale 2 puffs into the lungs every 6 (six) hours as needed for wheezing or shortness of breath.   albuterol (2.5 MG/3ML) 0.083% nebulizer solution Commonly known as: PROVENTIL Take 3 mLs (2.5 mg total) by nebulization every 6 (six) hours as needed for wheezing or shortness of breath.   amLODipine 10 MG tablet Commonly known as: NORVASC Take 10 mg by mouth daily.   atorvastatin 80 MG tablet Commonly known as: LIPITOR Take 80 mg by mouth daily.   cetirizine 10 MG tablet Commonly known as: ZYRTEC Take 10 mg by mouth 2 (two) times daily.   EPINEPHrine 0.3 mg/0.3 mL Soaj injection Commonly known as: EPI-PEN Inject 0.3 mg into the muscle as needed for anaphylaxis.   ezetimibe 10 MG tablet Commonly known as: ZETIA Take  10 mg by mouth daily.   famotidine 20 MG tablet Commonly known as: Pepcid One after supper What changed:  how much to take how to take this when to take this additional instructions   fluticasone 50 MCG/ACT nasal spray Commonly known as: FLONASE Place 2 sprays into both nostrils in the morning and at bedtime.   ipratropium-albuterol 0.5-2.5 (3) MG/3ML Soln Commonly known as: DUONEB TAKE 3 MLS BY  NEBULIZATION EVERY 6 (SIX) HOURS AS NEEDED (WHEEZING AND SHORTNESS OF BREATH).   Jardiance 25 MG Tabs tablet Generic drug: empagliflozin Take 25 mg by mouth daily.   losartan 100 MG tablet Commonly known as: COZAAR Take 100 mg by mouth daily.   metFORMIN 1000 MG tablet Commonly known as: GLUCOPHAGE Take 1,000 mg by mouth 2 (two) times daily with a meal.   mometasone-formoterol 100-5 MCG/ACT Aero Commonly known as: DULERA Inhale 2 puffs into the lungs in the morning and at bedtime.   Mounjaro 10 MG/0.5ML Pen Generic drug: tirzepatide Inject 10 mg into the skin once a week. What changed: when to take this   pantoprazole 40 MG tablet Commonly known as: PROTONIX TAKE 1 TABLET BY MOUTH DAILY. TAKE 30-60 MIN BEFORE FIRST MEAL OF THE DAY What changed: See the new instructions.   predniSONE 20 MG tablet Commonly known as: DELTASONE Take 2 tablets (40 mg total) by mouth daily for 4 days.   prochlorperazine 10 MG tablet Commonly known as: COMPAZINE TAKE 1 TABLET BY MOUTH EVERY 6 HOURS AS NEEDED FOR NAUSEA OR VOMITING.   Repatha 140 MG/ML Sosy Generic drug: Evolocumab Inject 140 mg into the skin every 14 (fourteen) days.   tamsulosin 0.4 MG Caps capsule Commonly known as: FLOMAX Take 1 capsule (0.4 mg total) by mouth daily.        Allergies  Allergen Reactions   Bee Venom Anaphylaxis   Other Itching, Swelling and Other (See Comments)    Feline dander- Runny nose, itchy/puffy eyes    Consultations: None   Procedures/Studies: CT ANGIO CHEST AORTA W/CM &/OR WO/CM  Result Date: 11/26/2022 CLINICAL DATA:  Aortic aneurysm suspected, dyspnea. EXAM: CT ANGIOGRAPHY CHEST WITH CONTRAST TECHNIQUE: Multidetector CT imaging of the chest was performed using the standard protocol during bolus administration of intravenous contrast. Multiplanar CT image reconstructions and MIPs were obtained to evaluate the vascular anatomy. RADIATION DOSE REDUCTION: This exam was performed  according to the departmental dose-optimization program which includes automated exposure control, adjustment of the mA and/or kV according to patient size and/or use of iterative reconstruction technique. CONTRAST:  71m OMNIPAQUE IOHEXOL 350 MG/ML SOLN COMPARISON:  CT angiogram chest 07/05/2022 FINDINGS: Cardiovascular: Preferential opacification of the thoracic aorta. Ascending aorta measures up to 4.5 cm, unchanged. There is no evidence for dissection. There are atherosclerotic calcifications of the aorta. Normal heart size. No pericardial effusion. Mediastinum/Nodes: Mildly enlarged right hilar lymph node measures 11 mm. This is unchanged. No new enlarged lymph nodes are identified. Visualized esophagus and thyroid gland are within normal limits. Lungs/Pleura: A few calcified nodules are unchanged. No new pulmonary nodules. No focal lung infiltrate, pleural effusion or pneumothorax. Upper Abdomen: No acute abnormality. Musculoskeletal: No chest wall abnormality. No acute or significant osseous findings. Review of the MIP images confirms the above findings. IMPRESSION: 1. Stable ascending thoracic aortic aneurysm measuring 4.5 cm. Ascending thoracic aortic aneurysm. Recommend semi-annual imaging followup by CTA or MRA and referral to cardiothoracic surgery if not already obtained. This recommendation follows 2010 ACCF/AHA/AATS/ACR/ASA/SCA/SCAI/SIR/STS/SVM Guidelines for the Diagnosis and Management of  Patients With Thoracic Aortic Disease. Circulation. 2010; 121ML:4928372. Aortic aneurysm NOS (ICD10-I71.9) 2. No acute process in the chest. 3. Stable mildly enlarged right hilar lymph node, likely reactive. Aortic Atherosclerosis (ICD10-I70.0). Electronically Signed   By: Ronney Asters M.D.   On: 11/26/2022 15:10   DG Chest 2 View  Result Date: 11/26/2022 CLINICAL DATA:  Shortness of breath, cough EXAM: CHEST - 2 VIEW COMPARISON:  Radiograph 09/03/2022 FINDINGS: The heart size and mediastinal contours are  within normal limits.No focal airspace disease. No pleural effusion or pneumothorax.Thoracic spondylosis. No acute osseous abnormality. IMPRESSION: No evidence of acute cardiopulmonary disease. Electronically Signed   By: Maurine Simmering M.D.   On: 11/26/2022 12:48   (Echo, Carotid, EGD, Colonoscopy, ERCP)    Subjective: Patient seen in the morning rounds.  Denied any complaints.  After dose of steroids and admission to the hospital most of his symptoms are improved.  He was walked around in the hallway without dropping his oxygen sats rations.  He feels well without any chest tightness or wheezing.  Eager to go home.   Discharge Exam: Vitals:   11/27/22 0745 11/27/22 0831  BP:  (!) 159/83  Pulse:    Resp:    Temp:    SpO2: 95%    Vitals:   11/27/22 0453 11/27/22 0742 11/27/22 0745 11/27/22 0831  BP: 139/89 (!) 152/105  (!) 159/83  Pulse: 88 83    Resp: 20 18    Temp: 98 F (36.7 C) 97.7 F (36.5 C)    TempSrc: Oral Oral    SpO2: 94% 95% 95%   Weight:      Height:        General: Pt is alert, awake, not in acute distress Cardiovascular: RRR, S1/S2 +, no rubs, no gallops Respiratory: CTA bilaterally, no wheezing, no rhonchi Abdominal: Soft, NT, ND, bowel sounds + Extremities: no edema, no cyanosis    The results of significant diagnostics from this hospitalization (including imaging, microbiology, ancillary and laboratory) are listed below for reference.     Microbiology: Recent Results (from the past 240 hour(s))  Resp panel by RT-PCR (RSV, Flu A&B, Covid) Anterior Nasal Swab     Status: None   Collection Time: 11/26/22  1:23 PM   Specimen: Anterior Nasal Swab  Result Value Ref Range Status   SARS Coronavirus 2 by RT PCR NEGATIVE NEGATIVE Final    Comment: (NOTE) SARS-CoV-2 target nucleic acids are NOT DETECTED.  The SARS-CoV-2 RNA is generally detectable in upper respiratory specimens during the acute phase of infection. The lowest concentration of SARS-CoV-2 viral  copies this assay can detect is 138 copies/mL. A negative result does not preclude SARS-Cov-2 infection and should not be used as the sole basis for treatment or other patient management decisions. A negative result may occur with  improper specimen collection/handling, submission of specimen other than nasopharyngeal swab, presence of viral mutation(s) within the areas targeted by this assay, and inadequate number of viral copies(<138 copies/mL). A negative result must be combined with clinical observations, patient history, and epidemiological information. The expected result is Negative.  Fact Sheet for Patients:  EntrepreneurPulse.com.au  Fact Sheet for Healthcare Providers:  IncredibleEmployment.be  This test is no t yet approved or cleared by the [place] FDA and  has been authorized for detection and/or diagnosis of SARS-CoV-2 by FDA under an Emergency Use Authorization (EUA). This EUA will remain  in effect (meaning this test can be used) for the duration of the COVID-19 declaration under  Section 564(b)(1) of the Act, 21 U.S.C.section 360bbb-3(b)(1), unless the authorization is terminated  or revoked sooner.       Influenza A by PCR NEGATIVE NEGATIVE Final   Influenza B by PCR NEGATIVE NEGATIVE Final    Comment: (NOTE) The Xpert Xpress SARS-CoV-2/FLU/RSV plus assay is intended as an aid in the diagnosis of influenza from Nasopharyngeal swab specimens and should not be used as a sole basis for treatment. Nasal washings and aspirates are unacceptable for Xpert Xpress SARS-CoV-2/FLU/RSV testing.  Fact Sheet for Patients: EntrepreneurPulse.com.au  Fact Sheet for Healthcare Providers: IncredibleEmployment.be  This test is not yet approved or cleared by the [place] FDA and has been authorized for detection and/or diagnosis of SARS-CoV-2 by FDA under an Emergency Use Authorization (EUA). This  EUA will remain in effect (meaning this test can be used) for the duration of the COVID-19 declaration under Section 564(b)(1) of the Act, 21 U.S.C. section 360bbb-3(b)(1), unless the authorization is terminated or revoked.     Resp Syncytial Virus by PCR NEGATIVE NEGATIVE Final    Comment: (NOTE) Fact Sheet for Patients: EntrepreneurPulse.com.au  Fact Sheet for Healthcare Providers: IncredibleEmployment.be  This test is not yet approved or cleared by the [place] FDA and has been authorized for detection and/or diagnosis of SARS-CoV-2 by FDA under an Emergency Use Authorization (EUA). This EUA will remain in effect (meaning this test can be used) for the duration of the COVID-19 declaration under Section 564(b)(1) of the Act, 21 U.S.C. section 360bbb-3(b)(1), unless the authorization is terminated or revoked.  Performed at [facility], [address] [place], [place] [address]      Labs: BNP (last 3 results) Recent Labs    01/01/22 1447 06/14/22 1440 11/26/22 1205  BNP 19.5 27.9 [id number]   Basic Metabolic Panel: Recent Labs  Lab 11/26/22 1205 11/26/22 1633 11/27/22 0723  NA 137 136 134*  K 4.7 5.0 5.0  CL 103  --  102  CO2 25  --  21*  GLUCOSE 111*  --  180*  BUN 16  --  23*  CREATININE 1.28*  --  1.27*  CALCIUM 9.7  --  9.3   Liver Function Tests: Recent Labs  Lab 11/26/22 1205  AST 18  ALT 20  ALKPHOS 47  BILITOT 0.5  PROT 7.8  ALBUMIN 5.0   No results for input(s): "LIPASE", "AMYLASE" in the last 168 hours. No results for input(s): "AMMONIA" in the last 168 hours. CBC: Recent Labs  Lab 11/26/22 1205 11/26/22 1633 11/27/22 0723  WBC 6.3  --  8.6  NEUTROABS 3.7  --   --   HGB 16.1 16.0 16.5  HCT 47.0 47.0 50.1  MCV 94.6  --  99.2  PLT 239  --  240   Cardiac Enzymes: No results for input(s): "CKTOTAL", "CKMB", "CKMBINDEX", "TROPONINI" in the last 168 hours. BNP: Invalid  input(s): "POCBNP" CBG: Recent Labs  Lab 11/26/22 2155 11/27/22 0744  GLUCAP 194* 165*   D-Dimer No results for input(s): "DDIMER" in the last 72 hours. Hgb A1c No results for input(s): "HGBA1C" in the last 72 hours. Lipid Profile No results for input(s): "CHOL", "HDL", "LDLCALC", "TRIG", "CHOLHDL", "LDLDIRECT" in the last 72 hours. Thyroid function studies No results for input(s): "TSH", "T4TOTAL", "T3FREE", "THYROIDAB" in the last 72 hours.  Invalid input(s): "FREET3" Anemia work up No results for input(s): "VITAMINB12", "FOLATE", "FERRITIN", "TIBC", "IRON", "RETICCTPCT" in the last 72 hours. Urinalysis    Component Value Date/Time   COLORURINE STRAW (  A) 06/15/2022 0456   APPEARANCEUR CLEAR 06/15/2022 0456   LABSPEC 1.030 06/15/2022 0456   PHURINE 5.0 06/15/2022 0456   GLUCOSEU >=500 (A) 06/15/2022 0456   HGBUR NEGATIVE 06/15/2022 0456   BILIRUBINUR NEGATIVE 06/15/2022 0456   KETONESUR NEGATIVE 06/15/2022 0456   PROTEINUR NEGATIVE 06/15/2022 0456   NITRITE NEGATIVE 06/15/2022 0456   LEUKOCYTESUR NEGATIVE 06/15/2022 0456   Sepsis Labs Recent Labs  Lab 11/26/22 1205 11/27/22 0723  WBC 6.3 8.6   Microbiology Recent Results (from the past 240 hour(s))  Resp panel by RT-PCR (RSV, Flu A&B, Covid) Anterior Nasal Swab     Status: None   Collection Time: 11/26/22  1:23 PM   Specimen: Anterior Nasal Swab  Result Value Ref Range Status   SARS Coronavirus 2 by RT PCR NEGATIVE NEGATIVE Final    Comment: (NOTE) SARS-CoV-2 target nucleic acids are NOT DETECTED.  The SARS-CoV-2 RNA is generally detectable in upper respiratory specimens during the acute phase of infection. The lowest concentration of SARS-CoV-2 viral copies this assay can detect is 138 copies/mL. A negative result does not preclude SARS-Cov-2 infection and should not be used as the sole basis for treatment or other patient management decisions. A negative result may occur with  improper specimen  collection/handling, submission of specimen other than nasopharyngeal swab, presence of viral mutation(s) within the areas targeted by this assay, and inadequate number of viral copies(<138 copies/mL). A negative result must be combined with clinical observations, patient history, and epidemiological information. The expected result is Negative.  Fact Sheet for Patients:  EntrepreneurPulse.com.au  Fact Sheet for Healthcare Providers:  IncredibleEmployment.be  This test is no t yet approved or cleared by the [place] FDA and  has been authorized for detection and/or diagnosis of SARS-CoV-2 by FDA under an Emergency Use Authorization (EUA). This EUA will remain  in effect (meaning this test can be used) for the duration of the COVID-19 declaration under Section 564(b)(1) of the Act, 21 U.S.C.section 360bbb-3(b)(1), unless the authorization is terminated  or revoked sooner.       Influenza A by PCR NEGATIVE NEGATIVE Final   Influenza B by PCR NEGATIVE NEGATIVE Final    Comment: (NOTE) The Xpert Xpress SARS-CoV-2/FLU/RSV plus assay is intended as an aid in the diagnosis of influenza from Nasopharyngeal swab specimens and should not be used as a sole basis for treatment. Nasal washings and aspirates are unacceptable for Xpert Xpress SARS-CoV-2/FLU/RSV testing.  Fact Sheet for Patients: EntrepreneurPulse.com.au  Fact Sheet for Healthcare Providers: IncredibleEmployment.be  This test is not yet approved or cleared by the [place] FDA and has been authorized for detection and/or diagnosis of SARS-CoV-2 by FDA under an Emergency Use Authorization (EUA). This EUA will remain in effect (meaning this test can be used) for the duration of the COVID-19 declaration under Section 564(b)(1) of the Act, 21 U.S.C. section 360bbb-3(b)(1), unless the authorization is terminated or revoked.     Resp Syncytial  Virus by PCR NEGATIVE NEGATIVE Final    Comment: (NOTE) Fact Sheet for Patients: EntrepreneurPulse.com.au  Fact Sheet for Healthcare Providers: IncredibleEmployment.be  This test is not yet approved or cleared by the [place] FDA and has been authorized for detection and/or diagnosis of SARS-CoV-2 by FDA under an Emergency Use Authorization (EUA). This EUA will remain in effect (meaning this test can be used) for the duration of the COVID-19 declaration under Section 564(b)(1) of the Act, 21 U.S.C. section 360bbb-3(b)(1), unless the authorization is terminated or revoked.  Performed at [facility], [address] [place], [place] [address]      Time coordinating discharge:  32 minutes  SIGNED:   Barb Merino, MD  [facility] 11/27/2022, 11:46 AM

## 2022-11-29 NOTE — Telephone Encounter (Signed)
I can see him tomorrow to bridge until his scheduled appt with Dr Melvyn Novas

## 2022-11-30 ENCOUNTER — Ambulatory Visit (INDEPENDENT_AMBULATORY_CARE_PROVIDER_SITE_OTHER): Payer: BC Managed Care – PPO | Admitting: Internal Medicine

## 2022-11-30 ENCOUNTER — Encounter: Payer: Self-pay | Admitting: Internal Medicine

## 2022-11-30 VITALS — BP 128/82 | HR 92 | Ht 70.0 in | Wt 244.0 lb

## 2022-11-30 DIAGNOSIS — J984 Other disorders of lung: Secondary | ICD-10-CM | POA: Diagnosis not present

## 2022-11-30 DIAGNOSIS — J9601 Acute respiratory failure with hypoxia: Secondary | ICD-10-CM

## 2022-11-30 DIAGNOSIS — J45909 Unspecified asthma, uncomplicated: Secondary | ICD-10-CM

## 2022-11-30 MED ORDER — BREZTRI AEROSPHERE 160-9-4.8 MCG/ACT IN AERO: 2.0000 | INHALATION_SPRAY | Freq: Two times a day (BID) | RESPIRATORY_TRACT | 0 refills | Status: DC

## 2022-11-30 NOTE — Assessment & Plan Note (Signed)
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 Plan- He will finish prednisone burst from recent hosp. Samples of Breztri instead of Dulera. Continue nebs. Schedule PFT. Ordered home O2. He will keep pending appointment with Dr Melvyn Novas.. Anticipate probable disability and consideration for transplant once renal cancer resolbved.

## 2022-11-30 NOTE — Assessment & Plan Note (Signed)
Hypoxic on arrival on prednisone, qualifying for home O2. Plan- ordering home O2 2L portable and continuous

## 2022-11-30 NOTE — Patient Instructions (Addendum)
Order- schedule PFT dx asthmatic bronchitis  Order- sample x 2 Breztri inhaler    inhale 2 puffs then rinse mouth, twice daily Try this instead of Dulera. When samples run out, go back to [facility] for comparison.  Order- new DME( used [facility] before), new home O2 continuous 2L     dx acute respiratory failure with hypoxia  Finish the prednisone. If you aren't doing well first of week, contact Dr wert for advice and plan on keeping appointment with him.  Order- work note- seen to day for necessary care. May return to work Monday, 3/11 using oxygen.

## 2022-11-30 NOTE — Progress Notes (Signed)
11/30/22- 20 yoM patient of Dr Melvyn Novas with Asthmatic Bronchitis, DOE, Chronic Cough, hx Acute Hypoxic Resp Failure, complicated by HTN?, dCHF,GERD, DM2, L Renal Cell Cancer/ Nephrectomy, Ascending Aortic Aneurysm, ,  Hosp 3/4-11/27/22- acute exacerb AsBr. Responded to nebs, steroids. >> pred 40 d x 5 d, continue Dulera, albuterol hfa, nebs. Arrival O2 sat 11/30/22- 87%. Placed here on O2 3l -----Was in the ER for low 02. Dry cough, sob, dizziness. Body weight today 244 lbs Work exposure potentially to toluene diisocyanate. He describes use of mask and states measured air levels are way below OSHA requirement. He has worked at this job for 5 years.  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  He had thought there might be episodic exacerbation of his respiratory status related to [facility] treatments for his cancer, but he has decided there is no association.  Inhalers and nebulizer are helping a little briefly.  He has not noticed benefit so far from prednisone course begun 3 days ago with 2 more days pending.  He has not recognized other triggering exposures.  He has his own pulse oximeter and notices if he sits quietly oxygen saturation drops.  Today he was hypoxic on arrival on room air and qualifies for home oxygen which is being started.  He had had home oxygen in the past and was able to work at his desk with portable tanks.  He reports no heart problems.  He is alternating albuterol with DuoNeb every 4 hours by nebulizer at home. CXR  11/26/22 The heart size and mediastinal contours are within normal limits.No focal airspace disease. No pleural effusion or pneumothorax.Thoracic spondylosis. No acute osseous abnormality. IMPRESSION: No evidence of acute cardiopulmonary disease. CTa chest aorta 11/26/22- IMPRESSION: 1. Stable ascending thoracic aortic aneurysm measuring 4.5 cm. Ascending thoracic aortic aneurysm. Recommend semi-annual imaging followup by CTA or MRA and referral to  cardiothoracic surgery if not already obtained. This recommendation follows 2010 ACCF/AHA/AATS/ACR/ASA/SCA/SCAI/SIR/STS/SVM Guidelines for the Diagnosis and Management of Patients With Thoracic Aortic Disease. Circulation. 2010; 121ML:[id number]. Aortic aneurysm NOS (ICD10-I71.9) 2. No acute process in the chest. 3. Stable mildly enlarged right hilar lymph node, likely reactive. Aortic Atherosclerosis (ICD10-I70.0).  Prior to Admission medications   Medication Sig Start Date End Date Taking? Authorizing Provider  acetaminophen (TYLENOL) 500 MG tablet Take 1,000 mg by mouth in the morning.   Yes [provider]  albuterol (PROVENTIL) (2.5 MG/3ML) 0.083% nebulizer solution Take 3 mLs (2.5 mg total) by nebulization every 6 (six) hours as needed for wheezing or shortness of breath. 09/03/22  Yes Cobb, Karie Schwalbe, NP  albuterol (VENTOLIN HFA) 108 (90 Base) MCG/ACT inhaler Inhale 2 puffs into the lungs every 6 (six) hours as needed for wheezing or shortness of breath. 12/11/21  Yes [provider]  amLODipine (NORVASC) 10 MG tablet Take 10 mg by mouth daily. 08/01/20  Yes [provider]  atorvastatin (LIPITOR) 80 MG tablet Take 80 mg by mouth daily.   Yes [provider]  Budeson-Glycopyrrol-Formoterol (BREZTRI AEROSPHERE) 160-9-4.8 MCG/ACT AERO Inhale 2 puffs into the lungs in the morning and at bedtime. 11/30/22  Yes Denasia Venn, Tarri Fuller D, MD  cetirizine (ZYRTEC) 10 MG tablet Take 10 mg by mouth 2 (two) times daily.   Yes [provider]  EPINEPHrine 0.3 mg/0.3 mL IJ SOAJ injection Inject 0.3 mg into the muscle as needed for anaphylaxis.   Yes [provider]  ezetimibe (ZETIA) 10 MG tablet Take 10 mg by mouth daily. 08/01/20  Yes [provider]  famotidine (PEPCID) 20 MG tablet One after supper Patient taking differently: Take 20 mg by mouth daily after supper. 06/11/22  Yes Tanda Rockers, MD  fluticasone (FLONASE) 50 MCG/ACT nasal spray Place  2 sprays into both nostrils in the morning and at bedtime.   Yes [provider]  ipratropium-albuterol (DUONEB) 0.5-2.5 (3) MG/3ML SOLN TAKE 3 MLS BY NEBULIZATION EVERY 6 (SIX) HOURS AS NEEDED (WHEEZING AND SHORTNESS OF BREATH). 11/22/22  Yes [provider]  JARDIANCE 25 MG TABS tablet Take 25 mg by mouth daily. 07/10/20  Yes [provider]  losartan (COZAAR) 100 MG tablet Take 100 mg by mouth daily.   Yes [provider]  metFORMIN (GLUCOPHAGE) 1000 MG tablet Take 1,000 mg by mouth 2 (two) times daily with a meal.   Yes [provider]  mometasone-formoterol (DULERA) 100-5 MCG/ACT AERO Inhale 2 puffs into the lungs in the morning and at bedtime. 10/24/22  Yes Tanda Rockers, MD  pantoprazole (PROTONIX) 40 MG tablet TAKE 1 TABLET BY MOUTH DAILY. TAKE 30-60 MIN BEFORE FIRST MEAL OF THE DAY Patient taking differently: Take 40 mg by mouth daily as needed (heartburn). 09/05/22  Yes Tanda Rockers, MD  predniSONE (DELTASONE) 20 MG tablet Take 2 tablets (40 mg total) by mouth daily for 4 days. 11/27/22 12/01/22 Yes Ghimire, Dante Gang, MD  prochlorperazine (COMPAZINE) 10 MG tablet TAKE 1 TABLET BY MOUTH EVERY 6 HOURS AS NEEDED FOR NAUSEA OR VOMITING. 10/01/22  Yes [name], [name], MD  REPATHA 140 MG/ML SOSY Inject 140 mg into the skin every 14 (fourteen) days.   Yes [provider]  tamsulosin (FLOMAX) 0.4 MG CAPS capsule Take 1 capsule (0.4 mg total) by mouth daily. 02/12/22  Yes Lucas Mallow, MD  tirzepatide [facility]) 10 MG/0.5ML Pen Inject 10 mg into the skin once a week. Patient taking differently: Inject 10 mg into the skin every Friday. 11/14/21  Yes    ROS-see HPI   Negative unless "+" Constitutional:    weight loss, night sweats, fevers, chills, fatigue, lassitude. HEENT:    headaches, difficulty swallowing, tooth/dental problems, sore throat,       sneezing, itching, ear ache, nasal congestion, post nasal drip, snoring CV:    chest pain,  orthopnea, PND, swelling in lower extremities, anasarca,                                   dizziness, palpitations Resp:   shortness of breath with exertion or at rest.                productive cough,   non-productive cough, coughing up of blood.              change in color of mucus.  wheezing.   Skin:    rash or lesions. GI:  No-   heartburn, indigestion, abdominal pain, nausea, vomiting, diarrhea,                 change in bowel habits, loss of appetite GU: dysuria, change in color of urine, no urgency or frequency.   flank pain. MS:   joint pain, stiffness, decreased range of motion, back pain. Neuro-     nothing unusual Psych:  change in mood or affect.  depression or anxiety.   memory loss. OBJ- Physical Exam General- Alert, Oriented, Affect-appropriate, Distress- none acute Skin- rash-none, lesions- none, excoriation- none  Lymphadenopathy- none Head- atraumatic            Eyes- Gross vision intact, PERRLA, conjunctivae and secretions clear            Ears- Hearing, canals-normal            Nose- Clear, no-Septal dev, mucus, polyps, erosion, perforation             Throat- Mallampati II , mucosa clear , drainage- none, tonsils- atrophic Neck- flexible , trachea midline, no stridor , thyroid nl, carotid no bruit Chest - symmetrical excursion , unlabored           Heart/CV- RRR , no murmur , no gallop  , no rub, nl s1 s2                           - JVD- none , edema- none, stasis changes- none, varices- none           Lung- clear to P&A, wheeze- none, cough- none , dullness-none, rub- none           Chest wall-  Abd-  Br/ Gen/ Rectal- Not done, not indicated Extrem- cyanosis- none, clubbing, none, atrophy- none, strength- nl Neuro- grossly intact to observation

## 2022-12-03 ENCOUNTER — Telehealth: Payer: Self-pay | Admitting: Internal Medicine

## 2022-12-03 ENCOUNTER — Other Ambulatory Visit (HOSPITAL_COMMUNITY): Payer: Self-pay

## 2022-12-03 NOTE — Telephone Encounter (Signed)
Called and spoke to pt about needed a walk test to be re evaluated for oxygen. I did inform pt that he has an upcoming appt with Dr.Wert on 3/20. Pt states he did not want to wait that long, so he has been scheduled with an appt with Beth on 3/15. Nothing further needed.

## 2022-12-03 NOTE — Telephone Encounter (Signed)
Mardene Celeste from [employer] calling bc Pt needs a walk test done, and if it has already been done they need the results in order to proceed with the order

## 2022-12-06 ENCOUNTER — Inpatient Hospital Stay: Payer: BC Managed Care – PPO | Attending: Oncology

## 2022-12-06 ENCOUNTER — Inpatient Hospital Stay (HOSPITAL_BASED_OUTPATIENT_CLINIC_OR_DEPARTMENT_OTHER): Payer: BC Managed Care – PPO | Admitting: Internal Medicine

## 2022-12-06 ENCOUNTER — Inpatient Hospital Stay: Payer: BC Managed Care – PPO

## 2022-12-06 ENCOUNTER — Encounter: Payer: Self-pay | Admitting: Internal Medicine

## 2022-12-06 DIAGNOSIS — C642 Malignant neoplasm of left kidney, except renal pelvis: Secondary | ICD-10-CM | POA: Insufficient documentation

## 2022-12-06 DIAGNOSIS — I129 Hypertensive chronic kidney disease with stage 1 through stage 4 chronic kidney disease, or unspecified chronic kidney disease: Secondary | ICD-10-CM | POA: Diagnosis not present

## 2022-12-06 DIAGNOSIS — E1122 Type 2 diabetes mellitus with diabetic chronic kidney disease: Secondary | ICD-10-CM | POA: Diagnosis not present

## 2022-12-06 DIAGNOSIS — Z79899 Other long term (current) drug therapy: Secondary | ICD-10-CM | POA: Insufficient documentation

## 2022-12-06 DIAGNOSIS — Z5112 Encounter for antineoplastic immunotherapy: Secondary | ICD-10-CM | POA: Insufficient documentation

## 2022-12-06 DIAGNOSIS — Z905 Acquired absence of kidney: Secondary | ICD-10-CM | POA: Insufficient documentation

## 2022-12-06 DIAGNOSIS — N189 Chronic kidney disease, unspecified: Secondary | ICD-10-CM | POA: Insufficient documentation

## 2022-12-06 LAB — CBC WITH DIFFERENTIAL (CANCER CENTER ONLY)
Abs Immature Granulocytes: 0.09 10*3/uL — ABNORMAL HIGH (ref 0.00–0.07)
Basophils Absolute: 0.1 10*3/uL (ref 0.0–0.1)
Basophils Relative: 1 %
Eosinophils Absolute: 0.4 10*3/uL (ref 0.0–0.5)
Eosinophils Relative: 5 %
HCT: 46.4 % (ref 39.0–52.0)
Hemoglobin: 15.8 g/dL (ref 13.0–17.0)
Immature Granulocytes: 1 %
Lymphocytes Relative: 22 %
Lymphs Abs: 1.9 10*3/uL (ref 0.7–4.0)
MCH: 32.4 pg (ref 26.0–34.0)
MCHC: 34.1 g/dL (ref 30.0–36.0)
MCV: 95.1 fL (ref 80.0–100.0)
Monocytes Absolute: 0.6 10*3/uL (ref 0.1–1.0)
Monocytes Relative: 7 %
Neutro Abs: 5.5 10*3/uL (ref 1.7–7.7)
Neutrophils Relative %: 64 %
Platelet Count: 224 10*3/uL (ref 150–400)
RBC: 4.88 MIL/uL (ref 4.22–5.81)
RDW: 12.2 % (ref 11.5–15.5)
WBC Count: 8.6 10*3/uL (ref 4.0–10.5)
nRBC: 0 % (ref 0.0–0.2)

## 2022-12-06 LAB — CMP (CANCER CENTER ONLY)
ALT: 20 U/L (ref 0–44)
AST: 15 U/L (ref 15–41)
Albumin: 4.4 g/dL (ref 3.5–5.0)
Alkaline Phosphatase: 44 U/L (ref 38–126)
Anion gap: 8 (ref 5–15)
BUN: 18 mg/dL (ref 6–20)
CO2: 23 mmol/L (ref 22–32)
Calcium: 9.2 mg/dL (ref 8.9–10.3)
Chloride: 104 mmol/L (ref 98–111)
Creatinine: 1.31 mg/dL — ABNORMAL HIGH (ref 0.61–1.24)
GFR, Estimated: >60 mL/min (ref >60)
Glucose, Bld: 158 mg/dL — ABNORMAL HIGH (ref 70–99)
Potassium: 4.8 mmol/L (ref 3.5–5.1)
Sodium: 135 mmol/L (ref 135–145)
Total Bilirubin: 0.4 mg/dL (ref 0.3–1.2)
Total Protein: 6.9 g/dL (ref 6.5–8.1)

## 2022-12-06 LAB — TSH: TSH: 17.636 u[IU]/mL — ABNORMAL HIGH (ref 0.350–4.500)

## 2022-12-06 MED ORDER — SODIUM CHLORIDE 0.9 % IV SOLN: Freq: Once | INTRAVENOUS | Status: AC

## 2022-12-06 MED ORDER — SODIUM CHLORIDE 0.9 % IV SOLN
200.0000 mg | Freq: Once | INTRAVENOUS | Status: AC
  Administered 2022-12-06: 200 mg via INTRAVENOUS
  Filled 2022-12-06: qty 8

## 2022-12-06 NOTE — Patient Instructions (Signed)
[facility]  Discharge Instructions: Thank you for choosing [facility] to provide your oncology and hematology care.   If you have a lab appointment with the Cancer Center, please go directly to the Cancer Center and check in at the registration area.   Wear comfortable clothing and clothing appropriate for easy access to any Portacath or PICC line.   We strive to give you quality time with your provider. You may need to reschedule your appointment if you arrive late (15 or more minutes).  Arriving late affects you and other patients whose appointments are after yours.  Also, if you miss three or more appointments without notifying the office, you may be dismissed from the clinic at the provider's discretion.      For prescription refill requests, have your pharmacy contact our office and allow 72 hours for refills to be completed.    Today you received the following chemotherapy and/or immunotherapy agents: Keytruda      To help prevent nausea and vomiting after your treatment, we encourage you to take your nausea medication as directed.  BELOW ARE SYMPTOMS THAT SHOULD BE REPORTED IMMEDIATELY: *FEVER GREATER THAN 100.4 F (38 C) OR HIGHER *CHILLS OR SWEATING *NAUSEA AND VOMITING THAT IS NOT CONTROLLED WITH YOUR NAUSEA MEDICATION *UNUSUAL SHORTNESS OF BREATH *UNUSUAL BRUISING OR BLEEDING *URINARY PROBLEMS (pain or burning when urinating, or frequent urination) *BOWEL PROBLEMS (unusual diarrhea, constipation, pain near the anus) TENDERNESS IN MOUTH AND THROAT WITH OR WITHOUT PRESENCE OF ULCERS (sore throat, sores in mouth, or a toothache) UNUSUAL RASH, SWELLING OR PAIN  UNUSUAL VAGINAL DISCHARGE OR ITCHING   Items with * indicate a potential emergency and should be followed up as soon as possible or go to the Emergency Department if any problems should occur.  Please show the CHEMOTHERAPY ALERT CARD or IMMUNOTHERAPY ALERT CARD at  check-in to the Emergency Department and triage nurse.  Should you have questions after your visit or need to cancel or reschedule your appointment, please contact [facility]  Dept: [phone]  and follow the prompts.  Office hours are 8:00 a.m. to 4:30 p.m. Monday - Friday. Please note that voicemails left after 4:00 p.m. may not be returned until the following business day.  We are closed weekends and major holidays. You have access to a nurse at all times for urgent questions. Please call the main number to the clinic Dept: [phone] and follow the prompts.   For any non-urgent questions, you may also contact your provider using MyChart. We now offer e-Visits for anyone 18 and older to request care online for non-urgent symptoms. For details visit [email or web address].   Also download the MyChart app! Go to the app store, search "MyChart", open the app, select [facility], and log in with your MyChart username and password.   

## 2022-12-06 NOTE — Progress Notes (Signed)
Patient seen by MD today  Vitals are within treatment parameters.  Labs reviewed: and are within treatment parameters.  Per physician team, patient is ready for treatment and there are NO modifications to the treatment plan.  

## 2022-12-06 NOTE — Progress Notes (Signed)
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  PRIOR THERAPY: Status post left laparoscopic radical nephrectomy on 03/12/2022 under the care of Dr. Gloriann Loan and the final pathology showed clear-cell renal cell carcinoma nuclear grade 4 measuring 7.0 cm with tumor extension into the renal vein and renal sinus.  CURRENT THERAPY: Adjuvant immunotherapy with Keytruda 200 Mg IV every 3 weeks status post 8 cycles.  INTERVAL HISTORY: Russell Burns III 57 y.o. male returns with clinic today for follow-up visit.  The patient is feeling fine today with no concerning complaints except for an episode of shortness of breath and oxygen desaturation few days ago.  He was seen at the emergency department and his oxygen saturation was down to 85.  He was transferred to [facility] and treated overnight with prednisone and close monitoring.  He had CT angiogram of the chest at that time that was unremarkable for any acute process in the lung.  He completed his treatment with prednisone several days ago.  He denied having any current chest pain, shortness of breath, cough or hemoptysis.  He has no nausea, vomiting, diarrhea or constipation.  He has no headache or visual changes.  He is here today for evaluation before starting cycle #9 of his treatment.  MEDICAL HISTORY: Past Medical History:  Diagnosis Date   Allergy    Aortic stenosis    Arthritis    Blood transfusion without reported diagnosis    Cancer ([place])    Chronic kidney disease    Diabetes mellitus without complication (HCC)    FH: thoracic aneurysm    Hyperlipidemia    Hypertension    Obesity     ALLERGIES:  is allergic to bee venom and other.  MEDICATIONS:  Current Outpatient Medications   Medication Sig Dispense Refill   acetaminophen (TYLENOL) 500 MG tablet Take 1,000 mg by mouth in the morning.     albuterol (PROVENTIL) (2.5 MG/3ML) 0.083% nebulizer solution Take 3 mLs (2.5 mg total) by nebulization every 6 (six) hours as needed for wheezing or shortness of breath. 75 mL 12   albuterol (VENTOLIN HFA) 108 (90 Base) MCG/ACT inhaler Inhale 2 puffs into the lungs every 6 (six) hours as needed for wheezing or shortness of breath.     amLODipine (NORVASC) 10 MG tablet Take 10 mg by mouth daily.     atorvastatin (LIPITOR) 80 MG tablet Take 80 mg by mouth daily.     Budeson-Glycopyrrol-Formoterol (BREZTRI AEROSPHERE) 160-9-4.8 MCG/ACT AERO Inhale 2 puffs into the lungs in the morning and at bedtime. 2 each 0   cetirizine (ZYRTEC) 10 MG tablet Take 10 mg by mouth 2 (two) times daily.     EPINEPHrine 0.3 mg/0.3 mL IJ SOAJ injection Inject 0.3 mg into the muscle as needed for anaphylaxis.     ezetimibe (ZETIA) 10 MG tablet Take 10 mg by mouth daily.     famotidine (PEPCID) 20 MG tablet One after supper (Patient taking differently: Take 20 mg by mouth daily after supper.) 30 tablet 11   fluticasone (FLONASE) 50 MCG/ACT nasal spray Place 2 sprays into both nostrils in the morning and at bedtime.     ipratropium-albuterol (DUONEB) 0.5-2.5 (3) MG/3ML SOLN TAKE 3 MLS BY NEBULIZATION EVERY 6 (SIX) HOURS AS NEEDED (WHEEZING AND  SHORTNESS OF BREATH).     JARDIANCE 25 MG TABS tablet Take 25 mg by mouth daily.     losartan (COZAAR) 100 MG tablet Take 100 mg by mouth daily.     metFORMIN (GLUCOPHAGE) 1000 MG tablet Take 1,000 mg by mouth 2 (two) times daily with a meal.     mometasone-formoterol (DULERA) 100-5 MCG/ACT AERO Inhale 2 puffs into the lungs in the morning and at bedtime. 13 g 5   pantoprazole (PROTONIX) 40 MG tablet TAKE 1 TABLET BY MOUTH DAILY. TAKE 30-60 MIN BEFORE FIRST MEAL OF THE DAY (Patient taking differently: Take 40 mg by mouth daily as needed (heartburn).) 90 tablet 2    prochlorperazine (COMPAZINE) 10 MG tablet TAKE 1 TABLET BY MOUTH EVERY 6 HOURS AS NEEDED FOR NAUSEA OR VOMITING. 30 tablet 0   REPATHA 140 MG/ML SOSY Inject 140 mg into the skin every 14 (fourteen) days.     tamsulosin (FLOMAX) 0.4 MG CAPS capsule Take 1 capsule (0.4 mg total) by mouth daily. 14 capsule 0   tirzepatide (MOUNJARO) 10 MG/0.5ML Pen Inject 10 mg into the skin once a week. (Patient taking differently: Inject 10 mg into the skin every Friday.) 6 mL 3   No current facility-administered medications for this visit.    SURGICAL HISTORY:  Past Surgical History:  Procedure Laterality Date   CYSTOSCOPY WITH URETEROSCOPY AND STENT PLACEMENT Left 02/12/2022   Procedure: CYSTOSCOPY WITH LEFT RETROGRADE PYELOGRAM, DIAGNOSTIC URETEROSCOPY AND STENT PLACEMENT;  Surgeon: Lucas Mallow, MD;  Location: [facility];  Service: Urology;  Laterality: Left;   LAPAROSCOPIC NEPHRECTOMY, HAND ASSISTED Left 03/12/2022   Procedure: LEFT HAND ASSISTED LAPAROSCOPIC NEPHRECTOMY;  Surgeon: Lucas Mallow, MD;  Location: [facility];  Service: Urology;  Laterality: Left;  NEED 2.5 HRS FOR THIS CASE   TONSILLECTOMY     WISDOM TOOTH EXTRACTION      REVIEW OF SYSTEMS:  A comprehensive review of systems was negative.   PHYSICAL EXAMINATION: General appearance: alert, cooperative, and no distress Head: Normocephalic, without obvious abnormality, atraumatic Neck: no adenopathy, no JVD, supple, symmetrical, trachea midline, and thyroid not enlarged, symmetric, no tenderness/mass/nodules Lymph nodes: Cervical, supraclavicular, and axillary nodes normal. Resp: clear to auscultation bilaterally Back: symmetric, no curvature. ROM normal. No CVA tenderness. Cardio: regular rate and rhythm, S1, S2 normal, no murmur, click, rub or gallop GI: soft, non-tender; bowel sounds normal; no masses,  no organomegaly Extremities: extremities normal, atraumatic, no cyanosis or edema  ECOG PERFORMANCE STATUS: 1 - Symptomatic but  completely ambulatory  Blood pressure (!) 144/94, pulse 75, temperature 98.3 F (36.8 C), temperature source Oral, resp. rate 17, weight 242 lb 8 oz (110 kg), SpO2 96 %.  LABORATORY DATA: Lab Results  Component Value Date   WBC 8.6 12/06/2022   HGB 15.8 12/06/2022   HCT 46.4 12/06/2022   MCV 95.1 12/06/2022   PLT 224 12/06/2022      Chemistry      Component Value Date/Time   NA 135 12/06/2022 1043   NA 139 07/25/2022 0935   K 4.8 12/06/2022 1043   CL 104 12/06/2022 1043   CO2 23 12/06/2022 1043   BUN 18 12/06/2022 1043   BUN 23 07/25/2022 0935   CREATININE 1.31 (H) 12/06/2022 1043      Component Value Date/Time   CALCIUM 9.2 12/06/2022 1043   ALKPHOS 44 12/06/2022 1043   AST 15 12/06/2022 1043   ALT 20 12/06/2022 1043   BILITOT 0.4 12/06/2022 1043  RADIOGRAPHIC STUDIES: CT ANGIO CHEST AORTA W/CM &/OR WO/CM  Result Date: 11/26/2022 CLINICAL DATA:  Aortic aneurysm suspected, dyspnea. EXAM: CT ANGIOGRAPHY CHEST WITH CONTRAST TECHNIQUE: Multidetector CT imaging of the chest was performed using the standard protocol during bolus administration of intravenous contrast. Multiplanar CT image reconstructions and MIPs were obtained to evaluate the vascular anatomy. RADIATION DOSE REDUCTION: This exam was performed according to the departmental dose-optimization program which includes automated exposure control, adjustment of the mA and/or kV according to patient size and/or use of iterative reconstruction technique. CONTRAST:  51m OMNIPAQUE IOHEXOL 350 MG/ML SOLN COMPARISON:  CT angiogram chest 07/05/2022 FINDINGS: Cardiovascular: Preferential opacification of the thoracic aorta. Ascending aorta measures up to 4.5 cm, unchanged. There is no evidence for dissection. There are atherosclerotic calcifications of the aorta. Normal heart size. No pericardial effusion. Mediastinum/Nodes: Mildly enlarged right hilar lymph node measures 11 mm. This is unchanged. No new enlarged lymph nodes  are identified. Visualized esophagus and thyroid gland are within normal limits. Lungs/Pleura: A few calcified nodules are unchanged. No new pulmonary nodules. No focal lung infiltrate, pleural effusion or pneumothorax. Upper Abdomen: No acute abnormality. Musculoskeletal: No chest wall abnormality. No acute or significant osseous findings. Review of the MIP images confirms the above findings. IMPRESSION: 1. Stable ascending thoracic aortic aneurysm measuring 4.5 cm. Ascending thoracic aortic aneurysm. Recommend semi-annual imaging followup by CTA or MRA and referral to cardiothoracic surgery if not already obtained. This recommendation follows 2010 ACCF/AHA/AATS/ACR/ASA/SCA/SCAI/SIR/STS/SVM Guidelines for the Diagnosis and Management of Patients With Thoracic Aortic Disease. Circulation. 2010; 121:[id number] Aortic aneurysm NOS (ICD10-I71.9) 2. No acute process in the chest. 3. Stable mildly enlarged right hilar lymph node, likely reactive. Aortic Atherosclerosis (ICD10-I70.0). Electronically Signed   By: ARonney AstersM.D.   On: 11/26/2022 15:10   DG Chest 2 View  Result Date: 11/26/2022 CLINICAL DATA:  Shortness of breath, cough EXAM: CHEST - 2 VIEW COMPARISON:  Radiograph 09/03/2022 FINDINGS: The heart size and mediastinal contours are within normal limits.No focal airspace disease. No pleural effusion or pneumothorax.Thoracic spondylosis. No acute osseous abnormality. IMPRESSION: No evidence of acute cardiopulmonary disease. Electronically Signed   By: JMaurine SimmeringM.D.   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  He is status post left laparoscopic radical nephrectomy on 03/12/2022 under the care of Dr. BGloriann Loanand the final pathology showed clear-cell renal cell carcinoma nuclear grade 4 measuring 7.0 cm with tumor extension into the  renal vein and renal sinus. The patient is currently undergoing treatment with Adjuvant immunotherapy with Keytruda 200 Mg IV every 3 weeks status post 8 cycles. The patient has been tolerating this treatment well with no concerning adverse effects. I recommended for him to proceed with cycle #9 today as planned. For the shortness of breath, he is currently followed by Dr. WMelvyn Novasand he changed some of his inhaler medications. I will see him back for follow-up visit in 3 weeks for evaluation before starting cycle #10. The patient was advised to call immediately if he has any concerning symptoms in the interval. The patient voices understanding of current disease status and treatment options and is in agreement with the current care plan.  All questions were answered. The patient knows to call the clinic with any problems, questions or concerns. We can certainly see the patient much sooner if  necessary.  The total time spent in the appointment was 25 minutes.  Disclaimer: This note was dictated with voice recognition software. Similar sounding words can inadvertently be transcribed and may not be corrected upon review.

## 2022-12-07 ENCOUNTER — Ambulatory Visit (INDEPENDENT_AMBULATORY_CARE_PROVIDER_SITE_OTHER): Payer: BC Managed Care – PPO | Admitting: Primary Care

## 2022-12-07 ENCOUNTER — Other Ambulatory Visit: Payer: Self-pay | Admitting: Physician Assistant

## 2022-12-07 ENCOUNTER — Encounter: Payer: Self-pay | Admitting: Primary Care

## 2022-12-07 VITALS — BP 138/82 | HR 84 | Ht 70.0 in | Wt 243.0 lb

## 2022-12-07 DIAGNOSIS — J9601 Acute respiratory failure with hypoxia: Secondary | ICD-10-CM | POA: Diagnosis not present

## 2022-12-07 DIAGNOSIS — J4489 Other specified chronic obstructive pulmonary disease: Secondary | ICD-10-CM | POA: Diagnosis not present

## 2022-12-07 DIAGNOSIS — E039 Hypothyroidism, unspecified: Secondary | ICD-10-CM

## 2022-12-07 LAB — T4: T4, Total: 5 ug/dL (ref 4.5–12.0)

## 2022-12-07 MED ORDER — LEVOTHYROXINE SODIUM 25 MCG PO TABS: 25.0000 ug | ORAL_TABLET | Freq: Every day | ORAL | 2 refills | Status: DC

## 2022-12-07 NOTE — Assessment & Plan Note (Addendum)
-   Patient was admitted for acute hypoxic respiratory failure.  He was seen for posthospital follow-up by Dr. Maple Hudson on 3//24, O2 on arrival was 87% on room air.  Patient was started on home oxygen at 2 L.  Patient presents today for qualifying O2 walk.  Feeling significantly better since starting Breztri and completing prednisone taper. He is no longer requiring oxygen. SpO2 remained at 95% on RA after walking 500-750 ft. Ok to discontinue supplemental oxygen.

## 2022-12-07 NOTE — Progress Notes (Signed)
I called the patient about his TSH. We will start him on a low dose of synthroid. He will begin taking this. We will continue to monitor on subsequent blood work.

## 2022-12-07 NOTE — Progress Notes (Unsigned)
@Patient  ID: Russell Burns, male    DOB: October 02, 1965, 57 y.o.   MRN: [id number]  Chief Complaint  Patient presents with   Follow-up    Breathing has been good    Referring provider: [name], MD  HPI: 57 year old male, never smoked. PMH significant for CHF, HTN, thoracic aortic aneurysm, occupational lung disease, respiratory failure, GERD, renal mass.   12/07/2022 Patient was admitted on 11/26/22 for acute respiratory failure. He was discharged on oxygen. He is feeling significantly better since completing prednisone course and being started on Breztri Aerosphere two puffs twice daily. Completed prednisone course. Using duoneb less frequent.  He has a baseline morning cough. No longer wheezing. O2 remained >95% RA on walk today. CTA No acute process, stable ascending thoracic aortic aneurysm measuring 4.5cm.   Allergies  Allergen Reactions   Bee Venom Anaphylaxis   Other Itching, Swelling and Other (See Comments)    Feline dander- Runny nose, itchy/puffy eyes    Immunization History  Administered Date(s) Administered   Influenza-Unspecified 08/13/2022   PFIZER(Purple Top)SARS-COV-2 Vaccination 12/18/2019, 01/12/2020    Past Medical History:  Diagnosis Date   Allergy    Aortic stenosis    Arthritis    Blood transfusion without reported diagnosis    Cancer ([facility])    Chronic kidney disease    Diabetes mellitus without complication (HCC)    FH: thoracic aneurysm    Hyperlipidemia    Hypertension    Obesity     Tobacco History: Social History   Tobacco Use  Smoking Status Never  Smokeless Tobacco Current   Types: Chew   Ready to quit: Not Answered Counseling given: Not Answered   Outpatient Medications Prior to Visit  Medication Sig Dispense Refill   acetaminophen (TYLENOL) 500 MG tablet Take 1,000 mg by mouth in the morning.     albuterol (PROVENTIL) (2.5 MG/3ML) 0.083% nebulizer solution Take 3 mLs (2.5 mg total) by nebulization every 6 (six) hours as  needed for wheezing or shortness of breath. 75 mL 12   albuterol (VENTOLIN HFA) 108 (90 Base) MCG/ACT inhaler Inhale 2 puffs into the lungs every 6 (six) hours as needed for wheezing or shortness of breath.     amLODipine (NORVASC) 10 MG tablet Take 10 mg by mouth daily.     atorvastatin (LIPITOR) 80 MG tablet Take 80 mg by mouth daily.     benzonatate (TESSALON) 200 MG capsule Take 200 mg by mouth 3 (three) times daily as needed for cough.     Budeson-Glycopyrrol-Formoterol (BREZTRI AEROSPHERE) 160-9-4.8 MCG/ACT AERO Inhale 2 puffs into the lungs in the morning and at bedtime. 2 each 0   cetirizine (ZYRTEC) 10 MG tablet Take 10 mg by mouth 2 (two) times daily.     EPINEPHrine 0.3 mg/0.3 mL IJ SOAJ injection Inject 0.3 mg into the muscle as needed for anaphylaxis.     ezetimibe (ZETIA) 10 MG tablet Take 10 mg by mouth daily.     famotidine (PEPCID) 20 MG tablet One after supper (Patient taking differently: Take 20 mg by mouth daily after supper.) 30 tablet 11   fluticasone (FLONASE) 50 MCG/ACT nasal spray Place 2 sprays into both nostrils in the morning and at bedtime.     ipratropium-albuterol (DUONEB) 0.5-2.5 (3) MG/3ML SOLN TAKE 3 MLS BY NEBULIZATION EVERY 6 (SIX) HOURS AS NEEDED (WHEEZING AND SHORTNESS OF BREATH).     JARDIANCE 25 MG TABS tablet Take 25 mg by mouth daily.     losartan (COZAAR)  100 MG tablet Take 100 mg by mouth daily.     metFORMIN (GLUCOPHAGE) 1000 MG tablet Take 1,000 mg by mouth 2 (two) times daily with a meal.     mometasone-formoterol (DULERA) 100-5 MCG/ACT AERO Inhale 2 puffs into the lungs in the morning and at bedtime. 13 g 5   ONETOUCH VERIO test strip 1 each by Other route 5 (five) times daily.     pantoprazole (PROTONIX) 40 MG tablet TAKE 1 TABLET BY MOUTH DAILY. TAKE 30-60 MIN BEFORE FIRST MEAL OF THE DAY (Patient taking differently: Take 40 mg by mouth daily as needed (heartburn).) 90 tablet 2   prochlorperazine (COMPAZINE) 10 MG tablet TAKE 1 TABLET BY MOUTH EVERY  6 HOURS AS NEEDED FOR NAUSEA OR VOMITING. 30 tablet 0   REPATHA 140 MG/ML SOSY Inject 140 mg into the skin every 14 (fourteen) days.     tamsulosin (FLOMAX) 0.4 MG CAPS capsule Take 1 capsule (0.4 mg total) by mouth daily. 14 capsule 0   tirzepatide (MOUNJARO) 10 MG/0.5ML Pen Inject 10 mg into the skin once a week. (Patient taking differently: Inject 10 mg into the skin every Friday.) 6 mL 3   No facility-administered medications prior to visit.   Review of Systems  Review of Systems  Constitutional: Negative.   HENT: Negative.    Respiratory: Negative.  Negative for cough, shortness of breath and wheezing.   Cardiovascular: Negative.    Physical Exam  BP 138/82 (BP Location: Left Arm, Patient Position: Sitting, Cuff Size: Normal)   Pulse 84   Ht 5\' 10"  (1.778 m)   Wt 243 lb (110.2 kg)   SpO2 97%   BMI 34.87 kg/m  Physical Exam Constitutional:      Appearance: Normal appearance.  HENT:     Head: Normocephalic and atraumatic.     Mouth/Throat:     Mouth: Mucous membranes are moist.     Pharynx: Oropharynx is clear.  Cardiovascular:     Rate and Rhythm: Normal rate and regular rhythm.  Pulmonary:     Effort: Pulmonary effort is normal.     Breath sounds: Normal breath sounds. No rhonchi or rales.     Comments: Isolate wheeze left upper lobe, otherwise clear  Skin:    General: Skin is warm and dry.  Neurological:     General: No focal deficit present.     Mental Status: He is alert and oriented to person, place, and time. Mental status is at baseline.  Psychiatric:        Mood and Affect: Mood normal.        Behavior: Behavior normal.        Thought Content: Thought content normal.        Judgment: Judgment normal.      Lab Results:  CBC    Component Value Date/Time   WBC 8.6 12/06/2022 1043   WBC 8.6 11/27/2022 0723   RBC 4.88 12/06/2022 1043   HGB 15.8 12/06/2022 1043   HCT 46.4 12/06/2022 1043   PLT 224 12/06/2022 1043   MCV 95.1 12/06/2022 1043   MCH  32.4 12/06/2022 1043   MCHC 34.1 12/06/2022 1043   RDW 12.2 12/06/2022 1043   LYMPHSABS 1.9 12/06/2022 1043   MONOABS 0.6 12/06/2022 1043   EOSABS 0.4 12/06/2022 1043   BASOSABS 0.1 12/06/2022 1043    BMET    Component Value Date/Time   NA 135 12/06/2022 1043   NA 139 07/25/2022 0935   K 4.8 12/06/2022 1043  CL 104 12/06/2022 1043   CO2 23 12/06/2022 1043   GLUCOSE 158 (H) 12/06/2022 1043   BUN 18 12/06/2022 1043   BUN 23 07/25/2022 0935   CREATININE 1.31 (H) 12/06/2022 1043   CALCIUM 9.2 12/06/2022 1043   GFRNONAA >60 12/06/2022 1043    BNP    Component Value Date/Time   BNP 20.7 11/26/2022 1205    ProBNP    Component Value Date/Time   PROBNP 43.0 06/11/2022 1600    Imaging: CT ANGIO CHEST AORTA W/CM &/OR WO/CM  Result Date: 11/26/2022 CLINICAL DATA:  Aortic aneurysm suspected, dyspnea. EXAM: CT ANGIOGRAPHY CHEST WITH CONTRAST TECHNIQUE: Multidetector CT imaging of the chest was performed using the standard protocol during bolus administration of intravenous contrast. Multiplanar CT image reconstructions and MIPs were obtained to evaluate the vascular anatomy. RADIATION DOSE REDUCTION: This exam was performed according to the departmental dose-optimization program which includes automated exposure control, adjustment of the mA and/or kV according to patient size and/or use of iterative reconstruction technique. CONTRAST:  73mL OMNIPAQUE IOHEXOL 350 MG/ML SOLN COMPARISON:  CT angiogram chest 07/05/2022 FINDINGS: Cardiovascular: Preferential opacification of the thoracic aorta. Ascending aorta measures up to 4.5 cm, unchanged. There is no evidence for dissection. There are atherosclerotic calcifications of the aorta. Normal heart size. No pericardial effusion. Mediastinum/Nodes: Mildly enlarged right hilar lymph node measures 11 mm. This is unchanged. No new enlarged lymph nodes are identified. Visualized esophagus and thyroid gland are within normal limits. Lungs/Pleura: A few  calcified nodules are unchanged. No new pulmonary nodules. No focal lung infiltrate, pleural effusion or pneumothorax. Upper Abdomen: No acute abnormality. Musculoskeletal: No chest wall abnormality. No acute or significant osseous findings. Review of the MIP images confirms the above findings. IMPRESSION: 1. Stable ascending thoracic aortic aneurysm measuring 4.5 cm. Ascending thoracic aortic aneurysm. Recommend semi-annual imaging followup by CTA or MRA and referral to cardiothoracic surgery if not already obtained. This recommendation follows 2010 ACCF/AHA/AATS/ACR/ASA/SCA/SCAI/SIR/STS/SVM Guidelines for the Diagnosis and Management of Patients With Thoracic Aortic Disease. Circulation. 2010; [id number]. Aortic aneurysm NOS (ICD10-I71.9) 2. No acute process in the chest. 3. Stable mildly enlarged right hilar lymph node, likely reactive. Aortic Atherosclerosis (ICD10-I70.0). Electronically Signed   By: Ronney Asters M.D.   On: 11/26/2022 15:10   DG Chest 2 View  Result Date: 11/26/2022 CLINICAL DATA:  Shortness of breath, cough EXAM: CHEST - 2 VIEW COMPARISON:  Radiograph 09/03/2022 FINDINGS: The heart size and mediastinal contours are within normal limits.No focal airspace disease. No pleural effusion or pneumothorax.Thoracic spondylosis. No acute osseous abnormality. IMPRESSION: No evidence of acute cardiopulmonary disease. Electronically Signed   By: Maurine Simmering M.D.   On: 11/26/2022 12:48     Assessment & Plan:   Acute respiratory failure with hypoxia [facility]) - Patient was admitted for acute hypoxic respiratory failure.  He was seen for posthospital follow-up by Dr. Annamaria Boots on 3//24, O2 on arrival was 87% on room air.  Patient was started on home oxygen at 2 L.  Patient presents today for qualifying O2 walk.  Feeling significantly better since starting Breztri and completing prednisone taper. He is no longer requiring oxygen. SpO2 remained at 95% on RA after walking 500-750 ft. Ok to discontinue  supplemental oxygen.   Asthmatic bronchitis , chronic - Stable/improved; Continue Breztri aerosphere two puffs twice daily  Martyn Ehrich, NP 12/10/2022

## 2022-12-07 NOTE — Patient Instructions (Signed)
Recommendations: Continue Breztri 2 puffs morning and evening Use DuoNeb every 6-8 hours as needed for breakthrough shortness of breath or wheezing Continue Zyrtec 10mg  daily Call if you need additional prednisone/ wheezing worsens   Orders: Discontinue oxygen   Follow-up Keep apt with Dr. Melvyn Novas as scheduled

## 2022-12-10 ENCOUNTER — Telehealth: Payer: Self-pay | Admitting: Internal Medicine

## 2022-12-10 ENCOUNTER — Telehealth: Payer: Self-pay

## 2022-12-10 NOTE — Assessment & Plan Note (Addendum)
-   Stable/improved; Continue Breztri aerosphere two puffs twice daily

## 2022-12-10 NOTE — Telephone Encounter (Signed)
error 

## 2022-12-10 NOTE — Telephone Encounter (Signed)
Bernie from [facility] states pt was sent home with one of their O2 tanks.  Unsure how pt was given O2 tank - hospital or from our office.  It looks like an order was sent to [facility] & then canceled a week later.  Unsure how to respond to this, please follow up with Bernie from [facility] - phone# [phone]

## 2022-12-11 ENCOUNTER — Ambulatory Visit (INDEPENDENT_AMBULATORY_CARE_PROVIDER_SITE_OTHER): Payer: BC Managed Care – PPO | Admitting: Internal Medicine

## 2022-12-11 DIAGNOSIS — J45909 Unspecified asthma, uncomplicated: Secondary | ICD-10-CM | POA: Diagnosis not present

## 2022-12-11 LAB — PULMONARY FUNCTION TEST
DL/VA % pred: 139 %
DL/VA: 6.03 ml/min/mmHg/L
DLCO cor % pred: 116 %
DLCO cor: 32.89 ml/min/mmHg
DLCO unc % pred: 119 %
DLCO unc: 33.96 ml/min/mmHg
FEF 25-75 Post: 2.71 L/sec
FEF 25-75 Pre: 2.05 L/sec
FEF2575-%Change-Post: 32 %
FEF2575-%Pred-Post: 85 %
FEF2575-%Pred-Pre: 64 %
FEV1-%Change-Post: 6 %
FEV1-%Pred-Post: 82 %
FEV1-%Pred-Pre: 76 %
FEV1-Post: 3.07 L
FEV1-Pre: 2.88 L
FEV1FVC-%Change-Post: 6 %
FEV1FVC-%Pred-Pre: 95 %
FEV6-%Change-Post: 0 %
FEV6-%Pred-Post: 83 %
FEV6-%Pred-Pre: 83 %
FEV6-Post: 3.91 L
FEV6-Pre: 3.93 L
FEV6FVC-%Change-Post: 0 %
FEV6FVC-%Pred-Post: 104 %
FEV6FVC-%Pred-Pre: 104 %
FVC-%Change-Post: 0 %
FVC-%Pred-Post: 80 %
FVC-%Pred-Pre: 80 %
FVC-Post: 3.95 L
FVC-Pre: 3.94 L
Post FEV1/FVC ratio: 78 %
Post FEV6/FVC ratio: 100 %
Pre FEV1/FVC ratio: 73 %
Pre FEV6/FVC Ratio: 100 %
RV % pred: 106 %
RV: 2.31 L
TLC % pred: 85 %
TLC: 5.97 L

## 2022-12-11 NOTE — Telephone Encounter (Signed)
Called and left detailed message for Russell Burns to call me back. Patient states that the o2 tank is from [facility] and that they are on their way to come pick up supplies now.

## 2022-12-11 NOTE — Patient Instructions (Signed)
Full PFT performed today. °

## 2022-12-11 NOTE — Telephone Encounter (Signed)
I have called BERNIE at [phone] three different times already in regards to this patient. I have left three different voicemails for him to call me back. Left patients name and DOB on voicemail in which I'm calling in regards to about oxygen tank. I have called [facility]!!!

## 2022-12-11 NOTE — Telephone Encounter (Signed)
Please contact Russell Burns from [facility] at [phone].  [name] states no one has contacted [employer] regarding this patient.

## 2022-12-11 NOTE — Progress Notes (Signed)
Full PFT performed today. °

## 2022-12-11 NOTE — Telephone Encounter (Signed)
Russell Burns sent me a follow up Email stating he hasn't heard anything.  I let him know that April called and left a message at 10:21 today.  Please call Russell Burns.

## 2022-12-11 NOTE — Telephone Encounter (Signed)
Called and left message for Russell Burns to call office back in regards to oxygen tank

## 2022-12-12 ENCOUNTER — Ambulatory Visit: Payer: BC Managed Care – PPO | Admitting: Internal Medicine

## 2022-12-12 ENCOUNTER — Encounter: Payer: Self-pay | Admitting: Internal Medicine

## 2022-12-12 VITALS — BP 124/72 | HR 86 | Temp 98.5°F | Ht 70.0 in | Wt 246.8 lb

## 2022-12-12 DIAGNOSIS — J4489 Other specified chronic obstructive pulmonary disease: Secondary | ICD-10-CM

## 2022-12-12 MED ORDER — BREZTRI AEROSPHERE 160-9-4.8 MCG/ACT IN AERO: 2.0000 | INHALATION_SPRAY | Freq: Two times a day (BID) | RESPIRATORY_TRACT | 11 refills | Status: AC

## 2022-12-12 MED ORDER — BREZTRI AEROSPHERE 160-9-4.8 MCG/ACT IN AERO: 2.0000 | INHALATION_SPRAY | Freq: Two times a day (BID) | RESPIRATORY_TRACT | 0 refills | Status: DC

## 2022-12-12 NOTE — Patient Instructions (Addendum)
Plan A = Automatic = Always=    Breztri Take 2 puffs first thing in am and then another 2 puffs about 12 hours later.     Plan B = Backup (to supplement plan A, not to replace it) Only use your albuterol inhaler as a rescue medication to be used if you can't catch your breath by resting or doing a relaxed purse lip breathing pattern.  - The less you use it, the better it will work when you need it. - Ok to use the inhaler up to 2 puffs  every 4 hours if you must but call for appointment if use goes up over your usual need - Don't leave home without it !!  (think of it like the spare tire for your car)   Plan C = Crisis (instead of Plan B but only if Plan B stops working) - only use your albuterol nebulizer if you first try Plan B and it fails to help > ok to use the nebulizer up to every 4 hours but if start needing it regularly call for immediate appointment   Also  Ok to try albuterol 15 min before an activity (on alternating days)  that you know would usually make you short of breath and see if it makes any difference and if makes none then don't take albuterol after activity unless you can't catch your breath as this means it's the resting that helps, not the albuterol.   Please schedule a follow up office visit in 6 weeks, call sooner if needed

## 2022-12-12 NOTE — Telephone Encounter (Signed)
I called and spoke with Russell Burns at [facility]  He states that he believes that this pt has their tank- he signed form for it  However, the order for this was sent to [facility] instead  I called and spoke with the pt and he states that the tank in questions was picked up from his home yesterday  He does NOT think that this tank belonged to [facility]  I called Melissa with [employer] and spoke with her regarding this  She states that she is going to look into who the driver was that picked the tank up, and call back with more information Will hold in my basket until I hear back from [facility], and then will call and update Russell Burns

## 2022-12-12 NOTE — Progress Notes (Signed)
Russell Burns, male    DOB: 11-Jun-1966   MRN: [id number]   Brief patient profile:  57  yowm never smoker seasonal rhinitis spring > fall rx drixoral  referred to pulmonary clinic 06/11/2022 by Russell Burns for sob/ wife Russell Burns  Has had good ex tolerance / very active up and down steps then 1st covid Dec 2020 got infusion sick 3 weeks but back to baseline then 2nd infection just sniffles oct 2022 then Jan 2023 severe coughing fits with sob mostly daytime then April  2023 dx renal cell  cancer cough syncope   > admit     Admit date: 01/01/2022 Discharge date: 01/07/2022 Acute respiratory failure with hypoxia (HCC) -Presented with hypoxia down to 88 to 89% requiring 3 L via nasal cannula.  He reports about 4 months of symptoms of nonproductive cough with exertional dyspnea and orthopnea.  CTA of the chest is negative for pulmonary embolism but showed findings suggestive of small airway disease with mucous plugging of the lower lobes.  CTA chest in January  showed airway debris bilaterally concerning for bronchitis versus aspiration.  He has no formal diagnosis of COPD and only has remote history of tobacco use 20 years ago,but reports -maternal uncle with COPD due to alpha antitrypsin deficiency. -4/11 Alpha-1 antitrypsin pending -Unclear if this is really COPD. he will need outpatient PFT   -DuoNeb QID -Trial of nebulized hypertonic saline and inhaled steroid for mucolytic.  -4/11 chest physiotherapy TID.  -daily IV Solu-Medrol 60mg  -4/12 resolved: DC Solu-Medrol - 4/12 Prednisone 50 mg x 5 days    Left Renal mass/suspicious cyst of the upper pole RIGHT kidney -4/11 review of CT scanning of abdomen 4/4 shows mass in LEFT kidney consistent with malignancy, and suspicious mass in right kidney. - 4/12 discussed case with Dr Diona Fanti [facility] stated equipment required to remove kidney not in hospital.  Patient to follow-up upon discharge, to discuss  LEFT kidney resection -Follow-up with urologist  ASAP upon discharge   HLD (hyperlipidemia) Continue home meds once med rec is complete   Thoracic aortic aneurysm (Russell Burns) -Stable 4.6 cm ascending aortic aneurysm.  See CTA PE protocol below -4/11 patient never seen cardiothoracic surgery, and has SIGNIFICANT family history of ruptured AAA in his family. - 4/12 discussed case with Dr. Roxan Hockey Cardiothoracic Surgery. patient is to follow-up with him in 6 months.     Chronic diastolic CHF/Moderate AS -Echo in 09/2021 with EF of 55 to 60% and grade 1 diastolic dysfunction. - If symptoms do not improve with pulmonary therapy, could consider consult to cardiology to discuss whether symptoms are related to his moderate aortic stenosis.     Essential hypertension -BP goal <120/80 if patient can tolerate, secondary to AAA. - Amlodipine 10 mg daily - Losartan 100 mg daily -Hydralazine PRN SBP> 130 or DBP> 90 -4/12 BP fairly well controlled on current medication   Diabetes mellitus without complication (HCC) - Discharged on home medication   Obese (BMI 34.4 kg/m) -Discuss weight loss strategies with PCP     Discharge Diagnoses:  Principal Problem:   Acute respiratory failure with hypoxia (Russell Burns)   Diabetes mellitus without complication (Russell Burns)   Essential hypertension   Thoracic aortic aneurysm (HCC)   HLD (hyperlipidemia)   Left renal mass   Chronic diastolic CHF (congestive heart failure) (Russell Burns)      History of Present Illness  06/11/2022  Pulmonary/ 1st office eval/Russell Burns  was 80% better p admit until a week p 1st use of Keytruda which  was on 05/10/22  Chief Complaint  Patient presents with   Consult    Referred by PCP for cough and increased SOB. States this has been going on since January 2023. Was in the hospital back in April 2023 for the same concern.   Dyspnea:  MMRC3 = can't walk 100 yards even at a slow pace at a flat grade s stopping due to sob   Cough: worse at hs dry with subjective wheeze  Sleep: bolt upright x 2 weeks   SABA use: anoro daily/ saba not really sure it's helping  Rec Prednisone 10 mg Take 4 for three days 3 for three days 2 for three days 1 for three days and stop  For cough > Promethazine DM 2 tsp four times daily and supplement with oxycodone 5 mg every 4 hours as needed  For tickle >  For drainage / throat tickle try take CHLORPHENIRAMINE  4 mg Prilosec  (20 x 2) 40 mg  (Pantoprazole 40 mg)   Take  30-60 min before first meal of the day and Pepcid (famotidine)  20 mg after supper until return to office - this is the best way to tell whether stomach acid is contributing to your problem.   GERD diet No more [place] or anoro Ok to use albuterol up to 2 puffs every 4 hours if you feel it helps your breathing or cough  Please schedule a follow up office visit in 2 weeks, sooner if needed- to [facility] ER in meantime if getting worse or 02 sats on RA < 90% at rest   Admit date: 06/14/2022 Discharge date: 06/19/2022   Admitted From: Home Disposition:  Home    Brief/Interim Summary:   Patient is a 57 year old male with history of renal cell carcinoma on Keytruda, thoracic aortic aneurysm, hyperlipidemia, hypertension, diabetes type 2 presented with 1 week history of shortness of breath, cough.  Patient was also recently prescribed steroids by his pulmonologist for suspicion of COPD.  On presentation ,he was hemodynamically stable.  Required 2 to 5 L of oxygen for maintenance of saturation.  Chest x-ray did not show any acute findings.  CT PE study was negative for PE but showed bilateral bronchial wall thickening, scattered bibasilar consolidation, mucous plugging.  Imaging suspicious for postobstructive pneumonia.  Patient was started on broad spectrum antibiotics, bronchodilators.  Respiratory status significantly improving.  Currently down to 3 L.  He qualified for home oxygen.  He is medically stable for discharge home today.  He will follow-up with Dr. Melvyn Novas as an outpatient.     Following problems were  addressed during his hospitalization:   Acute hypoxic respiratory failure: Presented with shortness of breath, cough.  Not on oxygen at home.  Required 2 to 5 L of oxygen on presentation.  Patient was started on short course of steroids from outpatient pulmonology without improvement. Chest x-ray did not show any acute findings.  CT PE study was negative for PE but showed bilateral bronchial wall thickening, scattered bibasilar consolidation, mucous plugging.  Imaging suspicious for postobstructive pneumonia.  Patient was started on broad spectrum antibiotics, bronchodilators.  COVID, respiratory viral panel negative.  Patient is bieng treated for renal cell carcinoma so there is possibility of pneumonitis as well.  Being work-up for diagnosis of COPD as an outpatient.  Imaging showed bronchial wall thickening.  Treated with steroids, bronchodilator.  Procalcitonin non-reassuring. He feels much better .  We requested for chest physiotherapy. Continue incentive spirometer, flutter valve.  Current down to  2 L.  He qualified for home oxygen for 3 L .  Continue prednisone on tapering dose.     Renal cell carcinoma: Currently on Keytruda, follows with Dr. Alen Blew.  Status post left nephrectomy.  Pathology showed clear-cell renal cell carcinoma.  Continue home tamsulosin   History of thoracic aortic aneurysm: Stable.  Imaging showed 4.6 cm ascending thoracic aortic aneurysm.  Semiannual follow-up with CTA/MRI recommended.  He needs to follow-up with vascular surgery as an outpatient.  He is scheduled to follow-up with Dr. Roxan Hockey in 6 months    History of diastolic CHF: Last echo showed EF of 55 to [id number], grade 1 diastolic dysfunction.  Currently not on diuretics.          Discharge Diagnoses:  Principal Problem:   Acute respiratory failure with hypoxia (HCC) Active Problems:   Diabetes mellitus without complication (HCC)   Essential hypertension   HLD (hyperlipidemia)   Chronic diastolic CHF  (congestive heart failure) ([place])   Malignant neoplasm of left kidney (HCC)   GERD (gastroesophageal reflux disease)       06/27/2022  f/u ov/Vitoria Conyer re: acute resp failure   maint on prn ventolin and last dose of pred 06/27/2022  Chief Complaint  Patient presents with   Follow-up    Better 94%, sob and cough-gray better   Dyspnea:  across parking lots ok with or without 02  Cough: a bit of grey mucus still mostly in am  Sleeping: recliner 20 degrees = baseline  SABA use: twice daily  02: 3lpm hs and prn  Covid status:  vax max  Rec Plan A = Automatic = Always=    Symbicort 80 Take 2 puffs first thing in am and then another 2 puffs about 12 hours later.   Work on inhaler technique:  Plan B = Backup (to supplement plan A, not to replace it) Only use your albuterol inhaler as a rescue medication  Ok to try albuterol 15 min before an activity (on alternating days)  that you know would usually make you short of breath Make sure you check your oxygen saturation  AT  your highest level of activity (not after you stop)   to be sure it stays over 90%  Ok to return to work without restrictions Jul 02 2022 but you will access to 02 and your rescue inhaler     07/25/2022  f/u ov/Marchelle Rinella re: acute resp failure secondary to Keytruda pneumonitis  maint on symb 80 2bid    Chief Complaint  Patient presents with   Follow-up    Breathing is much improved. He has not had to use albuterol in the past 2 wks.   Dyspnea:  walking all over the plant, including steps Cough: resolved on h1 bid  Sleeping: on side bed flat  SABA use: not 02: not  Covid status:   vax x 4  Rec -Continue Albuterol inhaler 2 puffs or 3 mL neb every 6 hours as needed for shortness of breath or wheezing. Notify if symptoms persist despite rescue inhaler/neb use. Orders placed for nebulizer today. You should use your breathing treatments at least four times a day until symptoms improve -Continue Symbicort 2 puffs Twice daily. Brush  tongue and rinse mouth afterwards -Continue Zyrtec 1 tab daily for allergies -Continue flonase nasal spray 2 sprays each nostril daily -Continue protonix 1 tab daily  Prednisone taper. 4 tabs for 2 days, then 3 tabs for 2 days, 2 tabs for 2 days, then 1 tab for 2  days, then stop. Take in AM with food. Start tomorrow Azithromycin 2 tablets on day one then 1 tablet daily for four additional days. Take with food Guaifenesin [phone] mg Twice daily for congestion    09/14/2022  f/u ov/Toyia Jelinek re: ? Inhalational injury at work vs [place] toxicity maint on symbicort 80 2 bid    and no longer on 02  Chief Complaint  Patient presents with   Follow-up    SaO2 sats at home 92%.  By afternoon 94%.  Did cough up a lot of mucus on 09/12/2022 and 09/13/2022.  Breathing a lot easier now.  Dyspnea:  walking all day 18 k steps sats lower 90s  Cough: tapering off/ was very productive now min rattle  Sleeping: recliner 45 degrees  SABA use:4 pm after symb 80 7am / neb  02: none  Rec Mucinex should be 1200 mg every 12 hours and flutter valve as much as possible Please schedule a follow up visit in 3 months but call sooner if needed   12/12/2022  f/u ov/Monaca Wadas re: inhalational injuries ?TDI   maint on breztri since 11/30/22  / last dose of Prednisone 12/01/22  Paulette Blanch 12/06/22 and no gerd Chief Complaint  Patient presents with   Follow-up    No c/o, using Breztri   Dyspnea:  walking fast all day at plant/  sats lowest 95% RA Cough: no Sleeping: lying flat ok  SABA use:  occ near 11th hour of breztri / neb bid ? Why (none on day of pfts which were nl  12/11/22)  02: none    No obvious day to day or daytime variability or assoc excess/ purulent sputum or mucus plugs or hemoptysis or cp or chest tightness, subjective wheeze or overt sinus or hb symptoms.   Sleeping  without nocturnal  or early am exacerbation  of respiratory  c/o's or need for noct saba. Also denies any obvious fluctuation of symptoms with weather  or environmental changes or other aggravating or alleviating factors except as outlined above   No unusual exposure hx or h/o childhood pna/ asthma or knowledge of premature birth.  Current Allergies, Complete Past Medical History, Past Surgical History, Family History, and Social History were reviewed in [employer] record.  ROS  The following are not active complaints unless bolded Hoarseness, sore throat, dysphagia, dental problems, itching, sneezing,  nasal congestion or discharge of excess mucus or purulent secretions, ear ache,   fever, chills, sweats, unintended wt loss or wt gain, classically pleuritic or exertional cp,  orthopnea pnd or arm/hand swelling  or leg swelling, presyncope, palpitations, abdominal pain, anorexia, nausea, vomiting, diarrhea  or change in bowel habits or change in bladder habits, change in stools or change in urine, dysuria, hematuria,  rash, arthralgias, visual complaints, headache, numbness, weakness or ataxia or problems with walking or coordination,  change in mood or  memory.        Current Meds  Medication Sig   acetaminophen (TYLENOL) 500 MG tablet Take 1,000 mg by mouth in the morning.   albuterol (PROVENTIL) (2.5 MG/3ML) 0.083% nebulizer solution Take 3 mLs (2.5 mg total) by nebulization every 6 (six) hours as needed for wheezing or shortness of breath.   albuterol (VENTOLIN HFA) 108 (90 Base) MCG/ACT inhaler Inhale 2 puffs into the lungs every 6 (six) hours as needed for wheezing or shortness of breath.   amLODipine (NORVASC) 10 MG tablet Take 10 mg by mouth daily.   atorvastatin (LIPITOR) 80 MG tablet  Take 80 mg by mouth daily.   Budeson-Glycopyrrol-Formoterol (BREZTRI AEROSPHERE) 160-9-4.8 MCG/ACT AERO Inhale 2 puffs into the lungs in the morning and at bedtime.   cetirizine (ZYRTEC) 10 MG tablet Take 10 mg by mouth 2 (two) times daily.   EPINEPHrine 0.3 mg/0.3 mL IJ SOAJ injection Inject 0.3 mg into the muscle as needed for  anaphylaxis.   ezetimibe (ZETIA) 10 MG tablet Take 10 mg by mouth daily.   famotidine (PEPCID) 20 MG tablet One after supper (Patient taking differently: Take 20 mg by mouth daily after supper.)   fluticasone (FLONASE) 50 MCG/ACT nasal spray Place 2 sprays into both nostrils in the morning and at bedtime.   ipratropium-albuterol (DUONEB) 0.5-2.5 (3) MG/3ML SOLN TAKE 3 MLS BY NEBULIZATION EVERY 6 (SIX) HOURS AS NEEDED (WHEEZING AND SHORTNESS OF BREATH).   JARDIANCE 25 MG TABS tablet Take 25 mg by mouth daily.   levothyroxine (SYNTHROID) 25 MCG tablet Take 1 tablet (25 mcg total) by mouth daily before breakfast.   losartan (COZAAR) 100 MG tablet Take 100 mg by mouth daily.   metFORMIN (GLUCOPHAGE) 1000 MG tablet Take 1,000 mg by mouth 2 (two) times daily with a meal.   ONETOUCH VERIO test strip 1 each by Other route 5 (five) times daily.   pantoprazole (PROTONIX) 40 MG tablet TAKE 1 TABLET BY MOUTH DAILY. TAKE 30-60 MIN BEFORE FIRST MEAL OF THE DAY (Patient taking differently: Take 40 mg by mouth daily as needed (heartburn).)   prochlorperazine (COMPAZINE) 10 MG tablet TAKE 1 TABLET BY MOUTH EVERY 6 HOURS AS NEEDED FOR NAUSEA OR VOMITING.   REPATHA 140 MG/ML SOSY Inject 140 mg into the skin every 14 (fourteen) days.   tamsulosin (FLOMAX) 0.4 MG CAPS capsule Take 1 capsule (0.4 mg total) by mouth daily.   tirzepatide (MOUNJARO) 10 MG/0.5ML Pen Inject 10 mg into the skin once a week. (Patient taking differently: Inject 10 mg into the skin every Friday.)              Past Medical History:  Diagnosis Date   Allergy    Aortic stenosis    Arthritis    Blood transfusion without reported diagnosis    Cancer ([place])    Chronic kidney disease    Diabetes mellitus without complication (HCC)    FH: thoracic aneurysm    Hyperlipidemia    Hypertension    Obesity        Objective:    12/12/2022       246  09/14/2022     242  07/25/2022       247  06/27/2022       247   06/11/22 235 lb 6.4 oz  (106.8 kg)  05/31/22 239 lb 6.4 oz (108.6 kg)  05/10/22 239 lb (108.4 kg)      Vital signs reviewed  12/12/2022  - Note at rest 02 sats  97% on RA   General appearance:    amb pleasant wm nad   HEENT : Oropharynx  clear    NECK :  without  apparent JVD/ palpable Nodes/TM    LUNGS: no acc muscle use,  Nl contour chest which is clear to A and P bilaterally without cough on insp or exp maneuvers   CV:  RRR  no s3 or murmur or increase in P2, and no edema   ABD:  soft and nontender    MS:  Nl gait/ ext warm without deformities Or obvious joint restrictions  calf tenderness, cyanosis or clubbing  Assessment

## 2022-12-12 NOTE — Assessment & Plan Note (Addendum)
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  Total time for H and P, chart review, counseling, reviewing hfa  device(s) and generating customized AVS unique to this office visit including written Action Plan  / same day charting = 33 min summary visit with pft review

## 2022-12-13 NOTE — Telephone Encounter (Signed)
Melissa called back and advised that they are sending a delivery driver to [place] this wk to deliver them a tank  I spoke with [name] and notified him of this and he verbalized understanding  Nothing further needed

## 2022-12-27 ENCOUNTER — Inpatient Hospital Stay: Payer: BC Managed Care – PPO

## 2022-12-27 ENCOUNTER — Inpatient Hospital Stay (HOSPITAL_BASED_OUTPATIENT_CLINIC_OR_DEPARTMENT_OTHER): Payer: BC Managed Care – PPO | Admitting: Internal Medicine

## 2022-12-27 ENCOUNTER — Encounter: Payer: Self-pay | Admitting: Medical Oncology

## 2022-12-27 ENCOUNTER — Inpatient Hospital Stay: Payer: BC Managed Care – PPO | Attending: Oncology

## 2022-12-27 ENCOUNTER — Encounter: Payer: Self-pay | Admitting: Internal Medicine

## 2022-12-27 VITALS — BP 126/83 | HR 72 | Resp 18

## 2022-12-27 DIAGNOSIS — Z905 Acquired absence of kidney: Secondary | ICD-10-CM | POA: Diagnosis not present

## 2022-12-27 DIAGNOSIS — E039 Hypothyroidism, unspecified: Secondary | ICD-10-CM | POA: Diagnosis not present

## 2022-12-27 DIAGNOSIS — C642 Malignant neoplasm of left kidney, except renal pelvis: Secondary | ICD-10-CM

## 2022-12-27 DIAGNOSIS — Z5112 Encounter for antineoplastic immunotherapy: Secondary | ICD-10-CM | POA: Diagnosis not present

## 2022-12-27 DIAGNOSIS — Z79899 Other long term (current) drug therapy: Secondary | ICD-10-CM | POA: Insufficient documentation

## 2022-12-27 LAB — CMP (CANCER CENTER ONLY)
ALT: 21 U/L (ref 0–44)
AST: 19 U/L (ref 15–41)
Albumin: 4.5 g/dL (ref 3.5–5.0)
Alkaline Phosphatase: 60 U/L (ref 38–126)
Anion gap: 9 (ref 5–15)
BUN: 27 mg/dL — ABNORMAL HIGH (ref 6–20)
CO2: 24 mmol/L (ref 22–32)
Calcium: 9.9 mg/dL (ref 8.9–10.3)
Chloride: 105 mmol/L (ref 98–111)
Creatinine: 1.4 mg/dL — ABNORMAL HIGH (ref 0.61–1.24)
GFR, Estimated: 59 mL/min — ABNORMAL LOW (ref >60)
Glucose, Bld: 144 mg/dL — ABNORMAL HIGH (ref 70–99)
Potassium: 4.5 mmol/L (ref 3.5–5.1)
Sodium: 138 mmol/L (ref 135–145)
Total Bilirubin: 0.4 mg/dL (ref 0.3–1.2)
Total Protein: 7.6 g/dL (ref 6.5–8.1)

## 2022-12-27 LAB — CBC WITH DIFFERENTIAL (CANCER CENTER ONLY)
Abs Immature Granulocytes: 0.02 10*3/uL (ref 0.00–0.07)
Basophils Absolute: 0.1 10*3/uL (ref 0.0–0.1)
Basophils Relative: 1 %
Eosinophils Absolute: 0.4 10*3/uL (ref 0.0–0.5)
Eosinophils Relative: 6 %
HCT: 46.6 % (ref 39.0–52.0)
Hemoglobin: 16.2 g/dL (ref 13.0–17.0)
Immature Granulocytes: 0 %
Lymphocytes Relative: 25 %
Lymphs Abs: 1.6 10*3/uL (ref 0.7–4.0)
MCH: 32.9 pg (ref 26.0–34.0)
MCHC: 34.8 g/dL (ref 30.0–36.0)
MCV: 94.5 fL (ref 80.0–100.0)
Monocytes Absolute: 0.5 10*3/uL (ref 0.1–1.0)
Monocytes Relative: 8 %
Neutro Abs: 3.9 10*3/uL (ref 1.7–7.7)
Neutrophils Relative %: 60 %
Platelet Count: 317 10*3/uL (ref 150–400)
RBC: 4.93 MIL/uL (ref 4.22–5.81)
RDW: 11.9 % (ref 11.5–15.5)
WBC Count: 6.4 10*3/uL (ref 4.0–10.5)
nRBC: 0 % (ref 0.0–0.2)

## 2022-12-27 LAB — TSH: TSH: 4.253 u[IU]/mL (ref 0.350–4.500)

## 2022-12-27 MED ORDER — PROCHLORPERAZINE MALEATE 10 MG PO TABS: 10.0000 mg | ORAL_TABLET | Freq: Four times a day (QID) | ORAL | 0 refills | Status: DC | PRN

## 2022-12-27 MED ORDER — SODIUM CHLORIDE 0.9 % IV SOLN
200.0000 mg | Freq: Once | INTRAVENOUS | Status: AC
  Administered 2022-12-27: 200 mg via INTRAVENOUS
  Filled 2022-12-27: qty 200

## 2022-12-27 MED ORDER — SODIUM CHLORIDE 0.9 % IV SOLN: Freq: Once | INTRAVENOUS | Status: AC

## 2022-12-27 NOTE — Progress Notes (Signed)
Patient seen by Dr. Mohamed  Vitals are within treatment parameters.  Labs reviewed: and are within treatment parameters.  Per physician team, patient is ready for treatment and there are NO modifications to the treatment plan.  

## 2022-12-27 NOTE — Patient Instructions (Signed)
[facility]  Discharge Instructions: Thank you for choosing [facility] to provide your oncology and hematology care.   If you have a lab appointment with the Cancer Center, please go directly to the Cancer Center and check in at the registration area.   Wear comfortable clothing and clothing appropriate for easy access to any Portacath or PICC line.   We strive to give you quality time with your provider. You may need to reschedule your appointment if you arrive late (15 or more minutes).  Arriving late affects you and other patients whose appointments are after yours.  Also, if you miss three or more appointments without notifying the office, you may be dismissed from the clinic at the provider's discretion.      For prescription refill requests, have your pharmacy contact our office and allow 72 hours for refills to be completed.    Today you received the following chemotherapy and/or immunotherapy agents: Keytruda      To help prevent nausea and vomiting after your treatment, we encourage you to take your nausea medication as directed.  BELOW ARE SYMPTOMS THAT SHOULD BE REPORTED IMMEDIATELY: *FEVER GREATER THAN 100.4 F (38 C) OR HIGHER *CHILLS OR SWEATING *NAUSEA AND VOMITING THAT IS NOT CONTROLLED WITH YOUR NAUSEA MEDICATION *UNUSUAL SHORTNESS OF BREATH *UNUSUAL BRUISING OR BLEEDING *URINARY PROBLEMS (pain or burning when urinating, or frequent urination) *BOWEL PROBLEMS (unusual diarrhea, constipation, pain near the anus) TENDERNESS IN MOUTH AND THROAT WITH OR WITHOUT PRESENCE OF ULCERS (sore throat, sores in mouth, or a toothache) UNUSUAL RASH, SWELLING OR PAIN  UNUSUAL VAGINAL DISCHARGE OR ITCHING   Items with * indicate a potential emergency and should be followed up as soon as possible or go to the Emergency Department if any problems should occur.  Please show the CHEMOTHERAPY ALERT CARD or IMMUNOTHERAPY ALERT CARD at  check-in to the Emergency Department and triage nurse.  Should you have questions after your visit or need to cancel or reschedule your appointment, please contact [facility]  Dept: [phone]  and follow the prompts.  Office hours are 8:00 a.m. to 4:30 p.m. Monday - Friday. Please note that voicemails left after 4:00 p.m. may not be returned until the following business day.  We are closed weekends and major holidays. You have access to a nurse at all times for urgent questions. Please call the main number to the clinic Dept: [phone] and follow the prompts.   For any non-urgent questions, you may also contact your provider using MyChart. We now offer e-Visits for anyone 18 and older to request care online for non-urgent symptoms. For details visit [email or web address].   Also download the MyChart app! Go to the app store, search "MyChart", open the app, select [facility], and log in with your MyChart username and password.   

## 2022-12-27 NOTE — Progress Notes (Signed)
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  PRIOR THERAPY: Status post left laparoscopic radical nephrectomy on 03/12/2022 under the care of Dr. Gloriann Loan and the final pathology showed clear-cell renal cell carcinoma nuclear grade 4 measuring 7.0 cm with tumor extension into the renal vein and renal sinus.  CURRENT THERAPY: Adjuvant immunotherapy with Keytruda 200 Mg IV every 3 weeks status post 9 cycles.  INTERVAL HISTORY: Russell Burns 57 y.o. male returns to the clinic today for follow-up visit.  The patient is feeling fine today with no concerning complaints except for occasional nausea.  He denied having any chest pain, shortness of breath, cough or hemoptysis.  He has occasional nausea but no vomiting, diarrhea or constipation.  He has no headache or visual changes.  He continues to tolerate his treatment with immunotherapy fairly well.  He is here today for evaluation before starting cycle #10.  MEDICAL HISTORY: Past Medical History:  Diagnosis Date   Allergy    Aortic stenosis    Arthritis    Blood transfusion without reported diagnosis    Cancer    Chronic kidney disease    Diabetes mellitus without complication    FH: thoracic aneurysm    Hyperlipidemia    Hypertension    Obesity     ALLERGIES:  is allergic to bee venom and other.  MEDICATIONS:  Current Outpatient Medications  Medication Sig Dispense Refill   acetaminophen (TYLENOL) 500 MG tablet Take 1,000 mg by mouth in the morning.     albuterol (PROVENTIL) (2.5 MG/3ML) 0.083% nebulizer solution Take 3 mLs (2.5 mg total) by nebulization every 6 (six) hours as needed for wheezing or shortness of breath. 75 mL 12   albuterol (VENTOLIN HFA) 108 (90 Base) MCG/ACT inhaler Inhale 2  puffs into the lungs every 6 (six) hours as needed for wheezing or shortness of breath.     amLODipine (NORVASC) 10 MG tablet Take 10 mg by mouth daily.     atorvastatin (LIPITOR) 80 MG tablet Take 80 mg by mouth daily.     Budeson-Glycopyrrol-Formoterol (BREZTRI AEROSPHERE) 160-9-4.8 MCG/ACT AERO Inhale 2 puffs into the lungs 2 (two) times daily. Take 2 puffs first thing in am and then another 2 puffs about 12 hours later. 10.7 g 11   Budeson-Glycopyrrol-Formoterol (BREZTRI AEROSPHERE) 160-9-4.8 MCG/ACT AERO Inhale 2 puffs into the lungs in the morning and at bedtime. 5.9 g 0   cetirizine (ZYRTEC) 10 MG tablet Take 10 mg by mouth 2 (two) times daily.     EPINEPHrine 0.3 mg/0.3 mL IJ SOAJ injection Inject 0.3 mg into the muscle as needed for anaphylaxis.     ezetimibe (ZETIA) 10 MG tablet Take 10 mg by mouth daily.     famotidine (PEPCID) 20 MG tablet One after supper (Patient taking differently: Take 20 mg by mouth daily after supper.) 30 tablet 11   fluticasone (FLONASE) 50 MCG/ACT nasal spray Place 2 sprays into both nostrils in the morning and at bedtime.     ipratropium-albuterol (DUONEB) 0.5-2.5 (3) MG/3ML SOLN TAKE 3 MLS BY NEBULIZATION EVERY 6 (SIX) HOURS AS NEEDED (WHEEZING AND SHORTNESS OF BREATH).     JARDIANCE 25 MG TABS tablet Take 25 mg by mouth daily.     levothyroxine (SYNTHROID) 25 MCG tablet Take  1 tablet (25 mcg total) by mouth daily before breakfast. 30 tablet 2   losartan (COZAAR) 100 MG tablet Take 100 mg by mouth daily.     metFORMIN (GLUCOPHAGE) 1000 MG tablet Take 1,000 mg by mouth 2 (two) times daily with a meal.     ONETOUCH VERIO test strip 1 each by Other route 5 (five) times daily.     pantoprazole (PROTONIX) 40 MG tablet TAKE 1 TABLET BY MOUTH DAILY. TAKE 30-60 MIN BEFORE FIRST MEAL OF THE DAY (Patient taking differently: Take 40 mg by mouth daily as needed (heartburn).) 90 tablet 2   prochlorperazine (COMPAZINE) 10 MG tablet TAKE 1 TABLET BY MOUTH EVERY 6 HOURS AS  NEEDED FOR NAUSEA OR VOMITING. 30 tablet 0   REPATHA 140 MG/ML SOSY Inject 140 mg into the skin every 14 (fourteen) days.     tamsulosin (FLOMAX) 0.4 MG CAPS capsule Take 1 capsule (0.4 mg total) by mouth daily. 14 capsule 0   tirzepatide (MOUNJARO) 10 MG/0.5ML Pen Inject 10 mg into the skin once a week. (Patient taking differently: Inject 10 mg into the skin every Friday.) 6 mL 3   No current facility-administered medications for this visit.    SURGICAL HISTORY:  Past Surgical History:  Procedure Laterality Date   CYSTOSCOPY WITH URETEROSCOPY AND STENT PLACEMENT Left 02/12/2022   Procedure: CYSTOSCOPY WITH LEFT RETROGRADE PYELOGRAM, DIAGNOSTIC URETEROSCOPY AND STENT PLACEMENT;  Surgeon: Lucas Mallow, MD;  Location: [facility];  Service: Urology;  Laterality: Left;   LAPAROSCOPIC NEPHRECTOMY, HAND ASSISTED Left 03/12/2022   Procedure: LEFT HAND ASSISTED LAPAROSCOPIC NEPHRECTOMY;  Surgeon: Lucas Mallow, MD;  Location: [facility];  Service: Urology;  Laterality: Left;  NEED 2.5 HRS FOR THIS CASE   TONSILLECTOMY     WISDOM TOOTH EXTRACTION      REVIEW OF SYSTEMS:  A comprehensive review of systems was negative.   PHYSICAL EXAMINATION: General appearance: alert, cooperative, and no distress Head: Normocephalic, without obvious abnormality, atraumatic Neck: no adenopathy, no JVD, supple, symmetrical, trachea midline, and thyroid not enlarged, symmetric, no tenderness/mass/nodules Lymph nodes: Cervical, supraclavicular, and axillary nodes normal. Resp: clear to auscultation bilaterally Back: symmetric, no curvature. ROM normal. No CVA tenderness. Cardio: regular rate and rhythm, S1, S2 normal, no murmur, click, rub or gallop GI: soft, non-tender; bowel sounds normal; no masses,  no organomegaly Extremities: extremities normal, atraumatic, no cyanosis or edema  ECOG PERFORMANCE STATUS: 1 - Symptomatic but completely ambulatory  Blood pressure (!) 135/91, pulse 75, temperature 98.2 F  (36.8 C), temperature source Oral, resp. rate 16, weight 239 lb 12.8 oz (108.8 kg), SpO2 97 %.  LABORATORY DATA: Lab Results  Component Value Date   WBC 6.4 12/27/2022   HGB 16.2 12/27/2022   HCT 46.6 12/27/2022   MCV 94.5 12/27/2022   PLT 317 12/27/2022      Chemistry      Component Value Date/Time   NA 135 12/06/2022 1043   NA 139 07/25/2022 0935   K 4.8 12/06/2022 1043   CL 104 12/06/2022 1043   CO2 23 12/06/2022 1043   BUN 18 12/06/2022 1043   BUN 23 07/25/2022 0935   CREATININE 1.31 (H) 12/06/2022 1043      Component Value Date/Time   CALCIUM 9.2 12/06/2022 1043   ALKPHOS 44 12/06/2022 1043   AST 15 12/06/2022 1043   ALT 20 12/06/2022 1043   BILITOT 0.4 12/06/2022 1043       RADIOGRAPHIC STUDIES: No results found.  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  He is status post left laparoscopic radical nephrectomy on 03/12/2022 under the care of Dr. Gloriann Loan and the final pathology showed clear-cell renal cell carcinoma nuclear grade 4 measuring 7.0 cm with tumor extension into the renal vein and renal sinus. The patient is currently undergoing treatment with Adjuvant immunotherapy with Keytruda 200 Mg IV every 3 weeks status post 9 cycles. The patient has been tolerating this treatment well with no concerning adverse effects except for occasional nausea. I recommended for him to proceed with cycle #10 today as planned. I will see him back for follow-up visit in 3 weeks for evaluation with repeat CT scan of the abdomen and pelvis for restaging of his disease. For the hypothyroidism, started the patient on levothyroxine. For the nausea, I will give him refill of Compazine. For the shortness of breath, he is currently followed by Dr. Melvyn Novas and he changed some of his inhaler medications. The patient was advised to call  immediately if he has any concerning symptoms in the interval. The patient voices understanding of current disease status and treatment options and is in agreement with the current care plan.  All questions were answered. The patient knows to call the clinic with any problems, questions or concerns. We can certainly see the patient much sooner if necessary.  The total time spent in the appointment was 20 minutes.  Disclaimer: This note was dictated with voice recognition software. Similar sounding words can inadvertently be transcribed and may not be corrected upon review.

## 2023-01-04 ENCOUNTER — Other Ambulatory Visit: Payer: Self-pay

## 2023-01-04 DIAGNOSIS — E1165 Type 2 diabetes mellitus with hyperglycemia: Secondary | ICD-10-CM | POA: Diagnosis not present

## 2023-01-04 DIAGNOSIS — C642 Malignant neoplasm of left kidney, except renal pelvis: Secondary | ICD-10-CM | POA: Diagnosis not present

## 2023-01-04 DIAGNOSIS — R053 Chronic cough: Secondary | ICD-10-CM | POA: Diagnosis not present

## 2023-01-04 DIAGNOSIS — J709 Respiratory conditions due to unspecified external agent: Secondary | ICD-10-CM | POA: Diagnosis not present

## 2023-01-08 ENCOUNTER — Other Ambulatory Visit: Payer: Self-pay

## 2023-01-14 ENCOUNTER — Ambulatory Visit (HOSPITAL_COMMUNITY)
Admission: RE | Admit: 2023-01-14 | Discharge: 2023-01-14 | Disposition: A | Discharge status: Home / Self Care | Payer: BC Managed Care – PPO | Source: Ambulatory Visit | Attending: Internal Medicine | Admitting: Internal Medicine

## 2023-01-14 ENCOUNTER — Encounter (HOSPITAL_COMMUNITY): Payer: Self-pay

## 2023-01-14 DIAGNOSIS — C642 Malignant neoplasm of left kidney, except renal pelvis: Secondary | ICD-10-CM | POA: Diagnosis not present

## 2023-01-14 MED ORDER — SODIUM CHLORIDE (PF) 0.9 % IJ SOLN
INTRAMUSCULAR | Status: AC
  Filled 2023-01-14: qty 50

## 2023-01-17 ENCOUNTER — Inpatient Hospital Stay (HOSPITAL_BASED_OUTPATIENT_CLINIC_OR_DEPARTMENT_OTHER): Payer: BC Managed Care – PPO | Admitting: Internal Medicine

## 2023-01-17 ENCOUNTER — Inpatient Hospital Stay: Payer: BC Managed Care – PPO

## 2023-01-17 ENCOUNTER — Inpatient Hospital Stay: Payer: BC Managed Care – PPO | Admitting: Nutrition

## 2023-01-17 ENCOUNTER — Other Ambulatory Visit: Payer: Self-pay

## 2023-01-17 ENCOUNTER — Telehealth: Payer: Self-pay | Admitting: Medical Oncology

## 2023-01-17 ENCOUNTER — Encounter: Payer: Self-pay | Admitting: Medical Oncology

## 2023-01-17 VITALS — BP 119/76 | HR 84 | Temp 97.8°F | Resp 17

## 2023-01-17 DIAGNOSIS — C642 Malignant neoplasm of left kidney, except renal pelvis: Secondary | ICD-10-CM

## 2023-01-17 DIAGNOSIS — E039 Hypothyroidism, unspecified: Secondary | ICD-10-CM | POA: Diagnosis not present

## 2023-01-17 DIAGNOSIS — Z79899 Other long term (current) drug therapy: Secondary | ICD-10-CM | POA: Diagnosis not present

## 2023-01-17 DIAGNOSIS — Z905 Acquired absence of kidney: Secondary | ICD-10-CM | POA: Diagnosis not present

## 2023-01-17 DIAGNOSIS — Z5112 Encounter for antineoplastic immunotherapy: Secondary | ICD-10-CM | POA: Diagnosis not present

## 2023-01-17 LAB — CBC WITH DIFFERENTIAL (CANCER CENTER ONLY)
Abs Immature Granulocytes: 0.01 10*3/uL (ref 0.00–0.07)
Basophils Absolute: 0.1 10*3/uL (ref 0.0–0.1)
Basophils Relative: 1 %
Eosinophils Absolute: 0.4 10*3/uL (ref 0.0–0.5)
Eosinophils Relative: 7 %
HCT: 43.7 % (ref 39.0–52.0)
Hemoglobin: 15.3 g/dL (ref 13.0–17.0)
Immature Granulocytes: 0 %
Lymphocytes Relative: 32 %
Lymphs Abs: 1.8 10*3/uL (ref 0.7–4.0)
MCH: 32.8 pg (ref 26.0–34.0)
MCHC: 35 g/dL (ref 30.0–36.0)
MCV: 93.8 fL (ref 80.0–100.0)
Monocytes Absolute: 0.5 10*3/uL (ref 0.1–1.0)
Monocytes Relative: 8 %
Neutro Abs: 2.8 10*3/uL (ref 1.7–7.7)
Neutrophils Relative %: 52 %
Platelet Count: 178 10*3/uL (ref 150–400)
RBC: 4.66 MIL/uL (ref 4.22–5.81)
RDW: 12.1 % (ref 11.5–15.5)
WBC Count: 5.5 10*3/uL (ref 4.0–10.5)
nRBC: 0 % (ref 0.0–0.2)

## 2023-01-17 LAB — CMP (CANCER CENTER ONLY)
ALT: 16 U/L (ref 0–44)
AST: 13 U/L — ABNORMAL LOW (ref 15–41)
Albumin: 4.5 g/dL (ref 3.5–5.0)
Alkaline Phosphatase: 58 U/L (ref 38–126)
Anion gap: 8 (ref 5–15)
BUN: 18 mg/dL (ref 6–20)
CO2: 25 mmol/L (ref 22–32)
Calcium: 9.3 mg/dL (ref 8.9–10.3)
Chloride: 103 mmol/L (ref 98–111)
Creatinine: 1.24 mg/dL (ref 0.61–1.24)
GFR, Estimated: >60 mL/min (ref >60)
Glucose, Bld: 145 mg/dL — ABNORMAL HIGH (ref 70–99)
Potassium: 4.5 mmol/L (ref 3.5–5.1)
Sodium: 136 mmol/L (ref 135–145)
Total Bilirubin: 0.5 mg/dL (ref 0.3–1.2)
Total Protein: 7 g/dL (ref 6.5–8.1)

## 2023-01-17 MED ORDER — SODIUM CHLORIDE 0.9 % IV SOLN
200.0000 mg | Freq: Once | INTRAVENOUS | Status: AC
  Administered 2023-01-17: 200 mg via INTRAVENOUS
  Filled 2023-01-17: qty 200

## 2023-01-17 MED ORDER — SODIUM CHLORIDE 0.9 % IV SOLN: Freq: Once | INTRAVENOUS | Status: AC

## 2023-01-17 NOTE — Progress Notes (Signed)
[facility] Telephone:([phone]   [phone]  OFFICE PROGRESS NOTE  [name], MD [address] 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  PRIOR THERAPY: Status post left laparoscopic radical nephrectomy on 03/12/2022 under the care of Dr. 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  CURRENT THERAPY: Adjuvant immunotherapy with Keytruda 200 Mg IV every 3 weeks status post 10 cycles.  INTERVAL HISTORY: Russell Burns III 57 y.o. male returns to the clinic today for follow-up visit.  The patient is feeling fine today with no concerning complaints.  He has no chest pain, shortness of breath, cough or hemoptysis.  He denied having any fever or chills.  He has no nausea, vomiting, diarrhea or constipation.  He denied having any significant weight loss or night sweats.  He is here today for evaluation with repeat CT scan of the abdomen pelvis for restaging of his disease.  MEDICAL HISTORY: Past Medical History:  Diagnosis Date   Allergy    Aortic stenosis    Arthritis    Blood transfusion without reported diagnosis    Cancer    Chronic kidney disease    Diabetes mellitus without complication    FH: thoracic aneurysm    Hyperlipidemia    Hypertension    Obesity     ALLERGIES:  is allergic to bee venom and other.  MEDICATIONS:  Current Outpatient Medications  Medication Sig Dispense Refill   acetaminophen (TYLENOL) 500 MG tablet Take 1,000 mg by mouth in the morning.     albuterol (PROVENTIL) (2.5 MG/3ML) 0.083% nebulizer solution Take 3 mLs (2.5 mg total) by nebulization every 6 (six) hours as needed for wheezing or shortness of breath. 75 mL 12   albuterol (VENTOLIN HFA) 108 (90 Base) MCG/ACT inhaler Inhale 2 puffs into the  lungs every 6 (six) hours as needed for wheezing or shortness of breath.     amLODipine (NORVASC) 10 MG tablet Take 10 mg by mouth daily.     atorvastatin (LIPITOR) 80 MG tablet Take 80 mg by mouth daily.     Budeson-Glycopyrrol-Formoterol (BREZTRI AEROSPHERE) 160-9-4.8 MCG/ACT AERO Inhale 2 puffs into the lungs 2 (two) times daily. Take 2 puffs first thing in am and then another 2 puffs about 12 hours later. 10.7 g 11   cetirizine (ZYRTEC) 10 MG tablet Take 10 mg by mouth 2 (two) times daily.     EPINEPHrine 0.3 mg/0.3 mL IJ SOAJ injection Inject 0.3 mg into the muscle as needed for anaphylaxis.     ezetimibe (ZETIA) 10 MG tablet Take 10 mg by mouth daily.     famotidine (PEPCID) 20 MG tablet One after supper (Patient taking differently: Take 20 mg by mouth daily after supper.) 30 tablet 11   fluticasone (FLONASE) 50 MCG/ACT nasal spray Place 2 sprays into both nostrils in the morning and at bedtime.     ipratropium-albuterol (DUONEB) 0.5-2.5 (3) MG/3ML SOLN TAKE 3 MLS BY NEBULIZATION EVERY 6 (SIX) HOURS AS NEEDED (WHEEZING AND SHORTNESS OF BREATH).     JARDIANCE 25 MG TABS tablet Take 25 mg by mouth daily.     levothyroxine (SYNTHROID) 25 MCG tablet Take 1 tablet (25 mcg total) by mouth daily before breakfast. 30 tablet 2   losartan (COZAAR) 100 MG tablet Take  100 mg by mouth daily.     metFORMIN (GLUCOPHAGE) 1000 MG tablet Take 1,000 mg by mouth 2 (two) times daily with a meal.     ONETOUCH VERIO test strip 1 each by Other route 5 (five) times daily.     pantoprazole (PROTONIX) 40 MG tablet TAKE 1 TABLET BY MOUTH DAILY. TAKE 30-60 MIN BEFORE FIRST MEAL OF THE DAY (Patient taking differently: Take 40 mg by mouth daily as needed (heartburn).) 90 tablet 2   prochlorperazine (COMPAZINE) 10 MG tablet Take 1 tablet (10 mg total) by mouth every 6 (six) hours as needed. 30 tablet 0   REPATHA 140 MG/ML SOSY Inject 140 mg into the skin every 14 (fourteen) days.     tamsulosin (FLOMAX) 0.4 MG CAPS capsule  Take 1 capsule (0.4 mg total) by mouth daily. 14 capsule 0   tirzepatide (MOUNJARO) 10 MG/0.5ML Pen Inject 10 mg into the skin once a week. (Patient taking differently: Inject 10 mg into the skin every Friday.) 6 mL 3   No current facility-administered medications for this visit.    SURGICAL HISTORY:  Past Surgical History:  Procedure Laterality Date   CYSTOSCOPY WITH URETEROSCOPY AND STENT PLACEMENT Left 02/12/2022   Procedure: CYSTOSCOPY WITH LEFT RETROGRADE PYELOGRAM, DIAGNOSTIC URETEROSCOPY AND STENT PLACEMENT;  Surgeon: Crista Elliot, MD;  Location: [facility];  Service: Urology;  Laterality: Left;   LAPAROSCOPIC NEPHRECTOMY, HAND ASSISTED Left 03/12/2022   Procedure: LEFT HAND ASSISTED LAPAROSCOPIC NEPHRECTOMY;  Surgeon: Crista Elliot, MD;  Location: [facility];  Service: Urology;  Laterality: Left;  NEED 2.5 HRS FOR THIS CASE   TONSILLECTOMY     WISDOM TOOTH EXTRACTION      REVIEW OF SYSTEMS:  Constitutional: negative Eyes: negative Ears, nose, mouth, throat, and face: negative Respiratory: negative Cardiovascular: negative Gastrointestinal: negative Genitourinary:negative Integument/breast: negative Hematologic/lymphatic: negative Musculoskeletal:negative Neurological: negative Behavioral/Psych: negative Endocrine: negative Allergic/Immunologic: negative   PHYSICAL EXAMINATION: General appearance: alert, cooperative, and no distress Head: Normocephalic, without obvious abnormality, atraumatic Neck: no adenopathy, no JVD, supple, symmetrical, trachea midline, and thyroid not enlarged, symmetric, no tenderness/mass/nodules Lymph nodes: Cervical, supraclavicular, and axillary nodes normal. Resp: clear to auscultation bilaterally Back: symmetric, no curvature. ROM normal. No CVA tenderness. Cardio: regular rate and rhythm, S1, S2 normal, no murmur, click, rub or gallop GI: soft, non-tender; bowel sounds normal; no masses,  no organomegaly Extremities: extremities normal,  atraumatic, no cyanosis or edema Neurologic: Alert and oriented X 3, normal strength and tone. Normal symmetric reflexes. Normal coordination and gait  ECOG PERFORMANCE STATUS: 0 - Asymptomatic  Blood pressure (!) 142/79, pulse 84, temperature (!) 97.2 F (36.2 C), temperature source Temporal, resp. rate 18, height  (1.778 m), weight 246 lb 6.4 oz (111.8 kg), SpO2 96 %.  LABORATORY DATA: Lab Results  Component Value Date   WBC 5.5 01/17/2023   HGB 15.3 01/17/2023   HCT 43.7 01/17/2023   MCV 93.8 01/17/2023   PLT 178 01/17/2023      Chemistry      Component Value Date/Time   NA 138 12/27/2022 0917   NA 139 07/25/2022 0935   K 4.5 12/27/2022 0917   CL 105 12/27/2022 0917   CO2 24 12/27/2022 0917   BUN 27 (H) 12/27/2022 0917   BUN 23 07/25/2022 0935   CREATININE 1.40 (H) 12/27/2022 0917      Component Value Date/Time   CALCIUM 9.9 12/27/2022 0917   ALKPHOS 60 12/27/2022 0917   AST 19 12/27/2022 0917  ALT 21 12/27/2022 0917   BILITOT 0.4 12/27/2022 1610       RADIOGRAPHIC STUDIES: CT Abdomen Pelvis Wo Contrast  Result Date: 01/15/2023 CLINICAL DATA:  Left renal cancer, status post nephrectomy, on Keytruda EXAM: CT ABDOMEN AND PELVIS WITHOUT CONTRAST TECHNIQUE: Multidetector CT imaging of the abdomen and pelvis was performed following the standard protocol without IV contrast. RADIATION DOSE REDUCTION: This exam was performed according to the departmental dose-optimization program which includes automated exposure control, adjustment of the mA and/or kV according to patient size and/or use of iterative reconstruction technique. COMPARISON:  07/17/2022 FINDINGS: Lower chest: Lung bases are clear. Hepatobiliary: Unenhanced liver is unremarkable. Gallbladder is unremarkable. No intrahepatic or extrahepatic ductal dilatation. Pancreas: Within normal limits. Spleen: Within normal limits. Adrenals/Urinary Tract: Adrenal glands are within normal limits. Status post left  nephrectomy. Right kidney is within normal limits. No renal calculi or hydronephrosis. Bladder is underdistended but unremarkable. Stomach/Bowel: Stomach normal limits. No evidence of bowel obstruction. Normal appendix (series 2/image 86). No colonic wall thickening or inflammatory changes. Vascular/Lymphatic: No evidence of abdominal aortic aneurysm. Atherosclerotic calcifications of the abdominal aorta and branch vessels. No suspicious abdominopelvic lymphadenopathy. Reproductive: Prostate is unremarkable. Other: No abdominopelvic ascites. Tiny fat containing bilateral inguinal hernias (series 2/image 87). Musculoskeletal: Mild degenerative changes of the visualized thoracolumbar spine. Bilateral pars defects at L5-S1. IMPRESSION: Status post left nephrectomy. No evidence of recurrent or metastatic disease. Electronically Signed   By: Charline Bills M.D.   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  He is status post left laparoscopic radical nephrectomy on 03/12/2022 under the care of Dr. 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 The patient is currently undergoing treatment with Adjuvant immunotherapy with Keytruda 200 Mg IV every 3 weeks status post 10 cycles. The patient has been tolerating his treatment with Keytruda fairly well. He had repeat CT scan of the abdomen and pelvis performed recently.  I personally and independently reviewed the scan and discussed the result with the patient today. His scan showed no concerning findings for disease recurrence or metastasis. I recommended for him to continue his current treatment and he will proceed with cycle #11 today. For the hypothyroidism, he is currently on  treatment with levothyroxine. For the nausea, he will continue his treatment with Compazine. For the history of COPD and shortness of breath he is followed by Dr. Sherene Sires. The patient was advised to call immediately if he has any other concerning symptoms in the interval. The patient voices understanding of current disease status and treatment options and is in agreement with the current care plan.  All questions were answered. The patient knows to call the clinic with any problems, questions or concerns. We can certainly see the patient much sooner if necessary.  The total time spent in the appointment was 30 minutes.  Disclaimer: This note was dictated with voice recognition software. Similar sounding words can inadvertently be transcribed and may not be corrected upon review.

## 2023-01-17 NOTE — Progress Notes (Signed)
Patient identified on MST at risk for malnutrition based on poor appetite and weight loss.  Patient is a 57 year old male diagnosed with renal cell cancer followed by Dr. Arbutus Ped.  PRIOR THERAPY: Status post left laparoscopic radical nephrectomy on 03/12/2022 under the care of Dr. 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   CURRENT THERAPY: Adjuvant immunotherapy with Keytruda 200 Mg IV every 3 weeks status post 10 cycles.  Past medical history includes aortic stenosis, chronic kidney disease, diabetes, hyperlipidemia, hypertension, and obesity.  Medications include Jardiance, Synthroid, metformin, Protonix, Compazine, and tirzepatide.  Labs include glucose 144, BUN 27, creatinine 1.40 on April 4.  Height: 5 feet 10 inches. Weight: 235 pounds 14 ounces on March 4 and 239 pounds 13 ounces on April 4. Patient weighed 246 pounds 6 ounces today April 25. Usual body weight: 240-245 pounds per patient. BMI: 35.35.  Patient is now currently at usual body weight.  States he does have some occasional nausea however it is controlled with antiemetics.  He drinks 1 Premier protein shake daily and drinks fair life milk with cereal.  He states his bowel pattern is normal.  His appetite is okay on tirzepatide.  No nutrition diagnosis at this time.  I encourage patient to consume smaller more frequent meals and snacks to promote weight maintenance.  Take antinausea medications as needed.  Encouraged him to contact RD if he develops problems impacting his oral intake during treatment.  **Disclaimer: This note was dictated with voice recognition software. Similar sounding words can inadvertently be transcribed and this note may contain transcription errors which may not have been corrected upon publication of note.**

## 2023-01-17 NOTE — Progress Notes (Signed)
Patient seen by Dr. Gypsy Balsam are within treatment parameters.  Labs reviewed: and are within treatment parameters.  Per physician team, patient is ready for treatment and there are NO modifications to the treatment plan. Keytruda

## 2023-01-17 NOTE — Patient Instructions (Signed)
[facility]  Discharge Instructions: Thank you for choosing [facility] to provide your oncology and hematology care.   If you have a lab appointment with the Cancer Center, please go directly to the Cancer Center and check in at the registration area.   Wear comfortable clothing and clothing appropriate for easy access to any Portacath or PICC line.   We strive to give you quality time with your provider. You may need to reschedule your appointment if you arrive late (15 or more minutes).  Arriving late affects you and other patients whose appointments are after yours.  Also, if you miss three or more appointments without notifying the office, you may be dismissed from the clinic at the provider's discretion.      For prescription refill requests, have your pharmacy contact our office and allow 72 hours for refills to be completed.    Today you received the following chemotherapy and/or immunotherapy agents: Keytruda      To help prevent nausea and vomiting after your treatment, we encourage you to take your nausea medication as directed.  BELOW ARE SYMPTOMS THAT SHOULD BE REPORTED IMMEDIATELY: *FEVER GREATER THAN 100.4 F (38 C) OR HIGHER *CHILLS OR SWEATING *NAUSEA AND VOMITING THAT IS NOT CONTROLLED WITH YOUR NAUSEA MEDICATION *UNUSUAL SHORTNESS OF BREATH *UNUSUAL BRUISING OR BLEEDING *URINARY PROBLEMS (pain or burning when urinating, or frequent urination) *BOWEL PROBLEMS (unusual diarrhea, constipation, pain near the anus) TENDERNESS IN MOUTH AND THROAT WITH OR WITHOUT PRESENCE OF ULCERS (sore throat, sores in mouth, or a toothache) UNUSUAL RASH, SWELLING OR PAIN  UNUSUAL VAGINAL DISCHARGE OR ITCHING   Items with * indicate a potential emergency and should be followed up as soon as possible or go to the Emergency Department if any problems should occur.  Please show the CHEMOTHERAPY ALERT CARD or IMMUNOTHERAPY ALERT CARD at  check-in to the Emergency Department and triage nurse.  Should you have questions after your visit or need to cancel or reschedule your appointment, please contact [facility]  Dept: [phone]  and follow the prompts.  Office hours are 8:00 a.m. to 4:30 p.m. Monday - Friday. Please note that voicemails left after 4:00 p.m. may not be returned until the following business day.  We are closed weekends and major holidays. You have access to a nurse at all times for urgent questions. Please call the main number to the clinic Dept: [phone] and follow the prompts.   For any non-urgent questions, you may also contact your provider using MyChart. We now offer e-Visits for anyone 18 and older to request care online for non-urgent symptoms. For details visit [email or web address].   Also download the MyChart app! Go to the app store, search "MyChart", open the app, select [facility], and log in with your MyChart username and password.   

## 2023-01-17 NOTE — Telephone Encounter (Signed)
er

## 2023-01-21 NOTE — Progress Notes (Unsigned)
Russell Burns, male    DOB: 11-Jun-1966   MRN: [id number]   Brief patient profile:  57  yowm never smoker seasonal rhinitis spring > fall rx drixoral  referred to pulmonary clinic 06/11/2022 by Virgina Jock for sob/ wife [name]  Has had good ex tolerance / very active up and down steps then 1st covid Dec 2020 got infusion sick 3 weeks but back to baseline then 2nd infection just sniffles oct 2022 then Jan 2023 severe coughing fits with sob mostly daytime then April  2023 dx renal cell  cancer cough syncope   > admit     Admit date: 01/01/2022 Discharge date: 01/07/2022 Acute respiratory failure with hypoxia (HCC) -Presented with hypoxia down to 88 to 89% requiring 3 L via nasal cannula.  He reports about 4 months of symptoms of nonproductive cough with exertional dyspnea and orthopnea.  CTA of the chest is negative for pulmonary embolism but showed findings suggestive of small airway disease with mucous plugging of the lower lobes.  CTA chest in January  showed airway debris bilaterally concerning for bronchitis versus aspiration.  He has no formal diagnosis of COPD and only has remote history of tobacco use 20 years ago,but reports -maternal uncle with COPD due to alpha antitrypsin deficiency. -4/11 Alpha-1 antitrypsin pending -Unclear if this is really COPD. he will need outpatient PFT   -DuoNeb QID -Trial of nebulized hypertonic saline and inhaled steroid for mucolytic.  -4/11 chest physiotherapy TID.  -daily IV Solu-Medrol 60mg  -4/12 resolved: DC Solu-Medrol - 4/12 Prednisone 50 mg x 5 days    Left Renal mass/suspicious cyst of the upper pole RIGHT kidney -4/11 review of CT scanning of abdomen 4/4 shows mass in LEFT kidney consistent with malignancy, and suspicious mass in right kidney. - 4/12 discussed case with Dr Diona Fanti Urology stated equipment required to remove kidney not in hospital.  Patient to follow-up upon discharge, to discuss  LEFT kidney resection -Follow-up with urologist  ASAP upon discharge   HLD (hyperlipidemia) Continue home meds once med rec is complete   Thoracic aortic aneurysm ([place]) -Stable 4.6 cm ascending aortic aneurysm.  See CTA PE protocol below -4/11 patient never seen cardiothoracic surgery, and has SIGNIFICANT family history of ruptured AAA in his family. - 4/12 discussed case with Dr. Roxan Hockey Cardiothoracic Surgery. patient is to follow-up with him in 6 months.     Chronic diastolic CHF/Moderate AS -Echo in 09/2021 with EF of 55 to 60% and grade 1 diastolic dysfunction. - If symptoms do not improve with pulmonary therapy, could consider consult to cardiology to discuss whether symptoms are related to his moderate aortic stenosis.     Essential hypertension -BP goal <120/80 if patient can tolerate, secondary to AAA. - Amlodipine 10 mg daily - Losartan 100 mg daily -Hydralazine PRN SBP> 130 or DBP> 90 -4/12 BP fairly well controlled on current medication   Diabetes mellitus without complication (HCC) - Discharged on home medication   Obese (BMI 34.4 kg/m) -Discuss weight loss strategies with PCP     Discharge Diagnoses:  Principal Problem:   Acute respiratory failure with hypoxia ([place])   Diabetes mellitus without complication ([place])   Essential hypertension   Thoracic aortic aneurysm (HCC)   HLD (hyperlipidemia)   Left renal mass   Chronic diastolic CHF (congestive heart failure) ([place])      History of Present Illness  06/11/2022  Pulmonary/ 1st office eval/Trampus Mcquerry  was 80% better p admit until a week p 1st use of Keytruda which  was on 05/10/22  Chief Complaint  Patient presents with   Consult    Referred by PCP for cough and increased SOB. States this has been going on since January 2023. Was in the hospital back in April 2023 for the same concern.   Dyspnea:  MMRC3 = can't walk 100 yards even at a slow pace at a flat grade s stopping due to sob   Cough: worse at hs dry with subjective wheeze  Sleep: bolt upright x 2 weeks   SABA use: anoro daily/ saba not really sure it's helping  Rec Prednisone 10 mg Take 4 for three days 3 for three days 2 for three days 1 for three days and stop  For cough > Promethazine DM 2 tsp four times daily and supplement with oxycodone 5 mg every 4 hours as needed  For tickle >  For drainage / throat tickle try take CHLORPHENIRAMINE  4 mg Prilosec  (20 x 2) 40 mg  (Pantoprazole 40 mg)   Take  30-60 min before first meal of the day and Pepcid (famotidine)  20 mg after supper until return to office - this is the best way to tell whether stomach acid is contributing to your problem.   GERD diet No more [place] or anoro Ok to use albuterol up to 2 puffs every 4 hours if you feel it helps your breathing or cough  Please schedule a follow up office visit in 2 weeks, sooner if needed- to [facility] ER in meantime if getting worse or 02 sats on RA < 90% at rest   Admit date: 06/14/2022 Discharge date: 06/19/2022   Admitted From: Home Disposition:  Home    Brief/Interim Summary:   Patient is a 57 year old male with history of renal cell carcinoma on Keytruda, thoracic aortic aneurysm, hyperlipidemia, hypertension, diabetes type 2 presented with 1 week history of shortness of breath, cough.  Patient was also recently prescribed steroids by his pulmonologist for suspicion of COPD.  On presentation ,he was hemodynamically stable.  Required 2 to 5 L of oxygen for maintenance of saturation.  Chest x-ray did not show any acute findings.  CT PE study was negative for PE but showed bilateral bronchial wall thickening, scattered bibasilar consolidation, mucous plugging.  Imaging suspicious for postobstructive pneumonia.  Patient was started on broad spectrum antibiotics, bronchodilators.  Respiratory status significantly improving.  Currently down to 3 L.  He qualified for home oxygen.  He is medically stable for discharge home today.  He will follow-up with Dr. Melvyn Novas as an outpatient.     Following problems were  addressed during his hospitalization:   Acute hypoxic respiratory failure: Presented with shortness of breath, cough.  Not on oxygen at home.  Required 2 to 5 L of oxygen on presentation.  Patient was started on short course of steroids from outpatient pulmonology without improvement. Chest x-ray did not show any acute findings.  CT PE study was negative for PE but showed bilateral bronchial wall thickening, scattered bibasilar consolidation, mucous plugging.  Imaging suspicious for postobstructive pneumonia.  Patient was started on broad spectrum antibiotics, bronchodilators.  COVID, respiratory viral panel negative.  Patient is bieng treated for renal cell carcinoma so there is possibility of pneumonitis as well.  Being work-up for diagnosis of COPD as an outpatient.  Imaging showed bronchial wall thickening.  Treated with steroids, bronchodilator.  Procalcitonin non-reassuring. He feels much better .  We requested for chest physiotherapy. Continue incentive spirometer, flutter valve.  Current down to  2 L.  He qualified for home oxygen for 3 L .  Continue prednisone on tapering dose.     Renal cell carcinoma: Currently on Keytruda, follows with Dr. Alen Blew.  Status post left nephrectomy.  Pathology showed clear-cell renal cell carcinoma.  Continue home tamsulosin   History of thoracic aortic aneurysm: Stable.  Imaging showed 4.6 cm ascending thoracic aortic aneurysm.  Semiannual follow-up with CTA/MRI recommended.  He needs to follow-up with vascular surgery as an outpatient.  He is scheduled to follow-up with Dr. Roxan Hockey in 6 months    History of diastolic CHF: Last echo showed EF of 55 to [id number], grade 1 diastolic dysfunction.  Currently not on diuretics.          Discharge Diagnoses:  Principal Problem:   Acute respiratory failure with hypoxia (HCC) Active Problems:   Diabetes mellitus without complication (HCC)   Essential hypertension   HLD (hyperlipidemia)   Chronic diastolic CHF  (congestive heart failure) ([place])   Malignant neoplasm of left kidney (HCC)   GERD (gastroesophageal reflux disease)       06/27/2022  f/u ov/Yanis Larin re: acute resp failure   maint on prn ventolin and last dose of pred 06/27/2022  Chief Complaint  Patient presents with   Follow-up    Better 94%, sob and cough-gray better   Dyspnea:  across parking lots ok with or without 02  Cough: a bit of grey mucus still mostly in am  Sleeping: recliner 20 degrees = baseline  SABA use: twice daily  02: 3lpm hs and prn  Covid status:  vax max  Rec Plan A = Automatic = Always=    Symbicort 80 Take 2 puffs first thing in am and then another 2 puffs about 12 hours later.   Work on inhaler technique:  Plan B = Backup (to supplement plan A, not to replace it) Only use your albuterol inhaler as a rescue medication  Ok to try albuterol 15 min before an activity (on alternating days)  that you know would usually make you short of breath Make sure you check your oxygen saturation  AT  your highest level of activity (not after you stop)   to be sure it stays over 90%  Ok to return to work without restrictions Jul 02 2022 but you will access to 02 and your rescue inhaler     07/25/2022  f/u ov/Aristides Luckey re: acute resp failure secondary to Keytruda pneumonitis  maint on symb 80 2bid    Chief Complaint  Patient presents with   Follow-up    Breathing is much improved. He has not had to use albuterol in the past 2 wks.   Dyspnea:  walking all over the plant, including steps Cough: resolved on h1 bid  Sleeping: on side bed flat  SABA use: not 02: not  Covid status:   vax x 4  Rec -Continue Albuterol inhaler 2 puffs or 3 mL neb every 6 hours as needed for shortness of breath or wheezing. Notify if symptoms persist despite rescue inhaler/neb use. Orders placed for nebulizer today. You should use your breathing treatments at least four times a day until symptoms improve -Continue Symbicort 2 puffs Twice daily. Brush  tongue and rinse mouth afterwards -Continue Zyrtec 1 tab daily for allergies -Continue flonase nasal spray 2 sprays each nostril daily -Continue protonix 1 tab daily  Prednisone taper. 4 tabs for 2 days, then 3 tabs for 2 days, 2 tabs for 2 days, then 1 tab for 2  days, then stop. Take in AM with food. Start tomorrow Azithromycin 2 tablets on day one then 1 tablet daily for four additional days. Take with food Guaifenesin [phone] mg Twice daily for congestion    09/14/2022  f/u ov/Arlean Thies re: ? Inhalational injury at work vs [place] toxicity maint on symbicort 80 2 bid    and no longer on 02  Chief Complaint  Patient presents with   Follow-up    SaO2 sats at home 92%.  By afternoon 94%.  Did cough up a lot of mucus on 09/12/2022 and 09/13/2022.  Breathing a lot easier now.  Dyspnea:  walking all day 18 k steps sats lower 90s  Cough: tapering off/ was very productive now min rattle  Sleeping: recliner 45 degrees  SABA use:4 pm after symb 80 7am / neb  02: none  Rec Mucinex should be 1200 mg every 12 hours and flutter valve as much as possible Please schedule a follow up visit in 3 months but call sooner if needed   12/12/2022  f/u ov/Jamyria Ozanich re: inhalational injuries ?TDI   maint on breztri since 11/30/22  / last dose of Prednisone 12/01/22  Cecilie Kicks 12/06/22 and no gerd Chief Complaint  Patient presents with   Follow-up    No c/o, using Breztri   Dyspnea:  walking fast all day at plant/  sats lowest 95% RA Cough: no Sleeping: lying flat ok  SABA use:  occ near 11th hour of breztri / neb bid ? Why (none on day of pfts which were nl  12/11/22)  02: none  Rec Plan A = Automatic = Always=    Breztri Take 2 puffs first thing in am and then another 2 puffs about 12 hours later.   Plan B = Backup (to supplement plan A, not to replace it) Only use your albuterol inhaler as a rescue medication  Plan C = Crisis (instead of Plan B but only if Plan B stops working) - only use your albuterol nebulizer if  you first try Plan B Also  Ok to try albuterol 15 min before an activity (on alternating days)  that you know would usually make you short of breath    01/23/2023  f/u ov/Kimmora Risenhoover re: ***   maint on ***  No chief complaint on file.   Dyspnea:  *** Cough: *** Sleeping: *** SABA use: *** 02: *** Covid status:   *** Lung cancer screening :  ***    No obvious day to day or daytime variability or assoc excess/ purulent sputum or mucus plugs or hemoptysis or cp or chest tightness, subjective wheeze or overt sinus or hb symptoms.   *** without nocturnal  or early am exacerbation  of respiratory  c/o's or need for noct saba. Also denies any obvious fluctuation of symptoms with weather or environmental changes or other aggravating or alleviating factors except as outlined above   No unusual exposure hx or h/o childhood pna/ asthma or knowledge of premature birth.  Current Allergies, Complete Past Medical History, Past Surgical History, Family History, and Social History were reviewed in [employer] record.  ROS  The following are not active complaints unless bolded Hoarseness, sore throat, dysphagia, dental problems, itching, sneezing,  nasal congestion or discharge of excess mucus or purulent secretions, ear ache,   fever, chills, sweats, unintended wt loss or wt gain, classically pleuritic or exertional cp,  orthopnea pnd or arm/hand swelling  or leg swelling, presyncope, palpitations, abdominal pain, anorexia, nausea, vomiting,  diarrhea  or change in bowel habits or change in bladder habits, change in stools or change in urine, dysuria, hematuria,  rash, arthralgias, visual complaints, headache, numbness, weakness or ataxia or problems with walking or coordination,  change in mood or  memory.        No outpatient medications have been marked as taking for the 01/23/23 encounter (Appointment) with Nyoka Cowden, MD.            Past Medical History:  Diagnosis Date    Allergy    Aortic stenosis    Arthritis    Blood transfusion without reported diagnosis    Cancer [facility])    Chronic kidney disease    Diabetes mellitus without complication (HCC)    FH: thoracic aneurysm    Hyperlipidemia    Hypertension    Obesity        Objective:    01/23/2023          *** 12/12/2022       246  09/14/2022     242  07/25/2022       247  06/27/2022       247   06/11/22 235 lb 6.4 oz (106.8 kg)  05/31/22 239 lb 6.4 oz (108.6 kg)  05/10/22 239 lb (108.4 kg)            Assessment

## 2023-01-23 ENCOUNTER — Encounter: Payer: Self-pay | Admitting: Internal Medicine

## 2023-01-23 ENCOUNTER — Ambulatory Visit: Payer: BC Managed Care – PPO | Admitting: Internal Medicine

## 2023-01-23 ENCOUNTER — Other Ambulatory Visit: Payer: Self-pay

## 2023-01-23 VITALS — BP 128/78 | HR 86 | Temp 98.1°F | Ht 70.0 in | Wt 242.8 lb

## 2023-01-23 DIAGNOSIS — R0609 Other forms of dyspnea: Secondary | ICD-10-CM

## 2023-01-23 DIAGNOSIS — J4489 Other specified chronic obstructive pulmonary disease: Secondary | ICD-10-CM

## 2023-01-23 NOTE — Assessment & Plan Note (Addendum)
Onset around mid May 17 2022 p Ketruda started 05/10/22 > d/c'd  05/31/22 temporarily and then restarted s flare  - 06/11/2022   Walked on RA  x  2  lap(s) =  approx 500  ft  @ nl pace, stopped due to sob/desats to 87%  But quickly recovered at rest  - 09/14/2022 reported no desats walking up to 18k steps per day   Still on [place] s evidence of adverse lung effects but needs to keep monitoring:  Advised  Make sure you check your oxygen saturation at your highest level of activity(NOT after you stop)  to be sure it stays over 90% and keep track of it at least once a week, more often if breathing getting worse, and let me know if losing ground. (Collect the dots to connect the dots approach)    F/u q 42m, sooner prn      Each maintenance medication was reviewed in detail including emphasizing most importantly the difference between maintenance and prns and under what circumstances the prns are to be triggered using an action plan format where appropriate.  Total time for H and P, chart review, counseling, reviewing hfa  device(s) and generating customized AVS unique to this office visit / same day charting = 31 min

## 2023-01-23 NOTE — Assessment & Plan Note (Addendum)
Onset p ketuda exposure in setting of long term seasonal rhinitis and TDI exp at work - Eos 1.7 06/11/2022 > rx short course prednisone only as pt is diabetic - 06/11/22    alpha one AT phenotype   MS  Level 129  - 06/27/2022    try symbicort 80 2bid and complete prednisone rx 06/27/2022   - 07/25/2022  FENO 29 on symb 80 2 bid > resumed [place] s apparent adverse effects as of 09/14/2022  -  PFTs  12/11/22 nl p am breztri p last prednisone 12/01/22  -  12/12/2022  After extensive coaching inhaler device,  effectiveness =    90% with hfa   All goals of chronic asthma control met including optimal function and elimination of symptoms with minimal need for rescue therapy.  Contingencies discussed in full including contacting this office immediately if not controlling the symptoms using the rule of two's.     No change rx / continue to avoid TCI exposures at work  F/u q 6 m

## 2023-01-23 NOTE — Patient Instructions (Signed)
No change in your medications   Make sure you check your oxygen saturation at your highest level of activity (NOT after you stop)  to be sure it stays over 90% and keep track of it at least once a week, more often if breathing getting worse, and let me know if losing ground. (Collect the dots to connect the dots approach)    Please schedule a follow up visit in 6 months but call sooner if needed

## 2023-01-24 ENCOUNTER — Other Ambulatory Visit: Payer: Self-pay

## 2023-02-06 DIAGNOSIS — R0681 Apnea, not elsewhere classified: Secondary | ICD-10-CM | POA: Diagnosis not present

## 2023-02-07 ENCOUNTER — Encounter: Payer: Self-pay | Admitting: Medical Oncology

## 2023-02-07 ENCOUNTER — Inpatient Hospital Stay: Payer: BC Managed Care – PPO

## 2023-02-07 ENCOUNTER — Inpatient Hospital Stay: Payer: BC Managed Care – PPO | Attending: Oncology

## 2023-02-07 ENCOUNTER — Inpatient Hospital Stay (HOSPITAL_BASED_OUTPATIENT_CLINIC_OR_DEPARTMENT_OTHER): Payer: BC Managed Care – PPO | Admitting: Internal Medicine

## 2023-02-07 VITALS — BP 129/63 | HR 73 | Resp 16

## 2023-02-07 DIAGNOSIS — C642 Malignant neoplasm of left kidney, except renal pelvis: Secondary | ICD-10-CM | POA: Insufficient documentation

## 2023-02-07 DIAGNOSIS — Z905 Acquired absence of kidney: Secondary | ICD-10-CM | POA: Insufficient documentation

## 2023-02-07 DIAGNOSIS — R0681 Apnea, not elsewhere classified: Secondary | ICD-10-CM | POA: Diagnosis not present

## 2023-02-07 DIAGNOSIS — E039 Hypothyroidism, unspecified: Secondary | ICD-10-CM | POA: Insufficient documentation

## 2023-02-07 DIAGNOSIS — Z5112 Encounter for antineoplastic immunotherapy: Secondary | ICD-10-CM | POA: Insufficient documentation

## 2023-02-07 DIAGNOSIS — Z79890 Hormone replacement therapy (postmenopausal): Secondary | ICD-10-CM | POA: Insufficient documentation

## 2023-02-07 DIAGNOSIS — Z79620 Long term (current) use of immunosuppressive biologic: Secondary | ICD-10-CM | POA: Insufficient documentation

## 2023-02-07 LAB — CBC WITH DIFFERENTIAL (CANCER CENTER ONLY)
Abs Immature Granulocytes: 0.01 10*3/uL (ref 0.00–0.07)
Basophils Absolute: 0.1 10*3/uL (ref 0.0–0.1)
Basophils Relative: 1 %
Eosinophils Absolute: 0.4 10*3/uL (ref 0.0–0.5)
Eosinophils Relative: 9 %
HCT: 45 % (ref 39.0–52.0)
Hemoglobin: 15.3 g/dL (ref 13.0–17.0)
Immature Granulocytes: 0 %
Lymphocytes Relative: 26 %
Lymphs Abs: 1.1 10*3/uL (ref 0.7–4.0)
MCH: 32 pg (ref 26.0–34.0)
MCHC: 34 g/dL (ref 30.0–36.0)
MCV: 94.1 fL (ref 80.0–100.0)
Monocytes Absolute: 0.5 10*3/uL (ref 0.1–1.0)
Monocytes Relative: 11 %
Neutro Abs: 2.3 10*3/uL (ref 1.7–7.7)
Neutrophils Relative %: 53 %
Platelet Count: 228 10*3/uL (ref 150–400)
RBC: 4.78 MIL/uL (ref 4.22–5.81)
RDW: 12.2 % (ref 11.5–15.5)
WBC Count: 4.4 10*3/uL (ref 4.0–10.5)
nRBC: 0 % (ref 0.0–0.2)

## 2023-02-07 LAB — CMP (CANCER CENTER ONLY)
ALT: 21 U/L (ref 0–44)
AST: 17 U/L (ref 15–41)
Albumin: 4.5 g/dL (ref 3.5–5.0)
Alkaline Phosphatase: 62 U/L (ref 38–126)
Anion gap: 7 (ref 5–15)
BUN: 20 mg/dL (ref 6–20)
CO2: 25 mmol/L (ref 22–32)
Calcium: 9 mg/dL (ref 8.9–10.3)
Chloride: 102 mmol/L (ref 98–111)
Creatinine: 1.28 mg/dL — ABNORMAL HIGH (ref 0.61–1.24)
GFR, Estimated: >60 mL/min (ref >60)
Glucose, Bld: 141 mg/dL — ABNORMAL HIGH (ref 70–99)
Potassium: 4.4 mmol/L (ref 3.5–5.1)
Sodium: 134 mmol/L — ABNORMAL LOW (ref 135–145)
Total Bilirubin: 0.4 mg/dL (ref 0.3–1.2)
Total Protein: 7.2 g/dL (ref 6.5–8.1)

## 2023-02-07 LAB — TSH: TSH: 4.597 u[IU]/mL — ABNORMAL HIGH (ref 0.350–4.500)

## 2023-02-07 MED ORDER — SODIUM CHLORIDE 0.9 % IV SOLN: Freq: Once | INTRAVENOUS | Status: AC

## 2023-02-07 MED ORDER — SODIUM CHLORIDE 0.9 % IV SOLN
200.0000 mg | Freq: Once | INTRAVENOUS | Status: AC
  Administered 2023-02-07: 200 mg via INTRAVENOUS
  Filled 2023-02-07: qty 200

## 2023-02-07 NOTE — Progress Notes (Signed)
[facility] [phone]   Fax:([phone]  OFFICE PROGRESS NOTE  Russell Corn, MD [address] 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  PRIOR THERAPY: Status post left laparoscopic radical nephrectomy on 03/12/2022 under the care of Dr. 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  CURRENT THERAPY: Adjuvant immunotherapy with Keytruda 200 Mg IV every 3 weeks status post 11 cycles.  INTERVAL HISTORY: Russell Burns 57 y.o. male returns to the clinic today for follow-up visit.  The patient is feeling fine today with no concerning complaints.  He denied having any current chest pain, shortness of breath, cough or hemoptysis.  He has no nausea, vomiting, diarrhea or constipation.  He has no headache or visual changes.  He denied having any recent weight loss or night sweats.  He continues to tolerate his treatment with Keytruda fairly well.  He is here for evaluation before starting cycle #12.  MEDICAL HISTORY: Past Medical History:  Diagnosis Date   Allergy    Aortic stenosis    Arthritis    Blood transfusion without reported diagnosis    Cancer (HCC)    Chronic kidney disease    Diabetes mellitus without complication (HCC)    FH: thoracic aneurysm    Hyperlipidemia    Hypertension    Obesity     ALLERGIES:  is allergic to bee venom and other.  MEDICATIONS:  Current Outpatient Medications  Medication Sig Dispense Refill   acetaminophen (TYLENOL) 500 MG tablet Take 1,000 mg by mouth in the morning.     albuterol (PROVENTIL) (2.5 MG/3ML) 0.083% nebulizer solution Take 3 mLs (2.5 mg total) by nebulization every 6 (six) hours as needed for wheezing or shortness of breath. (Patient not taking: Reported on 01/23/2023) 75 mL 12    albuterol (VENTOLIN HFA) 108 (90 Base) MCG/ACT inhaler Inhale 2 puffs into the lungs every 6 (six) hours as needed for wheezing or shortness of breath.     amLODipine (NORVASC) 10 MG tablet Take 10 mg by mouth daily.     atorvastatin (LIPITOR) 80 MG tablet Take 80 mg by mouth daily.     Budeson-Glycopyrrol-Formoterol (BREZTRI AEROSPHERE) 160-9-4.8 MCG/ACT AERO Inhale 2 puffs into the lungs 2 (two) times daily. Take 2 puffs first thing in am and then another 2 puffs about 12 hours later. 10.7 g 11   cetirizine (ZYRTEC) 10 MG tablet Take 10 mg by mouth 2 (two) times daily.     EPINEPHrine 0.3 mg/0.3 mL IJ SOAJ injection Inject 0.3 mg into the muscle as needed for anaphylaxis.     ezetimibe (ZETIA) 10 MG tablet Take 10 mg by mouth daily.     famotidine (PEPCID) 20 MG tablet One after supper (Patient taking differently: Take 20 mg by mouth daily after supper.) 30 tablet 11   fluticasone (FLONASE) 50 MCG/ACT nasal spray Place 2 sprays into both nostrils in the morning and at bedtime.     ipratropium-albuterol (DUONEB) 0.5-2.5 (3) MG/3ML SOLN TAKE 3 MLS BY NEBULIZATION EVERY 6 (SIX) HOURS AS NEEDED (WHEEZING AND SHORTNESS OF BREATH). (Patient not taking: Reported on 01/23/2023)     JARDIANCE 25 MG TABS tablet Take 25 mg by mouth daily.     levothyroxine (SYNTHROID) 25 MCG tablet Take 1 tablet (25 mcg  total) by mouth daily before breakfast. 30 tablet 2   losartan (COZAAR) 100 MG tablet Take 100 mg by mouth daily.     metFORMIN (GLUCOPHAGE) 1000 MG tablet Take 1,000 mg by mouth 2 (two) times daily with a meal.     ONETOUCH VERIO test strip 1 each by Other route 5 (five) times daily.     pantoprazole (PROTONIX) 40 MG tablet TAKE 1 TABLET BY MOUTH DAILY. TAKE 30-60 MIN BEFORE FIRST MEAL OF THE DAY (Patient taking differently: Take 40 mg by mouth daily as needed (heartburn).) 90 tablet 2   prochlorperazine (COMPAZINE) 10 MG tablet Take 1 tablet (10 mg total) by mouth every 6 (six) hours as needed. 30 tablet 0    REPATHA 140 MG/ML SOSY Inject 140 mg into the skin every 14 (fourteen) days.     tamsulosin (FLOMAX) 0.4 MG CAPS capsule Take 1 capsule (0.4 mg total) by mouth daily. 14 capsule 0   tirzepatide (MOUNJARO) 10 MG/0.5ML Pen Inject 10 mg into the skin once a week. (Patient taking differently: Inject 10 mg into the skin every Friday.) 6 mL 3   No current facility-administered medications for this visit.    SURGICAL HISTORY:  Past Surgical History:  Procedure Laterality Date   CYSTOSCOPY WITH URETEROSCOPY AND STENT PLACEMENT Left 02/12/2022   Procedure: CYSTOSCOPY WITH LEFT RETROGRADE PYELOGRAM, DIAGNOSTIC URETEROSCOPY AND STENT PLACEMENT;  Surgeon: Crista Elliot, MD;  Location: [facility];  Service: Urology;  Laterality: Left;   LAPAROSCOPIC NEPHRECTOMY, HAND ASSISTED Left 03/12/2022   Procedure: LEFT HAND ASSISTED LAPAROSCOPIC NEPHRECTOMY;  Surgeon: Crista Elliot, MD;  Location: [facility];  Service: Urology;  Laterality: Left;  NEED 2.5 HRS FOR THIS CASE   TONSILLECTOMY     WISDOM TOOTH EXTRACTION      REVIEW OF SYSTEMS:  A comprehensive review of systems was negative.   PHYSICAL EXAMINATION: General appearance: alert, cooperative, and no distress Head: Normocephalic, without obvious abnormality, atraumatic Neck: no adenopathy, no JVD, supple, symmetrical, trachea midline, and thyroid not enlarged, symmetric, no tenderness/mass/nodules Lymph nodes: Cervical, supraclavicular, and axillary nodes normal. Resp: clear to auscultation bilaterally Back: symmetric, no curvature. ROM normal. No CVA tenderness. Cardio: regular rate and rhythm, S1, S2 normal, no murmur, click, rub or gallop GI: soft, non-tender; bowel sounds normal; no masses,  no organomegaly Extremities: extremities normal, atraumatic, no cyanosis or edema  ECOG PERFORMANCE STATUS: 0 - Asymptomatic  Blood pressure 131/85, pulse 76, temperature (!) 97.5 F (36.4 C), temperature source Temporal, resp. rate 16, weight 242 lb 9.6  oz (110 kg), SpO2 96 %.  LABORATORY DATA: Lab Results  Component Value Date   WBC 4.4 02/07/2023   HGB 15.3 02/07/2023   HCT 45.0 02/07/2023   MCV 94.1 02/07/2023   PLT 228 02/07/2023      Chemistry      Component Value Date/Time   NA 134 (L) 02/07/2023 1015   NA 139 07/25/2022 0935   K 4.4 02/07/2023 1015   CL 102 02/07/2023 1015   CO2 25 02/07/2023 1015   BUN 20 02/07/2023 1015   BUN 23 07/25/2022 0935   CREATININE 1.28 (H) 02/07/2023 1015      Component Value Date/Time   CALCIUM 9.0 02/07/2023 1015   ALKPHOS 62 02/07/2023 1015   AST 17 02/07/2023 1015   ALT 21 02/07/2023 1015   BILITOT 0.4 02/07/2023 1015       RADIOGRAPHIC STUDIES: CT Abdomen Pelvis Wo Contrast  Result Date: 01/15/2023 CLINICAL DATA:  Left renal cancer, status post nephrectomy, on Keytruda EXAM: CT ABDOMEN AND PELVIS WITHOUT CONTRAST TECHNIQUE: Multidetector CT imaging of the abdomen and pelvis was performed following the standard protocol without IV contrast. RADIATION DOSE REDUCTION: This exam was performed according to the departmental dose-optimization program which includes automated exposure control, adjustment of the mA and/or kV according to patient size and/or use of iterative reconstruction technique. COMPARISON:  07/17/2022 FINDINGS: Lower chest: Lung bases are clear. Hepatobiliary: Unenhanced liver is unremarkable. Gallbladder is unremarkable. No intrahepatic or extrahepatic ductal dilatation. Pancreas: Within normal limits. Spleen: Within normal limits. Adrenals/Urinary Tract: Adrenal glands are within normal limits. Status post left nephrectomy. Right kidney is within normal limits. No renal calculi or hydronephrosis. Bladder is underdistended but unremarkable. Stomach/Bowel: Stomach normal limits. No evidence of bowel obstruction. Normal appendix (series 2/image 86). No colonic wall thickening or inflammatory changes. Vascular/Lymphatic: No evidence of abdominal aortic aneurysm. Atherosclerotic  calcifications of the abdominal aorta and branch vessels. No suspicious abdominopelvic lymphadenopathy. Reproductive: Prostate is unremarkable. Other: No abdominopelvic ascites. Tiny fat containing bilateral inguinal hernias (series 2/image 87). Musculoskeletal: Mild degenerative changes of the visualized thoracolumbar spine. Bilateral pars defects at L5-S1. IMPRESSION: Status post left nephrectomy. No evidence of recurrent or metastatic disease. Electronically Signed   By: Charline Bills M.D.   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  He is status post left laparoscopic radical nephrectomy on 03/12/2022 under the care of Dr. 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 The patient is currently undergoing treatment with Adjuvant immunotherapy with Keytruda 200 Mg IV every 3 weeks status post 11 cycles. The patient has been tolerating this treatment well with no concerning adverse effects. I recommended for him to proceed with cycle #12 today as planned. I will see him back for follow-up visit in 3 weeks for evaluation before the next cycle of his treatment. For the hypothyroidism, he is currently on treatment with levothyroxine. For the nausea, he will continue his treatment with Compazine. For the history of COPD and shortness of breath he is followed by Dr. Sherene Sires. The patient was advised to call immediately if he has any other concerning symptoms in the interval. The patient voices understanding of current disease status and treatment options and is in agreement with the current care plan.  All questions were answered. The patient knows to call the clinic with any problems, questions or concerns. We can certainly  see the patient much sooner if necessary.  The total time spent in the appointment was 20 minutes.  Disclaimer: This note was dictated with voice recognition software. Similar sounding words can inadvertently be transcribed and may not be corrected upon review.

## 2023-02-07 NOTE — Progress Notes (Signed)
Patient seen by Dr. Mohamed  Vitals are within treatment parameters.  Labs reviewed: and are within treatment parameters.  Per physician team, patient is ready for treatment and there are NO modifications to the treatment plan.  

## 2023-02-07 NOTE — Patient Instructions (Signed)
[facility]  Discharge Instructions: Thank you for choosing [facility] to provide your oncology and hematology care.   If you have a lab appointment with the Cancer Center, please go directly to the Cancer Center and check in at the registration area.   Wear comfortable clothing and clothing appropriate for easy access to any Portacath or PICC line.   We strive to give you quality time with your provider. You may need to reschedule your appointment if you arrive late (15 or more minutes).  Arriving late affects you and other patients whose appointments are after yours.  Also, if you miss three or more appointments without notifying the office, you may be dismissed from the clinic at the provider's discretion.      For prescription refill requests, have your pharmacy contact our office and allow 72 hours for refills to be completed.    Today you received the following chemotherapy and/or immunotherapy agents: Keytruda      To help prevent nausea and vomiting after your treatment, we encourage you to take your nausea medication as directed.  BELOW ARE SYMPTOMS THAT SHOULD BE REPORTED IMMEDIATELY: *FEVER GREATER THAN 100.4 F (38 C) OR HIGHER *CHILLS OR SWEATING *NAUSEA AND VOMITING THAT IS NOT CONTROLLED WITH YOUR NAUSEA MEDICATION *UNUSUAL SHORTNESS OF BREATH *UNUSUAL BRUISING OR BLEEDING *URINARY PROBLEMS (pain or burning when urinating, or frequent urination) *BOWEL PROBLEMS (unusual diarrhea, constipation, pain near the anus) TENDERNESS IN MOUTH AND THROAT WITH OR WITHOUT PRESENCE OF ULCERS (sore throat, sores in mouth, or a toothache) UNUSUAL RASH, SWELLING OR PAIN  UNUSUAL VAGINAL DISCHARGE OR ITCHING   Items with * indicate a potential emergency and should be followed up as soon as possible or go to the Emergency Department if any problems should occur.  Please show the CHEMOTHERAPY ALERT CARD or IMMUNOTHERAPY ALERT CARD at  check-in to the Emergency Department and triage nurse.  Should you have questions after your visit or need to cancel or reschedule your appointment, please contact [facility]  Dept: [phone]  and follow the prompts.  Office hours are 8:00 a.m. to 4:30 p.m. Monday - Friday. Please note that voicemails left after 4:00 p.m. may not be returned until the following business day.  We are closed weekends and major holidays. You have access to a nurse at all times for urgent questions. Please call the main number to the clinic Dept: [phone] and follow the prompts.   For any non-urgent questions, you may also contact your provider using MyChart. We now offer e-Visits for anyone 18 and older to request care online for non-urgent symptoms. For details visit [email or web address].   Also download the MyChart app! Go to the app store, search "MyChart", open the app, select [facility], and log in with your MyChart username and password.   

## 2023-02-08 ENCOUNTER — Encounter: Payer: Self-pay | Admitting: Cardiology

## 2023-02-08 ENCOUNTER — Ambulatory Visit: Payer: BC Managed Care – PPO | Attending: Cardiology | Admitting: Cardiology

## 2023-02-08 VITALS — BP 118/84 | HR 87 | Ht 70.0 in | Wt 240.4 lb

## 2023-02-08 DIAGNOSIS — I712 Thoracic aortic aneurysm, without rupture, unspecified: Secondary | ICD-10-CM

## 2023-02-08 DIAGNOSIS — C642 Malignant neoplasm of left kidney, except renal pelvis: Secondary | ICD-10-CM

## 2023-02-08 DIAGNOSIS — I2510 Atherosclerotic heart disease of native coronary artery without angina pectoris: Secondary | ICD-10-CM

## 2023-02-08 NOTE — Progress Notes (Signed)
Cardiology Office Note:    Date:  02/08/2023   ID:  [name], DOB 12-19-1965, MRN [id number]  PCP:  [name], MD  [facility] Cardiologist:  Donato Schultz, MD  [facility] Electrophysiologist:  None   Referring MD: [name], MD     History of Present Illness:    Russell Burns is a 57 y.o. male here for follow-up of dilated ascending aorta, 45 mm, coronary artery disease with elevated coronary artery calcium score, status post nephrectomy 2023, wife, Victorino Dike diagnosed with glioblastoma in [place] on vacation.  1 year postop, clear scan.  On 12/2021, he developed a pulmonary infection and was in the hospital for 4 days.  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  A pharmacologic stress test in the setting of coronary calcium score of 405 was performed at prior visit which was low risk without  any significant evidence of ischemia. His echocardiogram resulted with normal pump function, it couldn't be excluded the possibility of bicuspid aortic valve.  He has been continuing medication regimens and has been doing well well on them.  He noted that he lost 30lbs from taking Mounjaro for his T2 diabetes.  He discussed that his left laparoscopic nephrectomy was successful.  A couple of weeks ago, he underwent a CT scan on his spine which concluded spinal stenosis.  He noted an episode where he underwent respiratory distress and had to be hospitalized, which he attributed to the toxins he uses at his work place.   Past Medical History:  Diagnosis Date   Allergy    Aortic stenosis    Arthritis    Blood transfusion without reported diagnosis    Cancer (HCC)    Chronic kidney disease    Diabetes mellitus without complication (HCC)    FH: thoracic aneurysm    Hyperlipidemia    Hypertension    Obesity     Past Surgical History:  Procedure Laterality Date   CYSTOSCOPY WITH URETEROSCOPY AND STENT  PLACEMENT Left 02/12/2022   Procedure: CYSTOSCOPY WITH LEFT RETROGRADE PYELOGRAM, DIAGNOSTIC URETEROSCOPY AND STENT PLACEMENT;  Surgeon: Crista Elliot, MD;  Location: [facility];  Service: Urology;  Laterality: Left;   LAPAROSCOPIC NEPHRECTOMY, HAND ASSISTED Left 03/12/2022   Procedure: LEFT HAND ASSISTED LAPAROSCOPIC NEPHRECTOMY;  Surgeon: Crista Elliot, MD;  Location: [facility];  Service: Urology;  Laterality: Left;  NEED 2.5 HRS FOR THIS CASE   TONSILLECTOMY     WISDOM TOOTH EXTRACTION      Current Medications: Current Meds  Medication Sig   acetaminophen (TYLENOL) 500 MG tablet Take 1,000 mg by mouth in the morning.   albuterol (PROVENTIL) (2.5 MG/3ML) 0.083% nebulizer solution Take 3 mLs (2.5 mg total) by nebulization every 6 (six) hours as needed for wheezing or shortness of breath.   albuterol (VENTOLIN HFA) 108 (90 Base) MCG/ACT inhaler Inhale 2 puffs into the lungs every 6 (six) hours as needed for wheezing or shortness of breath.   amLODipine (NORVASC) 10 MG tablet Take 10 mg by mouth daily.   atorvastatin (LIPITOR) 80 MG tablet Take 80 mg by mouth daily.   Budeson-Glycopyrrol-Formoterol (BREZTRI AEROSPHERE) 160-9-4.8 MCG/ACT AERO Inhale 2 puffs into the lungs 2 (two) times daily. Take 2 puffs first thing in am and then another 2 puffs about 12 hours later.   cetirizine (ZYRTEC) 10 MG tablet Take 10 mg by mouth 2 (two) times daily.  EPINEPHrine 0.3 mg/0.3 mL IJ SOAJ injection Inject 0.3 mg into the muscle as needed for anaphylaxis.   ezetimibe (ZETIA) 10 MG tablet Take 10 mg by mouth daily.   famotidine (PEPCID) 20 MG tablet One after supper (Patient taking differently: Take 20 mg by mouth daily after supper.)   fluticasone (FLONASE) 50 MCG/ACT nasal spray Place 2 sprays into both nostrils in the morning and at bedtime.   ipratropium-albuterol (DUONEB) 0.5-2.5 (3) MG/3ML SOLN    JARDIANCE 25 MG TABS tablet Take 25 mg by mouth daily.   levothyroxine (SYNTHROID) 25 MCG tablet Take 1  tablet (25 mcg total) by mouth daily before breakfast.   losartan (COZAAR) 100 MG tablet Take 100 mg by mouth daily.   metFORMIN (GLUCOPHAGE) 1000 MG tablet Take 1,000 mg by mouth 2 (two) times daily with a meal.   ONETOUCH VERIO test strip 1 each by Other route 5 (five) times daily.   pantoprazole (PROTONIX) 40 MG tablet TAKE 1 TABLET BY MOUTH DAILY. TAKE 30-60 MIN BEFORE FIRST MEAL OF THE DAY (Patient taking differently: Take 40 mg by mouth daily as needed (heartburn).)   prochlorperazine (COMPAZINE) 10 MG tablet Take 1 tablet (10 mg total) by mouth every 6 (six) hours as needed.   REPATHA 140 MG/ML SOSY Inject 140 mg into the skin every 14 (fourteen) days.   tamsulosin (FLOMAX) 0.4 MG CAPS capsule Take 1 capsule (0.4 mg total) by mouth daily.   tirzepatide (MOUNJARO) 10 MG/0.5ML Pen Inject 10 mg into the skin once a week. (Patient taking differently: Inject 10 mg into the skin every Friday.)     Allergies:   Bee venom and Other   Social History   Socioeconomic History   Marital status: Married    Spouse name: Not on file   Number of children: 2   Years of education: Not on file   Highest education level: Not on file  Occupational History   Not on file  Tobacco Use   Smoking status: Never   Smokeless tobacco: Current    Types: Chew  Vaping Use   Vaping Use: Never used  Substance and Sexual Activity   Alcohol use: Yes    Comment: 3 drinks a week.    Drug use: No   Sexual activity: Yes  Other Topics Concern   Not on file  Social History Narrative   Not on file   Social Determinants of Health   Financial Resource Strain: Not on file  Food Insecurity: No Food Insecurity (11/26/2022)   Hunger Vital Sign    Worried About Running Out of Food in the Last Year: Never true    Ran Out of Food in the Last Year: Never true  Transportation Needs: No Transportation Needs (11/26/2022)   PRAPARE - Administrator, Civil Service (Medical): No    Lack of Transportation  (Non-Medical): No  Physical Activity: Not on file  Stress: Not on file  Social Connections: Not on file     Family History: The patient's family history includes Aneurysm in his maternal grandfather and maternal uncle; Aneurysm (age of onset: 42) in his cousin; CAD in an other family member; Diabetes Mellitus II in his father and another family member; Heart attack in his father; Heart attack (age of onset: 56) in his paternal grandfather; Heart attack (age of onset: 71) in his paternal uncle; Heart attack (age of onset: 72) in his maternal grandmother; Heart failure in an other family member; Hypertension in his mother, paternal  uncle, and another family member. There is no history of Colon polyps, Esophageal cancer, Rectal cancer, or Stomach cancer.  ROS:   Please see the history of present illness.     All other systems reviewed and are negative.  EKGs/Labs/Other Studies Reviewed:    The following studies were reviewed today:  CTA chest 11/26/22: Ao -4.5 cm unchanged  CT Chest 07/05/2022: IMPRESSION: There is no evidence of pulmonary artery embolism. There is no evidence of thoracic aortic dissection. There is 4.5 cm aneurism in ascending thoracic aorta with no significant change.   There is no focal pulmonary consolidation. There is no pleural effusion or pneumothorax.   There is 4.6 cm low-density structure inseparable from the upper aspect of left psoas muscle. This finding is not fully evaluated. Differential diagnostic possibilities would include neoplastic process in left adrenal or postoperative changes in left renal fossa following left nephrectomy. Follow-up CT abdomen and pelvis should be considered for further characterization.   Hyperdense structures are noted in the posterior aspects of bodies of multiple thoracic vertebrae. This finding may be due to abnormal vascular enhancement or sclerotic metastatic disease. There are tortuous collateral vessels in lower  neck, superior mediastinum and paraspinal regions. Possibility of narrowing or occlusion of venous structures not included in the area examination is not excluded. As far as seen, superior vena cava appears patent.  MRI Abdomen 02/01/2022: IMPRESSION: 1. Large infiltrative mass in the lower pole of the left kidney with extension into the left renal hilum involving both the left renal collecting system and proximal aspect of the left renal vein. Overall, the mass is encapsulated within Gerota's fascia and is not associated with lymphadenopathy or definite signs of metastatic disease in the abdomen. Left renal vein involvement is proximal (approximately 5.7 cm from the midline at this time). Urologic consultation for surgical resection is recommended. 2. Bosniak class 2 cyst in the upper pole of the left kidney incidentally noted. 3. Heterogeneous hepatic steatosis.  CTA Chest 01/01/2022: FINDINGS: Cardiovascular: There are no filling defects within the pulmonary arteries to suggest pulmonary embolus. Stable 4.6 cm ascending aortic aneurysm without dissection or acute aortic findings. This causes mild mass effect on the superior vena cava. Conventional branching pattern from the aortic arch. Aortic valve calcifications again seen. There is similar contrast refluxing into the azygous and hemi azygous system as well as thoracic venous plexus. The heart is normal in size. No pericardial effusion. There are coronary artery calcifications.   Mediastinum/Nodes: Scattered small mediastinal lymph nodes, not enlarged by size criteria. No hilar or axillary adenopathy. Patulous esophagus without wall thickening.   Lungs/Pleura: Bronchial thickening with areas of mucous plugging in the lower lobes. Heterogeneous pulmonary parenchyma. 2-3 mm right perifissural lung nodule, series 7, image 61, is stable from 08/29/2020 chest CT and considered benign, consistent with intrapulmonary lymph node. No  confluent consolidation. Minor subsegmental atelectasis in the dependent lower lobes. No pleural fluid. No features of pulmonary edema   Upper Abdomen: No acute or unexpected findings.   Musculoskeletal: There are no acute or suspicious osseous abnormalities.   Review of the MIP images confirms the above findings.   IMPRESSION: 1. No pulmonary embolus. 2. Bronchial thickening with areas of mucous plugging in the lower lobes. Heterogeneous pulmonary parenchyma, suggesting small airways disease. 3. Stable 4.6 cm ascending aortic aneurysm. No dissection or acute aortic findings. Ascending thoracic aortic aneurysm. Recommend semi-annual imaging followup by CTA or MRA and referral to cardiothoracic surgery if not already obtained. This recommendation follows  2010 ACCF/AHA/AATS/ACR/ASA/SCA/SCAI/SIR/STS/SVM Guidelines for the Diagnosis and Management of Patients With Thoracic Aortic Disease. Circulation. 2010; 121: J478-G956. Aortic aneurysm NOS (ICD10-I71.9) 4. Coronary artery calcifications.   Aortic Atherosclerosis (ICD10-I70.0).  Cardiac/Coronary CTA  10/26/2021: FINDINGS: Image quality: Average.   Noise artifact is: Limited.   Coronary Arteries:  Normal coronary origin.  Right dominance.   Left main: The left main is a large caliber vessel with a normal take off from the left coronary cusp that bifurcates to form a left anterior descending artery and a left circumflex artery. There is no plaque or stenosis.   Left anterior descending artery: The LAD has minimal (0-24) calcified plaque in the proximal vessel. The LAD gives off 2 small patent diagonal branches.   Left circumflex artery: The LCX is non-dominant with minimal (0-24) calcified plaque in the proximal, mid and distal vessel. The LCX gives off 1 large, branching OM with mild (25-49) calcified stenosis in the proximal vessel and mimimal (0-24)calcified plaque in the OM branches. The Lcx terminates in small  posterolateral branches.   Right coronary artery: Technically suboptimal. The RCA is dominant with normal take off from the right coronary cusp. There is no evidence of plaque or stenosis. The RCA terminates as a PDA and right posterolateral branch without evidence of plaque or stenosis.   Right Atrium: Right atrial size is within normal limits.   Right Ventricle: The right ventricular cavity is within normal limits.   Left Atrium: Left atrial size is normal in size with no left atrial appendage filling defect.   Left Ventricle: The ventricular cavity size is within normal limits. There are no stigmata of prior infarction. There is no abnormal filling defect.   Pulmonary arteries: Normal in size.   Pulmonary veins: Normal pulmonary venous drainage.   Pericardium: Normal thickness with no significant effusion or calcium present.   Cardiac valves: The aortic valve is bicuspid with Ca score 462. The mitral valve is normal structure without significant calcification.   Aorta: Dilated aortic root (44 mm); aortic atherosclerosis.   Extra-cardiac findings: See attached radiology report for non-cardiac structures.   IMPRESSION: 1. Coronary calcium score of 530. This was 970 percentile for age-, sex, and race-matched controls.   2. Normal coronary origin with right dominance.   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   4. Aortic atherosclerosis.   5. Dilated ascending aorta (44 mm).   6. Bicuspid aortic valve   4. Aortic valve Ca score 462.  Echo 09/29/2021:  1. Left ventricular ejection fraction, by estimation, is 55 to 60%. The  left ventricle has normal function. The left ventricle has no regional  wall motion abnormalities. There is mild asymmetric left ventricular  hypertrophy of the basal-septal segment.  Left ventricular diastolic parameters are consistent with Grade I  diastolic dysfunction (impaired relaxation).   2. Right ventricular systolic function is  normal. The right ventricular  size is normal.   3. Left atrial size was mild to moderately dilated.   4. The mitral valve is normal in structure. Trivial mitral valve  regurgitation.   5. The aortic valve was not well visualized. There is moderate  calcification of the aortic valve. There is moderate thickening of the  aortic valve. Aortic valve regurgitation is trivial. Moderate aortic valve  stenosis. AVA 1.12cm2, mean gradient ,   peak gradient , Vmax 3.39m/s, DI 0.33.   6. Aortic dilatation noted. There is borderline dilatation of the  ascending aorta, measuring 36 mm.   Comparison(s): Compared to  prior TTE in 09/30/20, the degree of AS has  progressed. Prior mean gradient , Vmax 2.109m/s.   Echo 09/30/2020:  1. Left ventricular ejection fraction, by estimation, is 60 to 65%. The  left ventricle has normal function. The left ventricle has no regional  wall motion abnormalities. Left ventricular diastolic parameters are  consistent with Grade I diastolic  dysfunction (impaired relaxation).   2. Right ventricular systolic function is normal. The right ventricular  size is normal.   3. The mitral valve is normal in structure. Trivial mitral valve  regurgitation. No evidence of mitral stenosis.   4. The aortic vavle is poorly visualizd. It appears to be at least  moderately calcified and restricted. Cannot rule out bicuspid aortic  valve. The aortic valve is calcified. There is moderate calcification of  the aortic valve. There is moderate  thickening of the aortic valve. Aortic valve regurgitation is not  visualized. Mild to moderate aortic valve stenosis. Aortic valve area, by  VTI measures 2.00 cm. Aortic valve mean gradient measures 19.0 mmHg.  Aortic valve Vmax measures 2.75 m/s.   5. The inferior vena cava is normal in size with <50% respiratory  variability, suggesting right atrial pressure of 8 mmHg.    NUC stress 09/30/20:  The left ventricular ejection  fraction is normal (55-65%). Nuclear stress EF: 55%. There was no ST segment deviation noted during stress. No T wave inversion was noted during stress. The study is normal. This is a low risk study.   1.  There are reduced counts in the apex on rest and stress imaging with normal wall motion consistent with apical thinning artifact.  Overall, this is a normal study without evidence of ischemia or prior infarction. 2.  Normal LVEF, 55%. 3.  This is a low risk study.   CT calcium score 08/29/20:  Left Main: 0   LAD: 144   LCx: 262   RCA: 0   Total Agatston Score: 405   MESA database percentile: 96   AORTA MEASUREMENTS:   Ascending Aorta: 45 mm   Descending Aorta: 26 mm    EKG:  EKG is personally reviewed. 06/15/2022: Sinus tachycardia. Rate 99 bpm. Left anterior fascicular block. 02/07/2022:  Sinus rhythm. Rate 69 bpm. Left axis deviation. 08/31/2020 --sinus rhythm 75 borderline conduction delay   Recent Labs: 06/11/2022: Pro B Natriuretic peptide (BNP) 43.0 11/26/2022: B Natriuretic Peptide 20.7 02/07/2023: ALT 21; BUN 20; Creatinine 1.28; Hemoglobin 15.3; Platelet Count 228; Potassium 4.4; Sodium 134; TSH 4.597   Recent Lipid Panel No results found for: "CHOL", "TRIG", "HDL", "CHOLHDL", "VLDL", "LDLCALC", "LDLDIRECT"   Risk Assessment/Calculations:      Physical Exam:    VS:  BP 118/84   Pulse 87   Ht 5\' 10"  (1.778 m)   Wt 240 lb 6.4 oz (109 kg)   SpO2 95%   BMI 34.49 kg/m     Wt Readings from Last 3 Encounters:  02/08/23 240 lb 6.4 oz (109 kg)  02/07/23 242 lb 9.6 oz (110 kg)  01/23/23 242 lb 12.8 oz (110.1 kg)     GEN:  Well nourished, well developed in no acute distress HEENT: Normal NECK: No JVD; No carotid bruits LYMPHATICS: No lymphadenopathy CARDIAC: RRR, 1/6SM RUSB, rubs, gallops RESPIRATORY:  Clear to auscultation without rales, wheezing or rhonchi  ABDOMEN: Soft, non-tender, non-distended MUSCULOSKELETAL:  No edema; No deformity  SKIN:  Warm and dry NEUROLOGIC:  Alert and oriented x 3 PSYCHIATRIC:  Normal affect   ASSESSMENT:  1. Thoracic aortic aneurysm without rupture, unspecified part (HCC)   2. Coronary artery disease involving native coronary artery of native heart without angina pectoris   3. Malignant neoplasm of left kidney (HCC)       PLAN:    In order of problems listed above:  Thoracic aortic aneurysm - Stable at 45 mm.  Via CT scan.  Monitoring closely.  Dr. Laneta Simmers with cardiothoracic surgery also monitoring.  Avoid heavy Valsalva type activity.  Continue to strictly control blood pressure.  Coronary artery disease with elevated coronary calcium score - Currently on Repatha.  Last LDL in the teens, 10.  Excellent.  Discussed this with him.  Anti-inflammatory etc.  No active anginal symptoms.  Renal cell carcinoma - Left nephrectomy extending into the renal vein.  Followed by oncology.  Prior note reviewed.  Scan is clear.  Hyperlipidemia - On both Zetia 10 mg as well as Repatha.  As well as 80 mg of atorvastatin.  Doing very well.  LDL 10.  No myalgias.  Diabetes with hyperlipidemia - On Jardiance as well as metformin.  Also taking Mounjaro.  Has lost about 30 pounds.  Hemoglobin A1c 6.7.  Hypothyroid --synthroid 25 mcg.  Follow-up: 42-months  Medication Adjustments/Labs and Tests Ordered: Current medicines are reviewed at length with the patient today.  Concerns regarding medicines are outlined above.   No orders of the defined types were placed in this encounter.  No orders of the defined types were placed in this encounter.  Patient Instructions  Medication Instructions:  Your physician recommends that you continue on your current medications as directed. Please refer to the Current Medication list given to you today.  *If you need a refill on your cardiac medications before your next appointment, please call your pharmacy*   Follow-Up: At [facility], you and your health  needs are our priority.  As part of our continuing mission to provide you with exceptional heart care, we have created designated Provider Care Teams.  These Care Teams include your primary Cardiologist (physician) and Advanced Practice Providers (APPs -  Physician Assistants and Nurse Practitioners) who all work together to provide you with the care you need, when you need it.  Your next appointment:   1 year(s)  Provider:   Donato Schultz, MD          Signed, Donato Schultz, MD  02/08/2023 9:40 AM    [facility]

## 2023-02-08 NOTE — Patient Instructions (Signed)
Medication Instructions:  Your physician recommends that you continue on your current medications as directed. Please refer to the Current Medication list given to you today.  *If you need a refill on your cardiac medications before your next appointment, please call your pharmacy*   Follow-Up: At [facility], you and your health needs are our priority.  As part of our continuing mission to provide you with exceptional heart care, we have created designated Provider Care Teams.  These Care Teams include your primary Cardiologist (physician) and Advanced Practice Providers (APPs -  Physician Assistants and Nurse Practitioners) who all work together to provide you with the care you need, when you need it.   Your next appointment:   1 year(s)  Provider:   Mark Skains, MD     

## 2023-02-09 LAB — T4: T4, Total: 7 ug/dL (ref 4.5–12.0)

## 2023-02-11 DIAGNOSIS — G4733 Obstructive sleep apnea (adult) (pediatric): Secondary | ICD-10-CM | POA: Diagnosis not present

## 2023-02-17 ENCOUNTER — Encounter: Payer: Self-pay | Admitting: Cardiology

## 2023-02-19 ENCOUNTER — Emergency Department (HOSPITAL_COMMUNITY): Payer: BC Managed Care – PPO

## 2023-02-19 ENCOUNTER — Other Ambulatory Visit: Payer: Self-pay

## 2023-02-19 ENCOUNTER — Encounter: Payer: Self-pay | Admitting: Internal Medicine

## 2023-02-19 ENCOUNTER — Observation Stay (HOSPITAL_COMMUNITY)
Admission: EM | Admit: 2023-02-19 | Discharge: 2023-02-20 | Disposition: A | Discharge status: Home / Self Care | Payer: BC Managed Care – PPO | Attending: Internal Medicine | Admitting: Internal Medicine

## 2023-02-19 ENCOUNTER — Encounter (HOSPITAL_COMMUNITY): Payer: Self-pay

## 2023-02-19 DIAGNOSIS — I2510 Atherosclerotic heart disease of native coronary artery without angina pectoris: Secondary | ICD-10-CM | POA: Insufficient documentation

## 2023-02-19 DIAGNOSIS — E785 Hyperlipidemia, unspecified: Secondary | ICD-10-CM | POA: Insufficient documentation

## 2023-02-19 DIAGNOSIS — R0902 Hypoxemia: Secondary | ICD-10-CM | POA: Diagnosis not present

## 2023-02-19 DIAGNOSIS — I130 Hypertensive heart and chronic kidney disease with heart failure and stage 1 through stage 4 chronic kidney disease, or unspecified chronic kidney disease: Secondary | ICD-10-CM | POA: Diagnosis not present

## 2023-02-19 DIAGNOSIS — E1169 Type 2 diabetes mellitus with other specified complication: Secondary | ICD-10-CM | POA: Diagnosis not present

## 2023-02-19 DIAGNOSIS — Z7952 Long term (current) use of systemic steroids: Secondary | ICD-10-CM | POA: Insufficient documentation

## 2023-02-19 DIAGNOSIS — C642 Malignant neoplasm of left kidney, except renal pelvis: Secondary | ICD-10-CM | POA: Insufficient documentation

## 2023-02-19 DIAGNOSIS — N1831 Chronic kidney disease, stage 3a: Secondary | ICD-10-CM | POA: Diagnosis not present

## 2023-02-19 DIAGNOSIS — E039 Hypothyroidism, unspecified: Secondary | ICD-10-CM | POA: Insufficient documentation

## 2023-02-19 DIAGNOSIS — K219 Gastro-esophageal reflux disease without esophagitis: Secondary | ICD-10-CM | POA: Diagnosis present

## 2023-02-19 DIAGNOSIS — I712 Thoracic aortic aneurysm, without rupture, unspecified: Secondary | ICD-10-CM | POA: Diagnosis present

## 2023-02-19 DIAGNOSIS — F17220 Nicotine dependence, chewing tobacco, uncomplicated: Secondary | ICD-10-CM | POA: Diagnosis not present

## 2023-02-19 DIAGNOSIS — Z79899 Other long term (current) drug therapy: Secondary | ICD-10-CM | POA: Diagnosis not present

## 2023-02-19 DIAGNOSIS — E1122 Type 2 diabetes mellitus with diabetic chronic kidney disease: Secondary | ICD-10-CM | POA: Diagnosis not present

## 2023-02-19 DIAGNOSIS — E1159 Type 2 diabetes mellitus with other circulatory complications: Secondary | ICD-10-CM | POA: Diagnosis present

## 2023-02-19 DIAGNOSIS — J9601 Acute respiratory failure with hypoxia: Secondary | ICD-10-CM | POA: Diagnosis not present

## 2023-02-19 DIAGNOSIS — I5032 Chronic diastolic (congestive) heart failure: Secondary | ICD-10-CM | POA: Diagnosis not present

## 2023-02-19 DIAGNOSIS — R0602 Shortness of breath: Secondary | ICD-10-CM | POA: Diagnosis not present

## 2023-02-19 DIAGNOSIS — I7121 Aneurysm of the ascending aorta, without rupture: Secondary | ICD-10-CM | POA: Diagnosis not present

## 2023-02-19 LAB — CBC WITH DIFFERENTIAL/PLATELET
Abs Immature Granulocytes: 0.03 10*3/uL (ref 0.00–0.07)
Basophils Absolute: 0.1 10*3/uL (ref 0.0–0.1)
Basophils Relative: 1 %
Eosinophils Absolute: 0.9 10*3/uL — ABNORMAL HIGH (ref 0.0–0.5)
Eosinophils Relative: 8 %
HCT: 47.7 % (ref 39.0–52.0)
Hemoglobin: 15.8 g/dL (ref 13.0–17.0)
Immature Granulocytes: 0 %
Lymphocytes Relative: 16 %
Lymphs Abs: 1.7 10*3/uL (ref 0.7–4.0)
MCH: 31.7 pg (ref 26.0–34.0)
MCHC: 33.1 g/dL (ref 30.0–36.0)
MCV: 95.6 fL (ref 80.0–100.0)
Monocytes Absolute: 0.7 10*3/uL (ref 0.1–1.0)
Monocytes Relative: 6 %
Neutro Abs: 7.4 10*3/uL (ref 1.7–7.7)
Neutrophils Relative %: 69 %
Platelets: 256 10*3/uL (ref 150–400)
RBC: 4.99 MIL/uL (ref 4.22–5.81)
RDW: 12.4 % (ref 11.5–15.5)
WBC: 10.8 10*3/uL — ABNORMAL HIGH (ref 4.0–10.5)
nRBC: 0 % (ref 0.0–0.2)

## 2023-02-19 LAB — COMPREHENSIVE METABOLIC PANEL
ALT: 19 U/L (ref 0–44)
AST: 19 U/L (ref 15–41)
Albumin: 4.5 g/dL (ref 3.5–5.0)
Alkaline Phosphatase: 56 U/L (ref 38–126)
Anion gap: 12 (ref 5–15)
BUN: 21 mg/dL — ABNORMAL HIGH (ref 6–20)
CO2: 24 mmol/L (ref 22–32)
Calcium: 9.5 mg/dL (ref 8.9–10.3)
Chloride: 101 mmol/L (ref 98–111)
Creatinine, Ser: 1.47 mg/dL — ABNORMAL HIGH (ref 0.61–1.24)
GFR, Estimated: 56 mL/min — ABNORMAL LOW (ref >60)
Glucose, Bld: 127 mg/dL — ABNORMAL HIGH (ref 70–99)
Potassium: 4.2 mmol/L (ref 3.5–5.1)
Sodium: 137 mmol/L (ref 135–145)
Total Bilirubin: 0.8 mg/dL (ref 0.3–1.2)
Total Protein: 7.8 g/dL (ref 6.5–8.1)

## 2023-02-19 LAB — BLOOD GAS, VENOUS
Acid-Base Excess: 4.3 mmol/L — ABNORMAL HIGH (ref 0.0–2.0)
Bicarbonate: 29.8 mmol/L — ABNORMAL HIGH (ref 20.0–28.0)
O2 Saturation: 42 %
Patient temperature: 37
pCO2, Ven: 47 mmHg (ref 44–60)
pH, Ven: 7.41 (ref 7.25–7.43)
pO2, Ven: <31 mmHg — CL (ref 32–45)

## 2023-02-19 LAB — BRAIN NATRIURETIC PEPTIDE: B Natriuretic Peptide: 17.8 pg/mL (ref 0.0–100.0)

## 2023-02-19 LAB — TROPONIN I (HIGH SENSITIVITY): Troponin I (High Sensitivity): 8 ng/L (ref <18)

## 2023-02-19 LAB — GLUCOSE, CAPILLARY: Glucose-Capillary: 176 mg/dL — ABNORMAL HIGH (ref 70–99)

## 2023-02-19 MED ORDER — ALBUTEROL SULFATE (2.5 MG/3ML) 0.083% IN NEBU
15.0000 mg/h | INHALATION_SOLUTION | Freq: Once | RESPIRATORY_TRACT | Status: AC
  Administered 2023-02-19: 15 mg/h via RESPIRATORY_TRACT
  Filled 2023-02-19: qty 3

## 2023-02-19 MED ORDER — METHYLPREDNISOLONE SODIUM SUCC 125 MG IJ SOLR
125.0000 mg | Freq: Once | INTRAMUSCULAR | Status: AC
  Administered 2023-02-19: 125 mg via INTRAVENOUS
  Filled 2023-02-19: qty 2

## 2023-02-19 MED ORDER — IOHEXOL 350 MG/ML SOLN
75.0000 mL | Freq: Once | INTRAVENOUS | Status: AC | PRN
  Administered 2023-02-19: 75 mL via INTRAVENOUS

## 2023-02-19 MED ORDER — IPRATROPIUM BROMIDE 0.02 % IN SOLN
1.0000 mg | Freq: Once | RESPIRATORY_TRACT | Status: AC
  Administered 2023-02-19: 1 mg via RESPIRATORY_TRACT
  Filled 2023-02-19: qty 5

## 2023-02-19 NOTE — ED Notes (Signed)
ED TO INPATIENT HANDOFF REPORT  ED Nurse Name and Phone #:  Edyth Gunnels RN [phone]  S Name/Age/Gender Russell Burns 57 y.o. male Room/Bed: WA18/WA18  Code Status   Code Status: Prior  Home/SNF/Other Home Patient oriented to: self, place, time, and situation Is this baseline? Yes   Triage Complete: Triage complete  Chief Complaint Acute hypoxemic respiratory failure (HCC) [J96.01]  Triage Note C/o sob with spo2 85% at home x "few days" Inhaler and neb w/ relief.  Hx thoracic aortic aneurysm and renal cell carcinoma.  Denies cp.    Allergies Allergies  Allergen Reactions   Bee Venom Anaphylaxis   Other Itching, Swelling and Other (See Comments)    Feline dander- Runny nose, itchy/puffy eyes    Level of Care/Admitting Diagnosis ED Disposition     ED Disposition  Admit   Condition  --   Comment  Hospital Area: [facility] [[id number]]  Level of Care: Telemetry [5]  Admit to tele based on following criteria: Complex arrhythmia (Bradycardia/Tachycardia)  May place patient in observation at [facility] or [facility] if equivalent level of care is available:: Yes  Covid Evaluation: Asymptomatic - no recent exposure (last 10 days) testing not required  Diagnosis: Acute hypoxemic respiratory failure [facility]) [[id number]]  Admitting Physician: John Giovanni [[id number]]  Attending Physician: John Giovanni [[id number]]          B Medical/Surgery History Past Medical History:  Diagnosis Date   Allergy    Aortic stenosis    Arthritis    Blood transfusion without reported diagnosis    Cancer (HCC)    Chronic kidney disease    Diabetes mellitus without complication (HCC)    FH: thoracic aneurysm    Hyperlipidemia    Hypertension    Obesity    Past Surgical History:  Procedure Laterality Date   CYSTOSCOPY WITH URETEROSCOPY AND STENT PLACEMENT Left 02/12/2022   Procedure: CYSTOSCOPY WITH LEFT RETROGRADE PYELOGRAM, DIAGNOSTIC URETEROSCOPY AND  STENT PLACEMENT;  Surgeon: Crista Elliot, MD;  Location: [facility];  Service: Urology;  Laterality: Left;   LAPAROSCOPIC NEPHRECTOMY, HAND ASSISTED Left 03/12/2022   Procedure: LEFT HAND ASSISTED LAPAROSCOPIC NEPHRECTOMY;  Surgeon: Crista Elliot, MD;  Location: [facility];  Service: Urology;  Laterality: Left;  NEED 2.5 HRS FOR THIS CASE   TONSILLECTOMY     WISDOM TOOTH EXTRACTION       A IV Location/Drains/Wounds Patient Lines/Drains/Airways Status     Active Line/Drains/Airways     Name Placement date Placement time Site Days   Peripheral IV 02/19/23 20 G Left Antecubital 02/19/23  2030  Antecubital  less than 1   Ureteral Drain/Stent Left ureter 6 Fr. 02/12/22  1032  Left ureter  372   Incision - 2 Ports Abdomen Right;Mid Left;Upper 03/12/22  0957  -- 344            Intake/Output Last 24 hours No intake or output data in the 24 hours ending 02/19/23 2230  Labs/Imaging Results for orders placed or performed during the hospital encounter of 02/19/23 (from the past 48 hour(s))  CBC with Differential     Status: Abnormal   Collection Time: 02/19/23  8:27 PM  Result Value Ref Range   WBC 10.8 (H) 4.0 - 10.5 K/uL   RBC 4.99 4.22 - 5.81 MIL/uL   Hemoglobin 15.8 13.0 - 17.0 g/dL   HCT 40.1 02.7 - 25.3 %   MCV 95.6 80.0 - 100.0 fL   MCH 31.7 26.0 -  34.0 pg   MCHC 33.1 30.0 - 36.0 g/dL   RDW 60.4 54.0 - 98.1 %   Platelets 256 150 - 400 K/uL   nRBC 0.0 0.0 - 0.2 %   Neutrophils Relative % 69 %   Neutro Abs 7.4 1.7 - 7.7 K/uL   Lymphocytes Relative 16 %   Lymphs Abs 1.7 0.7 - 4.0 K/uL   Monocytes Relative 6 %   Monocytes Absolute 0.7 0.1 - 1.0 K/uL   Eosinophils Relative 8 %   Eosinophils Absolute 0.9 (H) 0.0 - 0.5 K/uL   Basophils Relative 1 %   Basophils Absolute 0.1 0.0 - 0.1 K/uL   Immature Granulocytes 0 %   Abs Immature Granulocytes 0.03 0.00 - 0.07 K/uL    Comment: Performed at [facility], [address], [place], [place] [address]   Comprehensive metabolic panel     Status: Abnormal   Collection Time: 02/19/23  8:27 PM  Result Value Ref Range   Sodium 137 135 - 145 mmol/L   Potassium 4.2 3.5 - 5.1 mmol/L   Chloride 101 98 - 111 mmol/L   CO2 24 22 - 32 mmol/L   Glucose, Bld 127 (H) 70 - 99 mg/dL    Comment: Glucose reference range applies only to samples taken after fasting for at least 8 hours.   BUN 21 (H) 6 - 20 mg/dL   Creatinine, Ser 8.29 (H) 0.61 - 1.24 mg/dL   Calcium 9.5 8.9 - 56.2 mg/dL   Total Protein 7.8 6.5 - 8.1 g/dL   Albumin 4.5 3.5 - 5.0 g/dL   AST 19 15 - 41 U/L   ALT 19 0 - 44 U/L   Alkaline Phosphatase 56 38 - 126 U/L   Total Bilirubin 0.8 0.3 - 1.2 mg/dL   GFR, Estimated 56 (L) >60 mL/min    Comment: (NOTE) Calculated using the CKD-EPI Creatinine Equation (2021)    Anion gap 12 5 - 15    Comment: Performed at [facility], [address] [place], [place] [address]  Blood gas, venous (at [facility] and [facility])     Status: Abnormal   Collection Time: 02/19/23  8:27 PM  Result Value Ref Range   pH, Ven 7.41 7.25 - 7.43   pCO2, Ven 47 44 - 60 mmHg   pO2, Ven <31 (LL) 32 - 45 mmHg    Comment: CRITICAL RESULT CALLED TO, READ BACK BY AND VERIFIED WITH: C. HOLLATZ, RN 02/19/23 2045 BY K. DAVIS    Bicarbonate 29.8 (H) 20.0 - 28.0 mmol/L   Acid-Base Excess 4.3 (H) 0.0 - 2.0 mmol/L   O2 Saturation 42 %   Patient temperature 37.0     Comment: Performed at [facility], [address] [place], [place] [address]  Troponin I (High Sensitivity)     Status: None   Collection Time: 02/19/23  8:27 PM  Result Value Ref Range   Troponin I (High Sensitivity) 8 <18 ng/L    Comment: (NOTE) Elevated high sensitivity troponin I (hsTnI) values and significant  changes across serial measurements may suggest ACS but many other  chronic and acute conditions are known to elevate hsTnI results.  Refer to the "Links" section for chest pain algorithms and additional   guidance. Performed at [facility], [address] [place], [place] [address]   Brain natriuretic peptide     Status: None   Collection Time: 02/19/23  8:27 PM  Result Value Ref Range  B Natriuretic Peptide 17.8 0.0 - 100.0 pg/mL    Comment: Performed at [facility], [address] [place], [place] [address]   CT Angio Chest PE W and/or Wo Contrast  Result Date: 02/19/2023 CLINICAL DATA:  Pulmonary embolism (PE) suspected, high prob. Shortness of breath EXAM: CT ANGIOGRAPHY CHEST WITH CONTRAST TECHNIQUE: Multidetector CT imaging of the chest was performed using the standard protocol during bolus administration of intravenous contrast. Multiplanar CT image reconstructions and MIPs were obtained to evaluate the vascular anatomy. RADIATION DOSE REDUCTION: This exam was performed according to the departmental dose-optimization program which includes automated exposure control, adjustment of the mA and/or kV according to patient size and/or use of iterative reconstruction technique. CONTRAST:  75mL OMNIPAQUE IOHEXOL 350 MG/ML SOLN COMPARISON:  11/26/2022 FINDINGS: Cardiovascular: No filling defects in the pulmonary arteries to suggest pulmonary emboli. Heart is normal size. Aorta is normal caliber. Extensive coronary artery and moderate aortic calcifications. Aneurysmal dilatation of the ascending thoracic aorta, 4.5 cm, stable. Mediastinum/Nodes: Stable mildly prominent right hilar lymph nodes. Scattered small mediastinal and left hilar lymph nodes, none pathologically enlarged. Trachea and esophagus are unremarkable. Lungs/Pleura: Calcified granuloma in the left lower lobe. No confluent opacities or effusions. Upper Abdomen: No acute findings Musculoskeletal: Chest wall soft tissues are unremarkable. No acute bony abnormality. Review of the MIP images confirms the above findings. IMPRESSION: No evidence of pulmonary embolus. Coronary artery disease. No acute  cardiopulmonary disease. 4.5 cm ascending thoracic aortic aneurysm. Recommend semi-annual imaging followup by CTA or MRA and referral to cardiothoracic surgery if not already obtained. This recommendation follows 2010 ACCF/AHA/AATS/ACR/ASA/SCA/SCAI/SIR/STS/SVM Guidelines for the Diagnosis and Management of Patients With Thoracic Aortic Disease. Circulation. 2010; 121: U045-W098. Aortic aneurysm NOS (ICD10-I71.9) Aortic Atherosclerosis (ICD10-I70.0). Electronically Signed   By: Charlett Nose M.D.   On: 02/19/2023 21:39   DG Chest 2 View  Result Date: 02/19/2023 CLINICAL DATA:  Shortness of breath EXAM: CHEST - 2 VIEW COMPARISON:  11/26/2022 FINDINGS: The heart size and mediastinal contours are within normal limits. Both lungs are clear. The visualized skeletal structures are unremarkable except for degenerative changes throughout the spine. Trachea midline. IMPRESSION: No active cardiopulmonary disease. Electronically Signed   By: Judie Petit.  Shick M.D.   On: 02/19/2023 18:47    Pending Labs Unresulted Labs (From admission, onward)    None       Vitals/Pain Today's Vitals   02/19/23 1835 02/19/23 1900 02/19/23 2005 02/19/23 2132  BP:    134/81  Pulse: 95 99 98 85  Resp: 18   16  Temp:    98.1 F (36.7 C)  TempSrc:    Oral  SpO2: 93% 92% 91% 91%  Height:      PainSc:        Isolation Precautions No active isolations  Medications Medications  methylPREDNISolone sodium succinate (SOLU-MEDROL) 125 mg/2 mL injection 125 mg (125 mg Intravenous Given 02/19/23 2034)  albuterol (PROVENTIL) (2.5 MG/3ML) 0.083% nebulizer solution (15 mg/hr Nebulization Given 02/19/23 2034)  ipratropium (ATROVENT) nebulizer solution 1 mg (1 mg Nebulization Given 02/19/23 2033)  iohexol (OMNIPAQUE) 350 MG/ML injection 75 mL (75 mLs Intravenous Contrast Given 02/19/23 2120)    Mobility walks     Focused Assessments Pulmonary Assessment Handoff:  Lung sounds: Bilateral Breath Sounds: Expiratory wheezes, Inspiratory  wheezes O2 Device: Nasal Cannula O2 Flow Rate (L/min): 2 L/min    R Recommendations: See Admitting Provider Note  Report given to:   Additional Notes:

## 2023-02-19 NOTE — ED Triage Notes (Addendum)
C/o sob with spo2 85% at home x "few days" Inhaler and neb w/ relief.  Hx thoracic aortic aneurysm and renal cell carcinoma.  Denies cp.

## 2023-02-19 NOTE — ED Provider Notes (Signed)
[place] EMERGENCY DEPARTMENT AT [facility] Provider Note   CSN: [id number] Arrival date & time: 02/19/23  1815     History  Chief Complaint  Patient presents with   Shortness of Breath    Russell Burns is a 57 y.o. male history of thoracic aneurysm Renal cell carcinoma, here presenting with shortness of breath.  Patient states that he has been having shortness of breath for the last 3 to 4 days.  He has a pulse ox at home and keeps on desatting to the mid 80s.  Patient does not have oxygen at home.  Patient called pulmonology and was sent here for further evaluation.  Denies any fevers.  Denies any history of COPD.  The history is provided by the patient.       Home Medications Prior to Admission medications   Medication Sig Start Date End Date Taking? Authorizing Provider  acetaminophen (TYLENOL) 500 MG tablet Take 1,000 mg by mouth in the morning.    [provider]  albuterol (PROVENTIL) (2.5 MG/3ML) 0.083% nebulizer solution Take 3 mLs (2.5 mg total) by nebulization every 6 (six) hours as needed for wheezing or shortness of breath. 09/03/22   Cobb, Ruby Cola, NP  albuterol (VENTOLIN HFA) 108 (90 Base) MCG/ACT inhaler Inhale 2 puffs into the lungs every 6 (six) hours as needed for wheezing or shortness of breath. 12/11/21   [provider]  amLODipine (NORVASC) 10 MG tablet Take 10 mg by mouth daily. 08/01/20   [provider]  atorvastatin (LIPITOR) 80 MG tablet Take 80 mg by mouth daily.    [provider]  Budeson-Glycopyrrol-Formoterol (BREZTRI AEROSPHERE) 160-9-4.8 MCG/ACT AERO Inhale 2 puffs into the lungs 2 (two) times daily. Take 2 puffs first thing in am and then another 2 puffs about 12 hours later. 12/12/22   Nyoka Cowden, MD  cetirizine (ZYRTEC) 10 MG tablet Take 10 mg by mouth 2 (two) times daily.    [provider]  EPINEPHrine 0.3 mg/0.3 mL IJ SOAJ injection Inject 0.3 mg into the muscle as needed for  anaphylaxis.    [provider]  ezetimibe (ZETIA) 10 MG tablet Take 10 mg by mouth daily. 08/01/20   [provider]  famotidine (PEPCID) 20 MG tablet One after supper Patient taking differently: Take 20 mg by mouth daily after supper. 06/11/22   Nyoka Cowden, MD  fluticasone (FLONASE) 50 MCG/ACT nasal spray Place 2 sprays into both nostrils in the morning and at bedtime.    [provider]  ipratropium-albuterol (DUONEB) 0.5-2.5 (3) MG/3ML SOLN  11/22/22   [provider]  JARDIANCE 25 MG TABS tablet Take 25 mg by mouth daily. 07/10/20   [provider]  levothyroxine (SYNTHROID) 25 MCG tablet Take 1 tablet (25 mcg total) by mouth daily before breakfast. 12/07/22   Heilingoetter, Cassandra L, PA-C  losartan (COZAAR) 100 MG tablet Take 100 mg by mouth daily.    [provider]  metFORMIN (GLUCOPHAGE) 1000 MG tablet Take 1,000 mg by mouth 2 (two) times daily with a meal.    [provider]  ONETOUCH VERIO test strip 1 each by Other route 5 (five) times daily. 10/31/22   [provider]  pantoprazole (PROTONIX) 40 MG tablet TAKE 1 TABLET BY MOUTH DAILY. TAKE 30-60 MIN BEFORE FIRST MEAL OF THE DAY Patient taking differently: Take 40 mg by mouth daily as needed (heartburn). 09/05/22   Nyoka Cowden, MD  prochlorperazine (COMPAZINE) 10 MG tablet  Take 1 tablet (10 mg total) by mouth every 6 (six) hours as needed. 12/27/22   Si Gaul, MD  REPATHA 140 MG/ML SOSY Inject 140 mg into the skin every 14 (fourteen) days.    [provider]  tamsulosin (FLOMAX) 0.4 MG CAPS capsule Take 1 capsule (0.4 mg total) by mouth daily. 02/12/22   Crista Elliot, MD  tirzepatide [facility] 10 MG/0.5ML Pen Inject 10 mg into the skin once a week. Patient taking differently: Inject 10 mg into the skin every Friday. 11/14/21         Allergies    Bee venom and Other    Review of Systems   Review of Systems  Respiratory:  Positive for  shortness of breath.   All other systems reviewed and are negative.   Physical Exam Updated Vital Signs BP 131/88   Pulse 99   Temp 97.6 F (36.4 C) (Oral)   Resp 18   Ht 5\' 10"  (1.778 m)   SpO2 92%   BMI 34.49 kg/m  Physical Exam Vitals and nursing note reviewed.  HENT:     Head: Normocephalic.     Mouth/Throat:     Mouth: Mucous membranes are moist.  Eyes:     Extraocular Movements: Extraocular movements intact.     Pupils: Pupils are equal, round, and reactive to light.  Cardiovascular:     Rate and Rhythm: Normal rate and regular rhythm.  Pulmonary:     Comments: Diminished bilaterally.  No obvious wheezing.  No retractions. Musculoskeletal:        General: Normal range of motion.     Cervical back: Normal range of motion and neck supple.  Skin:    General: Skin is warm.     Capillary Refill: Capillary refill takes less than 2 seconds.  Neurological:     General: No focal deficit present.     Mental Status: He is alert and oriented to person, place, and time.  Psychiatric:        Mood and Affect: Mood normal.        Behavior: Behavior normal.     ED Results / Procedures / Treatments   Labs (all labs ordered are listed, but only abnormal results are displayed) Labs Reviewed  CBC WITH DIFFERENTIAL/PLATELET - Abnormal; Notable for the following components:      Result Value   WBC 10.8 (*)    Eosinophils Absolute 0.9 (*)    All other components within normal limits  BLOOD GAS, VENOUS - Abnormal; Notable for the following components:   pO2, Ven <31 (*)    Bicarbonate 29.8 (*)    Acid-Base Excess 4.3 (*)    All other components within normal limits  COMPREHENSIVE METABOLIC PANEL  BRAIN NATRIURETIC PEPTIDE  TROPONIN I (HIGH SENSITIVITY)    EKG EKG Interpretation  Date/Time:  Tuesday Feb 19 2023 18:29:10 EDT Ventricular Rate:  99 PR Interval:  166 QRS Duration: 94 QT Interval:  333 QTC Calculation: 428 R Axis:   -54 Text Interpretation: Sinus rhythm  Left anterior fascicular block Consider anterior infarct Confirmed by Alona Bene [phone]) on 02/19/2023 6:36:40 PM  Radiology DG Chest 2 View  Result Date: 02/19/2023 CLINICAL DATA:  Shortness of breath EXAM: CHEST - 2 VIEW COMPARISON:  11/26/2022 FINDINGS: The heart size and mediastinal contours are within normal limits. Both lungs are clear. The visualized skeletal structures are unremarkable except for degenerative changes throughout the spine. Trachea midline. IMPRESSION: No active cardiopulmonary disease. Electronically Signed  By: Osvaldo Shipper M.D.   On: 02/19/2023 18:47    Procedures Procedures     CRITICAL CARE Performed by: Richardean Canal   Total critical care time: 38 minutes  Critical care time was exclusive of separately billable procedures and treating other patients.  Critical care was necessary to treat or prevent imminent or life-threatening deterioration.  Critical care was time spent personally by me on the following activities: development of treatment plan with patient and/or surrogate as well as nursing, discussions with consultants, evaluation of patient's response to treatment, examination of patient, obtaining history from patient or surrogate, ordering and performing treatments and interventions, ordering and review of laboratory studies, ordering and review of radiographic studies, pulse oximetry and re-evaluation of patient's condition.  Medications Ordered in ED Medications  methylPREDNISolone sodium succinate (SOLU-MEDROL) 125 mg/2 mL injection 125 mg (125 mg Intravenous Given 02/19/23 2034)  albuterol (PROVENTIL) (2.5 MG/3ML) 0.083% nebulizer solution (15 mg/hr Nebulization Given 02/19/23 2034)  ipratropium (ATROVENT) nebulizer solution 1 mg (1 mg Nebulization Given 02/19/23 2033)    ED Course/ Medical Decision Making/ A&P                             Medical Decision Making Royzell Arminio Burns is a 57 y.o. male here presenting with shortness of breath and  hypoxia.  Consider ruptured thoracic aneurysm versus PE versus bronchitis.  Plan to get CBC and CMP and CTA chest.  Will give Solu-Medrol and nebs and reassess.  9:51 PM Patient desats to 85% and had to be on 2 L and goes up to about 90%.  I reviewed patient's labs and independently interpreted imaging studies.  Patient has stable thoracic aneurysm.  Patient has improved air movement has some mild wheezing now.  Since patient still requiring oxygen, at this point patient will be admitted for hypoxia likely from bronchitis  Problems Addressed: Hypoxia: acute illness or injury  Amount and/or Complexity of Data Reviewed Labs: ordered. Decision-making details documented in ED Course. Radiology: ordered and independent interpretation performed. Decision-making details documented in ED Course.  Risk Prescription drug management. Decision regarding hospitalization.    Final Clinical Impression(s) / ED Diagnoses Final diagnoses:  None    Rx / DC Orders ED Discharge Orders     None         Charlynne Pander, MD 02/19/23 2152

## 2023-02-19 NOTE — H&P (Signed)
History and Physical    Kayce Shangraw UJW:[id number] DOB: May 29, 1966 DOA: 02/19/2023  PCP: [name], MD  Patient coming from: Home  Chief Complaint: Shortness of breath  HPI: Russell Burns is a 57 y.o. 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  He is not on home oxygen.  In the ED, patient desatted to 85% on room air and placed on 2 L [place].  He was wheezing on exam and was given albuterol and ipratropium neb treatments and Solu-Medrol 125 mg.  Afebrile.  Labs showing no significant leukocytosis, creatinine 1.4 (at baseline), VBG with pH 7.41 and pCO2 47, troponin negative, BNP normal.  CTA chest negative for PE or any other acute cardiopulmonary disease.  Showing a 4.5 cm ascending thoracic aortic aneurysm for which radiologist recommending semiannual imaging follow-up by CTA or MRA.  Patient reports 3-day history of worsening shortness of breath.  States he checked his pulse ox at home and it was in the mid 80s at rest.  Also endorsing cough with slight sputum production and wheezing.  Denies fevers or chest pain.  Patient states he has continued to have shortness of breath and hypoxia for over a year and is being evaluated by pulmonology but never diagnosed with any lung problems.  States he has been treated with multiple rounds of steroids, most recent in February.  He denies history of cigarette smoking.  States his maternal uncle had alpha-1 antitrypsin deficiency and COPD.  He did have pulmonary function test done a few months ago.  Also had recent sleep study  done and is supposed to be started on CPAP soon.  At home he is using Breztri twice daily, DuoNeb as needed, and albuterol inhaler as needed.  Review of Systems:  Review of Systems  All other systems reviewed and are negative.   Past Medical History:  Diagnosis Date   Allergy    Aortic stenosis    Arthritis    Blood transfusion without reported diagnosis    Cancer (HCC)    Chronic kidney disease    Diabetes mellitus without complication (HCC)    FH: thoracic aneurysm    Hyperlipidemia    Hypertension    Obesity     Past Surgical History:  Procedure Laterality Date   CYSTOSCOPY WITH URETEROSCOPY AND STENT PLACEMENT Left 02/12/2022   Procedure: CYSTOSCOPY WITH LEFT RETROGRADE PYELOGRAM, DIAGNOSTIC URETEROSCOPY AND STENT PLACEMENT;  Surgeon: Crista Elliot, MD;  Location: [facility];  Service: Urology;  Laterality: Left;   LAPAROSCOPIC NEPHRECTOMY, HAND ASSISTED Left 03/12/2022   Procedure: LEFT HAND ASSISTED LAPAROSCOPIC NEPHRECTOMY;  Surgeon: Crista Elliot, MD;  Location: [facility];  Service: Urology;  Laterality: Left;  NEED 2.5 HRS FOR THIS CASE   TONSILLECTOMY     WISDOM TOOTH EXTRACTION       reports that he has never smoked. His smokeless tobacco use includes chew. He reports current alcohol use. He reports that he does not use drugs.  Allergies  Allergen Reactions   Bee Venom Anaphylaxis   Other Itching, Swelling and Other (See Comments)    Feline dander- Runny nose, itchy/puffy  eyes    Family History  Problem Relation Age of Onset   CAD Other    Diabetes Mellitus II Other    Hypertension Other    Heart failure Other    Hypertension Mother    Heart attack Father        21, 47 at both ages   Diabetes Mellitus II Father        insulin dependent   Aneurysm Maternal Uncle        aortic   Heart attack Paternal Uncle 53       minor   Hypertension Paternal Uncle    Heart attack Maternal Grandmother 72       massive   Aneurysm Maternal Grandfather         abdominal   Heart attack Paternal Grandfather 28   Aneurysm Cousin 44       aortic   Colon polyps Neg Hx    Esophageal cancer Neg Hx    Rectal cancer Neg Hx    Stomach cancer Neg Hx     Prior to Admission medications   Medication Sig Start Date End Date Taking? Authorizing Provider  acetaminophen (TYLENOL) 500 MG tablet Take 1,000 mg by mouth in the morning.   Yes [provider]  albuterol (PROVENTIL) (2.5 MG/3ML) 0.083% nebulizer solution Take 3 mLs (2.5 mg total) by nebulization every 6 (six) hours as needed for wheezing or shortness of breath. 09/03/22  Yes Cobb, Ruby Cola, NP  albuterol (VENTOLIN HFA) 108 (90 Base) MCG/ACT inhaler Inhale 2 puffs into the lungs every 6 (six) hours as needed for wheezing or shortness of breath. 12/11/21  Yes [provider]  amLODipine (NORVASC) 10 MG tablet Take 10 mg by mouth daily. 08/01/20  Yes [provider]  atorvastatin (LIPITOR) 80 MG tablet Take 80 mg by mouth daily.   Yes [provider]  Budeson-Glycopyrrol-Formoterol (BREZTRI AEROSPHERE) 160-9-4.8 MCG/ACT AERO Inhale 2 puffs into the lungs 2 (two) times daily. Take 2 puffs first thing in am and then another 2 puffs about 12 hours later. 12/12/22  Yes Nyoka Cowden, MD  cetirizine (ZYRTEC) 10 MG tablet Take 10 mg by mouth 2 (two) times daily.   Yes [provider]  EPINEPHrine 0.3 mg/0.3 mL IJ SOAJ injection Inject 0.3 mg into the muscle as needed for anaphylaxis.   Yes [provider]  ezetimibe (ZETIA) 10 MG tablet Take 10 mg by mouth daily. 08/01/20  Yes [provider]  famotidine (PEPCID) 20 MG tablet One after supper Patient taking differently: Take 20 mg by mouth daily after supper. 06/11/22  Yes Nyoka Cowden, MD  fluticasone (FLONASE) 50 MCG/ACT nasal spray Place 2 sprays into both nostrils in the morning and at bedtime.   Yes [provider]  ipratropium-albuterol (DUONEB) 0.5-2.5 (3) MG/3ML SOLN Take 3 mLs by  nebulization every 6 (six) hours as needed (sob/wheezing). 11/22/22  Yes [provider]  JARDIANCE 25 MG TABS tablet Take 25 mg by mouth daily. 07/10/20  Yes [provider]  levothyroxine (SYNTHROID) 25 MCG tablet Take 1 tablet (25 mcg total) by mouth daily before breakfast. 12/07/22  Yes Heilingoetter, Cassandra L, PA-C  losartan (COZAAR) 100 MG tablet Take 100 mg by mouth daily.   Yes [provider]  metFORMIN (GLUCOPHAGE) 1000 MG tablet Take 1,000 mg by mouth 2 (two) times daily with a meal.   Yes [provider]  pantoprazole (PROTONIX) 40 MG tablet TAKE 1 TABLET BY MOUTH DAILY. TAKE  30-60 MIN BEFORE FIRST MEAL OF THE DAY Patient taking differently: Take 40 mg by mouth daily. 09/05/22  Yes Nyoka Cowden, MD  prochlorperazine (COMPAZINE) 10 MG tablet Take 1 tablet (10 mg total) by mouth every 6 (six) hours as needed. Patient taking differently: Take 10 mg by mouth every 6 (six) hours as needed for nausea or vomiting. 12/27/22  Yes Si Gaul, MD  tamsulosin (FLOMAX) 0.4 MG CAPS capsule Take 1 capsule (0.4 mg total) by mouth daily. 02/12/22  Yes Crista Elliot, MD  tirzepatide [facility]) 10 MG/0.5ML Pen Inject 10 mg into the skin once a week. Patient taking differently: Inject 10 mg into the skin every Friday. 11/14/21  Yes   ONETOUCH VERIO test strip 1 each by Other route 5 (five) times daily. 10/31/22   [provider]  REPATHA 140 MG/ML SOSY Inject 140 mg into the skin every 14 (fourteen) days.    [provider]    Physical Exam: Vitals:   02/19/23 2005 02/19/23 2132 02/19/23 2238 02/19/23 2254  BP:  134/81 (!) 135/91 (!) 141/94  Pulse: 98 85 91 91  Resp:  16 20 16   Temp:  98.1 F (36.7 C) 98.4 F (36.9 C) 97.9 F (36.6 C)  TempSrc:  Oral Oral Oral  SpO2: 91% 91% 93% 96%  Height:        Physical Exam Vitals reviewed.  Constitutional:      General: He is not in acute distress. HENT:     Head: Normocephalic and  atraumatic.  Eyes:     Extraocular Movements: Extraocular movements intact.  Cardiovascular:     Rate and Rhythm: Normal rate and regular rhythm.     Pulses: Normal pulses.     Heart sounds: Murmur heard.     Comments: Murmur best appreciated in the aortic region Pulmonary:     Effort: Pulmonary effort is normal. No respiratory distress.     Breath sounds: Wheezing present. No rales.     Comments: Mild wheezing Abdominal:     General: Bowel sounds are normal.     Palpations: Abdomen is soft.     Tenderness: There is no abdominal tenderness. There is no guarding.  Musculoskeletal:     Cervical back: Normal range of motion.     Right lower leg: No edema.     Left lower leg: No edema.  Skin:    General: Skin is warm and dry.  Neurological:     General: No focal deficit present.     Mental Status: He is alert and oriented to person, place, and time.     Labs on Admission: I have personally reviewed following labs and imaging studies  CBC: Recent Labs  Lab 02/19/23 2027  WBC 10.8*  NEUTROABS 7.4  HGB 15.8  HCT 47.7  MCV 95.6  PLT 256   Basic Metabolic Panel: Recent Labs  Lab 02/19/23 2027  NA 137  K 4.2  CL 101  CO2 24  GLUCOSE 127*  BUN 21*  CREATININE 1.47*  CALCIUM 9.5   GFR: Estimated Creatinine Clearance: 69.4 mL/min (A) (by C-G formula based on SCr of 1.47 mg/dL (H)). Liver Function Tests: Recent Labs  Lab 02/19/23 2027  AST 19  ALT 19  ALKPHOS 56  BILITOT 0.8  PROT 7.8  ALBUMIN 4.5   No results for input(s): "LIPASE", "AMYLASE" in the last 168 hours. No results for input(s): "AMMONIA" in the last 168 hours. Coagulation Profile: No results for input(s): "INR", "PROTIME" in  the last 168 hours. Cardiac Enzymes: No results for input(s): "CKTOTAL", "CKMB", "CKMBINDEX", "TROPONINI" in the last 168 hours. BNP (last 3 results) Recent Labs    06/11/22 1600  PROBNP 43.0   HbA1C: No results for input(s): "HGBA1C" in the last 72  hours. CBG: Recent Labs  Lab 02/19/23 2259  GLUCAP 176*   Lipid Profile: No results for input(s): "CHOL", "HDL", "LDLCALC", "TRIG", "CHOLHDL", "LDLDIRECT" in the last 72 hours. Thyroid Function Tests: No results for input(s): "TSH", "T4TOTAL", "FREET4", "T3FREE", "THYROIDAB" in the last 72 hours. Anemia Panel: No results for input(s): "VITAMINB12", "FOLATE", "FERRITIN", "TIBC", "IRON", "RETICCTPCT" in the last 72 hours. Urine analysis:    Component Value Date/Time   COLORURINE STRAW (A) 06/15/2022 0456   APPEARANCEUR CLEAR 06/15/2022 0456   LABSPEC 1.030 06/15/2022 0456   PHURINE 5.0 06/15/2022 0456   GLUCOSEU >=500 (A) 06/15/2022 0456   HGBUR NEGATIVE 06/15/2022 0456   BILIRUBINUR NEGATIVE 06/15/2022 0456   KETONESUR NEGATIVE 06/15/2022 0456   PROTEINUR NEGATIVE 06/15/2022 0456   NITRITE NEGATIVE 06/15/2022 0456   LEUKOCYTESUR NEGATIVE 06/15/2022 0456    Radiological Exams on Admission: CT Angio Chest PE W and/or Wo Contrast  Result Date: 02/19/2023 CLINICAL DATA:  Pulmonary embolism (PE) suspected, high prob. Shortness of breath EXAM: CT ANGIOGRAPHY CHEST WITH CONTRAST TECHNIQUE: Multidetector CT imaging of the chest was performed using the standard protocol during bolus administration of intravenous contrast. Multiplanar CT image reconstructions and MIPs were obtained to evaluate the vascular anatomy. RADIATION DOSE REDUCTION: This exam was performed according to the departmental dose-optimization program which includes automated exposure control, adjustment of the mA and/or kV according to patient size and/or use of iterative reconstruction technique. CONTRAST:  75mL OMNIPAQUE IOHEXOL 350 MG/ML SOLN COMPARISON:  11/26/2022 FINDINGS: Cardiovascular: No filling defects in the pulmonary arteries to suggest pulmonary emboli. Heart is normal size. Aorta is normal caliber. Extensive coronary artery and moderate aortic calcifications. Aneurysmal dilatation of the ascending thoracic  aorta, 4.5 cm, stable. Mediastinum/Nodes: Stable mildly prominent right hilar lymph nodes. Scattered small mediastinal and left hilar lymph nodes, none pathologically enlarged. Trachea and esophagus are unremarkable. Lungs/Pleura: Calcified granuloma in the left lower lobe. No confluent opacities or effusions. Upper Abdomen: No acute findings Musculoskeletal: Chest wall soft tissues are unremarkable. No acute bony abnormality. Review of the MIP images confirms the above findings. IMPRESSION: No evidence of pulmonary embolus. Coronary artery disease. No acute cardiopulmonary disease. 4.5 cm ascending thoracic aortic aneurysm. Recommend semi-annual imaging followup by CTA or MRA and referral to cardiothoracic surgery if not already obtained. This recommendation follows 2010 ACCF/AHA/AATS/ACR/ASA/SCA/SCAI/SIR/STS/SVM Guidelines for the Diagnosis and Management of Patients With Thoracic Aortic Disease. Circulation. 2010; 121: U045-W098. Aortic aneurysm NOS (ICD10-I71.9) Aortic Atherosclerosis (ICD10-I70.0). Electronically Signed   By: Charlett Nose M.D.   On: 02/19/2023 21:39   DG Chest 2 View  Result Date: 02/19/2023 CLINICAL DATA:  Shortness of breath EXAM: CHEST - 2 VIEW COMPARISON:  11/26/2022 FINDINGS: The heart size and mediastinal contours are within normal limits. Both lungs are clear. The visualized skeletal structures are unremarkable except for degenerative changes throughout the spine. Trachea midline. IMPRESSION: No active cardiopulmonary disease. Electronically Signed   By: Judie Petit.  Shick M.D.   On: 02/19/2023 18:47    EKG: Independently reviewed.  Sinus rhythm, LAFB.  No significant change since prior tracing.  Assessment and Plan  Acute hypoxemic respiratory failure secondary to possible reactive airway disease Patient presenting with 3-day history of shortness of breath, cough, and wheezing.  Oxygen saturation in  the mid 80s on room air at rest, currently stable on 2 L [place].  CTA chest negative for  PE or any other acute cardiopulmonary disease. Blood gas without evidence of hypercapnia.  Troponin negative and BNP normal. He received albuterol and ipratropium neb treatments and Solu-Medrol 125 mg in the ED.  Continues to have mild wheezing. PFTs done in March 2024 showing very minimal obstructive airway disease, restriction likely related to body habitus, and increased diffusion.  Patient denies history of cigarette smoking.  He does report family history of alpha 1 antitrypsin deficiency and COPD.  He was recently seen by pulmonology on 5/1 and has no formal diagnosis of COPD.  Continue IV steroids, scheduled DuoNeb, albuterol neb as needed, and Mucinex as needed.  Continue supplemental oxygen. He does have history of moderate aortic stenosis and last echo was done in January 2023.  Given ongoing dyspnea for over a year, repeat echocardiogram ordered.  Consult pulmonology in the morning.  Chronic HFpEF Echo done in January 2023 showing EF 55 to 60%, grade 1 diastolic dysfunction, trivial MVR, trivial AVR, and moderate AVS.  No signs of volume overload at this time.  CAD Not endorsing chest pain.  EKG without acute ischemic changes and troponin negative.  Stage Burns clear-cell renal cell carcinoma Status post left radical nephrectomy in June 2023 and currently on immunotherapy.  Outpatient oncology follow-up.  CKD stage IIIa Renal function stable.  Non-insulin-dependent type 2 diabetes A1c 7.0 in April 2023.  Sensitive sliding scale insulin ACHS.  Hypertension Stable.  Continue amlodipine and losartan.  Hyperlipidemia Continue Zetia and Lipitor.  GERD Continue Pepcid and Protonix.  Hypothyroidism Continue Synthroid.  BPH  Continue Flomax.  Ascending thoracic aortic aneurysm CT showing a 4.5 cm ascending thoracic aortic aneurysm for which radiologist recommending semiannual imaging follow-up by CTA or MRA.  Discussed with the patient and he is already followed by cardiothoracic  surgery, will need outpatient follow-up.  DVT prophylaxis: Lovenox Code Status: Full Code (discussed with the patient) Level of care: Telemetry bed Admission status: It is my clinical opinion that referral for OBSERVATION is reasonable and necessary in this patient based on the above information provided. The aforementioned taken together are felt to place the patient at high risk for further clinical deterioration. However, it is anticipated that the patient may be medically stable for discharge from the hospital within 24 to 48 hours.   John Giovanni MD [facility]  If 7PM-7AM, please contact night-coverage [email or web address]  02/19/2023, 11:15 PM

## 2023-02-20 ENCOUNTER — Observation Stay (HOSPITAL_BASED_OUTPATIENT_CLINIC_OR_DEPARTMENT_OTHER): Payer: BC Managed Care – PPO

## 2023-02-20 ENCOUNTER — Telehealth: Payer: Self-pay | Admitting: Internal Medicine

## 2023-02-20 ENCOUNTER — Observation Stay (HOSPITAL_COMMUNITY): Payer: BC Managed Care – PPO

## 2023-02-20 DIAGNOSIS — R1310 Dysphagia, unspecified: Secondary | ICD-10-CM | POA: Diagnosis not present

## 2023-02-20 DIAGNOSIS — I350 Nonrheumatic aortic (valve) stenosis: Secondary | ICD-10-CM

## 2023-02-20 DIAGNOSIS — K224 Dyskinesia of esophagus: Secondary | ICD-10-CM | POA: Diagnosis not present

## 2023-02-20 DIAGNOSIS — J9601 Acute respiratory failure with hypoxia: Secondary | ICD-10-CM | POA: Diagnosis not present

## 2023-02-20 LAB — ECHOCARDIOGRAM COMPLETE
AR max vel: 1.21 cm2
AV Area VTI: 1.31 cm2
AV Area mean vel: 1 cm2
AV Mean grad: 37 mmHg
AV Peak grad: 53.3 mmHg
Ao pk vel: 3.65 m/s
Area-P 1/2: 4.21 cm2
Height: 70 in
Weight: 3844.82 oz

## 2023-02-20 LAB — SEDIMENTATION RATE: Sed Rate: 7 mm/hr (ref 0–16)

## 2023-02-20 LAB — C-REACTIVE PROTEIN: CRP: 0.5 mg/dL (ref <1.0)

## 2023-02-20 LAB — FERRITIN: Ferritin: 44 ng/mL (ref 24–336)

## 2023-02-20 LAB — CK: Total CK: 256 U/L (ref 49–397)

## 2023-02-20 LAB — HIV ANTIBODY (ROUTINE TESTING W REFLEX): HIV Screen 4th Generation wRfx: NONREACTIVE

## 2023-02-20 LAB — GLUCOSE, CAPILLARY
Glucose-Capillary: 214 mg/dL — ABNORMAL HIGH (ref 70–99)
Glucose-Capillary: 185 mg/dL — ABNORMAL HIGH (ref 70–99)

## 2023-02-20 LAB — TROPONIN I (HIGH SENSITIVITY): Troponin I (High Sensitivity): 7 ng/L (ref <18)

## 2023-02-20 MED ORDER — ACETAMINOPHEN 325 MG PO TABS: 650.0000 mg | ORAL_TABLET | Freq: Four times a day (QID) | ORAL | Status: DC | PRN

## 2023-02-20 MED ORDER — INSULIN ASPART 100 UNIT/ML IJ SOLN: 0.0000 [IU] | Freq: Every day | INTRAMUSCULAR | Status: DC

## 2023-02-20 MED ORDER — MONTELUKAST SODIUM 10 MG PO TABS: 10.0000 mg | ORAL_TABLET | Freq: Every day | ORAL | Status: DC

## 2023-02-20 MED ORDER — INSULIN ASPART 100 UNIT/ML IJ SOLN
0.0000 [IU] | Freq: Three times a day (TID) | INTRAMUSCULAR | Status: DC
  Administered 2023-02-20: 2 [IU] via SUBCUTANEOUS
  Administered 2023-02-20: 3 [IU] via SUBCUTANEOUS

## 2023-02-20 MED ORDER — PREDNISONE 20 MG PO TABS: 20.0000 mg | ORAL_TABLET | Freq: Every day | ORAL | Status: DC

## 2023-02-20 MED ORDER — MONTELUKAST SODIUM 10 MG PO TABS: 10.0000 mg | ORAL_TABLET | Freq: Every day | ORAL | 0 refills | Status: DC

## 2023-02-20 MED ORDER — GUAIFENESIN ER 600 MG PO TB12
600.0000 mg | ORAL_TABLET | Freq: Two times a day (BID) | ORAL | Status: DC | PRN
  Administered 2023-02-20: 600 mg via ORAL
  Filled 2023-02-20: qty 1

## 2023-02-20 MED ORDER — ORAL CARE MOUTH RINSE: 15.0000 mL | OROMUCOSAL | Status: DC | PRN

## 2023-02-20 MED ORDER — LEVOTHYROXINE SODIUM 25 MCG PO TABS
25.0000 ug | ORAL_TABLET | Freq: Every day | ORAL | Status: DC
  Administered 2023-02-20: 25 ug via ORAL
  Filled 2023-02-20: qty 1

## 2023-02-20 MED ORDER — PANTOPRAZOLE SODIUM 40 MG PO TBEC: 40.0000 mg | DELAYED_RELEASE_TABLET | Freq: Two times a day (BID) | ORAL | Status: DC

## 2023-02-20 MED ORDER — EZETIMIBE 10 MG PO TABS
10.0000 mg | ORAL_TABLET | Freq: Every day | ORAL | Status: DC
  Administered 2023-02-20: 10 mg via ORAL
  Filled 2023-02-20: qty 1

## 2023-02-20 MED ORDER — PANTOPRAZOLE SODIUM 40 MG PO TBEC: 40.0000 mg | DELAYED_RELEASE_TABLET | Freq: Two times a day (BID) | ORAL | 0 refills | Status: DC

## 2023-02-20 MED ORDER — ATORVASTATIN CALCIUM 40 MG PO TABS
80.0000 mg | ORAL_TABLET | Freq: Every day | ORAL | Status: DC
  Administered 2023-02-20: 80 mg via ORAL
  Filled 2023-02-20: qty 2

## 2023-02-20 MED ORDER — FAMOTIDINE 20 MG PO TABS: 20.0000 mg | ORAL_TABLET | Freq: Every day | ORAL | Status: DC

## 2023-02-20 MED ORDER — TAMSULOSIN HCL 0.4 MG PO CAPS
0.4000 mg | ORAL_CAPSULE | Freq: Every day | ORAL | Status: DC
  Administered 2023-02-20: 0.4 mg via ORAL
  Filled 2023-02-20: qty 1

## 2023-02-20 MED ORDER — PANTOPRAZOLE SODIUM 40 MG PO TBEC
40.0000 mg | DELAYED_RELEASE_TABLET | Freq: Every day | ORAL | Status: DC
  Administered 2023-02-20: 40 mg via ORAL
  Filled 2023-02-20: qty 1

## 2023-02-20 MED ORDER — METHYLPREDNISOLONE SODIUM SUCC 40 MG IJ SOLR
40.0000 mg | Freq: Two times a day (BID) | INTRAMUSCULAR | Status: DC
  Administered 2023-02-20: 40 mg via INTRAVENOUS
  Filled 2023-02-20: qty 1

## 2023-02-20 MED ORDER — ACETAMINOPHEN 650 MG RE SUPP: 650.0000 mg | Freq: Four times a day (QID) | RECTAL | Status: DC | PRN

## 2023-02-20 MED ORDER — AMLODIPINE BESYLATE 10 MG PO TABS
10.0000 mg | ORAL_TABLET | Freq: Every day | ORAL | Status: DC
  Administered 2023-02-20: 10 mg via ORAL
  Filled 2023-02-20: qty 1

## 2023-02-20 MED ORDER — ALBUTEROL SULFATE (2.5 MG/3ML) 0.083% IN NEBU: 2.5000 mg | INHALATION_SOLUTION | RESPIRATORY_TRACT | Status: DC | PRN

## 2023-02-20 MED ORDER — PREDNISONE 20 MG PO TABS: 20.0000 mg | ORAL_TABLET | Freq: Two times a day (BID) | ORAL | 0 refills | Status: DC

## 2023-02-20 MED ORDER — IPRATROPIUM-ALBUTEROL 0.5-2.5 (3) MG/3ML IN SOLN
3.0000 mL | Freq: Four times a day (QID) | RESPIRATORY_TRACT | Status: DC
  Administered 2023-02-20 (×3): 3 mL via RESPIRATORY_TRACT
  Filled 2023-02-20 (×3): qty 3

## 2023-02-20 MED ORDER — ENOXAPARIN SODIUM 40 MG/0.4ML IJ SOSY
40.0000 mg | PREFILLED_SYRINGE | INTRAMUSCULAR | Status: DC
  Administered 2023-02-20: 40 mg via SUBCUTANEOUS
  Filled 2023-02-20: qty 0.4

## 2023-02-20 MED ORDER — PERFLUTREN LIPID MICROSPHERE
1.0000 mL | INTRAVENOUS | Status: DC | PRN
  Administered 2023-02-20: 2 mL via INTRAVENOUS

## 2023-02-20 MED ORDER — LOSARTAN POTASSIUM 50 MG PO TABS
100.0000 mg | ORAL_TABLET | Freq: Every day | ORAL | Status: DC
  Administered 2023-02-20: 100 mg via ORAL
  Filled 2023-02-20: qty 2

## 2023-02-20 NOTE — Consult Note (Signed)
NAMEMarkuz Burns, MRN:  [id number], DOB:  1966-08-05, LOS: 0 ADMISSION DATE:  02/19/2023, CONSULTATION DATE:  02/20/23 REFERRING MD:  Natale Milch, CHIEF COMPLAINT:  SOB   History of Present Illness:  57 year old man with a history of "asthma" followed by Dr. Elesa Massed in the office presenting with recurrent hypoxemia.  He has been having this issue for the past 13 months.  No clear inciting triggers.  Somewhat helped with inhalers.  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  He is on maintenance Keytruda, however his recurrent "asthma exacerbations" preceded starting this medication.  Notably, the patient was started on Mounjaro about 2 years ago.  He has noticed increased reflux with this medication.  He is on PPI in the morning, H2 blocker at night, has tried to cut down on alcohol and caffeine consumption, sleeping in a recliner.  Most the time when he has his exacerbations it involves wheezing, dyspnea at rest and with exertion.  When there is a cough it is dry and associated with feelings of glottic secretions that he is unable to expectorate.  This has helped somewhat by flutter valve use at home.  Current exacerbation started Sunday with feelings of fatigue in the morning followed by progressive shortness of breath.  Pulse oximetry reading at home gradually declined to the low 80s so he came into the emergency room.  There he was found to be hypoxemic and started on 2 L oxygen.  CTA was performed which was negative for pulmonary embolus but again showed small airway disease and air trapping.  Inpatient pulmonology consulted to assist with management.  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 rate 20, height 5\' 10"  (1.778 m), SpO2 95 %.    FiO2 (%):  [28 %] 28 %   Intake/Output Summary (Last 24 hours) at 02/20/2023 0852 Last data filed at 02/20/2023 0700 Gross per 24 hour  Intake 440 ml  Output --  Net 440 ml   There were no vitals filed for this visit.  Examination: General: No acute distress sitting up in bed HENT: Mucous membranes are moist, mild cobblestoning the posterior pharynx Lungs: Lungs are clear to auscultation bilaterally, he has no wheezing in all lung fields, no accessory muscle use Cardiovascular: Heart sounds are regular, he has a systolic murmur, extremities are warm Abdomen: Abdomen is soft, positive bowel sounds Extremities: No edema or stigmata of arthritis Neuro: Moves all 4 extremities to command Psych: Alert and oriented x 3, excellent insight  Resolved Hospital Problem list   Not applicable  Assessment & Plan:  Acute hypoxemic respiratory failure in the context of recurrent bronchitis.  Eosinophils on admission were 900.  Imaging for these recurrent events consistent with bronchospasm.  Home regimen consists of registry, Zyrtec, Flonase, DuoNebs as needed, aggressive antireflux medication regimen.  There are some vague feelings of dysphagia and I do note that his thoracic aneurysm directly abuts the  esophagus in the upper third and may be causing some mechanical issues.  This may be leading to his recurrent exacerbations.  We can also do the autoimmune workup but suspect low yield.  He has been started on steroids, I do think that getting an IgE would be helpful  and he may benefit from a biologic as an outpatient.  During my exam I put him on room air and he was saturating 96 to 97% on room air.  His bronchospasm seems to have resolved. - Esophagram, if this reveals dysmotility he is nearing maximal lifestyle modifications for his GERD.  May need to discuss moving up his aortic root and ascending arch surgery should he be a candidate. - Starting on nightly CPAP may help stent open his trachea and reduce nocturnal reflux hopefully - Will send autoimmune labs - Walking pulse oximetry prior to discharge to see if he qualifies for oxygen at least as needed - As an outpatient, I would check an IgE, nocturnal pulse oximetry on CPAP and consider initiating a biologic. - Continue triple therapy for bronchospasm and steroids - Will add Singulair - Will set up OP appt with another MD in our office per patient request  Best Practice (right click and "Reselect all SmartList Selections" daily)  Per primary  Labs   CBC: Recent Labs  Lab 02/19/23 2027  WBC 10.8*  NEUTROABS 7.4  HGB 15.8  HCT 47.7  MCV 95.6  PLT 256    Basic Metabolic Panel: Recent Labs  Lab 02/19/23 2027  NA 137  K 4.2  CL 101  CO2 24  GLUCOSE 127*  BUN 21*  CREATININE 1.47*  CALCIUM 9.5   GFR: Estimated Creatinine Clearance: 69.4 mL/min (A) (by C-G formula based on SCr of 1.47 mg/dL (H)). Recent Labs  Lab 02/19/23 2027  WBC 10.8*    Liver Function Tests: Recent Labs  Lab 02/19/23 2027  AST 19  ALT 19  ALKPHOS 56  BILITOT 0.8  PROT 7.8  ALBUMIN 4.5   No results for input(s): "LIPASE", "AMYLASE" in the last 168 hours. No results for input(s): "AMMONIA" in the last 168 hours.  ABG    Component Value Date/Time   HCO3 29.8 (H) 02/19/2023 2027   TCO2 26 11/26/2022 1633   ACIDBASEDEF 1.0 11/26/2022 1633   O2SAT 42 02/19/2023 2027     Coagulation Profile: No results for input(s): "INR", "PROTIME" in the last 168 hours.  Cardiac Enzymes: No results for  input(s): "CKTOTAL", "CKMB", "CKMBINDEX", "TROPONINI" in the last 168 hours.  HbA1C: Hgb A1c MFr Bld  Date/Time Value Ref Range Status  01/02/2022 02:28 AM 7.0 (H) 4.8 - 5.6 % Final    Comment:    (NOTE) Pre diabetes:          5.7%-6.4%  Diabetes:              >6.4%  Glycemic control for   <7.0% adults with diabetes     CBG: Recent Labs  Lab 02/19/23 2259 02/20/23 0749  GLUCAP 176* 214*    Review of Systems:    Positive Symptoms in bold:  Constitutional fevers, chills, weight loss, fatigue, anorexia, malaise  Eyes decreased vision, double vision, eye irritation  Ears, Nose, Mouth, Throat sore throat, trouble swallowing, sinus congestion  Cardiovascular chest pain, paroxysmal nocturnal dyspnea, lower ext edema, palpitations   Respiratory SOB, cough, DOE, hemoptysis, wheezing  Gastrointestinal nausea, vomiting, diarrhea  Genitourinary burning with urination, trouble urinating  Musculoskeletal joint aches, joint swelling, back pain  Integumentary  rashes, skin lesions  Neurological focal weakness, focal numbness, trouble speaking, headaches  Psychiatric depression, anxiety, confusion  Endocrine polyuria, polydipsia, cold intolerance, heat intolerance  Hematologic abnormal bruising, abnormal bleeding, unexplained nose bleeds  Allergic/Immunologic recurrent infections, hives, swollen lymph nodes     Past Medical History:  He,  has a past medical history of Allergy, Aortic stenosis, Arthritis, Blood transfusion without reported diagnosis, Cancer (HCC), Chronic kidney disease, Diabetes mellitus without complication (HCC), FH: thoracic aneurysm, Hyperlipidemia, Hypertension, and Obesity.   Surgical History:   Past Surgical History:  Procedure Laterality Date   CYSTOSCOPY WITH URETEROSCOPY AND STENT PLACEMENT Left 02/12/2022   Procedure: CYSTOSCOPY WITH LEFT RETROGRADE PYELOGRAM, DIAGNOSTIC URETEROSCOPY AND STENT PLACEMENT;  Surgeon: Crista Elliot, MD;  Location: [facility]  [facility];  Service: Urology;  Laterality: Left;   LAPAROSCOPIC NEPHRECTOMY, HAND ASSISTED Left 03/12/2022   Procedure: LEFT HAND ASSISTED LAPAROSCOPIC NEPHRECTOMY;  Surgeon: Crista Elliot, MD;  Location: [facility];  Service: Urology;  Laterality: Left;  NEED 2.5 HRS FOR THIS CASE   TONSILLECTOMY     WISDOM TOOTH EXTRACTION       Social History:   reports that he has never smoked. His smokeless tobacco use includes chew. He reports current alcohol use. He reports that he does not use drugs.   Family History:  His family history includes Aneurysm in his maternal grandfather and maternal uncle; Aneurysm (age of onset: 67) in his cousin; CAD in an other family member; Diabetes Mellitus II in his father and another family member; Heart attack in his father; Heart attack (age of onset: 15) in his paternal grandfather; Heart attack (age of onset: 62) in his paternal uncle; Heart attack (age of onset: 32) in his maternal grandmother; Heart failure in an other family member; Hypertension in his mother, paternal uncle, and another family member. There is no history of Colon polyps, Esophageal cancer, Rectal cancer, or Stomach cancer.   Allergies Allergies  Allergen Reactions   Bee Venom Anaphylaxis   Other Itching, Swelling and Other (See Comments)    Feline dander- Runny nose, itchy/puffy eyes     Home Medications  Prior to Admission medications   Medication Sig Start Date End Date Taking? Authorizing Provider  acetaminophen (TYLENOL) 500 MG tablet Take 1,000 mg by mouth in the morning.   Yes [provider]  albuterol (PROVENTIL) (2.5 MG/3ML) 0.083% nebulizer solution Take 3 mLs (2.5 mg total) by nebulization every 6 (six) hours as needed for wheezing or shortness of breath. 09/03/22  Yes Cobb, Ruby Cola, NP  albuterol (VENTOLIN HFA) 108 (90 Base) MCG/ACT inhaler Inhale 2 puffs into the lungs every 6 (six) hours as needed for wheezing or shortness of breath. 12/11/21  Yes [provider]  amLODipine (NORVASC) 10 MG tablet Take 10 mg by mouth daily. 08/01/20  Yes [provider]  atorvastatin (LIPITOR) 80 MG tablet Take 80 mg by mouth daily.   Yes [provider]  Budeson-Glycopyrrol-Formoterol (BREZTRI AEROSPHERE) 160-9-4.8 MCG/ACT AERO Inhale 2 puffs into the lungs 2 (two) times daily. Take 2 puffs first thing in am and then another 2 puffs about 12 hours later. 12/12/22  Yes Nyoka Cowden, MD  cetirizine (ZYRTEC) 10 MG tablet Take 10 mg by mouth 2 (two) times daily.   Yes [provider]  EPINEPHrine 0.3 mg/0.3 mL IJ SOAJ injection Inject 0.3 mg into the muscle as needed for anaphylaxis.   Yes [provider]  ezetimibe (ZETIA) 10 MG tablet  Take 10 mg by mouth daily. 08/01/20  Yes [provider]  famotidine (PEPCID) 20 MG tablet One after supper Patient taking differently: Take 20 mg by mouth daily after supper. 06/11/22  Yes Nyoka Cowden, MD  fluticasone (FLONASE) 50 MCG/ACT nasal spray Place 2 sprays into both nostrils in the morning and at bedtime.   Yes [provider]  ipratropium-albuterol (DUONEB) 0.5-2.5 (3) MG/3ML SOLN Take 3 mLs by nebulization every 6 (six) hours as needed (sob/wheezing). 11/22/22  Yes [provider]  JARDIANCE 25 MG TABS tablet Take 25 mg by mouth daily. 07/10/20  Yes [provider]  levothyroxine (SYNTHROID) 25 MCG tablet Take 1 tablet (25 mcg total) by mouth daily before breakfast. 12/07/22  Yes Heilingoetter, Cassandra L, PA-C  losartan (COZAAR) 100 MG tablet Take 100 mg by mouth daily.   Yes [provider]  metFORMIN (GLUCOPHAGE) 1000 MG tablet Take 1,000 mg by mouth 2 (two) times daily with a meal.   Yes [provider]  pantoprazole (PROTONIX) 40 MG tablet TAKE 1 TABLET BY MOUTH DAILY. TAKE 30-60 MIN BEFORE FIRST MEAL OF THE DAY Patient taking differently: Take 40 mg by mouth daily. 09/05/22  Yes Nyoka Cowden, MD  prochlorperazine  (COMPAZINE) 10 MG tablet Take 1 tablet (10 mg total) by mouth every 6 (six) hours as needed. Patient taking differently: Take 10 mg by mouth every 6 (six) hours as needed for nausea or vomiting. 12/27/22  Yes Si Gaul, MD  tamsulosin (FLOMAX) 0.4 MG CAPS capsule Take 1 capsule (0.4 mg total) by mouth daily. 02/12/22  Yes Crista Elliot, MD  tirzepatide [facility]) 10 MG/0.5ML Pen Inject 10 mg into the skin once a week. Patient taking differently: Inject 10 mg into the skin every Friday. 11/14/21  Yes   ONETOUCH VERIO test strip 1 each by Other route 5 (five) times daily. 10/31/22   [provider]  REPATHA 140 MG/ML SOSY Inject 140 mg into the skin every 14 (fourteen) days.    [provider]     Critical care time: N/A

## 2023-02-20 NOTE — Progress Notes (Signed)
  Echocardiogram 2D Echocardiogram has been performed.  Russell Burns 02/20/2023, 1:53 PM

## 2023-02-20 NOTE — Discharge Summary (Signed)
Physician Discharge Summary  Russell Burns AOZ:[id number] DOB: 31-Dec-1965 DOA: 02/19/2023  PCP: [name], MD  Admit date: 02/19/2023 Discharge date: 02/20/2023  Admitted From: Home Disposition: Home  Recommendations for Outpatient Follow-up:  Follow up with PCP in 1-2 weeks Follow-up with pulmonology as scheduled:  Home Health: None Equipment/Devices: None  Discharge Condition: Stable CODE STATUS: Full Diet recommendation: Low-carb diet as tolerated  Brief/Interim Summary: Russell Burns is a 57 y.o. 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  He is not on home oxygen.  In the ED, patient desatted to 85% on room air and placed on 2 L [place].  He was wheezing on exam and was given albuterol and ipratropium neb treatments and Solu-Medrol 125 mg.  Afebrile.  Labs showing no significant leukocytosis, creatinine 1.4 (at baseline), VBG with pH 7.41 and pCO2 47, troponin negative, BNP normal.  CTA chest negative for PE or any other acute cardiopulmonary disease.  Showing a 4.5 cm ascending thoracic aortic aneurysm for which radiologist recommending semiannual imaging follow-up by CTA or MRA.   Patient admitted as above with recurrent episode of acute shortness of breath and hypoxia reports 3 days of oxygen saturations in the 80 percentile range at home on pulse ox.  Patient admitted with recurrent episode of respiratory distress/failure and hypoxia.  Given patient's previous hospitalizations and outpatient follow-up with oncology, cardiology and pulmonology lengthy  discussion was had here with patient, pulmonology was consulted again given his ongoing hypoxia with further evaluation recommending esophagram given patient's symptoms.  Esophagram was notable for dysmotility, patient is likely having aspiration events versus chemical pneumonitis given his rapid onset and rapid resolution of symptoms and otherwise unconvincing PFTs and negative alpha antitrypsin in the outpatient setting.  Appreciate pulmonology's recommendations, close follow-up in the outpatient setting, given patient's respiratory status has now returned to baseline he is otherwise stable and agreeable for discharge home.  We also discussed possible home triggers including allergens.  She otherwise stable and agreeable for discharge.  Discharge Diagnoses:  Principal Problem:   Acute hypoxemic respiratory failure (HCC) Active Problems:   Hypertension associated with diabetes (HCC)   Thoracic aortic aneurysm (HCC)   Hyperlipidemia associated with type 2 diabetes mellitus (HCC)   Chronic diastolic CHF (congestive heart failure) (HCC)   Malignant neoplasm of left kidney (HCC)   GERD (gastroesophageal reflux disease)    Discharge Instructions   Allergies as of 02/20/2023       Reactions   Bee Venom Anaphylaxis   Other Itching, Swelling, Other (See Comments)   Feline dander- Runny nose, itchy/puffy eyes        Medication List     TAKE these medications    acetaminophen 500 MG tablet Commonly known as: TYLENOL Take 1,000 mg by mouth in the morning.   albuterol 108 (90 Base) MCG/ACT inhaler Commonly known as: VENTOLIN HFA Inhale 2 puffs into the lungs every 6 (six) hours as needed for wheezing or shortness of breath.   albuterol (2.5 MG/3ML) 0.083% nebulizer solution Commonly known as: PROVENTIL Take 3 mLs (2.5 mg total) by nebulization every 6 (six) hours as needed for wheezing  or shortness of breath.   amLODipine 10 MG tablet Commonly known as: NORVASC Take 10 mg by mouth  daily.   atorvastatin 80 MG tablet Commonly known as: LIPITOR Take 80 mg by mouth daily.   Breztri Aerosphere 160-9-4.8 MCG/ACT Aero Generic drug: Budeson-Glycopyrrol-Formoterol Inhale 2 puffs into the lungs 2 (two) times daily. Take 2 puffs first thing in am and then another 2 puffs about 12 hours later.   cetirizine 10 MG tablet Commonly known as: ZYRTEC Take 10 mg by mouth 2 (two) times daily.   EPINEPHrine 0.3 mg/0.3 mL Soaj injection Commonly known as: EPI-PEN Inject 0.3 mg into the muscle as needed for anaphylaxis.   ezetimibe 10 MG tablet Commonly known as: ZETIA Take 10 mg by mouth daily.   famotidine 20 MG tablet Commonly known as: Pepcid One after supper What changed:  how much to take how to take this when to take this additional instructions   fluticasone 50 MCG/ACT nasal spray Commonly known as: FLONASE Place 2 sprays into both nostrils in the morning and at bedtime.   ipratropium-albuterol 0.5-2.5 (3) MG/3ML Soln Commonly known as: DUONEB Take 3 mLs by nebulization every 6 (six) hours as needed (sob/wheezing).   Jardiance 25 MG Tabs tablet Generic drug: empagliflozin Take 25 mg by mouth daily.   levothyroxine 25 MCG tablet Commonly known as: Synthroid Take 1 tablet (25 mcg total) by mouth daily before breakfast.   losartan 100 MG tablet Commonly known as: COZAAR Take 100 mg by mouth daily.   metFORMIN 1000 MG tablet Commonly known as: GLUCOPHAGE Take 1,000 mg by mouth 2 (two) times daily with a meal.   montelukast 10 MG tablet Commonly known as: SINGULAIR Take 1 tablet (10 mg total) by mouth at bedtime.   Mounjaro 10 MG/0.5ML Pen Generic drug: tirzepatide Inject 10 mg into the skin once a week. What changed: when to take this   OneTouch Verio test strip Generic drug: glucose blood 1 each by Other route 5 (five) times daily.   pantoprazole 40 MG tablet Commonly known as: PROTONIX Take 1 tablet (40 mg total) by mouth 2 (two) times  daily. What changed: See the new instructions.   predniSONE 20 MG tablet Commonly known as: DELTASONE Take 1 tablet (20 mg total) by mouth 2 (two) times daily with a meal.   prochlorperazine 10 MG tablet Commonly known as: COMPAZINE Take 1 tablet (10 mg total) by mouth every 6 (six) hours as needed. What changed: reasons to take this   Repatha 140 MG/ML Sosy Generic drug: Evolocumab Inject 140 mg into the skin every 14 (fourteen) days.   tamsulosin 0.4 MG Caps capsule Commonly known as: FLOMAX Take 1 capsule (0.4 mg total) by mouth daily.        Allergies  Allergen Reactions   Bee Venom Anaphylaxis   Other Itching, Swelling and Other (See Comments)    Feline dander- Runny nose, itchy/puffy eyes    Consultations: Pulmonology  Procedures/Studies: DG ESOPHAGUS W SINGLE CM (SOL OR THIN BA)  Result Date: 02/20/2023 CLINICAL DATA:  Provided history: Dysphagia. Additional history: The patient reports dysphagia with solid foods, emesis after eating. EXAM: ESOPHAGUS/BARIUM SWALLOW/TABLET STUDY TECHNIQUE: A combined double and single contrast examination was performed using effervescent crystals, high-density barium, and thin liquid barium. The patient also swallowed a 13 mm barium tablet under fluoroscopy. The exam was performed by Alex Gardener, NP, and was supervised and interpreted by Dr. Jackey Loge. FLUOROSCOPY: Fluoroscopy time: 1 minute 54 seconds (23.50 mGy). COMPARISON:  Chest CT 02/19/2023. FINDINGS: Unremarkable appearance of the esophagus without evidence of fixed stricture, mass or mucosal abnormality. Moderate intermittent esophageal dysmotility with tertiary contractions. No appreciable hiatal hernia. No gastroesophageal reflux was observed. The patient swallowed a 13 mm barium tablet, which freely passed into the stomach. IMPRESSION: 1. Moderate esophageal dysmotility with tertiary contractions. 2. Otherwise unremarkable esophagram, as described. Electronically Signed   By:  Jackey Loge D.O.   On: 02/20/2023 13:19   CT Angio Chest PE W and/or Wo Contrast  Result Date: 02/19/2023 CLINICAL DATA:  Pulmonary embolism (PE) suspected, high prob. Shortness of breath EXAM: CT ANGIOGRAPHY CHEST WITH CONTRAST TECHNIQUE: Multidetector CT imaging of the chest was performed using the standard protocol during bolus administration of intravenous contrast. Multiplanar CT image reconstructions and MIPs were obtained to evaluate the vascular anatomy. RADIATION DOSE REDUCTION: This exam was performed according to the departmental dose-optimization program which includes automated exposure control, adjustment of the mA and/or kV according to patient size and/or use of iterative reconstruction technique. CONTRAST:  75mL OMNIPAQUE IOHEXOL 350 MG/ML SOLN COMPARISON:  11/26/2022 FINDINGS: Cardiovascular: No filling defects in the pulmonary arteries to suggest pulmonary emboli. Heart is normal size. Aorta is normal caliber. Extensive coronary artery and moderate aortic calcifications. Aneurysmal dilatation of the ascending thoracic aorta, 4.5 cm, stable. Mediastinum/Nodes: Stable mildly prominent right hilar lymph nodes. Scattered small mediastinal and left hilar lymph nodes, none pathologically enlarged. Trachea and esophagus are unremarkable. Lungs/Pleura: Calcified granuloma in the left lower lobe. No confluent opacities or effusions. Upper Abdomen: No acute findings Musculoskeletal: Chest wall soft tissues are unremarkable. No acute bony abnormality. Review of the MIP images confirms the above findings. IMPRESSION: No evidence of pulmonary embolus. Coronary artery disease. No acute cardiopulmonary disease. 4.5 cm ascending thoracic aortic aneurysm. Recommend semi-annual imaging followup by CTA or MRA and referral to cardiothoracic surgery if not already obtained. This recommendation follows 2010 ACCF/AHA/AATS/ACR/ASA/SCA/SCAI/SIR/STS/SVM Guidelines for the Diagnosis and Management of Patients With  Thoracic Aortic Disease. Circulation. 2010; 121: Z610-R604. Aortic aneurysm NOS (ICD10-I71.9) Aortic Atherosclerosis (ICD10-I70.0). Electronically Signed   By: Charlett Nose M.D.   On: 02/19/2023 21:39   DG Chest 2 View  Result Date: 02/19/2023 CLINICAL DATA:  Shortness of breath EXAM: CHEST - 2 VIEW COMPARISON:  11/26/2022 FINDINGS: The heart size and mediastinal contours are within normal limits. Both lungs are clear. The visualized skeletal structures are unremarkable except for degenerative changes throughout the spine. Trachea midline. IMPRESSION: No active cardiopulmonary disease. Electronically Signed   By: Judie Petit.  Shick M.D.   On: 02/19/2023 18:47     Subjective: No acute issues or events overnight denies nausea vomiting diarrhea constipation headache fevers chills shortness of breath or chest pain   Discharge Exam: Vitals:   02/20/23 0858 02/20/23 1357  BP: 138/88   Pulse: 89   Resp: 20   Temp:    SpO2: 96% 94%   Vitals:   02/20/23 0750 02/20/23 0858 02/20/23 1001 02/20/23 1357  BP:  138/88    Pulse:  89    Resp:  20    Temp:      TempSrc:      SpO2: 95% 96%  94%  Weight:   109 kg   Height:   5\' 10"  (1.778 m)     General: Pt is alert, awake, not in acute distress Cardiovascular: RRR, S1/S2 +, no rubs, no gallops Respiratory: CTA bilaterally, no wheezing, no rhonchi Abdominal: Soft, NT, ND, bowel sounds + Extremities: no edema, no cyanosis  The results of significant diagnostics from this hospitalization (including imaging, microbiology, ancillary and laboratory) are listed below for reference.     Microbiology: No results found for this or any previous visit (from the past 240 hour(s)).   Labs: BNP (last 3 results) Recent Labs    06/14/22 1440 11/26/22 1205 02/19/23 2027  BNP 27.9 20.7 17.8   Basic Metabolic Panel: Recent Labs  Lab 02/19/23 2027  NA 137  K 4.2  CL 101  CO2 24  GLUCOSE 127*  BUN 21*  CREATININE 1.47*  CALCIUM 9.5   Liver  Function Tests: Recent Labs  Lab 02/19/23 2027  AST 19  ALT 19  ALKPHOS 56  BILITOT 0.8  PROT 7.8  ALBUMIN 4.5   No results for input(s): "LIPASE", "AMYLASE" in the last 168 hours. No results for input(s): "AMMONIA" in the last 168 hours. CBC: Recent Labs  Lab 02/19/23 2027  WBC 10.8*  NEUTROABS 7.4  HGB 15.8  HCT 47.7  MCV 95.6  PLT 256   Cardiac Enzymes: Recent Labs  Lab 02/20/23 0915  CKTOTAL 256   CBG: Recent Labs  Lab 02/19/23 2259 02/20/23 0749 02/20/23 1214  GLUCAP 176* 214* 185*   Anemia work up Recent Labs    02/20/23 0915  FERRITIN 44   Urinalysis    Component Value Date/Time   COLORURINE STRAW (A) 06/15/2022 0456   APPEARANCEUR CLEAR 06/15/2022 0456   LABSPEC 1.030 06/15/2022 0456   PHURINE 5.0 06/15/2022 0456   GLUCOSEU >=500 (A) 06/15/2022 0456   HGBUR NEGATIVE 06/15/2022 0456   BILIRUBINUR NEGATIVE 06/15/2022 0456   KETONESUR NEGATIVE 06/15/2022 0456   PROTEINUR NEGATIVE 06/15/2022 0456   NITRITE NEGATIVE 06/15/2022 0456   LEUKOCYTESUR NEGATIVE 06/15/2022 0456   Sepsis Labs Recent Labs  Lab 02/19/23 2027  WBC 10.8*   Time coordinating discharge: Over 30 minutes  SIGNED:   Azucena Fallen, DO [facility] 02/20/2023, 1:58 PM Pager   If 7PM-7AM, please contact night-coverage [email or web address]

## 2023-02-20 NOTE — Progress Notes (Signed)
Transition of Care [facility]) - Inpatient Brief Assessment   Patient Details  Name: Russell Burns MRN: [id number] Date of Birth: August 30, 1966  Transition of Care [facility]) CM/SW Contact:    Lanier Clam, RN Phone Number: 02/20/2023, 10:37 AM   Clinical Narrative:    Transition of Care Asessment: Insurance and Status: Insurance coverage has been reviewed Patient has primary care physician: Yes Home environment has been reviewed: Home Prior level of function:: Indep Prior/Current Home Services: No current home services (Has nebulizer machine) Social Determinants of Health Reivew: SDOH reviewed no interventions necessary Readmission risk has been reviewed: Yes Transition of care needs: no transition of care needs at this time

## 2023-02-20 NOTE — Discharge Instructions (Signed)
Smaller, more frequent meals.  Do not eat 2 hours before bedtime. Avoid caffeine as able.

## 2023-02-20 NOTE — Progress Notes (Signed)
02/20/2023 Esophagram reviewed: rec protonix BID instead of daily, smaller more frequent meals, staying upright x 1 hour after eating and no eating 2 hours before bed.  Will arrange f/u with [facility]  Myrla Halsted MD PCCM

## 2023-02-21 ENCOUNTER — Encounter (INDEPENDENT_AMBULATORY_CARE_PROVIDER_SITE_OTHER): Payer: BC Managed Care – PPO | Admitting: Cardiology

## 2023-02-21 DIAGNOSIS — I7121 Aneurysm of the ascending aorta, without rupture: Secondary | ICD-10-CM

## 2023-02-21 LAB — ANCA PROFILE
Anti-MPO Antibodies: <0.2 units (ref 0.0–0.9)
Anti-PR3 Antibodies: <0.2 units (ref 0.0–0.9)
Atypical P-ANCA titer: <1:20 {titer}
C-ANCA: <1:20 {titer}
P-ANCA: <1:20 {titer}

## 2023-02-21 LAB — ANTI-SCLERODERMA ANTIBODY: Scleroderma (Scl-70) (ENA) Antibody, IgG: <0.2 AI (ref 0.0–0.9)

## 2023-02-21 LAB — ALDOLASE: Aldolase: 4 U/L (ref 3.3–10.3)

## 2023-02-21 LAB — CYCLIC CITRUL PEPTIDE ANTIBODY, IGG/IGA: CCP Antibodies IgG/IgA: 4 units (ref 0–19)

## 2023-02-21 LAB — HEMOGLOBIN A1C
Hgb A1c MFr Bld: 6.7 % — ABNORMAL HIGH (ref 4.8–5.6)
Mean Plasma Glucose: 146 mg/dL

## 2023-02-22 LAB — C3 COMPLEMENT: C3 Complement: 189 mg/dL — ABNORMAL HIGH (ref 82–167)

## 2023-02-22 LAB — RHEUMATOID FACTOR: Rheumatoid fact SerPl-aCnc: <10 IU/mL (ref <14.0)

## 2023-02-22 LAB — C4 COMPLEMENT: Complement C4, Body Fluid: 40 mg/dL — ABNORMAL HIGH (ref 12–38)

## 2023-02-22 NOTE — Telephone Encounter (Signed)
Please see the MyChart message reply(ies) for my assessment and plan.    This patient gave consent for this Medical Advice Message and is aware that it may result in a bill to their insurance company, as well as the possibility of receiving a bill for a co-payment or deductible. They are an established patient, but are not seeking medical advice exclusively about a problem treated during an in person or video visit in the last seven days. I did not recommend an in person or video visit within seven days of my reply.    I spent a total of 6 minutes cumulative time within 7 days through MyChart messaging.  Locke Barrell, MD   

## 2023-02-25 LAB — ANTINUCLEAR ANTIBODIES, IFA: ANA Ab, IFA: NEGATIVE

## 2023-02-25 NOTE — Telephone Encounter (Signed)
Patient is scheduled first available "regular" opening, 04/10/2023 with Dr. Tonia Brooms. Nothing further needed.

## 2023-02-26 DIAGNOSIS — G4733 Obstructive sleep apnea (adult) (pediatric): Secondary | ICD-10-CM | POA: Diagnosis not present

## 2023-02-26 DIAGNOSIS — I10 Essential (primary) hypertension: Secondary | ICD-10-CM | POA: Diagnosis not present

## 2023-02-27 DIAGNOSIS — E1165 Type 2 diabetes mellitus with hyperglycemia: Secondary | ICD-10-CM | POA: Diagnosis not present

## 2023-02-28 ENCOUNTER — Inpatient Hospital Stay (HOSPITAL_BASED_OUTPATIENT_CLINIC_OR_DEPARTMENT_OTHER): Payer: BC Managed Care – PPO | Admitting: Internal Medicine

## 2023-02-28 ENCOUNTER — Other Ambulatory Visit: Payer: Self-pay

## 2023-02-28 ENCOUNTER — Inpatient Hospital Stay: Payer: BC Managed Care – PPO | Attending: Oncology

## 2023-02-28 ENCOUNTER — Encounter: Payer: Self-pay | Admitting: Medical Oncology

## 2023-02-28 ENCOUNTER — Inpatient Hospital Stay: Payer: BC Managed Care – PPO

## 2023-02-28 VITALS — BP 135/86 | HR 73 | Resp 18

## 2023-02-28 DIAGNOSIS — Z905 Acquired absence of kidney: Secondary | ICD-10-CM | POA: Diagnosis not present

## 2023-02-28 DIAGNOSIS — C642 Malignant neoplasm of left kidney, except renal pelvis: Secondary | ICD-10-CM

## 2023-02-28 DIAGNOSIS — Z5112 Encounter for antineoplastic immunotherapy: Secondary | ICD-10-CM | POA: Insufficient documentation

## 2023-02-28 DIAGNOSIS — E039 Hypothyroidism, unspecified: Secondary | ICD-10-CM | POA: Diagnosis not present

## 2023-02-28 LAB — CBC WITH DIFFERENTIAL (CANCER CENTER ONLY)
Abs Immature Granulocytes: 0.27 10*3/uL — ABNORMAL HIGH (ref 0.00–0.07)
Basophils Absolute: 0 10*3/uL (ref 0.0–0.1)
Basophils Relative: 0 %
Eosinophils Absolute: 0 10*3/uL (ref 0.0–0.5)
Eosinophils Relative: 0 %
HCT: 42.3 % (ref 39.0–52.0)
Hemoglobin: 15.3 g/dL (ref 13.0–17.0)
Immature Granulocytes: 2 %
Lymphocytes Relative: 12 %
Lymphs Abs: 1.5 10*3/uL (ref 0.7–4.0)
MCH: 32.8 pg (ref 26.0–34.0)
MCHC: 36.2 g/dL — ABNORMAL HIGH (ref 30.0–36.0)
MCV: 90.8 fL (ref 80.0–100.0)
Monocytes Absolute: 0.7 10*3/uL (ref 0.1–1.0)
Monocytes Relative: 5 %
Neutro Abs: 10.8 10*3/uL — ABNORMAL HIGH (ref 1.7–7.7)
Neutrophils Relative %: 81 %
Platelet Count: 293 10*3/uL (ref 150–400)
RBC: 4.66 MIL/uL (ref 4.22–5.81)
RDW: 12.2 % (ref 11.5–15.5)
WBC Count: 13.4 10*3/uL — ABNORMAL HIGH (ref 4.0–10.5)
nRBC: 0 % (ref 0.0–0.2)

## 2023-02-28 LAB — CMP (CANCER CENTER ONLY)
ALT: 13 U/L (ref 0–44)
AST: 11 U/L — ABNORMAL LOW (ref 15–41)
Albumin: 4.3 g/dL (ref 3.5–5.0)
Alkaline Phosphatase: 50 U/L (ref 38–126)
Anion gap: 9 (ref 5–15)
BUN: 34 mg/dL — ABNORMAL HIGH (ref 6–20)
CO2: 23 mmol/L (ref 22–32)
Calcium: 9.4 mg/dL (ref 8.9–10.3)
Chloride: 101 mmol/L (ref 98–111)
Creatinine: 1.21 mg/dL (ref 0.61–1.24)
GFR, Estimated: >60 mL/min (ref >60)
Glucose, Bld: 171 mg/dL — ABNORMAL HIGH (ref 70–99)
Potassium: 4.8 mmol/L (ref 3.5–5.1)
Sodium: 133 mmol/L — ABNORMAL LOW (ref 135–145)
Total Bilirubin: 0.6 mg/dL (ref 0.3–1.2)
Total Protein: 6.9 g/dL (ref 6.5–8.1)

## 2023-02-28 MED ORDER — SODIUM CHLORIDE 0.9 % IV SOLN: Freq: Once | INTRAVENOUS | Status: AC

## 2023-02-28 MED ORDER — SODIUM CHLORIDE 0.9 % IV SOLN
200.0000 mg | Freq: Once | INTRAVENOUS | Status: AC
  Administered 2023-02-28: 200 mg via INTRAVENOUS
  Filled 2023-02-28: qty 200

## 2023-02-28 NOTE — Progress Notes (Signed)
[facility] [phone]   Fax:([phone]  OFFICE PROGRESS NOTE  Russell Corn, MD [address] 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  PRIOR THERAPY: Status post left laparoscopic radical nephrectomy on 03/12/2022 under the care of Dr. 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  CURRENT THERAPY: Adjuvant immunotherapy with Keytruda 200 Mg IV every 3 weeks status post 12 cycles.  INTERVAL HISTORY: Russell Burns III 57 y.o. male returns to the clinic today for follow-up visit.  The patient is feeling fine today with no concerning complaints but he was seen in the hospital less than 2 weeks ago with significant shortness of breath.  He had CT angiogram of the chest performed on 02/19/2023 that showed no evidence of pulmonary embolism and no acute cardiopulmonary disease but he had evidence for coronary artery disease and the persistent 4.5 cm ascending thoracic aortic aneurysm.  He was treated with a steroid and felt much better.  He was seen by Dr. Katrinka Blazing and Dr. Tonia Brooms during his hospitalization.  There was no clear evidence for immunotherapy mediated pneumonitis on the scan.  The patient is currently on prednisone 20 mg p.o. daily and expected to complete the last dose tomorrow.  He has no nausea, vomiting, diarrhea or constipation.  He has no headache or visual changes.  He has no recent weight loss or night sweats.  He is here today for evaluation before starting cycle #13 of his treatment with Keytruda.   MEDICAL HISTORY: Past Medical History:  Diagnosis Date   Allergy    Aortic stenosis    Arthritis    Blood transfusion without reported diagnosis    Cancer (HCC)    Chronic kidney disease    Diabetes mellitus without  complication (HCC)    FH: thoracic aneurysm    Hyperlipidemia    Hypertension    Obesity     ALLERGIES:  is allergic to bee venom and other.  MEDICATIONS:  Current Outpatient Medications  Medication Sig Dispense Refill   acetaminophen (TYLENOL) 500 MG tablet Take 1,000 mg by mouth in the morning.     albuterol (PROVENTIL) (2.5 MG/3ML) 0.083% nebulizer solution Take 3 mLs (2.5 mg total) by nebulization every 6 (six) hours as needed for wheezing or shortness of breath. 75 mL 12   albuterol (VENTOLIN HFA) 108 (90 Base) MCG/ACT inhaler Inhale 2 puffs into the lungs every 6 (six) hours as needed for wheezing or shortness of breath.     amLODipine (NORVASC) 10 MG tablet Take 10 mg by mouth daily.     atorvastatin (LIPITOR) 80 MG tablet Take 80 mg by mouth daily.     Budeson-Glycopyrrol-Formoterol (BREZTRI AEROSPHERE) 160-9-4.8 MCG/ACT AERO Inhale 2 puffs into the lungs 2 (two) times daily. Take 2 puffs first thing in am and then another 2 puffs about 12 hours later. 10.7 g 11   cetirizine (ZYRTEC) 10 MG tablet Take 10 mg by mouth 2 (two) times daily.     EPINEPHrine 0.3 mg/0.3 mL IJ SOAJ injection Inject 0.3 mg into the muscle as needed for anaphylaxis.     ezetimibe (ZETIA) 10 MG tablet Take 10 mg by mouth daily.     famotidine (PEPCID) 20 MG tablet One after supper (Patient taking differently: Take 20 mg by  mouth daily after supper.) 30 tablet 11   fluticasone (FLONASE) 50 MCG/ACT nasal spray Place 2 sprays into both nostrils in the morning and at bedtime.     ipratropium-albuterol (DUONEB) 0.5-2.5 (3) MG/3ML SOLN Take 3 mLs by nebulization every 6 (six) hours as needed (sob/wheezing).     JARDIANCE 25 MG TABS tablet Take 25 mg by mouth daily.     levothyroxine (SYNTHROID) 25 MCG tablet Take 1 tablet (25 mcg total) by mouth daily before breakfast. 30 tablet 2   losartan (COZAAR) 100 MG tablet Take 100 mg by mouth daily.     metFORMIN (GLUCOPHAGE) 1000 MG tablet Take 1,000 mg by mouth 2 (two)  times daily with a meal.     montelukast (SINGULAIR) 10 MG tablet Take 1 tablet (10 mg total) by mouth at bedtime. 30 tablet 0   ONETOUCH VERIO test strip 1 each by Other route 5 (five) times daily.     pantoprazole (PROTONIX) 40 MG tablet Take 1 tablet (40 mg total) by mouth 2 (two) times daily. 60 tablet 0   predniSONE (DELTASONE) 20 MG tablet Take 1 tablet (20 mg total) by mouth 2 (two) times daily with a meal. 14 tablet 0   prochlorperazine (COMPAZINE) 10 MG tablet Take 1 tablet (10 mg total) by mouth every 6 (six) hours as needed. (Patient taking differently: Take 10 mg by mouth every 6 (six) hours as needed for nausea or vomiting.) 30 tablet 0   REPATHA 140 MG/ML SOSY Inject 140 mg into the skin every 14 (fourteen) days.     tamsulosin (FLOMAX) 0.4 MG CAPS capsule Take 1 capsule (0.4 mg total) by mouth daily. 14 capsule 0   tirzepatide (MOUNJARO) 10 MG/0.5ML Pen Inject 10 mg into the skin once a week. (Patient taking differently: Inject 10 mg into the skin every Friday.) 6 mL 3   No current facility-administered medications for this visit.    SURGICAL HISTORY:  Past Surgical History:  Procedure Laterality Date   CYSTOSCOPY WITH URETEROSCOPY AND STENT PLACEMENT Left 02/12/2022   Procedure: CYSTOSCOPY WITH LEFT RETROGRADE PYELOGRAM, DIAGNOSTIC URETEROSCOPY AND STENT PLACEMENT;  Surgeon: Crista Elliot, MD;  Location: [facility];  Service: Urology;  Laterality: Left;   LAPAROSCOPIC NEPHRECTOMY, HAND ASSISTED Left 03/12/2022   Procedure: LEFT HAND ASSISTED LAPAROSCOPIC NEPHRECTOMY;  Surgeon: Crista Elliot, MD;  Location: [facility];  Service: Urology;  Laterality: Left;  NEED 2.5 HRS FOR THIS CASE   TONSILLECTOMY     WISDOM TOOTH EXTRACTION      REVIEW OF SYSTEMS:  A comprehensive review of systems was negative.   PHYSICAL EXAMINATION: General appearance: alert, cooperative, and no distress Head: Normocephalic, without obvious abnormality, atraumatic Neck: no adenopathy, no JVD, supple,  symmetrical, trachea midline, and thyroid not enlarged, symmetric, no tenderness/mass/nodules Lymph nodes: Cervical, supraclavicular, and axillary nodes normal. Resp: clear to auscultation bilaterally Back: symmetric, no curvature. ROM normal. No CVA tenderness. Cardio: regular rate and rhythm, S1, S2 normal, no murmur, click, rub or gallop GI: soft, non-tender; bowel sounds normal; no masses,  no organomegaly Extremities: extremities normal, atraumatic, no cyanosis or edema  ECOG PERFORMANCE STATUS: 0 - Asymptomatic  Blood pressure (!) 146/88, pulse 73, temperature (!) 97.5 F (36.4 C), temperature source Oral, resp. rate 18, weight 245 lb (111.1 kg), SpO2 97 %.  LABORATORY DATA: Lab Results  Component Value Date   WBC 13.4 (H) 02/28/2023   HGB 15.3 02/28/2023   HCT 42.3 02/28/2023   MCV 90.8 02/28/2023  PLT 293 02/28/2023      Chemistry      Component Value Date/Time   NA 133 (L) 02/28/2023 1056   NA 139 07/25/2022 0935   K 4.8 02/28/2023 1056   CL 101 02/28/2023 1056   CO2 23 02/28/2023 1056   BUN 34 (H) 02/28/2023 1056   BUN 23 07/25/2022 0935   CREATININE 1.21 02/28/2023 1056      Component Value Date/Time   CALCIUM 9.4 02/28/2023 1056   ALKPHOS 50 02/28/2023 1056   AST 11 (L) 02/28/2023 1056   ALT 13 02/28/2023 1056   BILITOT 0.6 02/28/2023 1056       RADIOGRAPHIC STUDIES: ECHOCARDIOGRAM COMPLETE  Result Date: 02/20/2023    ECHOCARDIOGRAM REPORT   Patient Name:   Russell Burns III Date of Exam: 02/20/2023 Medical Rec #:  [id number]         Height:       70.0 in Accession #:    [id number]        Weight:       240.4 lb Date of Birth:  April 13, 1966         BSA:          2.257 m Patient Age:    57 years          BP:           124/83 mmHg Patient Gender: M                 HR:           83 bpm. Exam Location:  Inpatient Procedure: 2D Echo, Cardiac Doppler, Color Doppler and Intracardiac            Opacification Agent Indications:    Aortic Stenosis  History:         Patient has prior history of Echocardiogram examinations, most                 recent 09/29/2021. CHF; Risk Factors:Hypertension and Diabetes.  Sonographer:    Lucy Antigua Referring Phys: [id number] John Giovanni  Sonographer Comments: Technically difficult study due to poor echo windows. IMPRESSIONS  1. Left ventricular ejection fraction, by estimation, is 60 to 65%. The left ventricle has normal function. Left ventricular endocardial border not optimally defined to evaluate regional wall motion. Left ventricular diastolic parameters are consistent with Grade I diastolic dysfunction (impaired relaxation).  2. Right ventricular systolic function is normal. The right ventricular size is normal. Tricuspid regurgitation signal is inadequate for assessing PA pressure.  3. The mitral valve is grossly normal. No evidence of mitral valve regurgitation. No evidence of mitral stenosis.  4. DVI 0.29; Suspect Siever's 1 Bicuspid with R-L calcified raphe. The aortic valve was not well visualized. Aortic valve regurgitation is not visualized. Moderate to severe aortic valve stenosis. Aortic valve mean gradient measures 37.0 mmHg. Aortic valve Vmax measures 3.65 m/s. Comparison(s): Prior images reviewed side by side. Futher increase in aortic valve gradients. Patient ascending aortic aneurysm, seen best on prior CT, is not well visualized in this study. FINDINGS  Left Ventricle: Left ventricular ejection fraction, by estimation, is 60 to 65%. The left ventricle has normal function. Left ventricular endocardial border not optimally defined to evaluate regional wall motion. Definity contrast agent was given IV to delineate the left ventricular endocardial borders. The left ventricular internal cavity size was normal in size. Suboptimal image quality limits for assessment of left ventricular hypertrophy. Left ventricular diastolic parameters are consistent with Grade I diastolic dysfunction (impaired  relaxation). Right Ventricle:  The right ventricular size is normal. No increase in right ventricular wall thickness. Right ventricular systolic function is normal. Tricuspid regurgitation signal is inadequate for assessing PA pressure. Left Atrium: Left atrial size was normal in size. Right Atrium: Right atrial size was normal in size. Prominent Eustachian valve. Pericardium: There is no evidence of pericardial effusion. Mitral Valve: The mitral valve is grossly normal. No evidence of mitral valve regurgitation. No evidence of mitral valve stenosis. Tricuspid Valve: The tricuspid valve is grossly normal. Tricuspid valve regurgitation is not demonstrated. No evidence of tricuspid stenosis. Aortic Valve: DVI 0.29; Suspect Siever's 1 Bicuspid with R-L calcified raphe. The aortic valve was not well visualized. Aortic valve regurgitation is not visualized. Moderate to severe aortic stenosis is present. Aortic valve mean gradient measures 37.0 mmHg. Aortic valve peak gradient measures 53.3 mmHg. Aortic valve area, by VTI measures 1.31 cm. Pulmonic Valve: The pulmonic valve was not well visualized. Pulmonic valve regurgitation is not visualized. Aorta: The aortic root is normal in size and structure and the ascending aorta was not well visualized. IAS/Shunts: The interatrial septum was not well visualized.  LEFT VENTRICLE PLAX 2D LVOT diam:     2.40 cm   Diastology LV SV:         96        LV e' medial:    6.85 cm/s LV SV Index:   42        LV E/e' medial:  7.9 LVOT Area:     4.52 cm  LV e' lateral:   8.49 cm/s                          LV E/e' lateral: 6.4  RIGHT VENTRICLE RV S prime:     13.30 cm/s TAPSE (M-mode): 1.9 cm LEFT ATRIUM             Index        RIGHT ATRIUM           Index LA Vol (A2C):   47.2 ml 20.91 ml/m  RA Area:     14.50 cm LA Vol (A4C):   48.5 ml 21.49 ml/m  RA Volume:   34.10 ml  15.11 ml/m LA Biplane Vol: 50.3 ml 22.29 ml/m  AORTIC VALVE AV Area (Vmax):    1.21 cm AV Area (Vmean):   1.00 cm AV Area (VTI):     1.31 cm AV  Vmax:           365.00 cm/s AV Vmean:          279.000 cm/s AV VTI:            0.734 m AV Peak Grad:      53.3 mmHg AV Mean Grad:      37.0 mmHg LVOT Vmax:         97.30 cm/s LVOT Vmean:        61.700 cm/s LVOT VTI:          0.212 m LVOT/AV VTI ratio: 0.29  AORTA Ao Root diam: 3.30 cm MITRAL VALVE MV Area (PHT): 4.21 cm    SHUNTS MV Decel Time: 180 msec    Systemic VTI:  0.21 m MV E velocity: 54.40 cm/s  Systemic Diam: 2.40 cm MV A velocity: 81.40 cm/s MV E/A ratio:  0.67 Riley Lam MD Electronically signed by Riley Lam MD Signature Date/Time: 02/20/2023/2:21:07 PM    Final    DG ESOPHAGUS W SINGLE  CM (SOL OR THIN BA)  Result Date: 02/20/2023 CLINICAL DATA:  Provided history: Dysphagia. Additional history: The patient reports dysphagia with solid foods, emesis after eating. EXAM: ESOPHAGUS/BARIUM SWALLOW/TABLET STUDY TECHNIQUE: A combined double and single contrast examination was performed using effervescent crystals, high-density barium, and thin liquid barium. The patient also swallowed a 13 mm barium tablet under fluoroscopy. The exam was performed by Alex Gardener, NP, and was supervised and interpreted by Dr. Jackey Loge. FLUOROSCOPY: Fluoroscopy time: 1 minute 54 seconds (23.50 mGy). COMPARISON:  Chest CT 02/19/2023. FINDINGS: Unremarkable appearance of the esophagus without evidence of fixed stricture, mass or mucosal abnormality. Moderate intermittent esophageal dysmotility with tertiary contractions. No appreciable hiatal hernia. No gastroesophageal reflux was observed. The patient swallowed a 13 mm barium tablet, which freely passed into the stomach. IMPRESSION: 1. Moderate esophageal dysmotility with tertiary contractions. 2. Otherwise unremarkable esophagram, as described. Electronically Signed   By: Jackey Loge D.O.   On: 02/20/2023 13:19   CT Angio Chest PE W and/or Wo Contrast  Result Date: 02/19/2023 CLINICAL DATA:  Pulmonary embolism (PE) suspected, high prob. Shortness  of breath EXAM: CT ANGIOGRAPHY CHEST WITH CONTRAST TECHNIQUE: Multidetector CT imaging of the chest was performed using the standard protocol during bolus administration of intravenous contrast. Multiplanar CT image reconstructions and MIPs were obtained to evaluate the vascular anatomy. RADIATION DOSE REDUCTION: This exam was performed according to the departmental dose-optimization program which includes automated exposure control, adjustment of the mA and/or kV according to patient size and/or use of iterative reconstruction technique. CONTRAST:  75mL OMNIPAQUE IOHEXOL 350 MG/ML SOLN COMPARISON:  11/26/2022 FINDINGS: Cardiovascular: No filling defects in the pulmonary arteries to suggest pulmonary emboli. Heart is normal size. Aorta is normal caliber. Extensive coronary artery and moderate aortic calcifications. Aneurysmal dilatation of the ascending thoracic aorta, 4.5 cm, stable. Mediastinum/Nodes: Stable mildly prominent right hilar lymph nodes. Scattered small mediastinal and left hilar lymph nodes, none pathologically enlarged. Trachea and esophagus are unremarkable. Lungs/Pleura: Calcified granuloma in the left lower lobe. No confluent opacities or effusions. Upper Abdomen: No acute findings Musculoskeletal: Chest wall soft tissues are unremarkable. No acute bony abnormality. Review of the MIP images confirms the above findings. IMPRESSION: No evidence of pulmonary embolus. Coronary artery disease. No acute cardiopulmonary disease. 4.5 cm ascending thoracic aortic aneurysm. Recommend semi-annual imaging followup by CTA or MRA and referral to cardiothoracic surgery if not already obtained. This recommendation follows 2010 ACCF/AHA/AATS/ACR/ASA/SCA/SCAI/SIR/STS/SVM Guidelines for the Diagnosis and Management of Patients With Thoracic Aortic Disease. Circulation. 2010; 121: Z610-R604. Aortic aneurysm NOS (ICD10-I71.9) Aortic Atherosclerosis (ICD10-I70.0). Electronically Signed   By: Charlett Nose M.D.   On:  02/19/2023 21:39   DG Chest 2 View  Result Date: 02/19/2023 CLINICAL DATA:  Shortness of breath EXAM: CHEST - 2 VIEW COMPARISON:  11/26/2022 FINDINGS: The heart size and mediastinal contours are within normal limits. Both lungs are clear. The visualized skeletal structures are unremarkable except for degenerative changes throughout the spine. Trachea midline. IMPRESSION: No active cardiopulmonary disease. Electronically Signed   By: Judie Petit.  Shick M.D.   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  He is status post left laparoscopic radical nephrectomy on 03/12/2022 under the care of Dr. Alvester Morin and the final pathology showed clear-cell renal cell carcinoma nuclear grade 4 measuring 7.0 cm with tumor extension into the renal  vein and renal sinus. The patient is currently undergoing treatment with Adjuvant immunotherapy with Keytruda 200 Mg IV every 3 weeks status post 12 cycles. The patient has been tolerating this treatment fairly well but recently had an episode of respiratory distress and treated with steroid and felt much better.  This could be secondary to his treatment with [facility] but there was no evidence of immunotherapy mediated pneumonitis on his imaging studies and it could be also secondary to his COPD and asthma. He is now followed by Dr. Tonia Brooms from pulmonary medicine.  He is finishing a treatment with prednisone tomorrow. I recommended for the patient to proceed with his treatment with [facility] today but if he develop any additional episodes of shortness of breath after the treatment, I will discontinue his adjuvant treatment with Keytruda at that point. For the hypothyroidism, he will continue his current treatment with levothyroxine. The patient will come back for follow-up visit in 3 weeks for evaluation before the next  cycle of his treatment. He was advised to call immediately if he has any concerning symptoms in the interval. The patient voices understanding of current disease status and treatment options and is in agreement with the current care plan.  All questions were answered. The patient knows to call the clinic with any problems, questions or concerns. We can certainly see the patient much sooner if necessary.  The total time spent in the appointment was 20 minutes.  Disclaimer: This note was dictated with voice recognition software. Similar sounding words can inadvertently be transcribed and may not be corrected upon review.

## 2023-02-28 NOTE — Progress Notes (Signed)
Patient seen by Dr. Mohamed  Vitals are within treatment parameters.  Labs reviewed: and are within treatment parameters.  Per physician team, patient is ready for treatment and there are NO modifications to the treatment plan.  Pembrolizumab. 

## 2023-02-28 NOTE — Patient Instructions (Signed)
[facility]  Discharge Instructions: Thank you for choosing [facility] to provide your oncology and hematology care.   If you have a lab appointment with the Cancer Center, please go directly to the Cancer Center and check in at the registration area.   Wear comfortable clothing and clothing appropriate for easy access to any Portacath or PICC line.   We strive to give you quality time with your provider. You may need to reschedule your appointment if you arrive late (15 or more minutes).  Arriving late affects you and other patients whose appointments are after yours.  Also, if you miss three or more appointments without notifying the office, you may be dismissed from the clinic at the provider's discretion.      For prescription refill requests, have your pharmacy contact our office and allow 72 hours for refills to be completed.    Today you received the following chemotherapy and/or immunotherapy agents: Keytruda      To help prevent nausea and vomiting after your treatment, we encourage you to take your nausea medication as directed.  BELOW ARE SYMPTOMS THAT SHOULD BE REPORTED IMMEDIATELY: *FEVER GREATER THAN 100.4 F (38 C) OR HIGHER *CHILLS OR SWEATING *NAUSEA AND VOMITING THAT IS NOT CONTROLLED WITH YOUR NAUSEA MEDICATION *UNUSUAL SHORTNESS OF BREATH *UNUSUAL BRUISING OR BLEEDING *URINARY PROBLEMS (pain or burning when urinating, or frequent urination) *BOWEL PROBLEMS (unusual diarrhea, constipation, pain near the anus) TENDERNESS IN MOUTH AND THROAT WITH OR WITHOUT PRESENCE OF ULCERS (sore throat, sores in mouth, or a toothache) UNUSUAL RASH, SWELLING OR PAIN  UNUSUAL VAGINAL DISCHARGE OR ITCHING   Items with * indicate a potential emergency and should be followed up as soon as possible or go to the Emergency Department if any problems should occur.  Please show the CHEMOTHERAPY ALERT CARD or IMMUNOTHERAPY ALERT CARD at  check-in to the Emergency Department and triage nurse.  Should you have questions after your visit or need to cancel or reschedule your appointment, please contact [facility]  Dept: [phone]  and follow the prompts.  Office hours are 8:00 a.m. to 4:30 p.m. Monday - Friday. Please note that voicemails left after 4:00 p.m. may not be returned until the following business day.  We are closed weekends and major holidays. You have access to a nurse at all times for urgent questions. Please call the main number to the clinic Dept: [phone] and follow the prompts.   For any non-urgent questions, you may also contact your provider using MyChart. We now offer e-Visits for anyone 18 and older to request care online for non-urgent symptoms. For details visit [email or web address].   Also download the MyChart app! Go to the app store, search "MyChart", open the app, select , and log in with your MyChart username and password.   

## 2023-03-05 DIAGNOSIS — C642 Malignant neoplasm of left kidney, except renal pelvis: Secondary | ICD-10-CM | POA: Diagnosis not present

## 2023-03-06 DIAGNOSIS — D225 Melanocytic nevi of trunk: Secondary | ICD-10-CM | POA: Diagnosis not present

## 2023-03-06 DIAGNOSIS — L905 Scar conditions and fibrosis of skin: Secondary | ICD-10-CM | POA: Diagnosis not present

## 2023-03-06 DIAGNOSIS — D485 Neoplasm of uncertain behavior of skin: Secondary | ICD-10-CM | POA: Diagnosis not present

## 2023-03-06 DIAGNOSIS — L814 Other melanin hyperpigmentation: Secondary | ICD-10-CM | POA: Diagnosis not present

## 2023-03-08 DIAGNOSIS — K5730 Diverticulosis of large intestine without perforation or abscess without bleeding: Secondary | ICD-10-CM | POA: Diagnosis not present

## 2023-03-08 DIAGNOSIS — C642 Malignant neoplasm of left kidney, except renal pelvis: Secondary | ICD-10-CM | POA: Diagnosis not present

## 2023-03-08 DIAGNOSIS — R911 Solitary pulmonary nodule: Secondary | ICD-10-CM | POA: Diagnosis not present

## 2023-03-10 ENCOUNTER — Encounter: Payer: Self-pay | Admitting: Internal Medicine

## 2023-03-12 ENCOUNTER — Other Ambulatory Visit: Payer: Self-pay | Admitting: Physician Assistant

## 2023-03-12 DIAGNOSIS — C642 Malignant neoplasm of left kidney, except renal pelvis: Secondary | ICD-10-CM

## 2023-03-14 DIAGNOSIS — H524 Presbyopia: Secondary | ICD-10-CM | POA: Diagnosis not present

## 2023-03-14 DIAGNOSIS — E119 Type 2 diabetes mellitus without complications: Secondary | ICD-10-CM | POA: Diagnosis not present

## 2023-03-14 DIAGNOSIS — H5213 Myopia, bilateral: Secondary | ICD-10-CM | POA: Diagnosis not present

## 2023-03-14 DIAGNOSIS — H52223 Regular astigmatism, bilateral: Secondary | ICD-10-CM | POA: Diagnosis not present

## 2023-03-14 DIAGNOSIS — H2513 Age-related nuclear cataract, bilateral: Secondary | ICD-10-CM | POA: Diagnosis not present

## 2023-03-21 ENCOUNTER — Other Ambulatory Visit: Payer: Self-pay | Admitting: Internal Medicine

## 2023-03-21 ENCOUNTER — Other Ambulatory Visit: Payer: Self-pay

## 2023-03-21 ENCOUNTER — Inpatient Hospital Stay (HOSPITAL_BASED_OUTPATIENT_CLINIC_OR_DEPARTMENT_OTHER): Payer: BC Managed Care – PPO | Admitting: Internal Medicine

## 2023-03-21 ENCOUNTER — Inpatient Hospital Stay: Payer: BC Managed Care – PPO

## 2023-03-21 VITALS — BP 128/87 | HR 74 | Resp 16

## 2023-03-21 DIAGNOSIS — C642 Malignant neoplasm of left kidney, except renal pelvis: Secondary | ICD-10-CM

## 2023-03-21 DIAGNOSIS — Z905 Acquired absence of kidney: Secondary | ICD-10-CM | POA: Diagnosis not present

## 2023-03-21 DIAGNOSIS — E039 Hypothyroidism, unspecified: Secondary | ICD-10-CM | POA: Diagnosis not present

## 2023-03-21 DIAGNOSIS — Z5112 Encounter for antineoplastic immunotherapy: Secondary | ICD-10-CM | POA: Diagnosis not present

## 2023-03-21 LAB — CBC WITH DIFFERENTIAL (CANCER CENTER ONLY)
Abs Immature Granulocytes: 0.01 10*3/uL (ref 0.00–0.07)
Basophils Absolute: 0.1 10*3/uL (ref 0.0–0.1)
Basophils Relative: 1 %
Eosinophils Absolute: 0.4 10*3/uL (ref 0.0–0.5)
Eosinophils Relative: 10 %
HCT: 43.4 % (ref 39.0–52.0)
Hemoglobin: 14.9 g/dL (ref 13.0–17.0)
Immature Granulocytes: 0 %
Lymphocytes Relative: 36 %
Lymphs Abs: 1.6 10*3/uL (ref 0.7–4.0)
MCH: 32.5 pg (ref 26.0–34.0)
MCHC: 34.3 g/dL (ref 30.0–36.0)
MCV: 94.8 fL (ref 80.0–100.0)
Monocytes Absolute: 0.4 10*3/uL (ref 0.1–1.0)
Monocytes Relative: 8 %
Neutro Abs: 2 10*3/uL (ref 1.7–7.7)
Neutrophils Relative %: 45 %
Platelet Count: 223 10*3/uL (ref 150–400)
RBC: 4.58 MIL/uL (ref 4.22–5.81)
RDW: 12.8 % (ref 11.5–15.5)
WBC Count: 4.4 10*3/uL (ref 4.0–10.5)
nRBC: 0 % (ref 0.0–0.2)

## 2023-03-21 LAB — CMP (CANCER CENTER ONLY)
ALT: 21 U/L (ref 0–44)
AST: 16 U/L (ref 15–41)
Albumin: 4.3 g/dL (ref 3.5–5.0)
Alkaline Phosphatase: 61 U/L (ref 38–126)
Anion gap: 10 (ref 5–15)
BUN: 18 mg/dL (ref 6–20)
CO2: 24 mmol/L (ref 22–32)
Calcium: 9 mg/dL (ref 8.9–10.3)
Chloride: 105 mmol/L (ref 98–111)
Creatinine: 1.32 mg/dL — ABNORMAL HIGH (ref 0.61–1.24)
GFR, Estimated: >60 mL/min (ref >60)
Glucose, Bld: 156 mg/dL — ABNORMAL HIGH (ref 70–99)
Potassium: 4.6 mmol/L (ref 3.5–5.1)
Sodium: 139 mmol/L (ref 135–145)
Total Bilirubin: 0.3 mg/dL (ref 0.3–1.2)
Total Protein: 7.3 g/dL (ref 6.5–8.1)

## 2023-03-21 MED ORDER — SODIUM CHLORIDE 0.9 % IV SOLN
200.0000 mg | Freq: Once | INTRAVENOUS | Status: AC
  Administered 2023-03-21: 200 mg via INTRAVENOUS
  Filled 2023-03-21: qty 200

## 2023-03-21 MED ORDER — SODIUM CHLORIDE 0.9 % IV SOLN: Freq: Once | INTRAVENOUS | Status: AC

## 2023-03-21 NOTE — Progress Notes (Signed)
This nurse received FMLA paperwork from [facility] [employer] [facility] [employer] today for this patient.  Submitted to [facility] and Disability department and advised patient they have a turnaround time of 10-14 business day.  Patient acknowledged understanding.

## 2023-03-21 NOTE — Patient Instructions (Signed)
[facility]  Discharge Instructions: Thank you for choosing [facility] to provide your oncology and hematology care.   If you have a lab appointment with the Cancer Center, please go directly to the Cancer Center and check in at the registration area.   Wear comfortable clothing and clothing appropriate for easy access to any Portacath or PICC line.   We strive to give you quality time with your provider. You may need to reschedule your appointment if you arrive late (15 or more minutes).  Arriving late affects you and other patients whose appointments are after yours.  Also, if you miss three or more appointments without notifying the office, you may be dismissed from the clinic at the provider's discretion.      For prescription refill requests, have your pharmacy contact our office and allow 72 hours for refills to be completed.    Today you received the following chemotherapy and/or immunotherapy agents: Keytruda      To help prevent nausea and vomiting after your treatment, we encourage you to take your nausea medication as directed.  BELOW ARE SYMPTOMS THAT SHOULD BE REPORTED IMMEDIATELY: *FEVER GREATER THAN 100.4 F (38 C) OR HIGHER *CHILLS OR SWEATING *NAUSEA AND VOMITING THAT IS NOT CONTROLLED WITH YOUR NAUSEA MEDICATION *UNUSUAL SHORTNESS OF BREATH *UNUSUAL BRUISING OR BLEEDING *URINARY PROBLEMS (pain or burning when urinating, or frequent urination) *BOWEL PROBLEMS (unusual diarrhea, constipation, pain near the anus) TENDERNESS IN MOUTH AND THROAT WITH OR WITHOUT PRESENCE OF ULCERS (sore throat, sores in mouth, or a toothache) UNUSUAL RASH, SWELLING OR PAIN  UNUSUAL VAGINAL DISCHARGE OR ITCHING   Items with * indicate a potential emergency and should be followed up as soon as possible or go to the Emergency Department if any problems should occur.  Please show the CHEMOTHERAPY ALERT CARD or IMMUNOTHERAPY ALERT CARD at  check-in to the Emergency Department and triage nurse.  Should you have questions after your visit or need to cancel or reschedule your appointment, please contact [facility]  Dept: [phone]  and follow the prompts.  Office hours are 8:00 a.m. to 4:30 p.m. Monday - Friday. Please note that voicemails left after 4:00 p.m. may not be returned until the following business day.  We are closed weekends and major holidays. You have access to a nurse at all times for urgent questions. Please call the main number to the clinic Dept: [phone] and follow the prompts.   For any non-urgent questions, you may also contact your provider using MyChart. We now offer e-Visits for anyone 18 and older to request care online for non-urgent symptoms. For details visit [email or web address].   Also download the MyChart app! Go to the app store, search "MyChart", open the app, select [facility], and log in with your MyChart username and password.   

## 2023-03-21 NOTE — Progress Notes (Signed)
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  PRIOR THERAPY: Status post left laparoscopic radical nephrectomy on 03/12/2022 under the care of Dr. 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  CURRENT THERAPY: Adjuvant immunotherapy with Keytruda 200 Mg IV every 3 weeks status post 13 cycles.  INTERVAL HISTORY: Russell Burns 57 y.o. male returns to the clinic today for follow-up visit.  The patient is feeling fine today with no concerning complaints.  His shortness of breath has significantly improved and I think it was triggered by chemicals in his workplace.  He denied having any current chest pain, shortness of breath, cough or hemoptysis.  He has no nausea, vomiting, diarrhea or constipation.  He has no headache or visual changes.  He has no fever or chills.  He is here today for evaluation before starting cycle #14.  MEDICAL HISTORY: Past Medical History:  Diagnosis Date   Allergy    Aortic stenosis    Arthritis    Blood transfusion without reported diagnosis    Cancer (HCC)    Chronic kidney disease    Diabetes mellitus without complication (HCC)    FH: thoracic aneurysm    Hyperlipidemia    Hypertension    Obesity     ALLERGIES:  is allergic to bee venom and other.  MEDICATIONS:  Current Outpatient Medications  Medication Sig Dispense Refill   acetaminophen (TYLENOL) 500 MG tablet Take 1,000 mg by mouth in the morning.     albuterol (PROVENTIL) (2.5 MG/3ML) 0.083% nebulizer solution Take 3 mLs (2.5 mg total) by nebulization every 6 (six) hours as needed for wheezing or shortness of breath. 75 mL 12   albuterol  (VENTOLIN HFA) 108 (90 Base) MCG/ACT inhaler Inhale 2 puffs into the lungs every 6 (six) hours as needed for wheezing or shortness of breath.     amLODipine (NORVASC) 10 MG tablet Take 10 mg by mouth daily.     atorvastatin (LIPITOR) 80 MG tablet Take 80 mg by mouth daily.     Budeson-Glycopyrrol-Formoterol (BREZTRI AEROSPHERE) 160-9-4.8 MCG/ACT AERO Inhale 2 puffs into the lungs 2 (two) times daily. Take 2 puffs first thing in am and then another 2 puffs about 12 hours later. 10.7 g 11   cetirizine (ZYRTEC) 10 MG tablet Take 10 mg by mouth 2 (two) times daily.     EPINEPHrine 0.3 mg/0.3 mL IJ SOAJ injection Inject 0.3 mg into the muscle as needed for anaphylaxis.     ezetimibe (ZETIA) 10 MG tablet Take 10 mg by mouth daily.     famotidine (PEPCID) 20 MG tablet One after supper (Patient taking differently: Take 20 mg by mouth daily after supper.) 30 tablet 11   fluticasone (FLONASE) 50 MCG/ACT nasal spray Place 2 sprays into both nostrils in the morning and at bedtime.     ipratropium-albuterol (DUONEB) 0.5-2.5 (3) MG/3ML SOLN Take 3 mLs by nebulization every 6 (six) hours as needed (sob/wheezing).     JARDIANCE 25 MG TABS tablet Take 25 mg by mouth daily.     levothyroxine (SYNTHROID) 25 MCG tablet Take 1 tablet (25 mcg total) by mouth daily before breakfast. 30 tablet 2  losartan (COZAAR) 100 MG tablet Take 100 mg by mouth daily.     metFORMIN (GLUCOPHAGE) 1000 MG tablet Take 1,000 mg by mouth 2 (two) times daily with a meal.     montelukast (SINGULAIR) 10 MG tablet Take 1 tablet (10 mg total) by mouth at bedtime. 30 tablet 0   ONETOUCH VERIO test strip 1 each by Other route 5 (five) times daily.     pantoprazole (PROTONIX) 40 MG tablet Take 1 tablet (40 mg total) by mouth 2 (two) times daily. 60 tablet 0   prochlorperazine (COMPAZINE) 10 MG tablet Take 1 tablet (10 mg total) by mouth every 6 (six) hours as needed. (Patient taking differently: Take 10 mg by mouth every 6 (six) hours as needed for  nausea or vomiting.) 30 tablet 0   REPATHA 140 MG/ML SOSY Inject 140 mg into the skin every 14 (fourteen) days.     tamsulosin (FLOMAX) 0.4 MG CAPS capsule Take 1 capsule (0.4 mg total) by mouth daily. 14 capsule 0   tirzepatide (MOUNJARO) 10 MG/0.5ML Pen Inject 10 mg into the skin once a week. (Patient taking differently: Inject 10 mg into the skin every Friday.) 6 mL 3   No current facility-administered medications for this visit.    SURGICAL HISTORY:  Past Surgical History:  Procedure Laterality Date   CYSTOSCOPY WITH URETEROSCOPY AND STENT PLACEMENT Left 02/12/2022   Procedure: CYSTOSCOPY WITH LEFT RETROGRADE PYELOGRAM, DIAGNOSTIC URETEROSCOPY AND STENT PLACEMENT;  Surgeon: Crista Elliot, MD;  Location: [facility];  Service: Urology;  Laterality: Left;   LAPAROSCOPIC NEPHRECTOMY, HAND ASSISTED Left 03/12/2022   Procedure: LEFT HAND ASSISTED LAPAROSCOPIC NEPHRECTOMY;  Surgeon: Crista Elliot, MD;  Location: [facility];  Service: Urology;  Laterality: Left;  NEED 2.5 HRS FOR THIS CASE   TONSILLECTOMY     WISDOM TOOTH EXTRACTION      REVIEW OF SYSTEMS:  A comprehensive review of systems was negative.   PHYSICAL EXAMINATION: General appearance: alert, cooperative, and no distress Head: Normocephalic, without obvious abnormality, atraumatic Neck: no adenopathy, no JVD, supple, symmetrical, trachea midline, and thyroid not enlarged, symmetric, no tenderness/mass/nodules Lymph nodes: Cervical, supraclavicular, and axillary nodes normal. Resp: clear to auscultation bilaterally Back: symmetric, no curvature. ROM normal. No CVA tenderness. Cardio: regular rate and rhythm, S1, S2 normal, no murmur, click, rub or gallop GI: soft, non-tender; bowel sounds normal; no masses,  no organomegaly Extremities: extremities normal, atraumatic, no cyanosis or edema  ECOG PERFORMANCE STATUS: 0 - Asymptomatic  Blood pressure (!) 140/94, pulse 80, temperature 97.9 F (36.6 C), temperature source Temporal,  resp. rate 16, height 5\' 10"  (1.778 m), weight 244 lb 8 oz (110.9 kg), SpO2 98 %.  LABORATORY DATA: Lab Results  Component Value Date   WBC 4.4 03/21/2023   HGB 14.9 03/21/2023   HCT 43.4 03/21/2023   MCV 94.8 03/21/2023   PLT 223 03/21/2023      Chemistry      Component Value Date/Time   NA 133 (L) 02/28/2023 1056   NA 139 07/25/2022 0935   K 4.8 02/28/2023 1056   CL 101 02/28/2023 1056   CO2 23 02/28/2023 1056   BUN 34 (H) 02/28/2023 1056   BUN 23 07/25/2022 0935   CREATININE 1.21 02/28/2023 1056      Component Value Date/Time   CALCIUM 9.4 02/28/2023 1056   ALKPHOS 50 02/28/2023 1056   AST 11 (L) 02/28/2023 1056   ALT 13 02/28/2023 1056   BILITOT 0.6 02/28/2023 1056  RADIOGRAPHIC STUDIES: ECHOCARDIOGRAM COMPLETE  Result Date: 02/20/2023    ECHOCARDIOGRAM REPORT   Patient Name:   Russell Burns Date of Exam: 02/20/2023 Medical Rec #:  [id number]         Height:       70.0 in Accession #:    [id number]        Weight:       240.4 lb Date of Birth:  [birth year]         BSA:          2.257 m Patient Age:    57 years          BP:           124/83 mmHg Patient Gender: M                 HR:           83 bpm. Exam Location:  Inpatient Procedure: 2D Echo, Cardiac Doppler, Color Doppler and Intracardiac            Opacification Agent Indications:    Aortic Stenosis  History:        Patient has prior history of Echocardiogram examinations, most                 recent 09/29/2021. CHF; Risk Factors:Hypertension and Diabetes.  Sonographer:    Lucy Antigua Referring Phys: [id number] John Giovanni  Sonographer Comments: Technically difficult study due to poor echo windows. IMPRESSIONS  1. Left ventricular ejection fraction, by estimation, is 60 to 65%. The left ventricle has normal function. Left ventricular endocardial border not optimally defined to evaluate regional wall motion. Left ventricular diastolic parameters are consistent with Grade I diastolic dysfunction (impaired  relaxation).  2. Right ventricular systolic function is normal. The right ventricular size is normal. Tricuspid regurgitation signal is inadequate for assessing PA pressure.  3. The mitral valve is grossly normal. No evidence of mitral valve regurgitation. No evidence of mitral stenosis.  4. DVI 0.29; Suspect Siever's 1 Bicuspid with R-L calcified raphe. The aortic valve was not well visualized. Aortic valve regurgitation is not visualized. Moderate to severe aortic valve stenosis. Aortic valve mean gradient measures 37.0 mmHg. Aortic valve Vmax measures 3.65 m/s. Comparison(s): Prior images reviewed side by side. Futher increase in aortic valve gradients. Patient ascending aortic aneurysm, seen best on prior CT, is not well visualized in this study. FINDINGS  Left Ventricle: Left ventricular ejection fraction, by estimation, is 60 to 65%. The left ventricle has normal function. Left ventricular endocardial border not optimally defined to evaluate regional wall motion. Definity contrast agent was given IV to delineate the left ventricular endocardial borders. The left ventricular internal cavity size was normal in size. Suboptimal image quality limits for assessment of left ventricular hypertrophy. Left ventricular diastolic parameters are consistent with Grade I diastolic dysfunction (impaired relaxation). Right Ventricle: The right ventricular size is normal. No increase in right ventricular wall thickness. Right ventricular systolic function is normal. Tricuspid regurgitation signal is inadequate for assessing PA pressure. Left Atrium: Left atrial size was normal in size. Right Atrium: Right atrial size was normal in size. Prominent Eustachian valve. Pericardium: There is no evidence of pericardial effusion. Mitral Valve: The mitral valve is grossly normal. No evidence of mitral valve regurgitation. No evidence of mitral valve stenosis. Tricuspid Valve: The tricuspid valve is grossly normal. Tricuspid valve  regurgitation is not demonstrated. No evidence of tricuspid stenosis. Aortic Valve: DVI 0.29; Suspect Siever's 1 Bicuspid with R-L calcified raphe.  The aortic valve was not well visualized. Aortic valve regurgitation is not visualized. Moderate to severe aortic stenosis is present. Aortic valve mean gradient measures 37.0 mmHg. Aortic valve peak gradient measures 53.3 mmHg. Aortic valve area, by VTI measures 1.31 cm. Pulmonic Valve: The pulmonic valve was not well visualized. Pulmonic valve regurgitation is not visualized. Aorta: The aortic root is normal in size and structure and the ascending aorta was not well visualized. IAS/Shunts: The interatrial septum was not well visualized.  LEFT VENTRICLE PLAX 2D LVOT diam:     2.40 cm   Diastology LV SV:         96        LV e' medial:    6.85 cm/s LV SV Index:   42        LV E/e' medial:  7.9 LVOT Area:     4.52 cm  LV e' lateral:   8.49 cm/s                          LV E/e' lateral: 6.4  RIGHT VENTRICLE RV S prime:     13.30 cm/s TAPSE (M-mode): 1.9 cm LEFT ATRIUM             Index        RIGHT ATRIUM           Index LA Vol (A2C):   47.2 ml 20.91 ml/m  RA Area:     14.50 cm LA Vol (A4C):   48.5 ml 21.49 ml/m  RA Volume:   34.10 ml  15.11 ml/m LA Biplane Vol: 50.3 ml 22.29 ml/m  AORTIC VALVE AV Area (Vmax):    1.21 cm AV Area (Vmean):   1.00 cm AV Area (VTI):     1.31 cm AV Vmax:           365.00 cm/s AV Vmean:          279.000 cm/s AV VTI:            0.734 m AV Peak Grad:      53.3 mmHg AV Mean Grad:      37.0 mmHg LVOT Vmax:         97.30 cm/s LVOT Vmean:        61.700 cm/s LVOT VTI:          0.212 m LVOT/AV VTI ratio: 0.29  AORTA Ao Root diam: 3.30 cm MITRAL VALVE MV Area (PHT): 4.21 cm    SHUNTS MV Decel Time: 180 msec    Systemic VTI:  0.21 m MV E velocity: 54.40 cm/s  Systemic Diam: 2.40 cm MV A velocity: 81.40 cm/s MV E/A ratio:  0.67 Riley Lam MD Electronically signed by Riley Lam MD Signature Date/Time: 02/20/2023/2:21:07 PM     Final    DG ESOPHAGUS W SINGLE CM (SOL OR THIN BA)  Result Date: 02/20/2023 CLINICAL DATA:  Provided history: Dysphagia. Additional history: The patient reports dysphagia with solid foods, emesis after eating. EXAM: ESOPHAGUS/BARIUM SWALLOW/TABLET STUDY TECHNIQUE: A combined double and single contrast examination was performed using effervescent crystals, high-density barium, and thin liquid barium. The patient also swallowed a 13 mm barium tablet under fluoroscopy. The exam was performed by Alex Gardener, NP, and was supervised and interpreted by Dr. Jackey Loge. FLUOROSCOPY: Fluoroscopy time: 1 minute 54 seconds (23.50 mGy). COMPARISON:  Chest CT 02/19/2023. FINDINGS: Unremarkable appearance of the esophagus without evidence of fixed stricture, mass or mucosal abnormality. Moderate intermittent esophageal dysmotility with  tertiary contractions. No appreciable hiatal hernia. No gastroesophageal reflux was observed. The patient swallowed a 13 mm barium tablet, which freely passed into the stomach. IMPRESSION: 1. Moderate esophageal dysmotility with tertiary contractions. 2. Otherwise unremarkable esophagram, as described. Electronically Signed   By: Jackey Loge D.O.   On: 02/20/2023 13:19   CT Angio Chest PE W and/or Wo Contrast  Result Date: 02/19/2023 CLINICAL DATA:  Pulmonary embolism (PE) suspected, high prob. Shortness of breath EXAM: CT ANGIOGRAPHY CHEST WITH CONTRAST TECHNIQUE: Multidetector CT imaging of the chest was performed using the standard protocol during bolus administration of intravenous contrast. Multiplanar CT image reconstructions and MIPs were obtained to evaluate the vascular anatomy. RADIATION DOSE REDUCTION: This exam was performed according to the departmental dose-optimization program which includes automated exposure control, adjustment of the mA and/or kV according to patient size and/or use of iterative reconstruction technique. CONTRAST:  75mL OMNIPAQUE IOHEXOL 350 MG/ML SOLN  COMPARISON:  11/26/2022 FINDINGS: Cardiovascular: No filling defects in the pulmonary arteries to suggest pulmonary emboli. Heart is normal size. Aorta is normal caliber. Extensive coronary artery and moderate aortic calcifications. Aneurysmal dilatation of the ascending thoracic aorta, 4.5 cm, stable. Mediastinum/Nodes: Stable mildly prominent right hilar lymph nodes. Scattered small mediastinal and left hilar lymph nodes, none pathologically enlarged. Trachea and esophagus are unremarkable. Lungs/Pleura: Calcified granuloma in the left lower lobe. No confluent opacities or effusions. Upper Abdomen: No acute findings Musculoskeletal: Chest wall soft tissues are unremarkable. No acute bony abnormality. Review of the MIP images confirms the above findings. IMPRESSION: No evidence of pulmonary embolus. Coronary artery disease. No acute cardiopulmonary disease. 4.5 cm ascending thoracic aortic aneurysm. Recommend semi-annual imaging followup by CTA or MRA and referral to cardiothoracic surgery if not already obtained. This recommendation follows 2010 ACCF/AHA/AATS/ACR/ASA/SCA/SCAI/SIR/STS/SVM Guidelines for the Diagnosis and Management of Patients With Thoracic Aortic Disease. Circulation. 2010; 121: Z610-R604. Aortic aneurysm NOS (ICD10-I71.9) Aortic Atherosclerosis (ICD10-I70.0). Electronically Signed   By: Charlett Nose M.D.   On: 02/19/2023 21:39   DG Chest 2 View  Result Date: 02/19/2023 CLINICAL DATA:  Shortness of breath EXAM: CHEST - 2 VIEW COMPARISON:  11/26/2022 FINDINGS: The heart size and mediastinal contours are within normal limits. Both lungs are clear. The visualized skeletal structures are unremarkable except for degenerative changes throughout the spine. Trachea midline. IMPRESSION: No active cardiopulmonary disease. Electronically Signed   By: Judie Petit.  Shick M.D.   On: 02/19/2023 18:47    ASSESSMENT AND PLAN: This is a very pleasant 57 years old white male with Stage Burns (T3a, N0, M0) clear-cell  renal cell carcinoma without evidence of metastatic disease diagnosed in June 2023.  He is status post left laparoscopic radical nephrectomy on 03/12/2022 under the care of Dr. 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 The patient is currently undergoing treatment with Adjuvant immunotherapy with Keytruda 200 Mg IV every 3 weeks status post 13 cycles. The patient has been tolerating this treatment well with no concerning adverse effects. I recommended for him to proceed with cycle #14 today as planned. For the hypothyroidism, he will continue his current treatment with levothyroxine. I will see him back for follow-up visit in 3 weeks for evaluation before starting cycle #15. He was advised to call immediately if he has any other concerning symptoms in the interval. The patient voices understanding of current disease status and treatment options and is in agreement with the current  care plan.  All questions were answered. The patient knows to call the clinic with any problems, questions or concerns. We can certainly see the patient much sooner if necessary.  The total time spent in the appointment was 20 minutes.  Disclaimer: This note was dictated with voice recognition software. Similar sounding words can inadvertently be transcribed and may not be corrected upon review.

## 2023-03-28 DIAGNOSIS — I10 Essential (primary) hypertension: Secondary | ICD-10-CM | POA: Diagnosis not present

## 2023-03-28 DIAGNOSIS — G4733 Obstructive sleep apnea (adult) (pediatric): Secondary | ICD-10-CM | POA: Diagnosis not present

## 2023-04-04 ENCOUNTER — Telehealth: Payer: Self-pay

## 2023-04-04 NOTE — Telephone Encounter (Signed)
Notified Patient of completion of FMLA forms. Fax transmission confirmation received. Copy of forms placed for pick-up as requested by Patient. No other needs or concerns voiced at this time. 

## 2023-04-04 NOTE — Telephone Encounter (Signed)
Entered in error. Duplicate.

## 2023-04-09 NOTE — Progress Notes (Deleted)
[facility] OFFICE PROGRESS NOTE  [name], MD [address] 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   PRIOR THERAPY: Status post left laparoscopic radical nephrectomy on 03/12/2022 under the care of Dr. 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   CURRENT THERAPY: Adjuvant immunotherapy with Keytruda 200 Mg IV every 3 weeks status post 14 cycles.  Plan is to undergo 1 year of treatment 17 cycles   INTERVAL HISTORY: Russell Burns 57 y.o. male returns to the clinic today for a follow-up visit. The patient is currently undergoing adjuvant immunotherapy with Keytruda IV every 3 weeks.  He is status post 13 cycles and has been tolerating it well. The patient denies any recent fever, chills, night sweats, or unexplained weight loss. Denies any nausea, vomiting, diarrhea, or constipation. Denies any chest pain, shortness of breath, cough, or hemoptysis. The patient periodically goes to the ER for shortness of breath and oxygen saturation in the 80's. Work up showed chemical pneumonitis vs aspiration events. He saw Dr. Tonia Brooms yesterday. He follows closely with pulmonary medicine. Denies any rashes or itching. Denies any new bone pain. Denies any headache or visual changes. Denies any abdominal pain or back pain. Denies any urinary changes. He is here today for evaluation repeat blood work before undergoing cycle #14.   Completed FMLA.   MEDICAL HISTORY: Past Medical History:  Diagnosis Date   Allergy    Aortic stenosis    Arthritis    Blood transfusion without reported diagnosis    Cancer (HCC)    Chronic kidney disease    Diabetes mellitus without complication (HCC)    FH: thoracic aneurysm    Hyperlipidemia    Hypertension    Obesity      ALLERGIES:  is allergic to bee venom and other.  MEDICATIONS:  Current Outpatient Medications  Medication Sig Dispense Refill   acetaminophen (TYLENOL) 500 MG tablet Take 1,000 mg by mouth in the morning.     albuterol (PROVENTIL) (2.5 MG/3ML) 0.083% nebulizer solution Take 3 mLs (2.5 mg total) by nebulization every 6 (six) hours as needed for wheezing or shortness of breath. 75 mL 12   albuterol (VENTOLIN HFA) 108 (90 Base) MCG/ACT inhaler Inhale 2 puffs into the lungs every 6 (six) hours as needed for wheezing or shortness of breath.     amLODipine (NORVASC) 10 MG tablet Take 10 mg by mouth daily.     atorvastatin (LIPITOR) 80 MG tablet Take 80 mg by mouth daily.     Budeson-Glycopyrrol-Formoterol (BREZTRI AEROSPHERE) 160-9-4.8 MCG/ACT AERO Inhale 2 puffs into the lungs 2 (two) times daily. Take 2 puffs first thing in am and then another 2 puffs about 12 hours later. 10.7 g 11   cetirizine (ZYRTEC) 10 MG tablet Take 10 mg by mouth 2 (two) times daily.     EPINEPHrine 0.3 mg/0.3 mL IJ SOAJ injection Inject 0.3 mg into the muscle as needed for anaphylaxis.     ezetimibe (ZETIA) 10 MG tablet Take 10 mg by mouth daily.     famotidine (PEPCID) 20 MG tablet One after supper (Patient taking differently: Take 20 mg by mouth daily after supper.) 30 tablet 11   fluticasone (FLONASE) 50 MCG/ACT nasal spray Place 2 sprays into both nostrils in the morning and at bedtime.  ipratropium-albuterol (DUONEB) 0.5-2.5 (3) MG/3ML SOLN Take 3 mLs by nebulization every 6 (six) hours as needed (sob/wheezing).     JARDIANCE 25 MG TABS tablet Take 25 mg by mouth daily.     levothyroxine (SYNTHROID) 25 MCG tablet Take 1 tablet (25 mcg total) by mouth daily before breakfast. 30 tablet 2   losartan (COZAAR) 100 MG tablet Take 100 mg by mouth daily.     metFORMIN (GLUCOPHAGE) 1000 MG tablet Take 1,000 mg by mouth 2 (two) times daily with a meal.     montelukast (SINGULAIR) 10 MG tablet Take 1 tablet (10 mg total)  by mouth at bedtime. 30 tablet 0   ONETOUCH VERIO test strip 1 each by Other route 5 (five) times daily.     pantoprazole (PROTONIX) 40 MG tablet Take 1 tablet (40 mg total) by mouth 2 (two) times daily. 60 tablet 0   prochlorperazine (COMPAZINE) 10 MG tablet Take 1 tablet (10 mg total) by mouth every 6 (six) hours as needed. (Patient taking differently: Take 10 mg by mouth every 6 (six) hours as needed for nausea or vomiting.) 30 tablet 0   REPATHA 140 MG/ML SOSY Inject 140 mg into the skin every 14 (fourteen) days.     tamsulosin (FLOMAX) 0.4 MG CAPS capsule Take 1 capsule (0.4 mg total) by mouth daily. 14 capsule 0   tirzepatide (MOUNJARO) 10 MG/0.5ML Pen Inject 10 mg into the skin once a week. (Patient taking differently: Inject 10 mg into the skin every Friday.) 6 mL 3   No current facility-administered medications for this visit.    SURGICAL HISTORY:  Past Surgical History:  Procedure Laterality Date   CYSTOSCOPY WITH URETEROSCOPY AND STENT PLACEMENT Left 02/12/2022   Procedure: CYSTOSCOPY WITH LEFT RETROGRADE PYELOGRAM, DIAGNOSTIC URETEROSCOPY AND STENT PLACEMENT;  Surgeon: Crista Elliot, MD;  Location: [facility];  Service: Urology;  Laterality: Left;   LAPAROSCOPIC NEPHRECTOMY, HAND ASSISTED Left 03/12/2022   Procedure: LEFT HAND ASSISTED LAPAROSCOPIC NEPHRECTOMY;  Surgeon: Crista Elliot, MD;  Location: [facility];  Service: Urology;  Laterality: Left;  NEED 2.5 HRS FOR THIS CASE   TONSILLECTOMY     WISDOM TOOTH EXTRACTION      REVIEW OF SYSTEMS:   Review of Systems  Constitutional: Negative for appetite change, chills, fatigue, fever and unexpected weight change.  HENT:   Negative for mouth sores, nosebleeds, sore throat and trouble swallowing.   Eyes: Negative for eye problems and icterus.  Respiratory: Negative for cough, hemoptysis, shortness of breath and wheezing.   Cardiovascular: Negative for chest pain and leg swelling.  Gastrointestinal: Negative for abdominal pain,  constipation, diarrhea, nausea and vomiting.  Genitourinary: Negative for bladder incontinence, difficulty urinating, dysuria, frequency and hematuria.   Musculoskeletal: Negative for back pain, gait problem, neck pain and neck stiffness.  Skin: Negative for itching and rash.  Neurological: Negative for dizziness, extremity weakness, gait problem, headaches, light-headedness and seizures.  Hematological: Negative for adenopathy. Does not bruise/bleed easily.  Psychiatric/Behavioral: Negative for confusion, depression and sleep disturbance. The patient is not nervous/anxious.     PHYSICAL EXAMINATION:  There were no vitals taken for this visit.  ECOG PERFORMANCE STATUS: {CHL ONC ECOG [id number]  Physical Exam  Constitutional: Oriented to person, place, and time and well-developed, well-nourished, and in no distress. No distress.  HENT:  Head: Normocephalic and atraumatic.  Mouth/Throat: Oropharynx is clear and moist. No oropharyngeal exudate.  Eyes: Conjunctivae are normal. Right eye exhibits no discharge. Left eye exhibits no discharge.  No scleral icterus.  Neck: Normal range of motion. Neck supple.  Cardiovascular: Normal rate, regular rhythm, normal heart sounds and intact distal pulses.   Pulmonary/Chest: Effort normal and breath sounds normal. No respiratory distress. No wheezes. No rales.  Abdominal: Soft. Bowel sounds are normal. Exhibits no distension and no mass. There is no tenderness.  Musculoskeletal: Normal range of motion. Exhibits no edema.  Lymphadenopathy:    No cervical adenopathy.  Neurological: Alert and oriented to person, place, and time. Exhibits normal muscle tone. Gait normal. Coordination normal.  Skin: Skin is warm and dry. No rash noted. Not diaphoretic. No erythema. No pallor.  Psychiatric: Mood, memory and judgment normal.  Vitals reviewed.  LABORATORY DATA: Lab Results  Component Value Date   WBC 4.4 03/21/2023   HGB 14.9 03/21/2023   HCT 43.4  03/21/2023   MCV 94.8 03/21/2023   PLT 223 03/21/2023      Chemistry      Component Value Date/Time   NA 139 03/21/2023 0912   NA 139 07/25/2022 0935   K 4.6 03/21/2023 0912   CL 105 03/21/2023 0912   CO2 24 03/21/2023 0912   BUN 18 03/21/2023 0912   BUN 23 07/25/2022 0935   CREATININE 1.32 (H) 03/21/2023 0912      Component Value Date/Time   CALCIUM 9.0 03/21/2023 0912   ALKPHOS 61 03/21/2023 0912   AST 16 03/21/2023 0912   ALT 21 03/21/2023 0912   BILITOT 0.3 03/21/2023 0912       RADIOGRAPHIC STUDIES:  No results found.   ASSESSMENT/PLAN:  This is a very pleasant 57 year old Caucasian male diagnosed with stage IIIa (T3a, N0, M0) clear-cell renal cell carcinoma.  He does not have any evidence of metastatic disease.  He was diagnosed in June 2023.   He is status post left laparoscopic radical nephrectomy on 03/12/2022 under the care of Dr. Alvester Morin.  The final pathology showed clear cell renal cell carcinoma nuclear grade 4 measuring 7.0 cm with tumor extension into the renal vein and renal sinus.   The patient is currently undergoing adjuvant immunotherapy with Keytruda 200 mg IV every 3 weeks. He is status post 13 cycles. The plan is to undergo 1 year of treatment which is 17 cycles.   Labs were reviewed.  Recommend that he proceed with cycle #14 today as schedule.   We will see him back for follow-up visit in 3 weeks for evaluation and repeat blood work before undergoing cycle #15.  He will continue to follow with pulmonary medicine   The patient was advised to call immediately if she has any concerning symptoms in the interval. The patient voices understanding of current disease status and treatment options and is in agreement with the current care plan. All questions were answered. The patient knows to call the clinic with any problems, questions or concerns. We can certainly see the patient much sooner if necessary  No orders of the defined types were placed in this  encounter.    I spent {CHL ONC TIME VISIT - [id number]} counseling the patient face to face. The total time spent in the appointment was {CHL ONC TIME VISIT - JOACZ:[id number]}.   L , PA-C 04/09/23

## 2023-04-10 ENCOUNTER — Encounter: Payer: Self-pay | Admitting: Pulmonary Disease

## 2023-04-10 ENCOUNTER — Ambulatory Visit (INDEPENDENT_AMBULATORY_CARE_PROVIDER_SITE_OTHER): Payer: BC Managed Care – PPO | Admitting: Pulmonary Disease

## 2023-04-10 VITALS — BP 110/70 | HR 86 | Ht 70.0 in | Wt 242.4 lb

## 2023-04-10 DIAGNOSIS — J4530 Mild persistent asthma, uncomplicated: Secondary | ICD-10-CM | POA: Diagnosis not present

## 2023-04-10 NOTE — Addendum Note (Signed)
Addended by: Hedda Slade on: 04/10/2023 02:26 PM   Modules accepted: Orders

## 2023-04-10 NOTE — Progress Notes (Signed)
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  Currently on adjuvant immunotherapy with Keytruda.  Follows with Dr. Shirline Frees.  Patient was in the hospital back in May 2024.  Was seen in the office before with a history of asthma.  Used to see Dr. Sherene Sires.  His recurrent exacerbations continued to occur prior to him starting Keytruda.  He also has increased reflux symptoms.  He had elevated eosinophils on his admission.  He has an esophagram with some dysmotility.  Was started on Singulair.  And was referred for consideration of starting a biologic. Toluene-2,4- diisocyanate exposure at work that he believes triggers his flare ups.     Past Medical History:  Diagnosis Date   Allergy    Aortic stenosis    Arthritis    Blood transfusion without reported diagnosis    Cancer (HCC)    Chronic kidney disease    Diabetes mellitus without complication (HCC)    FH: thoracic aneurysm    Hyperlipidemia    Hypertension    Obesity      Family History  Problem Relation Age of Onset   CAD Other    Diabetes Mellitus II Other    Hypertension Other    Heart failure Other    Hypertension Mother    Heart attack Father        48, 50 at both ages   Diabetes Mellitus II Father        insulin dependent   Aneurysm Maternal Uncle        aortic   Heart attack Paternal Uncle 61       minor   Hypertension Paternal Uncle    Heart attack Maternal Grandmother 50       massive   Aneurysm Maternal Grandfather        abdominal   Heart attack Paternal  Grandfather 37   Aneurysm Cousin 44       aortic   Colon polyps Neg Hx    Esophageal cancer Neg Hx    Rectal cancer Neg Hx    Stomach cancer Neg Hx      Past Surgical History:  Procedure Laterality Date   CYSTOSCOPY WITH URETEROSCOPY AND STENT PLACEMENT Left 02/12/2022   Procedure: CYSTOSCOPY WITH LEFT RETROGRADE PYELOGRAM, DIAGNOSTIC URETEROSCOPY AND STENT PLACEMENT;  Surgeon: Crista Elliot, MD;  Location: [facility];  Service: Urology;  Laterality: Left;   LAPAROSCOPIC NEPHRECTOMY, HAND ASSISTED Left 03/12/2022   Procedure: LEFT HAND ASSISTED LAPAROSCOPIC NEPHRECTOMY;  Surgeon: Crista Elliot, MD;  Location: [facility];  Service: Urology;  Laterality: Left;  NEED 2.5 HRS FOR THIS CASE   TONSILLECTOMY     WISDOM TOOTH EXTRACTION      Social History   Socioeconomic History   Marital status: Married    Spouse name: Not on file   Number of children: 2   Years of education: Not on file   Highest education level: Not on file  Occupational History   Not on file  Tobacco Use   Smoking status: Never   Smokeless  tobacco: Current    Types: Chew  Vaping Use   Vaping status: Never Used  Substance and Sexual Activity   Alcohol use: Yes    Comment: 3 drinks a week.    Drug use: No   Sexual activity: Yes  Other Topics Concern   Not on file  Social History Narrative   Not on file   Social Determinants of Health   Financial Resource Strain: Not on file  Food Insecurity: No Food Insecurity (11/26/2022)   Hunger Vital Sign    Worried About Running Out of Food in the Last Year: Never true    Ran Out of Food in the Last Year: Never true  Transportation Needs: No Transportation Needs (11/26/2022)   PRAPARE - Administrator, Civil Service (Medical): No    Lack of Transportation (Non-Medical): No  Physical Activity: Not on file  Stress: Not on file  Social Connections: Unknown (10/24/2022)   Received from [facility]   Social Network    Social Network: Not on file   Intimate Partner Violence: Not At Risk (11/26/2022)   Humiliation, Afraid, Rape, and Kick questionnaire    Fear of Current or Ex-Partner: No    Emotionally Abused: No    Physically Abused: No    Sexually Abused: No     Allergies  Allergen Reactions   Bee Venom Anaphylaxis   Other Itching, Swelling and Other (See Comments)    Feline dander- Runny nose, itchy/puffy eyes     Outpatient Medications Prior to Visit  Medication Sig Dispense Refill   acetaminophen (TYLENOL) 500 MG tablet Take 1,000 mg by mouth in the morning.     albuterol (PROVENTIL) (2.5 MG/3ML) 0.083% nebulizer solution Take 3 mLs (2.5 mg total) by nebulization every 6 (six) hours as needed for wheezing or shortness of breath. 75 mL 12   albuterol (VENTOLIN HFA) 108 (90 Base) MCG/ACT inhaler Inhale 2 puffs into the lungs every 6 (six) hours as needed for wheezing or shortness of breath.     amLODipine (NORVASC) 10 MG tablet Take 10 mg by mouth daily.     atorvastatin (LIPITOR) 80 MG tablet Take 80 mg by mouth daily.     Budeson-Glycopyrrol-Formoterol (BREZTRI AEROSPHERE) 160-9-4.8 MCG/ACT AERO Inhale 2 puffs into the lungs 2 (two) times daily. Take 2 puffs first thing in am and then another 2 puffs about 12 hours later. 10.7 g 11   cetirizine (ZYRTEC) 10 MG tablet Take 10 mg by mouth 2 (two) times daily.     EPINEPHrine 0.3 mg/0.3 mL IJ SOAJ injection Inject 0.3 mg into the muscle as needed for anaphylaxis.     ezetimibe (ZETIA) 10 MG tablet Take 10 mg by mouth daily.     famotidine (PEPCID) 20 MG tablet One after supper (Patient taking differently: Take 20 mg by mouth daily after supper.) 30 tablet 11   fluticasone (FLONASE) 50 MCG/ACT nasal spray Place 2 sprays into both nostrils in the morning and at bedtime.     ipratropium-albuterol (DUONEB) 0.5-2.5 (3) MG/3ML SOLN Take 3 mLs by nebulization every 6 (six) hours as needed (sob/wheezing).     JARDIANCE 25 MG TABS tablet Take 25 mg by mouth daily.     levothyroxine  (SYNTHROID) 25 MCG tablet Take 1 tablet (25 mcg total) by mouth daily before breakfast. 30 tablet 2   losartan (COZAAR) 100 MG tablet Take 100 mg by mouth daily.     metFORMIN (GLUCOPHAGE) 1000 MG tablet Take 1,000 mg by mouth  2 (two) times daily with a meal.     montelukast (SINGULAIR) 10 MG tablet Take 1 tablet (10 mg total) by mouth at bedtime. 30 tablet 0   ONETOUCH VERIO test strip 1 each by Other route 5 (five) times daily.     pantoprazole (PROTONIX) 40 MG tablet Take 1 tablet (40 mg total) by mouth 2 (two) times daily. 60 tablet 0   prochlorperazine (COMPAZINE) 10 MG tablet Take 1 tablet (10 mg total) by mouth every 6 (six) hours as needed. (Patient taking differently: Take 10 mg by mouth every 6 (six) hours as needed for nausea or vomiting.) 30 tablet 0   REPATHA 140 MG/ML SOSY Inject 140 mg into the skin every 14 (fourteen) days.     tamsulosin (FLOMAX) 0.4 MG CAPS capsule Take 1 capsule (0.4 mg total) by mouth daily. 14 capsule 0   tirzepatide (MOUNJARO) 10 MG/0.5ML Pen Inject 10 mg into the skin once a week. (Patient taking differently: Inject 10 mg into the skin every Friday.) 6 mL 3   No facility-administered medications prior to visit.    Review of Systems  Constitutional:  Negative for chills, fever, malaise/fatigue and weight loss.  HENT:  Negative for hearing loss, sore throat and tinnitus.   Eyes:  Negative for blurred vision and double vision.  Respiratory:  Positive for cough and shortness of breath. Negative for hemoptysis, sputum production, wheezing and stridor.   Cardiovascular:  Negative for chest pain, palpitations, orthopnea, leg swelling and PND.  Gastrointestinal:  Negative for abdominal pain, constipation, diarrhea, heartburn, nausea and vomiting.  Genitourinary:  Negative for dysuria, hematuria and urgency.  Musculoskeletal:  Negative for joint pain and myalgias.  Skin:  Negative for itching and rash.  Neurological:  Negative for dizziness, tingling, weakness  and headaches.  Endo/Heme/Allergies:  Negative for environmental allergies. Does not bruise/bleed easily.  Psychiatric/Behavioral:  Negative for depression. The patient is not nervous/anxious and does not have insomnia.   All other systems reviewed and are negative.    Objective:  Physical Exam Vitals reviewed.  Constitutional:      General: He is not in acute distress.    Appearance: He is well-developed. He is obese.  HENT:     Head: Normocephalic and atraumatic.  Eyes:     General: No scleral icterus.    Conjunctiva/sclera: Conjunctivae normal.     Pupils: Pupils are equal, round, and reactive to light.  Neck:     Vascular: No JVD.     Trachea: No tracheal deviation.  Cardiovascular:     Rate and Rhythm: Normal rate and regular rhythm.     Heart sounds: Normal heart sounds. No murmur heard. Pulmonary:     Effort: Pulmonary effort is normal. No tachypnea, accessory muscle usage or respiratory distress.     Breath sounds: Normal breath sounds. No stridor. No wheezing, rhonchi or rales.  Abdominal:     General: Bowel sounds are normal. There is no distension.     Palpations: Abdomen is soft.     Tenderness: There is no abdominal tenderness.  Musculoskeletal:        General: No tenderness.     Cervical back: Neck supple.  Lymphadenopathy:     Cervical: No cervical adenopathy.  Skin:    General: Skin is warm and dry.     Capillary Refill: Capillary refill takes less than 2 seconds.     Findings: No rash.  Neurological:     Mental Status: He is alert and oriented to  person, place, and time.  Psychiatric:        Behavior: Behavior normal.      Vitals:   04/10/23 1353  BP: 110/70  Pulse: 86  SpO2: 92%  Weight: 242 lb 6.4 oz (110 kg)  Height: 5\' 10"  (1.778 m)   92% on RA BMI Readings from Last 3 Encounters:  04/10/23 34.78 kg/m  03/21/23 35.08 kg/m  02/28/23 35.15 kg/m   Wt Readings from Last 3 Encounters:  04/10/23 242 lb 6.4 oz (110 kg)  03/21/23 244 lb  8 oz (110.9 kg)  02/28/23 245 lb (111.1 kg)     CBC    Component Value Date/Time   WBC 4.4 03/21/2023 0912   WBC 10.8 (H) 02/19/2023 2027   RBC 4.58 03/21/2023 0912   HGB 14.9 03/21/2023 0912   HCT 43.4 03/21/2023 0912   PLT 223 03/21/2023 0912   MCV 94.8 03/21/2023 0912   MCH 32.5 03/21/2023 0912   MCHC 34.3 03/21/2023 0912   RDW 12.8 03/21/2023 0912   LYMPHSABS 1.6 03/21/2023 0912   MONOABS 0.4 03/21/2023 0912   EOSABS 0.4 03/21/2023 0912   BASOSABS 0.1 03/21/2023 0912     Chest Imaging:  CT chest imaging 02/19/2023: No evidence of pulmonary embolism, stable thoracic aneurysm. The patient's images have been independently reviewed by me.    Pulmonary Functions Testing Results:    Latest Ref Rng & Units 12/11/2022    2:49 PM  PFT Results  FVC-Pre L 3.94   FVC-Predicted Pre % 80   FVC-Post L 3.95   FVC-Predicted Post % 80   Pre FEV1/FVC % % 73   Post FEV1/FCV % % 78   FEV1-Pre L 2.88   FEV1-Predicted Pre % 76   FEV1-Post L 3.07   DLCO uncorrected ml/min/mmHg 33.96   DLCO UNC% % 119   DLCO corrected ml/min/mmHg 32.89   DLCO COR %Predicted % 116   DLVA Predicted % 139   TLC L 5.97   TLC % Predicted % 85   RV % Predicted % 106     FeNO:   Pathology:   Echocardiogram:   Heart Catheterization:     Assessment & Plan:     ICD-10-CM   1. Mild persistent asthma without complication  J45.30 Perennial allergen profile IgE    IgE      Discussion:  This is a 57 year old gentleman with persistent asthma symptoms.  I do think he has some occupational flareups related to TDI exposure.  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  But he also has features that are consistent with reactive airway disease including an elevated DLCO on PFTs and a serum elevated eosinophils.  Plan: Will check a IgE level Check serum RAST panel. Continue triple therapy inhaler regimen, continue as needed albuterol. Continue Singulair Start paperwork for Dupixent. Follow-up  with me in 3 months after able to start Dupixent injections. Would like to see the patient after he is at least received 2 injections.    Current Outpatient Medications:    acetaminophen (TYLENOL) 500 MG tablet, Take 1,000 mg by mouth in the morning., Disp: , Rfl:    albuterol (PROVENTIL) (2.5 MG/3ML) 0.083% nebulizer solution, Take 3 mLs (2.5 mg total) by nebulization every 6 (six) hours as needed for wheezing or shortness of breath., Disp: 75 mL, Rfl: 12   albuterol (VENTOLIN HFA) 108 (90 Base) MCG/ACT inhaler, Inhale 2 puffs into the lungs every 6 (six) hours as needed for wheezing or shortness of  breath., Disp: , Rfl:    amLODipine (NORVASC) 10 MG tablet, Take 10 mg by mouth daily., Disp: , Rfl:    atorvastatin (LIPITOR) 80 MG tablet, Take 80 mg by mouth daily., Disp: , Rfl:    Budeson-Glycopyrrol-Formoterol (BREZTRI AEROSPHERE) 160-9-4.8 MCG/ACT AERO, Inhale 2 puffs into the lungs 2 (two) times daily. Take 2 puffs first thing in am and then another 2 puffs about 12 hours later., Disp: 10.7 g, Rfl: 11   cetirizine (ZYRTEC) 10 MG tablet, Take 10 mg by mouth 2 (two) times daily., Disp: , Rfl:    EPINEPHrine 0.3 mg/0.3 mL IJ SOAJ injection, Inject 0.3 mg into the muscle as needed for anaphylaxis., Disp: , Rfl:    ezetimibe (ZETIA) 10 MG tablet, Take 10 mg by mouth daily., Disp: , Rfl:    famotidine (PEPCID) 20 MG tablet, One after supper (Patient taking differently: Take 20 mg by mouth daily after supper.), Disp: 30 tablet, Rfl: 11   fluticasone (FLONASE) 50 MCG/ACT nasal spray, Place 2 sprays into both nostrils in the morning and at bedtime., Disp: , Rfl:    ipratropium-albuterol (DUONEB) 0.5-2.5 (3) MG/3ML SOLN, Take 3 mLs by nebulization every 6 (six) hours as needed (sob/wheezing)., Disp: , Rfl:    JARDIANCE 25 MG TABS tablet, Take 25 mg by mouth daily., Disp: , Rfl:    levothyroxine (SYNTHROID) 25 MCG tablet, Take 1 tablet (25 mcg total) by mouth daily before breakfast., Disp: 30 tablet,  Rfl: 2   losartan (COZAAR) 100 MG tablet, Take 100 mg by mouth daily., Disp: , Rfl:    metFORMIN (GLUCOPHAGE) 1000 MG tablet, Take 1,000 mg by mouth 2 (two) times daily with a meal., Disp: , Rfl:    montelukast (SINGULAIR) 10 MG tablet, Take 1 tablet (10 mg total) by mouth at bedtime., Disp: 30 tablet, Rfl: 0   ONETOUCH VERIO test strip, 1 each by Other route 5 (five) times daily., Disp: , Rfl:    pantoprazole (PROTONIX) 40 MG tablet, Take 1 tablet (40 mg total) by mouth 2 (two) times daily., Disp: 60 tablet, Rfl: 0   prochlorperazine (COMPAZINE) 10 MG tablet, Take 1 tablet (10 mg total) by mouth every 6 (six) hours as needed. (Patient taking differently: Take 10 mg by mouth every 6 (six) hours as needed for nausea or vomiting.), Disp: 30 tablet, Rfl: 0   REPATHA 140 MG/ML SOSY, Inject 140 mg into the skin every 14 (fourteen) days., Disp: , Rfl:    tamsulosin (FLOMAX) 0.4 MG CAPS capsule, Take 1 capsule (0.4 mg total) by mouth daily., Disp: 14 capsule, Rfl: 0   tirzepatide (MOUNJARO) 10 MG/0.5ML Pen, Inject 10 mg into the skin once a week. (Patient taking differently: Inject 10 mg into the skin every Friday.), Disp: 6 mL, Rfl: 3   Josephine Igo, DO [facility] 04/10/2023 2:18 PM

## 2023-04-10 NOTE — Patient Instructions (Signed)
Thank you for visiting Dr. Tonia Brooms at [facility]. Today we recommend the following:  Orders Placed This Encounter  Procedures   Perennial allergen profile IgE   IgE   Start dupixent paperwork   Return in about 3 months (around 07/11/2023) for w/ Dr. Tonia Brooms .    Please do your part to reduce the spread of COVID-19.

## 2023-04-11 ENCOUNTER — Telehealth: Payer: Self-pay | Admitting: Pharmacist

## 2023-04-11 ENCOUNTER — Inpatient Hospital Stay: Payer: BC Managed Care – PPO | Admitting: Physician Assistant

## 2023-04-11 ENCOUNTER — Inpatient Hospital Stay: Payer: BC Managed Care – PPO

## 2023-04-11 ENCOUNTER — Inpatient Hospital Stay: Payer: BC Managed Care – PPO | Attending: Oncology

## 2023-04-11 ENCOUNTER — Other Ambulatory Visit: Payer: Self-pay

## 2023-04-11 VITALS — BP 121/80 | HR 79

## 2023-04-11 DIAGNOSIS — Z905 Acquired absence of kidney: Secondary | ICD-10-CM | POA: Diagnosis not present

## 2023-04-11 DIAGNOSIS — Z79620 Long term (current) use of immunosuppressive biologic: Secondary | ICD-10-CM | POA: Diagnosis not present

## 2023-04-11 DIAGNOSIS — C642 Malignant neoplasm of left kidney, except renal pelvis: Secondary | ICD-10-CM | POA: Diagnosis not present

## 2023-04-11 DIAGNOSIS — Z5112 Encounter for antineoplastic immunotherapy: Secondary | ICD-10-CM | POA: Diagnosis not present

## 2023-04-11 LAB — CBC WITH DIFFERENTIAL (CANCER CENTER ONLY)
Abs Immature Granulocytes: 0.02 10*3/uL (ref 0.00–0.07)
Basophils Absolute: 0.1 10*3/uL (ref 0.0–0.1)
Basophils Relative: 1 %
Eosinophils Absolute: 0.6 10*3/uL — ABNORMAL HIGH (ref 0.0–0.5)
Eosinophils Relative: 9 %
HCT: 47.1 % (ref 39.0–52.0)
Hemoglobin: 16.2 g/dL (ref 13.0–17.0)
Immature Granulocytes: 0 %
Lymphocytes Relative: 25 %
Lymphs Abs: 1.7 10*3/uL (ref 0.7–4.0)
MCH: 32 pg (ref 26.0–34.0)
MCHC: 34.4 g/dL (ref 30.0–36.0)
MCV: 92.9 fL (ref 80.0–100.0)
Monocytes Absolute: 0.5 10*3/uL (ref 0.1–1.0)
Monocytes Relative: 7 %
Neutro Abs: 4 10*3/uL (ref 1.7–7.7)
Neutrophils Relative %: 58 %
Platelet Count: 260 10*3/uL (ref 150–400)
RBC: 5.07 MIL/uL (ref 4.22–5.81)
RDW: 12.3 % (ref 11.5–15.5)
WBC Count: 6.9 10*3/uL (ref 4.0–10.5)
nRBC: 0 % (ref 0.0–0.2)

## 2023-04-11 LAB — CMP (CANCER CENTER ONLY)
ALT: 24 U/L (ref 0–44)
AST: 19 U/L (ref 15–41)
Albumin: 4.5 g/dL (ref 3.5–5.0)
Alkaline Phosphatase: 61 U/L (ref 38–126)
Anion gap: 10 (ref 5–15)
BUN: 17 mg/dL (ref 6–20)
CO2: 23 mmol/L (ref 22–32)
Calcium: 9.6 mg/dL (ref 8.9–10.3)
Chloride: 106 mmol/L (ref 98–111)
Creatinine: 1.33 mg/dL — ABNORMAL HIGH (ref 0.61–1.24)
GFR, Estimated: >60 mL/min (ref >60)
Glucose, Bld: 139 mg/dL — ABNORMAL HIGH (ref 70–99)
Potassium: 4.3 mmol/L (ref 3.5–5.1)
Sodium: 139 mmol/L (ref 135–145)
Total Bilirubin: 0.4 mg/dL (ref 0.3–1.2)
Total Protein: 7.2 g/dL (ref 6.5–8.1)

## 2023-04-11 LAB — TSH: TSH: 3.224 u[IU]/mL (ref 0.350–4.500)

## 2023-04-11 MED ORDER — SODIUM CHLORIDE 0.9 % IV SOLN: Freq: Once | INTRAVENOUS | Status: AC

## 2023-04-11 MED ORDER — SODIUM CHLORIDE 0.9 % IV SOLN
200.0000 mg | Freq: Once | INTRAVENOUS | Status: AC
  Administered 2023-04-11: 200 mg via INTRAVENOUS
  Filled 2023-04-11: qty 200

## 2023-04-11 NOTE — Telephone Encounter (Addendum)
Received new start paperwork for Dupixent (placed in PAP pending info folder in pharmacy office). Submitted a Prior Authorization request to [facility] for DUPIXENT via CoverMyMeds. Will update once we receive a response.  Key: [id number] Dose: 400mg  SQ at Day 0 then 200mg  SQ every 14 days thereafter  Chesley Mires, PharmD, MPH, BCPS, CPP Clinical Pharmacist (Rheumatology and Pulmonology)

## 2023-04-11 NOTE — Patient Instructions (Signed)

## 2023-04-11 NOTE — Progress Notes (Signed)
[facility] OFFICE PROGRESS NOTE  [name], MD [address] 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   PRIOR THERAPY: Status post left laparoscopic radical nephrectomy on 03/12/2022 under the care of Dr. 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   CURRENT THERAPY: Adjuvant immunotherapy with Keytruda 200 Mg IV every 3 weeks status post 14 cycles.  Plan is to undergo 1 year of treatment 17 cycles   INTERVAL HISTORY: [facility] Burns 57 y.o. male returns to the clinic today for a follow-up visit. The patient is currently undergoing adjuvant immunotherapy with Keytruda IV every 3 weeks.  He is status post 13 cycles and has been tolerating it well. The patient denies any recent fever, chills, night sweats, or unexplained weight loss. Denies any nausea, vomiting, diarrhea, or constipation. Denies any current chest pain, shortness of breath, cough, or hemoptysis. The patient periodically goes to the ER for shortness of breath and oxygen saturation in the 80's. Work up showed chemical pneumonitis vs aspiration events. He saw Dr. Tonia Brooms yesterday. They feel this could be related to exposure and are checking IgE level, RAST panel, and starting him on Dupixent. He also will continue Singulair and inhalers. Denies any rashes or itching. Denies any new bone pain. Denies any headache or visual changes. Denies any abdominal pain or back pain. Denies any urinary changes. He is here today for evaluation repeat blood work before undergoing cycle #14.     MEDICAL HISTORY: Past Medical History:  Diagnosis Date   Allergy    Aortic stenosis    Arthritis     Blood transfusion without reported diagnosis    Cancer (HCC)    Chronic kidney disease    Diabetes mellitus without complication (HCC)    FH: thoracic aneurysm    Hyperlipidemia    Hypertension    Obesity     ALLERGIES:  is allergic to bee venom and other.  MEDICATIONS:  Current Outpatient Medications  Medication Sig Dispense Refill   acetaminophen (TYLENOL) 500 MG tablet Take 1,000 mg by mouth in the morning.     albuterol (PROVENTIL) (2.5 MG/3ML) 0.083% nebulizer solution Take 3 mLs (2.5 mg total) by nebulization every 6 (six) hours as needed for wheezing or shortness of breath. 75 mL 12   albuterol (VENTOLIN HFA) 108 (90 Base) MCG/ACT inhaler Inhale 2 puffs into the lungs every 6 (six) hours as needed for wheezing or shortness of breath.     amLODipine (NORVASC) 10 MG tablet Take 10 mg by mouth daily.     atorvastatin (LIPITOR) 80 MG tablet Take 80 mg by mouth daily.     Budeson-Glycopyrrol-Formoterol (BREZTRI AEROSPHERE) 160-9-4.8 MCG/ACT AERO Inhale 2 puffs into the lungs 2 (two) times daily. Take 2 puffs first thing in am and then another 2 puffs about 12 hours later. 10.7 g 11   cetirizine (ZYRTEC) 10 MG tablet Take 10 mg by mouth 2 (two) times daily.     EPINEPHrine 0.3 mg/0.3 mL IJ SOAJ injection Inject 0.3 mg into the muscle as needed for anaphylaxis.     ezetimibe (ZETIA) 10 MG tablet Take 10 mg by mouth daily.     famotidine (PEPCID) 20  MG tablet One after supper (Patient taking differently: Take 20 mg by mouth daily after supper.) 30 tablet 11   fluticasone (FLONASE) 50 MCG/ACT nasal spray Place 2 sprays into both nostrils in the morning and at bedtime.     ipratropium-albuterol (DUONEB) 0.5-2.5 (3) MG/3ML SOLN Take 3 mLs by nebulization every 6 (six) hours as needed (sob/wheezing).     JARDIANCE 25 MG TABS tablet Take 25 mg by mouth daily.     levothyroxine (SYNTHROID) 25 MCG tablet Take 1 tablet (25 mcg total) by mouth daily before breakfast. 30 tablet 2   losartan  (COZAAR) 100 MG tablet Take 100 mg by mouth daily.     metFORMIN (GLUCOPHAGE) 1000 MG tablet Take 1,000 mg by mouth 2 (two) times daily with a meal.     montelukast (SINGULAIR) 10 MG tablet Take 1 tablet (10 mg total) by mouth at bedtime. 30 tablet 0   ONETOUCH VERIO test strip 1 each by Other route 5 (five) times daily.     pantoprazole (PROTONIX) 40 MG tablet Take 1 tablet (40 mg total) by mouth 2 (two) times daily. 60 tablet 0   prochlorperazine (COMPAZINE) 10 MG tablet Take 1 tablet (10 mg total) by mouth every 6 (six) hours as needed. (Patient taking differently: Take 10 mg by mouth every 6 (six) hours as needed for nausea or vomiting.) 30 tablet 0   REPATHA 140 MG/ML SOSY Inject 140 mg into the skin every 14 (fourteen) days.     tamsulosin (FLOMAX) 0.4 MG CAPS capsule Take 1 capsule (0.4 mg total) by mouth daily. 14 capsule 0   tirzepatide (MOUNJARO) 10 MG/0.5ML Pen Inject 10 mg into the skin once a week. (Patient taking differently: Inject 10 mg into the skin every Friday.) 6 mL 3   No current facility-administered medications for this visit.    SURGICAL HISTORY:  Past Surgical History:  Procedure Laterality Date   CYSTOSCOPY WITH URETEROSCOPY AND STENT PLACEMENT Left 02/12/2022   Procedure: CYSTOSCOPY WITH LEFT RETROGRADE PYELOGRAM, DIAGNOSTIC URETEROSCOPY AND STENT PLACEMENT;  Surgeon: Crista Elliot, MD;  Location: [facility];  Service: Urology;  Laterality: Left;   LAPAROSCOPIC NEPHRECTOMY, HAND ASSISTED Left 03/12/2022   Procedure: LEFT HAND ASSISTED LAPAROSCOPIC NEPHRECTOMY;  Surgeon: Crista Elliot, MD;  Location: [facility];  Service: Urology;  Laterality: Left;  NEED 2.5 HRS FOR THIS CASE   TONSILLECTOMY     WISDOM TOOTH EXTRACTION      REVIEW OF SYSTEMS:   Review of Systems  Constitutional: Negative for appetite change, chills, fatigue, fever and unexpected weight change.  HENT:   Negative for mouth sores, nosebleeds, sore throat and trouble swallowing.   Eyes: Negative for  eye problems and icterus.  Respiratory: Negative for cough, hemoptysis, shortness of breath and wheezing.   Cardiovascular: Negative for chest pain and leg swelling.  Gastrointestinal: Negative for abdominal pain, constipation, diarrhea, nausea and vomiting.  Genitourinary: Negative for bladder incontinence, difficulty urinating, dysuria, frequency and hematuria.   Musculoskeletal: Negative for back pain, gait problem, neck pain and neck stiffness.  Skin: Negative for itching and rash.  Neurological: Negative for dizziness, extremity weakness, gait problem, headaches, light-headedness and seizures.  Hematological: Negative for adenopathy. Does not bruise/bleed easily.  Psychiatric/Behavioral: Negative for confusion, depression and sleep disturbance. The patient is not nervous/anxious.     PHYSICAL EXAMINATION:  Blood pressure 109/79, pulse 82, temperature (!) 97.3 F (36.3 C), resp. rate 20, weight 241 lb 11.2 oz (109.6 kg), SpO2 97%.  ECOG  PERFORMANCE STATUS: 1  Physical Exam  Constitutional: Oriented to person, place, and time and well-developed, well-nourished, and in no distress.  HENT:  Head: Normocephalic and atraumatic.  Mouth/Throat: Oropharynx is clear and moist. No oropharyngeal exudate.  Eyes: Conjunctivae are normal. Right eye exhibits no discharge. Left eye exhibits no discharge. No scleral icterus.  Neck: Normal range of motion. Neck supple.  Cardiovascular: Normal rate, regular rhythm, normal heart sounds and intact distal pulses.   Pulmonary/Chest: Effort normal and breath sounds normal. No respiratory distress. No wheezes. No rales.  Abdominal: Soft. Bowel sounds are normal. Exhibits no distension and no mass. There is no tenderness.  Musculoskeletal: Normal range of motion. Exhibits no edema.  Lymphadenopathy:    No cervical adenopathy.  Neurological: Alert and oriented to person, place, and time. Exhibits normal muscle tone. Gait normal. Coordination normal.  Skin:  Skin is warm and dry. No rash noted. Not diaphoretic. No erythema. No pallor.  Psychiatric: Mood, memory and judgment normal.  Vitals reviewed.  LABORATORY DATA: Lab Results  Component Value Date   WBC 6.9 04/11/2023   HGB 16.2 04/11/2023   HCT 47.1 04/11/2023   MCV 92.9 04/11/2023   PLT 260 04/11/2023      Chemistry      Component Value Date/Time   NA 139 03/21/2023 0912   NA 139 07/25/2022 0935   K 4.6 03/21/2023 0912   CL 105 03/21/2023 0912   CO2 24 03/21/2023 0912   BUN 18 03/21/2023 0912   BUN 23 07/25/2022 0935   CREATININE 1.32 (H) 03/21/2023 0912      Component Value Date/Time   CALCIUM 9.0 03/21/2023 0912   ALKPHOS 61 03/21/2023 0912   AST 16 03/21/2023 0912   ALT 21 03/21/2023 0912   BILITOT 0.3 03/21/2023 0912       RADIOGRAPHIC STUDIES:  No results found.   ASSESSMENT/PLAN:  This is a very pleasant 57 year old Caucasian male diagnosed with stage IIIa (T3a, N0, M0) clear-cell renal cell carcinoma.  He does not have any evidence of metastatic disease.  He was diagnosed in June 2023.   He is status post left laparoscopic radical nephrectomy on 03/12/2022 under the care of Dr. Alvester Morin.  The final pathology showed clear cell renal cell carcinoma nuclear grade 4 measuring 7.0 cm with tumor extension into the renal vein and renal sinus.   The patient is currently undergoing adjuvant immunotherapy with Keytruda 200 mg IV every 3 weeks. He is status post 13 cycles. The plan is to undergo 1 year of treatment which is 17 cycles.   Labs were reviewed.  Recommend that he proceed with cycle #14 today as schedule.   We will see him back for follow-up visit in 3 weeks for evaluation and repeat blood work before undergoing cycle #15.  He will continue to follow with pulmonary medicine.    The patient was advised to call immediately if she has any concerning symptoms in the interval. The patient voices understanding of current disease status and treatment options and is  in agreement with the current care plan. All questions were answered. The patient knows to call the clinic with any problems, questions or concerns. We can certainly see the patient much sooner if necessary  No orders of the defined types were placed in this encounter.    The total time spent in the appointment was 20-29 minutes  Davanna He L Florice Hindle, PA-C 04/11/23

## 2023-04-12 LAB — T4: T4, Total: 6.9 ug/dL (ref 4.5–12.0)

## 2023-04-13 LAB — ALLERGEN PROFILE, PERENNIAL ALLERGEN IGE
Alternaria Alternata IgE: <0.1 kU/L
Aspergillus Fumigatus IgE: <0.1 kU/L
Aureobasidi Pullulans IgE: <0.1 kU/L
Candida Albicans IgE: <0.1 kU/L
Cat Dander IgE: 14.1 kU/L — AB
Chicken Feathers IgE: <0.1 kU/L
Cladosporium Herbarum IgE: <0.1 kU/L
Cow Dander IgE: 0.67 kU/L — AB
D Farinae IgE: 0.21 kU/L — AB
D Pteronyssinus IgE: <0.1 kU/L
Dog Dander IgE: 5.16 kU/L — AB
Duck Feathers IgE: <0.1 kU/L
Goose Feathers IgE: <0.1 kU/L
Mouse Urine IgE: <0.1 kU/L
Mucor Racemosus IgE: <0.1 kU/L
Penicillium Chrysogen IgE: <0.1 kU/L
Phoma Betae IgE: <0.1 kU/L
Setomelanomma Rostrat: <0.1 kU/L
Stemphylium Herbarum IgE: <0.1 kU/L

## 2023-04-13 LAB — IGE: IgE (Immunoglobulin E), Serum: 355 IU/mL (ref 6–495)

## 2023-04-15 ENCOUNTER — Other Ambulatory Visit (HOSPITAL_COMMUNITY): Payer: Self-pay

## 2023-04-15 NOTE — Telephone Encounter (Signed)
Received notification from [facility] regarding a prior authorization for DUPIXENT. Authorization has been APPROVED from 04/14/23 to 10/15/2023. Approval letter sent to scan center.  Unable to run test claim because patient must fill through [facility]: [phone]  Authorization # [id number]  Patient enrolled into Dupixent copay card via email (pt confirmed email address).  Staff message sent to oncology team for clearance to proceed with Dupixent. Patient currently taking Keytruda for neoplasm of left kidney.  Chesley Mires, PharmD, MPH, BCPS, CPP Clinical Pharmacist (Rheumatology and Pulmonology)

## 2023-04-17 ENCOUNTER — Telehealth: Payer: Self-pay | Admitting: Pharmacist

## 2023-04-17 NOTE — Telephone Encounter (Signed)
Patient cleared by oncology to start Dupixent. Pt advised of this and he is scheduled for Dupixent new start tomorrow, 04/18/23. Will use sample of 200mg  pens.  Chesley Mires, PharmD, MPH, BCPS, CPP Clinical Pharmacist (Rheumatology and Pulmonology)

## 2023-04-17 NOTE — Telephone Encounter (Signed)
-----   Message from Cassandra L Heilingoetter sent at 04/17/2023  1:05 PM EDT ----- Regarding: RE: Dupixent Initiation Good Afternoon,   I do not see any drug to drug interactions with [place] and Dupixent.. I double checked with our pharmacy team and they do not see any contraindication or drug interactions as well. From our standpoint, should be ok to start. Thank you for checking.   Cassie ----- Message ----- From: Murrell Redden, RPH-CPP Sent: 04/15/2023   4:18 PM EDT To: Si Gaul, MD; # Subject: Dupixent Initiation                            Hi Dr. Arbutus Ped and Elonda Husky,  I work with Dr. Tonia Brooms at [facility]. We were planning on starting Mr. Dent[place]ont on Dupixent. He is approved but wanted to confirm with the oncology team that he is cleared to start treatment with this biologic while continues to receive his Keytruda cycle.   Thank you for your advisement!  Chesley Mires, PharmD, MPH, BCPS, CPP Clinical Pharmacist (Rheumatology and Pulmonology)

## 2023-04-18 ENCOUNTER — Ambulatory Visit: Payer: BC Managed Care – PPO | Admitting: Pharmacist

## 2023-04-18 DIAGNOSIS — Z7189 Other specified counseling: Secondary | ICD-10-CM

## 2023-04-18 DIAGNOSIS — J4530 Mild persistent asthma, uncomplicated: Secondary | ICD-10-CM

## 2023-04-18 MED ORDER — DUPIXENT 200 MG/1.14ML ~~LOC~~ SOAJ
200.0000 mg | SUBCUTANEOUS | 1 refills | Status: DC
Start: 2023-04-18 — End: 2023-10-09

## 2023-04-18 NOTE — Patient Instructions (Signed)
Your next Dupixent dose is due on 8/8, 8/22, and every 14 days thereafter  CONTINUE Breztri inhale 2 puffs twice daily, Singulair 10mg  by mouth once daily, and DuoNeb nebulization as needed  Your prescription will be shipped from [facility]: [phone]. Please call to schedule shipment and confirm address. They will mail your medication to your home.  You will need to be seen by your provider in 3 to 4 months to assess how Dupixent is working for you. Please ensure you have a follow-up appointment scheduled in October or November. Call our clinic if you need to make this appointment.  How to manage an injection site reaction: Remember the 5 C's: COUNTER - leave on the counter at least 30 minutes but up to overnight to bring medication to room temperature. This may help prevent stinging COLD - place something cold (like an ice gel pack or cold water bottle) on the injection site just before cleansing with alcohol. This may help reduce pain CLARITIN - use Claritin (generic name is loratadine) for the first two weeks of treatment or the day of, the day before, and the day after injecting. This will help to minimize injection site reactions CORTISONE CREAM - apply if injection site is irritated and itching CALL ME - if injection site reaction is bigger than the size of your fist, looks infected, blisters, or if you develop hives

## 2023-04-18 NOTE — Progress Notes (Signed)
HPI Patient presents today to [facility] to see pharmacy team for Dupixent new start.  Past medical history includes asthma, stage III renal cell carcinoma (being treated with [facility])  Number of hospitalizations in past year: about 6- hypoxia due to flare chemical exposure at work and kidney removal due to cancer  Most recent hospitalization was May 28th for hypoxia Number of 20 COPD/asthma exacerbations in past year (includes both hospitalizations and mild-moderate cases where the nebulizer has been able to relieve the exacerbation)  Respiratory Medications Current regimen: Breztri, montelukast, DuoNeb Tried in past: Advair, Anoro Patient reports no known adherence challenges  OBJECTIVE Allergies  Allergen Reactions   Bee Venom Anaphylaxis   Other Itching, Swelling and Other (See Comments)    Feline dander- Runny nose, itchy/puffy eyes    Outpatient Encounter Medications as of 04/18/2023  Medication Sig   acetaminophen (TYLENOL) 500 MG tablet Take 1,000 mg by mouth in the morning.   albuterol (VENTOLIN HFA) 108 (90 Base) MCG/ACT inhaler Inhale 2 puffs into the lungs every 6 (six) hours as needed for wheezing or shortness of breath.   amLODipine (NORVASC) 10 MG tablet Take 10 mg by mouth daily.   atorvastatin (LIPITOR) 80 MG tablet Take 80 mg by mouth daily.   Budeson-Glycopyrrol-Formoterol (BREZTRI AEROSPHERE) 160-9-4.8 MCG/ACT AERO Inhale 2 puffs into the lungs 2 (two) times daily. Take 2 puffs first thing in am and then another 2 puffs about 12 hours later.   cetirizine (ZYRTEC) 10 MG tablet Take 10 mg by mouth 2 (two) times daily.   Dupilumab (DUPIXENT) 200 MG/1. SOPN Inject 200 mg into the skin every 14 (fourteen) days. Loading dose received in clinic on 7/25.   EPINEPHrine 0.3 mg/0.3 mL IJ SOAJ injection Inject 0.3 mg into the muscle as needed for anaphylaxis.   ezetimibe (ZETIA) 10 MG tablet Take 10 mg by mouth daily.   famotidine (PEPCID) 20 MG tablet One after  supper (Patient taking differently: Take 20 mg by mouth daily after supper.)   fluticasone (FLONASE) 50 MCG/ACT nasal spray Place 2 sprays into both nostrils in the morning and at bedtime.   ipratropium-albuterol (DUONEB) 0.5-2.5 (3) MG/3ML SOLN Take 3 mLs by nebulization every 6 (six) hours as needed (sob/wheezing).   JARDIANCE 25 MG TABS tablet Take 25 mg by mouth daily.   levothyroxine (SYNTHROID) 25 MCG tablet Take 1 tablet (25 mcg total) by mouth daily before breakfast.   losartan (COZAAR) 100 MG tablet Take 100 mg by mouth daily.   metFORMIN (GLUCOPHAGE) 1000 MG tablet Take 1,000 mg by mouth 2 (two) times daily with a meal.   montelukast (SINGULAIR) 10 MG tablet Take 1 tablet (10 mg total) by mouth at bedtime.   ONETOUCH VERIO test strip 1 each by Other route 5 (five) times daily.   pantoprazole (PROTONIX) 40 MG tablet Take 1 tablet (40 mg total) by mouth 2 (two) times daily.   prochlorperazine (COMPAZINE) 10 MG tablet Take 1 tablet (10 mg total) by mouth every 6 (six) hours as needed. (Patient taking differently: Take 10 mg by mouth every 6 (six) hours as needed for nausea or vomiting.)   REPATHA 140 MG/ML SOSY Inject 140 mg into the skin every 14 (fourteen) days.   tamsulosin (FLOMAX) 0.4 MG CAPS capsule Take 1 capsule (0.4 mg total) by mouth daily.   tirzepatide (MOUNJARO) 10 MG/0.5ML Pen Inject 10 mg into the skin once a week. (Patient taking differently: Inject 10 mg into the skin every Friday.)   albuterol (  PROVENTIL) (2.5 MG/3ML) 0.083% nebulizer solution Take 3 mLs (2.5 mg total) by nebulization every 6 (six) hours as needed for wheezing or shortness of breath. (Patient not taking: Reported on 04/18/2023)   No facility-administered encounter medications on file as of 04/18/2023.     Immunization History  Administered Date(s) Administered   Influenza-Unspecified 08/13/2022   PFIZER(Purple Top)SARS-COV-2 Vaccination 12/18/2019, 01/12/2020     PFTs    Latest Ref Rng & Units  12/11/2022    2:49 PM  PFT Results  FVC-Pre L 3.94   FVC-Predicted Pre % 80   FVC-Post L 3.95   FVC-Predicted Post % 80   Pre FEV1/FVC % % 73   Post FEV1/FCV % % 78   FEV1-Pre L 2.88   FEV1-Predicted Pre % 76   FEV1-Post L 3.07   DLCO uncorrected ml/min/mmHg 33.96   DLCO UNC% % 119   DLCO corrected ml/min/mmHg 32.89   DLCO COR %Predicted % 116   DLVA Predicted % 139   TLC L 5.97   TLC % Predicted % 85   RV % Predicted % 106      Eosinophils Most recent blood eosinophil count was 600 cells/microL taken on 04/11/23.   IgE: 355 on 04/10/23   Assessment   Biologics training for dupilumab (Dupixent)  Goals of therapy: Mechanism: human monoclonal IgG4 antibody that inhibits interleukin-4 and interleukin-13 cytokine-induced responses, including release of proinflammatory cytokines, chemokines, and IgE Reviewed that Dupixent is add-on medication and patient must continue maintenance inhaler regimen. Response to therapy: may take 4 months to determine efficacy. Discussed that patients generally feel improvement sooner than 4 months.  Side effects: injection site reaction (6-18%), antibody development (5-16%), ophthalmic conjunctivitis (2-16%), transient blood eosinophilia (1-2%)  Dose: 400mg  at Week 0 (administered today in clinic) followed by 200mg  every 14 days thereafter  Administration/Storage:  Reviewed administration sites of thigh or abdomen (at least 2-3 inches away from abdomen). Reviewed the upper arm is only appropriate if caregiver is administering injection  Do not shake pen/syringe as this could lead to product foaming or precipitation. Do not use if solution is discolored or contains particulate matter or if window on prefilled pen is yellow (indicates pen has been used).  Reviewed storage of medication in refrigerator. Reviewed that Dupixent can be stored at room temperature in unopened carton for up to 14 days.  Access: Approval of Dupixent through:  insurance Patient enrolled into copay card program to help with copay assistance. Dupixent CoPay Card info per patient's email: RxBIN: [id number] RxPCn Loyalty Rx Grp: [id number] ID: [id number]  Patient self-administered Dupixent 200mg /1.35ml x 2 (total dose 400mg ) in right lower abdomen and left lower abdomen using sample Dupixent 200mg /1.101mL autoinjector pen NDC: 2130-8657-84 Lot: [id number] Expiration: 2023-03-24  Patient monitored for 30 minutes for adverse reaction.  Patient tolerated Dupixent. Injection site checked and no had no adverse reactions.  Medication Reconciliation  A drug regimen assessment was performed, including review of allergies, interactions, disease-state management, dosing and immunization history. Medications were reviewed with the patient, including name, instructions, indication, goals of therapy, potential side effects, importance of adherence, and safe use.   PLAN Continue Dupixent 200 mg subcut every 14 days.  Next dose is due 8/8 and every 14 days thereafter. [facility]: [facility]: [phone].  Patient provided with pharmacy phone number and advised to call later this week to schedule shipment to home. Patient has copay card information to provide to pharmacy if quoted copay exceeds $5 per month. Clearance received from  oncology to proceed with Dupixent while completing remaining chemotherapy maintenance asthma regimen of: Breztri, montelukast, DuoNeb  All questions encouraged and answered.  Instructed patient to reach out with any further questions or concerns.  Thank you for allowing pharmacy to participate in this patient's care.  This appointment required 60 minutes of patient care (this includes precharting, chart review, review of results, face-to-face care, etc.).  Chesley Mires, PharmD, MPH, BCPS, CPP Clinical Pharmacist (Rheumatology and Pulmonology)

## 2023-04-18 NOTE — Telephone Encounter (Signed)
Dupixent copay card: RxBIN: [id number] RxPCn Loyalty Rx Grp: [id number] ID: [id number]

## 2023-04-28 DIAGNOSIS — G4733 Obstructive sleep apnea (adult) (pediatric): Secondary | ICD-10-CM | POA: Diagnosis not present

## 2023-04-28 DIAGNOSIS — I10 Essential (primary) hypertension: Secondary | ICD-10-CM | POA: Diagnosis not present

## 2023-04-30 ENCOUNTER — Encounter: Payer: Self-pay | Admitting: Internal Medicine

## 2023-05-01 ENCOUNTER — Encounter: Payer: Self-pay | Admitting: Medical Oncology

## 2023-05-02 ENCOUNTER — Ambulatory Visit: Payer: BC Managed Care – PPO | Admitting: Internal Medicine

## 2023-05-02 ENCOUNTER — Other Ambulatory Visit: Payer: BC Managed Care – PPO

## 2023-05-02 ENCOUNTER — Ambulatory Visit: Payer: BC Managed Care – PPO

## 2023-05-07 DIAGNOSIS — E785 Hyperlipidemia, unspecified: Secondary | ICD-10-CM | POA: Diagnosis not present

## 2023-05-07 DIAGNOSIS — D649 Anemia, unspecified: Secondary | ICD-10-CM | POA: Diagnosis not present

## 2023-05-07 DIAGNOSIS — Z125 Encounter for screening for malignant neoplasm of prostate: Secondary | ICD-10-CM | POA: Diagnosis not present

## 2023-05-07 DIAGNOSIS — N1831 Chronic kidney disease, stage 3a: Secondary | ICD-10-CM | POA: Diagnosis not present

## 2023-05-07 DIAGNOSIS — E032 Hypothyroidism due to medicaments and other exogenous substances: Secondary | ICD-10-CM | POA: Diagnosis not present

## 2023-05-08 ENCOUNTER — Encounter: Payer: Self-pay | Admitting: Pulmonary Disease

## 2023-05-09 DIAGNOSIS — Z1212 Encounter for screening for malignant neoplasm of rectum: Secondary | ICD-10-CM | POA: Diagnosis not present

## 2023-05-10 DIAGNOSIS — E1165 Type 2 diabetes mellitus with hyperglycemia: Secondary | ICD-10-CM | POA: Diagnosis not present

## 2023-05-10 DIAGNOSIS — Z0000 Encounter for general adult medical examination without abnormal findings: Secondary | ICD-10-CM | POA: Diagnosis not present

## 2023-05-10 DIAGNOSIS — R82998 Other abnormal findings in urine: Secondary | ICD-10-CM | POA: Diagnosis not present

## 2023-05-10 DIAGNOSIS — Z23 Encounter for immunization: Secondary | ICD-10-CM | POA: Diagnosis not present

## 2023-05-10 DIAGNOSIS — Z1331 Encounter for screening for depression: Secondary | ICD-10-CM | POA: Diagnosis not present

## 2023-05-10 DIAGNOSIS — Z1389 Encounter for screening for other disorder: Secondary | ICD-10-CM | POA: Diagnosis not present

## 2023-05-23 ENCOUNTER — Inpatient Hospital Stay: Payer: BC Managed Care – PPO

## 2023-05-23 ENCOUNTER — Ambulatory Visit: Payer: BC Managed Care – PPO

## 2023-05-23 ENCOUNTER — Other Ambulatory Visit: Payer: BC Managed Care – PPO

## 2023-05-23 ENCOUNTER — Ambulatory Visit: Payer: BC Managed Care – PPO | Admitting: Internal Medicine

## 2023-05-23 ENCOUNTER — Inpatient Hospital Stay: Payer: BC Managed Care – PPO | Attending: Oncology | Admitting: Internal Medicine

## 2023-05-23 VITALS — BP 119/95

## 2023-05-23 DIAGNOSIS — E039 Hypothyroidism, unspecified: Secondary | ICD-10-CM | POA: Diagnosis not present

## 2023-05-23 DIAGNOSIS — C642 Malignant neoplasm of left kidney, except renal pelvis: Secondary | ICD-10-CM

## 2023-05-23 DIAGNOSIS — Z5112 Encounter for antineoplastic immunotherapy: Secondary | ICD-10-CM | POA: Insufficient documentation

## 2023-05-23 DIAGNOSIS — Z79890 Hormone replacement therapy (postmenopausal): Secondary | ICD-10-CM | POA: Diagnosis not present

## 2023-05-23 DIAGNOSIS — Z905 Acquired absence of kidney: Secondary | ICD-10-CM | POA: Diagnosis not present

## 2023-05-23 LAB — CMP (CANCER CENTER ONLY)
ALT: 25 U/L (ref 0–44)
AST: 20 U/L (ref 15–41)
Albumin: 4.6 g/dL (ref 3.5–5.0)
Alkaline Phosphatase: 60 U/L (ref 38–126)
Anion gap: 8 (ref 5–15)
BUN: 17 mg/dL (ref 6–20)
CO2: 26 mmol/L (ref 22–32)
Calcium: 9.4 mg/dL (ref 8.9–10.3)
Chloride: 105 mmol/L (ref 98–111)
Creatinine: 1.41 mg/dL — ABNORMAL HIGH (ref 0.61–1.24)
GFR, Estimated: 58 mL/min — ABNORMAL LOW (ref >60)
Glucose, Bld: 149 mg/dL — ABNORMAL HIGH (ref 70–99)
Potassium: 4.5 mmol/L (ref 3.5–5.1)
Sodium: 139 mmol/L (ref 135–145)
Total Bilirubin: 0.4 mg/dL (ref 0.3–1.2)
Total Protein: 7.4 g/dL (ref 6.5–8.1)

## 2023-05-23 LAB — CBC WITH DIFFERENTIAL (CANCER CENTER ONLY)
Abs Immature Granulocytes: 0.01 10*3/uL (ref 0.00–0.07)
Basophils Absolute: 0.1 10*3/uL (ref 0.0–0.1)
Basophils Relative: 1 %
Eosinophils Absolute: 0.8 10*3/uL — ABNORMAL HIGH (ref 0.0–0.5)
Eosinophils Relative: 10 %
HCT: 47.6 % (ref 39.0–52.0)
Hemoglobin: 16.2 g/dL (ref 13.0–17.0)
Immature Granulocytes: 0 %
Lymphocytes Relative: 20 %
Lymphs Abs: 1.5 10*3/uL (ref 0.7–4.0)
MCH: 31.2 pg (ref 26.0–34.0)
MCHC: 34 g/dL (ref 30.0–36.0)
MCV: 91.7 fL (ref 80.0–100.0)
Monocytes Absolute: 0.5 10*3/uL (ref 0.1–1.0)
Monocytes Relative: 6 %
Neutro Abs: 4.8 10*3/uL (ref 1.7–7.7)
Neutrophils Relative %: 63 %
Platelet Count: 263 10*3/uL (ref 150–400)
RBC: 5.19 MIL/uL (ref 4.22–5.81)
RDW: 11.9 % (ref 11.5–15.5)
WBC Count: 7.7 10*3/uL (ref 4.0–10.5)
nRBC: 0 % (ref 0.0–0.2)

## 2023-05-23 MED ORDER — SODIUM CHLORIDE 0.9 % IV SOLN
200.0000 mg | Freq: Once | INTRAVENOUS | Status: AC
  Administered 2023-05-23: 200 mg via INTRAVENOUS
  Filled 2023-05-23: qty 200

## 2023-05-23 MED ORDER — SODIUM CHLORIDE 0.9 % IV SOLN: Freq: Once | INTRAVENOUS | Status: AC

## 2023-05-23 NOTE — Progress Notes (Signed)
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  PRIOR THERAPY: Status post left laparoscopic radical nephrectomy on 03/12/2022 under the care of Dr. 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  CURRENT THERAPY: Adjuvant immunotherapy with Keytruda 200 Mg IV every 3 weeks status post 15 cycles.  INTERVAL HISTORY: Russell Burns 57 y.o. male returns to the clinic today for follow-up visit.  The patient missed the last cycle of his treatment 3 weeks ago because of his diagnosis with COVID-19 on April 29, 2023.  He is feeling much better now with no concerning complaints.  He denied having any current chest pain, shortness of breath, cough or hemoptysis.  He has no nausea, vomiting, diarrhea or constipation.  He has no headache or visual changes.  He is here today for evaluation before resuming his treatment with cycle #16.  MEDICAL HISTORY: Past Medical History:  Diagnosis Date   Allergy    Aortic stenosis    Arthritis    Blood transfusion without reported diagnosis    Cancer (HCC)    Chronic kidney disease    Diabetes mellitus without complication (HCC)    FH: thoracic aneurysm    Hyperlipidemia    Hypertension    Obesity     ALLERGIES:  is allergic to bee venom and other.  MEDICATIONS:  Current Outpatient Medications  Medication Sig Dispense Refill   acetaminophen (TYLENOL) 500 MG tablet Take 1,000 mg by mouth in the morning.     albuterol (PROVENTIL) (2.5 MG/3ML) 0.083% nebulizer solution Take 3 mLs (2.5 mg total) by nebulization every 6 (six) hours as needed for wheezing or shortness of breath. (Patient not taking: Reported on  04/18/2023) 75 mL 12   albuterol (VENTOLIN HFA) 108 (90 Base) MCG/ACT inhaler Inhale 2 puffs into the lungs every 6 (six) hours as needed for wheezing or shortness of breath.     amLODipine (NORVASC) 10 MG tablet Take 10 mg by mouth daily.     atorvastatin (LIPITOR) 80 MG tablet Take 80 mg by mouth daily.     Budeson-Glycopyrrol-Formoterol (BREZTRI AEROSPHERE) 160-9-4.8 MCG/ACT AERO Inhale 2 puffs into the lungs 2 (two) times daily. Take 2 puffs first thing in am and then another 2 puffs about 12 hours later. 10.7 g 11   cetirizine (ZYRTEC) 10 MG tablet Take 10 mg by mouth 2 (two) times daily.     Dupilumab (DUPIXENT) 200 MG/1. SOPN Inject 200 mg into the skin every 14 (fourteen) days. Loading dose received in clinic on 7/25. 6.84 mL 1   EPINEPHrine 0.3 mg/0.3 mL IJ SOAJ injection Inject 0.3 mg into the muscle as needed for anaphylaxis.     ezetimibe (ZETIA) 10 MG tablet Take 10 mg by mouth daily.     famotidine (PEPCID) 20 MG tablet One after supper (Patient taking differently: Take 20 mg by mouth daily after supper.) 30 tablet 11   fluticasone (FLONASE) 50 MCG/ACT nasal spray Place 2 sprays into both nostrils in the morning and at bedtime.     ipratropium-albuterol (DUONEB) 0.5-2.5 (3) MG/3ML SOLN Take 3 mLs by nebulization every 6 (six) hours as needed (sob/wheezing).     JARDIANCE 25 MG  TABS tablet Take 25 mg by mouth daily.     levothyroxine (SYNTHROID) 25 MCG tablet Take 1 tablet (25 mcg total) by mouth daily before breakfast. 30 tablet 2   losartan (COZAAR) 100 MG tablet Take 100 mg by mouth daily.     metFORMIN (GLUCOPHAGE) 1000 MG tablet Take 1,000 mg by mouth 2 (two) times daily with a meal.     montelukast (SINGULAIR) 10 MG tablet Take 1 tablet (10 mg total) by mouth at bedtime. 30 tablet 0   ONETOUCH VERIO test strip 1 each by Other route 5 (five) times daily.     pantoprazole (PROTONIX) 40 MG tablet Take 1 tablet (40 mg total) by mouth 2 (two) times daily. 60 tablet 0    prochlorperazine (COMPAZINE) 10 MG tablet Take 1 tablet (10 mg total) by mouth every 6 (six) hours as needed. (Patient taking differently: Take 10 mg by mouth every 6 (six) hours as needed for nausea or vomiting.) 30 tablet 0   REPATHA 140 MG/ML SOSY Inject 140 mg into the skin every 14 (fourteen) days.     tamsulosin (FLOMAX) 0.4 MG CAPS capsule Take 1 capsule (0.4 mg total) by mouth daily. 14 capsule 0   tirzepatide (MOUNJARO) 10 MG/0.5ML Pen Inject 10 mg into the skin once a week. (Patient taking differently: Inject 10 mg into the skin every Friday.) 6 mL 3   No current facility-administered medications for this visit.    SURGICAL HISTORY:  Past Surgical History:  Procedure Laterality Date   CYSTOSCOPY WITH URETEROSCOPY AND STENT PLACEMENT Left 02/12/2022   Procedure: CYSTOSCOPY WITH LEFT RETROGRADE PYELOGRAM, DIAGNOSTIC URETEROSCOPY AND STENT PLACEMENT;  Surgeon: Crista Elliot, MD;  Location: [facility];  Service: Urology;  Laterality: Left;   LAPAROSCOPIC NEPHRECTOMY, HAND ASSISTED Left 03/12/2022   Procedure: LEFT HAND ASSISTED LAPAROSCOPIC NEPHRECTOMY;  Surgeon: Crista Elliot, MD;  Location: [facility];  Service: Urology;  Laterality: Left;  NEED 2.5 HRS FOR THIS CASE   TONSILLECTOMY     WISDOM TOOTH EXTRACTION      REVIEW OF SYSTEMS:  A comprehensive review of systems was negative.   PHYSICAL EXAMINATION: General appearance: alert, cooperative, and no distress Head: Normocephalic, without obvious abnormality, atraumatic Neck: no adenopathy, no JVD, supple, symmetrical, trachea midline, and thyroid not enlarged, symmetric, no tenderness/mass/nodules Lymph nodes: Cervical, supraclavicular, and axillary nodes normal. Resp: clear to auscultation bilaterally Back: symmetric, no curvature. ROM normal. No CVA tenderness. Cardio: regular rate and rhythm, S1, S2 normal, no murmur, click, rub or gallop GI: soft, non-tender; bowel sounds normal; no masses,  no organomegaly Extremities:  extremities normal, atraumatic, no cyanosis or edema  ECOG PERFORMANCE STATUS: 0 - Asymptomatic  Pulse 81, temperature 97.8 F (36.6 C), temperature source Oral, resp. rate 17, height 5\' 10"  (1.778 m), weight 238 lb (108 kg), SpO2 94%.  LABORATORY DATA: Lab Results  Component Value Date   WBC 6.9 04/11/2023   HGB 16.2 04/11/2023   HCT 47.1 04/11/2023   MCV 92.9 04/11/2023   PLT 260 04/11/2023      Chemistry      Component Value Date/Time   NA 139 04/11/2023 1407   NA 139 07/25/2022 0935   K 4.3 04/11/2023 1407   CL 106 04/11/2023 1407   CO2 23 04/11/2023 1407   BUN 17 04/11/2023 1407   BUN 23 07/25/2022 0935   CREATININE 1.33 (H) 04/11/2023 1407      Component Value Date/Time   CALCIUM 9.6 04/11/2023 1407  ALKPHOS 61 04/11/2023 1407   AST 19 04/11/2023 1407   ALT 24 04/11/2023 1407   BILITOT 0.4 04/11/2023 1407       RADIOGRAPHIC STUDIES: No results found.  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  He is status post left laparoscopic radical nephrectomy on 03/12/2022 under the care of Dr. 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 The patient is currently undergoing treatment with Adjuvant immunotherapy with Keytruda 200 Mg IV every 3 weeks status post 15 cycles. He has been tolerating this treatment well with no concerning adverse effects but the start of cycle #16 was delayed by 3 weeks secondary to COVID infection. I recommended for him to proceed with cycle #16 today as planned. I will arrange for the patient to come back for follow-up visit in 3 weeks for evaluation before starting the last cycle of his treatment. For the hypothyroidism, he will continue his current treatment with levothyroxine. He  was advised to call immediately if he has any other concerning symptoms in the interval. The patient voices understanding of current disease status and treatment options and is in agreement with the current care plan.  All questions were answered. The patient knows to call the clinic with any problems, questions or concerns. We can certainly see the patient much sooner if necessary.  The total time spent in the appointment was 20 minutes.  Disclaimer: This note was dictated with voice recognition software. Similar sounding words can inadvertently be transcribed and may not be corrected upon review.

## 2023-05-23 NOTE — Patient Instructions (Signed)
Russell Burns  Discharge Instructions: Thank you for choosing [facility] to provide your oncology and hematology care.   If you have a lab appointment with the [facility], please go directly to the [facility] and check in at the registration area.   Wear comfortable clothing and clothing appropriate for easy access to any Portacath or PICC line.   We strive to give you quality time with your provider. You may need to reschedule your appointment if you arrive late (15 or more minutes).  Arriving late affects you and other patients whose appointments are after yours.  Also, if you miss three or more appointments without notifying the office, you may be dismissed from the clinic at the provider's discretion.      For prescription refill requests, have your pharmacy contact our office and allow 72 hours for refills to be completed.    Today you received the following chemotherapy and/or immunotherapy agents: Keytruda      To help prevent nausea and vomiting after your treatment, we encourage you to take your nausea medication as directed.  BELOW ARE SYMPTOMS THAT SHOULD BE REPORTED IMMEDIATELY: *FEVER GREATER THAN 100.4 F (38 C) OR HIGHER *CHILLS OR SWEATING *NAUSEA AND VOMITING THAT IS NOT CONTROLLED WITH YOUR NAUSEA MEDICATION *UNUSUAL SHORTNESS OF BREATH *UNUSUAL BRUISING OR BLEEDING *URINARY PROBLEMS (pain or burning when urinating, or frequent urination) *BOWEL PROBLEMS (unusual diarrhea, constipation, pain near the anus) TENDERNESS IN MOUTH AND THROAT WITH OR WITHOUT PRESENCE OF ULCERS (sore throat, sores in mouth, or a toothache) UNUSUAL RASH, SWELLING OR PAIN  UNUSUAL VAGINAL DISCHARGE OR ITCHING   Items with * indicate a potential emergency and should be followed up as soon as possible or go to the Emergency Department if any problems should occur.  Please show the CHEMOTHERAPY ALERT CARD or IMMUNOTHERAPY ALERT CARD at  check-in to the Emergency Department and triage nurse.  Should you have questions after your visit or need to cancel or reschedule your appointment, please contact [facility]  Dept: [phone]  and follow the prompts.  Office hours are 8:00 a.m. to 4:30 p.m. Monday - Friday. Please note that voicemails left after 4:00 p.m. may not be returned until the following business day.  We are closed weekends and major holidays. You have access to a nurse at all times for urgent questions. Please call the main number to the clinic Dept: [phone] and follow the prompts.   For any non-urgent questions, you may also contact your provider using MyChart. We now offer e-Visits for anyone 14 and older to request care online for non-urgent symptoms. For details visit [email or web address].   Also download the MyChart app! Go to the app store, search "MyChart", open the app, select [facility], and log in with your MyChart username and password.

## 2023-05-28 ENCOUNTER — Other Ambulatory Visit: Payer: Self-pay | Admitting: Physician Assistant

## 2023-05-29 DIAGNOSIS — I10 Essential (primary) hypertension: Secondary | ICD-10-CM | POA: Diagnosis not present

## 2023-05-29 DIAGNOSIS — G4733 Obstructive sleep apnea (adult) (pediatric): Secondary | ICD-10-CM | POA: Diagnosis not present

## 2023-06-07 ENCOUNTER — Other Ambulatory Visit: Payer: Self-pay | Admitting: Internal Medicine

## 2023-06-07 DIAGNOSIS — R053 Chronic cough: Secondary | ICD-10-CM

## 2023-06-07 DIAGNOSIS — R0981 Nasal congestion: Secondary | ICD-10-CM | POA: Diagnosis not present

## 2023-06-11 ENCOUNTER — Other Ambulatory Visit: Payer: Self-pay

## 2023-06-11 DIAGNOSIS — C642 Malignant neoplasm of left kidney, except renal pelvis: Secondary | ICD-10-CM

## 2023-06-12 DIAGNOSIS — I10 Essential (primary) hypertension: Secondary | ICD-10-CM | POA: Diagnosis not present

## 2023-06-12 DIAGNOSIS — G4733 Obstructive sleep apnea (adult) (pediatric): Secondary | ICD-10-CM | POA: Diagnosis not present

## 2023-06-13 ENCOUNTER — Inpatient Hospital Stay: Payer: BC Managed Care – PPO

## 2023-06-13 ENCOUNTER — Inpatient Hospital Stay: Payer: BC Managed Care – PPO | Attending: Oncology

## 2023-06-13 ENCOUNTER — Inpatient Hospital Stay (HOSPITAL_BASED_OUTPATIENT_CLINIC_OR_DEPARTMENT_OTHER): Payer: BC Managed Care – PPO | Admitting: Internal Medicine

## 2023-06-13 VITALS — BP 143/93 | HR 73 | Temp 97.8°F | Resp 18 | Ht 70.0 in | Wt 235.9 lb

## 2023-06-13 DIAGNOSIS — E039 Hypothyroidism, unspecified: Secondary | ICD-10-CM | POA: Insufficient documentation

## 2023-06-13 DIAGNOSIS — C642 Malignant neoplasm of left kidney, except renal pelvis: Secondary | ICD-10-CM | POA: Insufficient documentation

## 2023-06-13 DIAGNOSIS — Z905 Acquired absence of kidney: Secondary | ICD-10-CM | POA: Insufficient documentation

## 2023-06-13 DIAGNOSIS — Z79890 Hormone replacement therapy (postmenopausal): Secondary | ICD-10-CM | POA: Diagnosis not present

## 2023-06-13 DIAGNOSIS — Z5112 Encounter for antineoplastic immunotherapy: Secondary | ICD-10-CM | POA: Diagnosis not present

## 2023-06-13 LAB — CMP (CANCER CENTER ONLY)
ALT: 15 U/L (ref 0–44)
AST: 15 U/L (ref 15–41)
Albumin: 4.3 g/dL (ref 3.5–5.0)
Alkaline Phosphatase: 60 U/L (ref 38–126)
Anion gap: 9 (ref 5–15)
BUN: 11 mg/dL (ref 6–20)
CO2: 24 mmol/L (ref 22–32)
Calcium: 9.1 mg/dL (ref 8.9–10.3)
Chloride: 105 mmol/L (ref 98–111)
Creatinine: 1.16 mg/dL (ref 0.61–1.24)
GFR, Estimated: >60 mL/min (ref >60)
Glucose, Bld: 119 mg/dL — ABNORMAL HIGH (ref 70–99)
Potassium: 4.2 mmol/L (ref 3.5–5.1)
Sodium: 138 mmol/L (ref 135–145)
Total Bilirubin: 0.4 mg/dL (ref 0.3–1.2)
Total Protein: 7 g/dL (ref 6.5–8.1)

## 2023-06-13 LAB — CBC WITH DIFFERENTIAL (CANCER CENTER ONLY)
Abs Immature Granulocytes: 0.02 10*3/uL (ref 0.00–0.07)
Basophils Absolute: 0.2 10*3/uL — ABNORMAL HIGH (ref 0.0–0.1)
Basophils Relative: 2 %
Eosinophils Absolute: 2.7 10*3/uL — ABNORMAL HIGH (ref 0.0–0.5)
Eosinophils Relative: 30 %
HCT: 46.1 % (ref 39.0–52.0)
Hemoglobin: 15.5 g/dL (ref 13.0–17.0)
Immature Granulocytes: 0 %
Lymphocytes Relative: 18 %
Lymphs Abs: 1.6 10*3/uL (ref 0.7–4.0)
MCH: 31.2 pg (ref 26.0–34.0)
MCHC: 33.6 g/dL (ref 30.0–36.0)
MCV: 92.8 fL (ref 80.0–100.0)
Monocytes Absolute: 0.5 10*3/uL (ref 0.1–1.0)
Monocytes Relative: 5 %
Neutro Abs: 4 10*3/uL (ref 1.7–7.7)
Neutrophils Relative %: 45 %
Platelet Count: 235 10*3/uL (ref 150–400)
RBC: 4.97 MIL/uL (ref 4.22–5.81)
RDW: 12.5 % (ref 11.5–15.5)
WBC Count: 8.9 10*3/uL (ref 4.0–10.5)
nRBC: 0 % (ref 0.0–0.2)

## 2023-06-13 MED ORDER — SODIUM CHLORIDE 0.9 % IV SOLN
200.0000 mg | Freq: Once | INTRAVENOUS | Status: AC
  Administered 2023-06-13: 200 mg via INTRAVENOUS
  Filled 2023-06-13: qty 200

## 2023-06-13 MED ORDER — SODIUM CHLORIDE 0.9 % IV SOLN: Freq: Once | INTRAVENOUS | Status: DC

## 2023-06-13 NOTE — Progress Notes (Signed)
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  PRIOR THERAPY: Status post left laparoscopic radical nephrectomy on 03/12/2022 under the care of Dr. 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  CURRENT THERAPY: Adjuvant immunotherapy with Keytruda 200 Mg IV every 3 weeks status post 16 cycles.  INTERVAL HISTORY: Russell Burns 57 y.o. male returns to the clinic today for follow-up visit.  The patient is feeling fine today with no concerning complaints except for postnasal drainage and sinus issues.  He denied having any current chest pain, shortness of breath, cough or hemoptysis.  He has no nausea, vomiting, diarrhea or constipation.  He has no headache or visual changes.  He denied having any fever or chills.  He has been tolerating his treatment with immunotherapy fairly well.  He is expected to get referral from his primary care physician to ENT for evaluation of his sinus issues.  He is here for evaluation before the last cycle of his treatment with immunotherapy.  MEDICAL HISTORY: Past Medical History:  Diagnosis Date   Allergy    Aortic stenosis    Arthritis    Blood transfusion without reported diagnosis    Cancer (HCC)    Chronic kidney disease    Diabetes mellitus without complication (HCC)    FH: thoracic aneurysm    Hyperlipidemia    Hypertension    Obesity     ALLERGIES:  is allergic to bee venom and other.  MEDICATIONS:  Current Outpatient Medications  Medication Sig Dispense Refill   acetaminophen (TYLENOL) 500 MG tablet Take 1,000 mg by mouth in the morning.     albuterol (PROVENTIL) (2.5 MG/3ML) 0.083% nebulizer  solution Take 3 mLs (2.5 mg total) by nebulization every 6 (six) hours as needed for wheezing or shortness of breath. (Patient not taking: Reported on 04/18/2023) 75 mL 12   albuterol (VENTOLIN HFA) 108 (90 Base) MCG/ACT inhaler Inhale 2 puffs into the lungs every 6 (six) hours as needed for wheezing or shortness of breath.     amLODipine (NORVASC) 10 MG tablet Take 10 mg by mouth daily.     atorvastatin (LIPITOR) 80 MG tablet Take 80 mg by mouth daily.     Budeson-Glycopyrrol-Formoterol (BREZTRI AEROSPHERE) 160-9-4.8 MCG/ACT AERO Inhale 2 puffs into the lungs 2 (two) times daily. Take 2 puffs first thing in am and then another 2 puffs about 12 hours later. 10.7 g 11   cetirizine (ZYRTEC) 10 MG tablet Take 10 mg by mouth 2 (two) times daily.     Dupilumab (DUPIXENT) 200 MG/1. SOPN Inject 200 mg into the skin every 14 (fourteen) days. Loading dose received in clinic on 7/25. 6.84 mL 1   EPINEPHrine 0.3 mg/0.3 mL IJ SOAJ injection Inject 0.3 mg into the muscle as needed for anaphylaxis.     ezetimibe (ZETIA) 10 MG tablet Take 10 mg by mouth daily.     famotidine (PEPCID) 20 MG tablet TAKE 1 TABLET BY MOUTH DAILY AFTER SUPPER 90 tablet 0   fluticasone (FLONASE) 50 MCG/ACT nasal spray Place 2 sprays into both nostrils in the morning and at bedtime.     ipratropium-albuterol (DUONEB) 0.5-2.5 (3) MG/3ML SOLN Take  3 mLs by nebulization every 6 (six) hours as needed (sob/wheezing).     JARDIANCE 25 MG TABS tablet Take 25 mg by mouth daily.     levothyroxine (SYNTHROID) 25 MCG tablet Take 1 tablet (25 mcg total) by mouth daily before breakfast. 30 tablet 2   losartan (COZAAR) 100 MG tablet Take 100 mg by mouth daily.     metFORMIN (GLUCOPHAGE) 1000 MG tablet Take 1,000 mg by mouth 2 (two) times daily with a meal.     montelukast (SINGULAIR) 10 MG tablet Take 1 tablet (10 mg total) by mouth at bedtime. 30 tablet 0   ONETOUCH VERIO test strip 1 each by Other route 5 (five) times daily.     pantoprazole  (PROTONIX) 40 MG tablet Take 1 tablet (40 mg total) by mouth 2 (two) times daily. 60 tablet 0   prochlorperazine (COMPAZINE) 10 MG tablet Take 1 tablet (10 mg total) by mouth every 6 (six) hours as needed. (Patient taking differently: Take 10 mg by mouth every 6 (six) hours as needed for nausea or vomiting.) 30 tablet 0   REPATHA 140 MG/ML SOSY Inject 140 mg into the skin every 14 (fourteen) days.     tamsulosin (FLOMAX) 0.4 MG CAPS capsule Take 1 capsule (0.4 mg total) by mouth daily. 14 capsule 0   tirzepatide (MOUNJARO) 10 MG/0.5ML Pen Inject 10 mg into the skin once a week. (Patient taking differently: Inject 10 mg into the skin every Friday.) 6 mL 3   No current facility-administered medications for this visit.    SURGICAL HISTORY:  Past Surgical History:  Procedure Laterality Date   CYSTOSCOPY WITH URETEROSCOPY AND STENT PLACEMENT Left 02/12/2022   Procedure: CYSTOSCOPY WITH LEFT RETROGRADE PYELOGRAM, DIAGNOSTIC URETEROSCOPY AND STENT PLACEMENT;  Surgeon: Crista Elliot, MD;  Location: [facility];  Service: Urology;  Laterality: Left;   LAPAROSCOPIC NEPHRECTOMY, HAND ASSISTED Left 03/12/2022   Procedure: LEFT HAND ASSISTED LAPAROSCOPIC NEPHRECTOMY;  Surgeon: Crista Elliot, MD;  Location: [facility];  Service: Urology;  Laterality: Left;  NEED 2.5 HRS FOR THIS CASE   TONSILLECTOMY     WISDOM TOOTH EXTRACTION      REVIEW OF SYSTEMS:  A comprehensive review of systems was negative except for: Ears, nose, mouth, throat, and face: positive for sinusitis    PHYSICAL EXAMINATION: General appearance: alert, cooperative, and no distress Head: Normocephalic, without obvious abnormality, atraumatic Neck: no adenopathy, no JVD, supple, symmetrical, trachea midline, and thyroid not enlarged, symmetric, no tenderness/mass/nodules Lymph nodes: Cervical, supraclavicular, and axillary nodes normal. Resp: clear to auscultation bilaterally Back: symmetric, no curvature. ROM normal. No CVA  tenderness. Cardio: regular rate and rhythm, S1, S2 normal, no murmur, click, rub or gallop GI: soft, non-tender; bowel sounds normal; no masses,  no organomegaly Extremities: extremities normal, atraumatic, no cyanosis or edema  ECOG PERFORMANCE STATUS: 0 - Asymptomatic  Blood pressure (!) 143/93, pulse 73, temperature 97.8 F (36.6 C), temperature source Oral, resp. rate 18, height 5\' 10"  (1.778 m), weight 235 lb 14.4 oz (107 kg), SpO2 97%.  LABORATORY DATA: Lab Results  Component Value Date   WBC 8.9 06/13/2023   HGB 15.5 06/13/2023   HCT 46.1 06/13/2023   MCV 92.8 06/13/2023   PLT 235 06/13/2023      Chemistry      Component Value Date/Time   NA 139 05/23/2023 1309   NA 139 07/25/2022 0935   K 4.5 05/23/2023 1309   CL 105 05/23/2023 1309   CO2 26 05/23/2023  1309   BUN 17 05/23/2023 1309   BUN 23 07/25/2022 0935   CREATININE 1.41 (H) 05/23/2023 1309      Component Value Date/Time   CALCIUM 9.4 05/23/2023 1309   ALKPHOS 60 05/23/2023 1309   AST 20 05/23/2023 1309   ALT 25 05/23/2023 1309   BILITOT 0.4 05/23/2023 1309       RADIOGRAPHIC STUDIES: No results found.  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  He is status post left laparoscopic radical nephrectomy on 03/12/2022 under the care of Dr. 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 The patient is currently undergoing treatment with Adjuvant immunotherapy with Keytruda 200 Mg IV every 3 weeks status post 16 cycles. The patient has been tolerating this treatment well with no concerning adverse effects. I recommended for him to proceed with cycle #17 today as planned. I will see him back for follow-up visit in 1 months for evaluation with repeat CT  scan of the chest, abdomen and pelvis for restaging of his disease. For the hypothyroidism, he will continue his current treatment with levothyroxine. The patient was advised to call immediately if he has any concerning symptoms in the interval. The patient voices understanding of current disease status and treatment options and is in agreement with the current care plan.  All questions were answered. The patient knows to call the clinic with any problems, questions or concerns. We can certainly see the patient much sooner if necessary.  The total time spent in the appointment was 20 minutes.  Disclaimer: This note was dictated with voice recognition software. Similar sounding words can inadvertently be transcribed and may not be corrected upon review.

## 2023-06-16 ENCOUNTER — Other Ambulatory Visit: Payer: Self-pay | Admitting: Internal Medicine

## 2023-06-16 DIAGNOSIS — R053 Chronic cough: Secondary | ICD-10-CM

## 2023-06-23 ENCOUNTER — Other Ambulatory Visit: Payer: Self-pay | Admitting: Internal Medicine

## 2023-06-26 ENCOUNTER — Other Ambulatory Visit: Payer: Self-pay | Admitting: Internal Medicine

## 2023-06-26 DIAGNOSIS — R053 Chronic cough: Secondary | ICD-10-CM

## 2023-06-28 ENCOUNTER — Inpatient Hospital Stay (HOSPITAL_COMMUNITY)
Admission: EM | Admit: 2023-06-28 | Discharge: 2023-07-02 | DRG: 177 | Disposition: A | Discharge status: Home / Self Care | Payer: BC Managed Care – PPO | Attending: Internal Medicine | Admitting: Internal Medicine

## 2023-06-28 ENCOUNTER — Encounter (HOSPITAL_COMMUNITY): Payer: Self-pay

## 2023-06-28 ENCOUNTER — Other Ambulatory Visit: Payer: Self-pay

## 2023-06-28 ENCOUNTER — Emergency Department (HOSPITAL_COMMUNITY): Payer: BC Managed Care – PPO

## 2023-06-28 DIAGNOSIS — Z7984 Long term (current) use of oral hypoglycemic drugs: Secondary | ICD-10-CM

## 2023-06-28 DIAGNOSIS — Z8249 Family history of ischemic heart disease and other diseases of the circulatory system: Secondary | ICD-10-CM

## 2023-06-28 DIAGNOSIS — E1169 Type 2 diabetes mellitus with other specified complication: Secondary | ICD-10-CM | POA: Diagnosis present

## 2023-06-28 DIAGNOSIS — E66811 Obesity, class 1: Secondary | ICD-10-CM | POA: Diagnosis present

## 2023-06-28 DIAGNOSIS — Z9221 Personal history of antineoplastic chemotherapy: Secondary | ICD-10-CM

## 2023-06-28 DIAGNOSIS — I350 Nonrheumatic aortic (valve) stenosis: Secondary | ICD-10-CM | POA: Diagnosis not present

## 2023-06-28 DIAGNOSIS — R0602 Shortness of breath: Secondary | ICD-10-CM

## 2023-06-28 DIAGNOSIS — Z23 Encounter for immunization: Secondary | ICD-10-CM | POA: Diagnosis not present

## 2023-06-28 DIAGNOSIS — Z7951 Long term (current) use of inhaled steroids: Secondary | ICD-10-CM

## 2023-06-28 DIAGNOSIS — J4489 Other specified chronic obstructive pulmonary disease: Secondary | ICD-10-CM | POA: Diagnosis not present

## 2023-06-28 DIAGNOSIS — I5032 Chronic diastolic (congestive) heart failure: Secondary | ICD-10-CM | POA: Diagnosis present

## 2023-06-28 DIAGNOSIS — Z6832 Body mass index (BMI) 32.0-32.9, adult: Secondary | ICD-10-CM | POA: Diagnosis not present

## 2023-06-28 DIAGNOSIS — D721 Eosinophilia, unspecified: Secondary | ICD-10-CM | POA: Diagnosis present

## 2023-06-28 DIAGNOSIS — U071 COVID-19: Principal | ICD-10-CM | POA: Diagnosis present

## 2023-06-28 DIAGNOSIS — N400 Enlarged prostate without lower urinary tract symptoms: Secondary | ICD-10-CM | POA: Diagnosis present

## 2023-06-28 DIAGNOSIS — J9601 Acute respiratory failure with hypoxia: Secondary | ICD-10-CM | POA: Diagnosis not present

## 2023-06-28 DIAGNOSIS — Z7985 Long-term (current) use of injectable non-insulin antidiabetic drugs: Secondary | ICD-10-CM

## 2023-06-28 DIAGNOSIS — E119 Type 2 diabetes mellitus without complications: Secondary | ICD-10-CM

## 2023-06-28 DIAGNOSIS — E039 Hypothyroidism, unspecified: Secondary | ICD-10-CM | POA: Diagnosis present

## 2023-06-28 DIAGNOSIS — E1165 Type 2 diabetes mellitus with hyperglycemia: Secondary | ICD-10-CM | POA: Diagnosis not present

## 2023-06-28 DIAGNOSIS — I2510 Atherosclerotic heart disease of native coronary artery without angina pectoris: Secondary | ICD-10-CM | POA: Diagnosis present

## 2023-06-28 DIAGNOSIS — Z79890 Hormone replacement therapy (postmenopausal): Secondary | ICD-10-CM | POA: Diagnosis not present

## 2023-06-28 DIAGNOSIS — K219 Gastro-esophageal reflux disease without esophagitis: Secondary | ICD-10-CM | POA: Diagnosis not present

## 2023-06-28 DIAGNOSIS — J984 Other disorders of lung: Secondary | ICD-10-CM

## 2023-06-28 DIAGNOSIS — Z91030 Bee allergy status: Secondary | ICD-10-CM

## 2023-06-28 DIAGNOSIS — R062 Wheezing: Secondary | ICD-10-CM

## 2023-06-28 DIAGNOSIS — Z85528 Personal history of other malignant neoplasm of kidney: Secondary | ICD-10-CM

## 2023-06-28 DIAGNOSIS — Z832 Family history of diseases of the blood and blood-forming organs and certain disorders involving the immune mechanism: Secondary | ICD-10-CM

## 2023-06-28 DIAGNOSIS — J9621 Acute and chronic respiratory failure with hypoxia: Secondary | ICD-10-CM | POA: Diagnosis present

## 2023-06-28 DIAGNOSIS — N1831 Chronic kidney disease, stage 3a: Secondary | ICD-10-CM | POA: Diagnosis present

## 2023-06-28 DIAGNOSIS — Z833 Family history of diabetes mellitus: Secondary | ICD-10-CM

## 2023-06-28 DIAGNOSIS — E1159 Type 2 diabetes mellitus with other circulatory complications: Secondary | ICD-10-CM | POA: Diagnosis not present

## 2023-06-28 DIAGNOSIS — Z79899 Other long term (current) drug therapy: Secondary | ICD-10-CM

## 2023-06-28 DIAGNOSIS — Z8616 Personal history of COVID-19: Secondary | ICD-10-CM

## 2023-06-28 DIAGNOSIS — I152 Hypertension secondary to endocrine disorders: Secondary | ICD-10-CM | POA: Diagnosis present

## 2023-06-28 DIAGNOSIS — E1122 Type 2 diabetes mellitus with diabetic chronic kidney disease: Secondary | ICD-10-CM | POA: Diagnosis present

## 2023-06-28 DIAGNOSIS — Z720 Tobacco use: Secondary | ICD-10-CM

## 2023-06-28 DIAGNOSIS — Z91048 Other nonmedicinal substance allergy status: Secondary | ICD-10-CM

## 2023-06-28 DIAGNOSIS — E785 Hyperlipidemia, unspecified: Secondary | ICD-10-CM | POA: Diagnosis present

## 2023-06-28 DIAGNOSIS — Z905 Acquired absence of kidney: Secondary | ICD-10-CM

## 2023-06-28 LAB — CBC WITH DIFFERENTIAL/PLATELET
Abs Immature Granulocytes: 0.03 10*3/uL (ref 0.00–0.07)
Basophils Absolute: 0.2 10*3/uL — ABNORMAL HIGH (ref 0.0–0.1)
Basophils Relative: 2 %
Eosinophils Absolute: 4.9 10*3/uL — ABNORMAL HIGH (ref 0.0–0.5)
Eosinophils Relative: 36 %
HCT: 45.7 % (ref 39.0–52.0)
Hemoglobin: 15.2 g/dL (ref 13.0–17.0)
Immature Granulocytes: 0 %
Lymphocytes Relative: 14 %
Lymphs Abs: 1.9 10*3/uL (ref 0.7–4.0)
MCH: 31.6 pg (ref 26.0–34.0)
MCHC: 33.3 g/dL (ref 30.0–36.0)
MCV: 95 fL (ref 80.0–100.0)
Monocytes Absolute: 0.8 10*3/uL (ref 0.1–1.0)
Monocytes Relative: 6 %
Neutro Abs: 5.9 10*3/uL (ref 1.7–7.7)
Neutrophils Relative %: 42 %
Platelets: 250 10*3/uL (ref 150–400)
RBC: 4.81 MIL/uL (ref 4.22–5.81)
RDW: 12.7 % (ref 11.5–15.5)
WBC: 13.6 10*3/uL — ABNORMAL HIGH (ref 4.0–10.5)
nRBC: 0 % (ref 0.0–0.2)

## 2023-06-28 LAB — BLOOD GAS, VENOUS
Acid-base deficit: 1.8 mmol/L (ref 0.0–2.0)
Bicarbonate: 23.1 mmol/L (ref 20.0–28.0)
O2 Saturation: 86 %
Patient temperature: 37
pCO2, Ven: 39 mm[Hg] — ABNORMAL LOW (ref 44–60)
pH, Ven: 7.38 (ref 7.25–7.43)
pO2, Ven: 50 mm[Hg] — ABNORMAL HIGH (ref 32–45)

## 2023-06-28 LAB — SARS CORONAVIRUS 2 BY RT PCR: SARS Coronavirus 2 by RT PCR: POSITIVE — AB

## 2023-06-28 LAB — COMPREHENSIVE METABOLIC PANEL
ALT: 19 U/L (ref 0–44)
AST: 26 U/L (ref 15–41)
Albumin: 4.5 g/dL (ref 3.5–5.0)
Alkaline Phosphatase: 54 U/L (ref 38–126)
Anion gap: 14 (ref 5–15)
BUN: 15 mg/dL (ref 6–20)
CO2: 19 mmol/L — ABNORMAL LOW (ref 22–32)
Calcium: 9.2 mg/dL (ref 8.9–10.3)
Chloride: 102 mmol/L (ref 98–111)
Creatinine, Ser: 1.31 mg/dL — ABNORMAL HIGH (ref 0.61–1.24)
GFR, Estimated: >60 mL/min (ref >60)
Glucose, Bld: 117 mg/dL — ABNORMAL HIGH (ref 70–99)
Potassium: 4.3 mmol/L (ref 3.5–5.1)
Sodium: 135 mmol/L (ref 135–145)
Total Bilirubin: 0.9 mg/dL (ref 0.3–1.2)
Total Protein: 7.7 g/dL (ref 6.5–8.1)

## 2023-06-28 LAB — TROPONIN I (HIGH SENSITIVITY)
Troponin I (High Sensitivity): 8 ng/L (ref <18)
Troponin I (High Sensitivity): 6 ng/L (ref <18)

## 2023-06-28 LAB — GLUCOSE, CAPILLARY: Glucose-Capillary: 176 mg/dL — ABNORMAL HIGH (ref 70–99)

## 2023-06-28 LAB — BRAIN NATRIURETIC PEPTIDE: B Natriuretic Peptide: 53.6 pg/mL (ref 0.0–100.0)

## 2023-06-28 MED ORDER — NIRMATRELVIR/RITONAVIR (PAXLOVID)TABLET
3.0000 | ORAL_TABLET | Freq: Two times a day (BID) | ORAL | Status: DC
  Administered 2023-06-28 – 2023-07-02 (×8): 3 via ORAL
  Filled 2023-06-28: qty 30

## 2023-06-28 MED ORDER — METHYLPREDNISOLONE SODIUM SUCC 125 MG IJ SOLR
125.0000 mg | Freq: Once | INTRAMUSCULAR | Status: AC
  Administered 2023-06-28: 125 mg via INTRAVENOUS
  Filled 2023-06-28: qty 2

## 2023-06-28 MED ORDER — LOSARTAN POTASSIUM 50 MG PO TABS
100.0000 mg | ORAL_TABLET | Freq: Every day | ORAL | Status: DC
  Administered 2023-06-29 – 2023-07-02 (×4): 100 mg via ORAL
  Filled 2023-06-28 (×4): qty 2

## 2023-06-28 MED ORDER — EZETIMIBE 10 MG PO TABS
10.0000 mg | ORAL_TABLET | Freq: Every day | ORAL | Status: DC
  Administered 2023-06-28 – 2023-07-01 (×4): 10 mg via ORAL
  Filled 2023-06-28 (×4): qty 1

## 2023-06-28 MED ORDER — ACETAMINOPHEN 650 MG RE SUPP: 650.0000 mg | Freq: Four times a day (QID) | RECTAL | Status: DC | PRN

## 2023-06-28 MED ORDER — ALBUTEROL SULFATE (2.5 MG/3ML) 0.083% IN NEBU
2.5000 mg | INHALATION_SOLUTION | RESPIRATORY_TRACT | Status: DC | PRN
  Administered 2023-06-30: 2.5 mg via RESPIRATORY_TRACT
  Filled 2023-06-28: qty 3

## 2023-06-28 MED ORDER — ATORVASTATIN CALCIUM 40 MG PO TABS
80.0000 mg | ORAL_TABLET | Freq: Every day | ORAL | Status: DC
  Administered 2023-06-28 – 2023-07-01 (×4): 80 mg via ORAL
  Filled 2023-06-28 (×4): qty 2

## 2023-06-28 MED ORDER — MAGNESIUM SULFATE 2 GM/50ML IV SOLN
2.0000 g | Freq: Once | INTRAVENOUS | Status: AC
  Administered 2023-06-28: 2 g via INTRAVENOUS
  Filled 2023-06-28: qty 50

## 2023-06-28 MED ORDER — IPRATROPIUM-ALBUTEROL 0.5-2.5 (3) MG/3ML IN SOLN
3.0000 mL | Freq: Four times a day (QID) | RESPIRATORY_TRACT | Status: DC
  Administered 2023-06-28: 3 mL via RESPIRATORY_TRACT
  Filled 2023-06-28: qty 3

## 2023-06-28 MED ORDER — AMLODIPINE BESYLATE 10 MG PO TABS
10.0000 mg | ORAL_TABLET | Freq: Every day | ORAL | Status: DC
  Administered 2023-06-29 – 2023-07-02 (×4): 10 mg via ORAL
  Filled 2023-06-28 (×4): qty 1

## 2023-06-28 MED ORDER — FAMOTIDINE 20 MG PO TABS
20.0000 mg | ORAL_TABLET | Freq: Every day | ORAL | Status: DC
  Administered 2023-06-28 – 2023-07-01 (×4): 20 mg via ORAL
  Filled 2023-06-28 (×4): qty 1

## 2023-06-28 MED ORDER — TAMSULOSIN HCL 0.4 MG PO CAPS
0.4000 mg | ORAL_CAPSULE | Freq: Every day | ORAL | Status: DC
  Administered 2023-06-28 – 2023-07-02 (×5): 0.4 mg via ORAL
  Filled 2023-06-28 (×5): qty 1

## 2023-06-28 MED ORDER — LORATADINE 10 MG PO TABS
10.0000 mg | ORAL_TABLET | Freq: Every day | ORAL | Status: DC
  Administered 2023-06-28 – 2023-07-02 (×5): 10 mg via ORAL
  Filled 2023-06-28 (×5): qty 1

## 2023-06-28 MED ORDER — INSULIN ASPART 100 UNIT/ML IJ SOLN
0.0000 [IU] | Freq: Three times a day (TID) | INTRAMUSCULAR | Status: DC
  Administered 2023-06-29 (×2): 4 [IU] via SUBCUTANEOUS
  Administered 2023-06-29: 7 [IU] via SUBCUTANEOUS
  Administered 2023-06-30 (×2): 4 [IU] via SUBCUTANEOUS
  Administered 2023-06-30: 11 [IU] via SUBCUTANEOUS
  Administered 2023-07-01: 7 [IU] via SUBCUTANEOUS
  Administered 2023-07-01 (×2): 4 [IU] via SUBCUTANEOUS
  Administered 2023-07-02: 7 [IU] via SUBCUTANEOUS

## 2023-06-28 MED ORDER — BENZONATATE 100 MG PO CAPS
200.0000 mg | ORAL_CAPSULE | Freq: Three times a day (TID) | ORAL | Status: DC | PRN
  Administered 2023-06-29 – 2023-07-02 (×7): 200 mg via ORAL
  Filled 2023-06-28 (×7): qty 2

## 2023-06-28 MED ORDER — PANTOPRAZOLE SODIUM 40 MG PO TBEC
40.0000 mg | DELAYED_RELEASE_TABLET | Freq: Two times a day (BID) | ORAL | Status: DC
  Administered 2023-06-28 – 2023-07-02 (×8): 40 mg via ORAL
  Filled 2023-06-28 (×8): qty 1

## 2023-06-28 MED ORDER — ACETAMINOPHEN 325 MG PO TABS: 650.0000 mg | ORAL_TABLET | Freq: Four times a day (QID) | ORAL | Status: DC | PRN

## 2023-06-28 MED ORDER — IPRATROPIUM-ALBUTEROL 0.5-2.5 (3) MG/3ML IN SOLN
3.0000 mL | RESPIRATORY_TRACT | Status: AC
  Administered 2023-06-28 (×3): 3 mL via RESPIRATORY_TRACT
  Filled 2023-06-28: qty 6
  Filled 2023-06-28: qty 3

## 2023-06-28 MED ORDER — ONDANSETRON HCL 4 MG/2ML IJ SOLN: 4.0000 mg | Freq: Four times a day (QID) | INTRAMUSCULAR | Status: DC | PRN

## 2023-06-28 MED ORDER — EMPAGLIFLOZIN 25 MG PO TABS
25.0000 mg | ORAL_TABLET | Freq: Every day | ORAL | Status: DC
  Administered 2023-06-29 – 2023-07-02 (×4): 25 mg via ORAL
  Filled 2023-06-28 (×4): qty 1

## 2023-06-28 MED ORDER — INFLUENZA VIRUS VACC SPLIT PF (FLUZONE) 0.5 ML IM SUSY
0.5000 mL | PREFILLED_SYRINGE | INTRAMUSCULAR | Status: AC
  Administered 2023-06-29: 0.5 mL via INTRAMUSCULAR
  Filled 2023-06-28: qty 0.5

## 2023-06-28 MED ORDER — DEXAMETHASONE SODIUM PHOSPHATE 10 MG/ML IJ SOLN
6.0000 mg | INTRAMUSCULAR | Status: DC
  Administered 2023-06-29 – 2023-07-02 (×4): 6 mg via INTRAVENOUS
  Filled 2023-06-28 (×4): qty 1

## 2023-06-28 MED ORDER — METFORMIN HCL 500 MG PO TABS
1000.0000 mg | ORAL_TABLET | Freq: Two times a day (BID) | ORAL | Status: DC
  Administered 2023-06-28: 1000 mg via ORAL
  Filled 2023-06-28 (×2): qty 2

## 2023-06-28 MED ORDER — ENOXAPARIN SODIUM 40 MG/0.4ML IJ SOSY
40.0000 mg | PREFILLED_SYRINGE | INTRAMUSCULAR | Status: DC
  Administered 2023-06-28 – 2023-07-01 (×4): 40 mg via SUBCUTANEOUS
  Filled 2023-06-28 (×4): qty 0.4

## 2023-06-28 MED ORDER — LEVOTHYROXINE SODIUM 25 MCG PO TABS
25.0000 ug | ORAL_TABLET | Freq: Every day | ORAL | Status: DC
  Administered 2023-06-29 – 2023-07-02 (×4): 25 ug via ORAL
  Filled 2023-06-28 (×4): qty 1

## 2023-06-28 MED ORDER — ONDANSETRON HCL 4 MG PO TABS: 4.0000 mg | ORAL_TABLET | Freq: Four times a day (QID) | ORAL | Status: DC | PRN

## 2023-06-28 NOTE — H&P (Signed)
History and Physical    Patient: Russell Burns BJY:[id number] DOB: 06/08/1966 DOA: 06/28/2023 DOS: the patient was seen and examined on 06/28/2023 PCP: [name], MD  Patient coming from: Home  Chief Complaint: Shortness of breath.  HPI: Russell Burns is a 57 y.o. male with medical history significant of seasonal allergies, arctic stenosis, thoracic aneurysm, left renal mass, laparoscopic nephrectomy, chronic kidney disease, type 2 diabetes, hyperlipidemia, hypertension, GERD, class I obesity, occupational lung disease, chronic asthmatic bronchitis, history of COVID-19 in 2020 who is coming to the emergency department with complaints of progressively worse dyspnea associated with URI symptoms for the past 2 days. He denied fever, chills, rhinorrhea, sore throat, wheezing or hemoptysis.  No chest pain, palpitations, diaphoresis, PND, orthopnea or pitting edema of the lower extremities.  No abdominal pain, nausea, emesis, diarrhea, constipation, melena or hematochezia.  No flank pain, dysuria, frequency or hematuria.  No polyuria, polydipsia, polyphagia or blurred vision.   Lab work: CBC showed white count 13.6 with 42% neutrophils, 14% lymphocytes and 36% eosinophils.  Troponin x 2 and BNP normal.  Venous blood gas showed pCO2 decreased at 39 and pO2 increased to 50 mmHg but was otherwise normal.  CMP showed CO2 of 19 mmol/L, glucose on 117 and creatinine 1.31 mg/dL.  The rest of the CMP measurements were normal.  Imaging: Portable 1 view chest radiograph with normal heart size and mediastinal contours.  No active disease.  Both lungs are clear.  ED course: Initial vital signs were temperature 98.4 F, pulse 93, respiration 23, BP 132/85 mmHg O2 sat 94% on room air.  The patient O2 sat decreased to 86% while in the emergency department requiring 2 L of oxygen via nasal cannula.  He received 3 DuoNeb's and 125 mg of methylprednisolone.   Review of Systems: As mentioned in the history of  present illness. All other systems reviewed and are negative. Past Medical History:  Diagnosis Date   Allergy    Aortic stenosis    Arthritis    Blood transfusion without reported diagnosis    Cancer (HCC)    Chronic kidney disease    Diabetes mellitus without complication (HCC)    FH: thoracic aneurysm    Hyperlipidemia    Hypertension    Obesity    Past Surgical History:  Procedure Laterality Date   CYSTOSCOPY WITH URETEROSCOPY AND STENT PLACEMENT Left 02/12/2022   Procedure: CYSTOSCOPY WITH LEFT RETROGRADE PYELOGRAM, DIAGNOSTIC URETEROSCOPY AND STENT PLACEMENT;  Surgeon: Crista Elliot, MD;  Location: [facility];  Service: Urology;  Laterality: Left;   LAPAROSCOPIC NEPHRECTOMY, HAND ASSISTED Left 03/12/2022   Procedure: LEFT HAND ASSISTED LAPAROSCOPIC NEPHRECTOMY;  Surgeon: Crista Elliot, MD;  Location: [facility];  Service: Urology;  Laterality: Left;  NEED 2.5 HRS FOR THIS CASE   TONSILLECTOMY     WISDOM TOOTH EXTRACTION     Social History:  reports that he has never smoked. His smokeless tobacco use includes chew. He reports current alcohol use. He reports that he does not use drugs.  Allergies  Allergen Reactions   Bee Venom Anaphylaxis   Other Itching, Swelling and Other (See Comments)    Feline dander- Runny nose, itchy/puffy eyes    Family History  Problem Relation Age of Onset   CAD Other    Diabetes Mellitus II Other    Hypertension Other    Heart failure Other    Hypertension Mother    Heart attack Father  39, 48 at both ages   Diabetes Mellitus II Father        insulin dependent   Aneurysm Maternal Uncle        aortic   Heart attack Paternal Uncle 65       minor   Hypertension Paternal Uncle    Heart attack Maternal Grandmother 6       massive   Aneurysm Maternal Grandfather        abdominal   Heart attack Paternal Grandfather 25   Aneurysm Cousin 44       aortic   Colon polyps Neg Hx    Esophageal cancer Neg Hx    Rectal cancer Neg Hx     Stomach cancer Neg Hx     Prior to Admission medications   Medication Sig Start Date End Date Taking? Authorizing Provider  acetaminophen (TYLENOL) 500 MG tablet Take 1,000 mg by mouth in the morning.    [provider]  albuterol (PROVENTIL) (2.5 MG/3ML) 0.083% nebulizer solution Take 3 mLs (2.5 mg total) by nebulization every 6 (six) hours as needed for wheezing or shortness of breath. Patient not taking: Reported on 04/18/2023 09/03/22   Noemi Chapel, NP  albuterol (VENTOLIN HFA) 108 (90 Base) MCG/ACT inhaler Inhale 2 puffs into the lungs every 6 (six) hours as needed for wheezing or shortness of breath. 12/11/21   [provider]  amLODipine (NORVASC) 10 MG tablet Take 10 mg by mouth daily. 08/01/20   [provider]  atorvastatin (LIPITOR) 80 MG tablet Take 80 mg by mouth daily.    [provider]  Budeson-Glycopyrrol-Formoterol (BREZTRI AEROSPHERE) 160-9-4.8 MCG/ACT AERO Inhale 2 puffs into the lungs 2 (two) times daily. Take 2 puffs first thing in am and then another 2 puffs about 12 hours later. 12/12/22   Nyoka Cowden, MD  cetirizine (ZYRTEC) 10 MG tablet Take 10 mg by mouth 2 (two) times daily.    [provider]  Dupilumab (DUPIXENT) 200 MG/1. SOPN Inject 200 mg into the skin every 14 (fourteen) days. Loading dose received in clinic on 7/25. 04/18/23   Icard, Elige Radon L, DO  EPINEPHrine 0.3 mg/0.3 mL IJ SOAJ injection Inject 0.3 mg into the muscle as needed for anaphylaxis.    [provider]  ezetimibe (ZETIA) 10 MG tablet Take 10 mg by mouth daily. 08/01/20   [provider]  famotidine (PEPCID) 20 MG tablet TAKE 1 TABLET BY MOUTH EVERY DAY AFTER SUPPER 06/26/23   Nyoka Cowden, MD  fluticasone [facility]) 50 MCG/ACT nasal spray Place 2 sprays into both nostrils in the morning and at bedtime.    [provider]  ipratropium-albuterol (DUONEB) 0.5-2.5 (3) MG/3ML SOLN Take 3 mLs by nebulization every 6 (six)  hours as needed (sob/wheezing). 11/22/22   [provider]  JARDIANCE 25 MG TABS tablet Take 25 mg by mouth daily. 07/10/20   [provider]  levothyroxine (SYNTHROID) 25 MCG tablet Take 1 tablet (25 mcg total) by mouth daily before breakfast. 12/07/22   Heilingoetter, Cassandra L, PA-C  losartan (COZAAR) 100 MG tablet Take 100 mg by mouth daily.    [provider]  metFORMIN (GLUCOPHAGE) 1000 MG tablet Take 1,000 mg by mouth 2 (two) times daily with a meal.    [provider]  montelukast (SINGULAIR) 10 MG tablet Take 1 tablet (10 mg total) by mouth at bedtime. 02/20/23   Azucena Fallen, MD  [facility] VERIO test strip 1 each by Other route 5 (  five) times daily. 10/31/22   [provider]  pantoprazole (PROTONIX) 40 MG tablet Take 1 tablet (40 mg total) by mouth 2 (two) times daily. 02/20/23   Azucena Fallen, MD  prochlorperazine (COMPAZINE) 10 MG tablet Take 1 tablet (10 mg total) by mouth every 6 (six) hours as needed. Patient taking differently: Take 10 mg by mouth every 6 (six) hours as needed for nausea or vomiting. 12/27/22   Si Gaul, MD  REPATHA 140 MG/ML SOSY Inject 140 mg into the skin every 14 (fourteen) days.    [provider]  tamsulosin (FLOMAX) 0.4 MG CAPS capsule Take 1 capsule (0.4 mg total) by mouth daily. 02/12/22   Crista Elliot, MD  tirzepatide [facility]) 10 MG/0.5ML Pen Inject 10 mg into the skin once a week. Patient taking differently: Inject 10 mg into the skin every Friday. 11/14/21       Physical Exam: Vitals:   06/28/23 0856 06/28/23 0858 06/28/23 0957  BP: 132/85    Pulse: 93    Resp: (!) 23    Temp: 98.4 F (36.9 C)    TempSrc: Oral    SpO2: 94%  (!) 86%  Weight:  103.4 kg   Height:  5\' 10"  (1.778 m)    Physical Exam Vitals and nursing note reviewed.  Constitutional:      General: He is awake. He is not in acute distress.    Appearance: He is obese. He is ill-appearing.      Interventions: Nasal cannula in place.  HENT:     Head: Normocephalic.     Nose: No rhinorrhea.     Mouth/Throat:     Mouth: Mucous membranes are moist.  Eyes:     General: No scleral icterus.    Pupils: Pupils are equal, round, and reactive to light.  Neck:     Vascular: No JVD.  Cardiovascular:     Rate and Rhythm: Normal rate and regular rhythm.     Heart sounds: S1 normal and S2 normal.  Pulmonary:     Effort: Tachypnea present. No accessory muscle usage or respiratory distress.     Breath sounds: Decreased breath sounds, wheezing and rhonchi present. No rales.  Abdominal:     General: Bowel sounds are normal.     Palpations: Abdomen is soft.     Tenderness: There is no abdominal tenderness.  Musculoskeletal:     Cervical back: Neck supple.     Right lower leg: No edema.     Left lower leg: No edema.  Skin:    General: Skin is warm and dry.  Neurological:     General: No focal deficit present.     Mental Status: He is alert and oriented to person, place, and time.  Psychiatric:        Mood and Affect: Mood normal.        Behavior: Behavior normal. Behavior is cooperative.   Data Reviewed:  Results are pending, will review when available.  02/20/2023 transthoracic echocardiogram IMPRESSIONS:   1. Left ventricular ejection fraction, by estimation, is 60 to 65%. The  left ventricle has normal function. Left ventricular endocardial border  not optimally defined to evaluate regional wall motion. Left ventricular  diastolic parameters are consistent  with Grade I diastolic dysfunction (impaired relaxation).   2. Right ventricular systolic function is normal. The right ventricular  size is normal. Tricuspid regurgitation signal is inadequate for assessing  PA pressure.   3. The mitral valve is grossly normal.  No evidence of mitral valve  regurgitation. No evidence of mitral stenosis.   4. DVI 0.29; Suspect Siever's 1 Bicuspid with R-L calcified raphe. The  aortic  valve was not well visualized. Aortic valve regurgitation is not  visualized. Moderate to severe aortic valve stenosis. Aortic valve mean  gradient measures 37.0 mmHg. Aortic  valve Vmax measures 3.65 m/s.   Assessment and Plan: Principal Problem:   Acute respiratory failure with hypoxia (HCC) In the setting of:   COVID-19 virus infection  Superimposed on:   Asthmatic bronchitis , chronic (HCC)   Eosinophilia   Occupational lung disease Admit to telemetry/inpatient. Supplemental oxygen as needed. Bronchodilators as needed. Incentive spirometry while awake. Flutter valve exercises. Antitussives as needed. Vitamin C/zinc supplementation Dexamethasone 6 mg IVP daily. Paxlovid 3 tablets p.o. twice daily. Follow-up CBC, CMP and chemistry.  Active Problems:   Type 2 diabetes mellitus (HCC) Carbohydrate modified diet. Continue Jardiance 25 mg p.o. daily. Continue metformin 1000 mg p.o. twice daily. CBG monitoring with RI SS.    Hypertension associated with diabetes (HCC) Continue losartan 100 mg p.o. daily. Continue amlodipine 10 mg p.o. daily.    Hyperlipidemia associated with type 2 diabetes mellitus (HCC) Continue atorvastatin 80 mg p.o. daily.    Chronic diastolic CHF (congestive heart failure) (HCC) No signs of decompensation. Continue losartan as above.    GERD (gastroesophageal reflux disease) Continue pantoprazole 40 mg p.o. twice daily.    Class 1 obesity Current BMI 32.71 kg/m. Lifestyle modifications. Follow-up with closely PCP.    Advance Care Planning:   Code Status: Full Code   Consults:   Family Communication:   Severity of Illness: The appropriate patient status for this patient is INPATIENT. Inpatient status is judged to be reasonable and necessary in order to provide the required intensity of service to ensure the patient's safety. The patient's presenting symptoms, physical exam findings, and initial radiographic and laboratory data in the  context of their chronic comorbidities is felt to place them at high risk for further clinical deterioration. Furthermore, it is not anticipated that the patient will be medically stable for discharge from the hospital within 2 midnights of admission.   * I certify that at the point of admission it is my clinical judgment that the patient will require inpatient hospital care spanning beyond 2 midnights from the point of admission due to high intensity of service, high risk for further deterioration and high frequency of surveillance required.*  Author: Bobette Mo, MD 06/28/2023 10:54 AM  For on call review [email or web address].   This document was prepared using Dragon voice recognition software and may contain some unintended transcription errors.

## 2023-06-28 NOTE — Plan of Care (Signed)
  Problem: Education: Goal: Knowledge of risk factors and measures for prevention of condition will improve Outcome: Progressing   Problem: Coping: Goal: Psychosocial and spiritual needs will be supported Outcome: Progressing   Problem: Respiratory: Goal: Will maintain a patent airway Outcome: Not Progressing Note: New covid diagnosis Goal: Complications related to the disease process, condition or treatment will be avoided or minimized Outcome: Progressing   Problem: Education: Goal: Knowledge of risk factors and measures for prevention of condition will improve Outcome: Progressing   Problem: Coping: Goal: Psychosocial and spiritual needs will be supported Outcome: Progressing   Problem: Respiratory: Goal: Will maintain a patent airway Outcome: Not Progressing Goal: Complications related to the disease process, condition or treatment will be avoided or minimized Outcome: Not Progressing   Problem: Education: Goal: Knowledge of General Education information will improve Description: Including pain rating scale, medication(s)/side effects and non-pharmacologic comfort measures Outcome: Progressing   Problem: Health Behavior/Discharge Planning: Goal: Ability to manage health-related needs will improve Outcome: Progressing   Problem: Clinical Measurements: Goal: Ability to maintain clinical measurements within normal limits will improve Outcome: Progressing Goal: Will remain free from infection Outcome: Progressing Goal: Diagnostic test results will improve Outcome: Progressing Goal: Respiratory complications will improve Outcome: Progressing Goal: Cardiovascular complication will be avoided Outcome: Progressing   Problem: Activity: Goal: Risk for activity intolerance will decrease Outcome: Progressing   Problem: Nutrition: Goal: Adequate nutrition will be maintained Outcome: Progressing   Problem: Coping: Goal: Level of anxiety will decrease Outcome:  Progressing   Problem: Elimination: Goal: Will not experience complications related to bowel motility Outcome: Progressing Goal: Will not experience complications related to urinary retention Outcome: Progressing   Problem: Elimination: Goal: Will not experience complications related to bowel motility Outcome: Progressing Goal: Will not experience complications related to urinary retention Outcome: Progressing   Problem: Pain Managment: Goal: General experience of comfort will improve Outcome: Progressing   Problem: Safety: Goal: Ability to remain free from injury will improve Outcome: Progressing   Problem: Skin Integrity: Goal: Risk for impaired skin integrity will decrease Outcome: Progressing   Problem: Education: Goal: Ability to describe self-care measures that may prevent or decrease complications (Diabetes Survival Skills Education) will improve Outcome: Progressing Goal: Individualized Educational Video(s) Outcome: Progressing   Problem: Fluid Volume: Goal: Ability to maintain a balanced intake and output will improve Outcome: Progressing   Problem: Health Behavior/Discharge Planning: Goal: Ability to identify and utilize available resources and services will improve Outcome: Progressing Goal: Ability to manage health-related needs will improve Outcome: Progressing   Problem: Metabolic: Goal: Ability to maintain appropriate glucose levels will improve Outcome: Progressing   Problem: Nutritional: Goal: Maintenance of adequate nutrition will improve Outcome: Progressing Goal: Progress toward achieving an optimal weight will improve Outcome: Progressing   Problem: Skin Integrity: Goal: Risk for impaired skin integrity will decrease Outcome: Progressing   Problem: Tissue Perfusion: Goal: Adequacy of tissue perfusion will improve Outcome: Progressing

## 2023-06-28 NOTE — ED Provider Notes (Signed)
[place] [facility]ERGENCY DEPARTMENT AT [facility] Provider Note   CSN: [id number] Arrival date & time: 06/28/23  0848     History  Chief Complaint  Patient presents with   Shortness of Breath         Russell Burns is a 57 y.o. 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  Per chart review patient was admitted in May for shortness of breath and hypoxia as well, recurrent episodes.  Thought to be due to aspiration events due to esophageal dysmotility versus chemical pneumonitis, as PFTs were unconvincing, CT negative for PE.  Today, patient reports SOB, chest pressure, wheezing, DOE, and orthopnea developing/worsening over the last few days, now can't catch his breath even at rest. Severe symptoms. No longer improved w/ nebulizer at home. No leg swelling, fevers/chills, productive cough, hemoptysis, radiating pain. Pt used a pulse oximeter at home and noted his oxygen levels would drop to mid 80's.    Past Medical History:  Diagnosis Date   Allergy    Aortic stenosis    Arthritis    Blood transfusion without reported diagnosis    Cancer (HCC)    Chronic kidney disease    Diabetes mellitus without complication (HCC)    FH: thoracic aneurysm    Hyperlipidemia    Hypertension    Obesity        Home Medications Prior to Admission medications   Medication Sig Start Date End Date Taking? Authorizing Provider  acetaminophen (TYLENOL) 500 MG tablet Take 1,000 mg by mouth in the morning.    [provider]  albuterol (PROVENTIL) (2.5 MG/3ML) 0.083% nebulizer solution Take 3 mLs (2.5 mg total) by nebulization every 6 (six) hours as needed for wheezing or  shortness of breath. Patient not taking: Reported on 04/18/2023 09/03/22   Noemi Chapel, NP  albuterol (VENTOLIN HFA) 108 (90 Base) MCG/ACT inhaler Inhale 2 puffs into the lungs every 6 (six) hours as needed for wheezing or shortness of breath. 12/11/21   [provider]  amLODipine (NORVASC) 10 MG tablet Take 10 mg by mouth daily. 08/01/20   [provider]  atorvastatin (LIPITOR) 80 MG tablet Take 80 mg by mouth daily.    [provider]  Budeson-Glycopyrrol-Formoterol (BREZTRI AEROSPHERE) 160-9-4.8 MCG/ACT AERO Inhale 2 puffs into the lungs 2 (two) times daily. Take 2 puffs first thing in am and then another 2 puffs about 12 hours later. 12/12/22   Nyoka Cowden, MD  cetirizine (ZYRTEC) 10 MG tablet Take 10 mg by mouth 2 (two) times daily.    [provider]  Dupilumab (DUPIXENT) 200 MG/1. SOPN Inject 200 mg into the skin every 14 (fourteen) days. Loading dose received in clinic on 7/25. 04/18/23   Icard, Elige Radon L, DO  EPINEPHrine 0.3 mg/0.3 mL IJ SOAJ injection Inject 0.3 mg into the muscle as needed for anaphylaxis.    [provider]  ezetimibe (ZETIA) 10 MG tablet Take 10 mg by mouth daily. 08/01/20   [provider]  famotidine (PEPCID) 20 MG tablet TAKE 1 TABLET BY MOUTH EVERY DAY AFTER SUPPER 06/26/23   Nyoka Cowden, MD  fluticasone [facility]) 50 MCG/ACT nasal spray Place 2 sprays into both nostrils in the morning and at bedtime.  [provider]  ipratropium-albuterol (DUONEB) 0.5-2.5 (3) MG/3ML SOLN Take 3 mLs by nebulization every 6 (six) hours as needed (sob/wheezing). 11/22/22   [provider]  JARDIANCE 25 MG TABS tablet Take 25 mg by mouth daily. 07/10/20   [provider]  levothyroxine (SYNTHROID) 25 MCG tablet Take 1 tablet (25 mcg total) by mouth daily before breakfast. 12/07/22   Heilingoetter, Cassandra L, PA-C  losartan (COZAAR) 100 MG tablet Take 100 mg by mouth daily.    [provider]  metFORMIN (GLUCOPHAGE) 1000 MG tablet Take 1,000 mg by mouth 2 (two) times daily with a meal.    [provider]  montelukast (SINGULAIR) 10 MG tablet Take 1 tablet (10 mg total) by mouth at bedtime. 02/20/23   Azucena Fallen, MD  [facility] VERIO test strip 1 each by Other route 5 (five) times daily. 10/31/22   [provider]  pantoprazole (PROTONIX) 40 MG tablet Take 1 tablet (40 mg total) by mouth 2 (two) times daily. 02/20/23   Azucena Fallen, MD  prochlorperazine (COMPAZINE) 10 MG tablet Take 1 tablet (10 mg total) by mouth every 6 (six) hours as needed. Patient taking differently: Take 10 mg by mouth every 6 (six) hours as needed for nausea or vomiting. 12/27/22   Si Gaul, MD  REPATHA 140 MG/ML SOSY Inject 140 mg into the skin every 14 (fourteen) days.    [provider]  tamsulosin (FLOMAX) 0.4 MG CAPS capsule Take 1 capsule (0.4 mg total) by mouth daily. 02/12/22   Crista Elliot, MD  tirzepatide [facility]) 10 MG/0.5ML Pen Inject 10 mg into the skin once a week. Patient taking differently: Inject 10 mg into the skin every Friday. 11/14/21         Allergies    Bee venom and Other    Review of Systems   Review of Systems A 10 point review of systems was performed and is negative unless otherwise reported in HPI.  Physical Exam Updated Vital Signs BP 132/85   Pulse 93   Temp 98.4 F (36.9 C) (Oral)   Resp (!) 23   Ht 5\' 10"  (1.778 m)   Wt 103.4 kg   SpO2 (!) 86% Comment: pt fell asleep- OSA?- placed on 2 lpm post neb (PT states he does have sleep apnea when he woke for neb- Sp02 immediately increased to 96%- no 02 and no neb at that time).  BMI 32.71 kg/m  Physical Exam General: Normal appearing male in moderate respiratory distress, lying in bed.  HEENT: PERRLA, Sclera anicteric, MMM, trachea midline.  Cardiology: RRR, no murmurs/rubs/gallops.  Resp: Moderate respiratory distress w/ tachypnea into 30s, and expiratory  wheezing bilaterally with tight lung sounds,   Abd: Soft, non-tender, non-distended. No rebound tenderness or guarding.  GU: Deferred. MSK: No peripheral edema or signs of trauma. Skin: warm, dry.  Neuro: A&Ox4, CNs II-XII grossly intact. MAEs. Sensation grossly intact.  Psych: Normal mood and affect.   ED Results / Procedures / Treatments   Labs (all labs ordered are listed, but only abnormal results are displayed) Labs Reviewed  SARS CORONAVIRUS 2 BY RT PCR - Abnormal; Notable for the following components:      Result Value   SARS Coronavirus 2 by RT PCR POSITIVE (*)    All other components within normal limits  CBC WITH DIFFERENTIAL/PLATELET - Abnormal; Notable for the following components:   WBC 13.6 (*)    Eosinophils Absolute 4.9 (*)    Basophils  Absolute 0.2 (*)    All other components within normal limits  BLOOD GAS, VENOUS - Abnormal; Notable for the following components:   pCO2, Ven 39 (*)    pO2, Ven 50 (*)    All other components within normal limits  COMPREHENSIVE METABOLIC PANEL - Abnormal; Notable for the following components:   CO2 19 (*)    Glucose, Bld 117 (*)    Creatinine, Ser 1.31 (*)    All other components within normal limits  BRAIN NATRIURETIC PEPTIDE  TROPONIN I (HIGH SENSITIVITY)  TROPONIN I (HIGH SENSITIVITY)    EKG EKG Interpretation Date/Time:  Friday June 28 2023 08:54:00 EDT Ventricular Rate:  88 PR Interval:  171 QRS Duration:  103 QT Interval:  357 QTC Calculation: 432 R Axis:   -53  Text Interpretation: Sinus rhythm Left anterior fascicular block Confirmed by Vivi Barrack [phone]) on 06/28/2023 10:04:39 AM  Radiology DG Chest Portable 1 View  Result Date: 06/28/2023 CLINICAL DATA:  Shortness of breath EXAM: PORTABLE CHEST 1 VIEW COMPARISON:  Chest x-ray dated November 26, 2022 FINDINGS: The heart size and mediastinal contours are within normal limits. Both lungs are clear. The visualized skeletal structures are unremarkable.  IMPRESSION: No active disease. Electronically Signed   By: Allegra Lai M.D.   On: 06/28/2023 09:25    Procedures Procedures    Medications Ordered in ED Medications  enoxaparin (LOVENOX) injection 40 mg (has no administration in time range)  acetaminophen (TYLENOL) tablet 650 mg (has no administration in time range)    Or  acetaminophen (TYLENOL) suppository 650 mg (has no administration in time range)  ondansetron (ZOFRAN) tablet 4 mg (has no administration in time range)    Or  ondansetron (ZOFRAN) injection 4 mg (has no administration in time range)  ipratropium-albuterol (DUONEB) 0.5-2.5 (3) MG/3ML nebulizer solution 3 mL (3 mLs Nebulization Given 06/28/23 1004)  methylPREDNISolone sodium succinate (SOLU-MEDROL) 125 mg/2 mL injection 125 mg (125 mg Intravenous Given 06/28/23 1914)    ED Course/ Medical Decision Making/ A&P                          Medical Decision Making Amount and/or Complexity of Data Reviewed Labs: ordered. Decision-making details documented in ED Course. Radiology: ordered. Decision-making details documented in ED Course.  Risk Prescription drug management. Decision regarding hospitalization.    This patient presents to the ED for concern of SOB, this involves an extensive number of treatment options, and is a complaint that carries with it a high risk of complications and morbidity.  I considered the following differential and admission for this acute, potentially life threatening condition.   MDM:    DDX for dyspnea includes but is not limited to:  Patient does have moderate respiratory distress and wheezing, consider COPD exacerbation though chart review notes that PFTs were unrevealing in the past and symptoms were thought to be due to recurrent aspiration pneumonitis possibly from esophageal dysmotility.  Patient does report a nonproductive cough and will test for COVID as well or consider other bronchitis or viral upper respiratory infection.   He does endorse some chest pain, consider ACS and EKG does not demonstrate any signs of ischemia or arrhythmia.  Does have aortic stenosis and history, consider valvular heart disease as possible cause.  Reports orthopnea but does not have any crackles and does not appear volume overloaded on exam, lower concern for CHF exacerbation.  Also consider pleural effusion, pneumonia.  Has not been recently  admitted to the hospital and does not have any signs or symptoms of DVT on exam, no tachycardia, and with wheezing, lower concern for PE.  Clinical Course as of 06/28/23 1149  Fri Jun 28, 2023  0949 pCO2, Ven(!): 39 Minimal hyperventilation [HN]  0949 WBC(!): 13.6 +Leukocytosis new from 2 weeks ago [HN]  0950 DG Chest Portable 1 View Neg CXR [HN]  1003 B Natriuretic Peptide: 53.6 Neg [HN]  1004 Troponin I (High Sensitivity): 8 Neg [HN]  1014 SARS Coronavirus 2 by RT PCR(!): POSITIVE +covid [HN]  1050 Pt intermittently desatting into mid 80s, even after duonebs/solumedrol. Will admit to hospitalist. On 2L [place]. [HN]    Clinical Course User Index [HN] Loetta Rough, MD    Labs: I Ordered, and personally interpreted labs.  The pertinent results include:  those listed above  Imaging Studies ordered: I ordered imaging studies including CXR I independently visualized and interpreted imaging. I agree with the radiologist interpretation  Additional history obtained from chart review  Cardiac Monitoring: The patient was maintained on a cardiac monitor.  I personally viewed and interpreted the cardiac monitored which showed an underlying rhythm of: NSR  Reevaluation: After the interventions noted above, I reevaluated the patient and found that they have :improved  Social Determinants of Health: Lives independently  Disposition:  Admit  Co morbidities that complicate the patient evaluation  Past Medical History:  Diagnosis Date   Allergy    Aortic stenosis    Arthritis    Blood  transfusion without reported diagnosis    Cancer (HCC)    Chronic kidney disease    Diabetes mellitus without complication (HCC)    FH: thoracic aneurysm    Hyperlipidemia    Hypertension    Obesity      Medicines Meds ordered this encounter  Medications   ipratropium-albuterol (DUONEB) 0.5-2.5 (3) MG/3ML nebulizer solution 3 mL   methylPREDNISolone sodium succinate (SOLU-MEDROL) 125 mg/2 mL injection 125 mg    IV methylprednisolone will be converted to either a q12h or q24h frequency with the same total daily dose (TDD).  Ordered Dose: 1 to 125 mg TDD; convert to: TDD q24h.  Ordered Dose: 126 to 250 mg TDD; convert to: TDD div q12h.  Ordered Dose: >250 mg TDD; DAW.   enoxaparin (LOVENOX) injection 40 mg   OR Linked Order Group    acetaminophen (TYLENOL) tablet 650 mg    acetaminophen (TYLENOL) suppository 650 mg   OR Linked Order Group    ondansetron (ZOFRAN) tablet 4 mg    ondansetron (ZOFRAN) injection 4 mg    I have reviewed the patients home medicines and have made adjustments as needed  Problem List / ED Course: Problem List Items Addressed This Visit   None Visit Diagnoses     Acute hypoxic respiratory failure (HCC)    -  Primary   Wheezing       COVID-19       SOB (shortness of breath)                       This note was created using dictation software, which may contain spelling or grammatical errors.    Loetta Rough, MD 06/28/23 [phone]

## 2023-06-28 NOTE — ED Notes (Signed)
ED TO INPATIENT HANDOFF REPORT  ED Nurse Name and Phone #: Delice Bison, RN  S Name/Age/Gender Russell Burns 57 y.o. male Room/Bed: WA23/WA23  Code Status   Code Status: Full Code  Home/SNF/Other Home Patient oriented to: self, place, time, and situation Is this baseline? Yes   Triage Complete: Triage complete  Chief Complaint Acute respiratory failure with hypoxia (HCC) [J96.01]  Triage Note Pt began feeling short of breath yesterday around 12. Since then pt has become progressively more SOB, tried using an albuterol inhaler, and breathing treatment with no relief. Pt used a pulse oximeter at home and noted his oxygen levels would drop to mid 80's. Pt endorses a non-productive cough, and the feeling of pressure on his chest. Pt states he has been having issues with his breathing, but is unknown what the cause is. Hx of aortic aneurism, denies any back pain or feelings of something ripping.     Allergies Allergies  Allergen Reactions   Bee Venom Anaphylaxis   Other Itching, Swelling and Other (See Comments)    Feline dander- Runny nose, itchy/puffy eyes    Level of Care/Admitting Diagnosis ED Disposition     ED Disposition  Admit   Condition  --   Comment  Hospital Area: [facility] [[id number]]  Level of Care: Progressive [102]  Admit to Progressive based on following criteria: RESPIRATORY PROBLEMS hypoxemic/hypercapnic respiratory failure that is responsive to NIPPV (BiPAP) or High Flow Nasal Cannula (6-80 lpm). Frequent assessment/intervention, no > Q2 hrs < Q4 hrs, to maintain oxygenation and pulmonary hygiene.  May admit patient to [facility] or [facility] if equivalent level of care is available:: No  Covid Evaluation: Asymptomatic - no recent exposure (last 10 days) testing not required  Diagnosis: Acute respiratory failure with hypoxia [facility]) [[id number]]  Admitting Physician: Bobette Mo [[id number]]  Attending Physician: Bobette Mo  [[id number]]  Certification:: I certify this patient will need inpatient services for at least 2 midnights  Expected Medical Readiness: 06/30/2023          B Medical/Surgery History Past Medical History:  Diagnosis Date   Allergy    Aortic stenosis    Arthritis    Blood transfusion without reported diagnosis    Cancer (HCC)    Chronic kidney disease    Diabetes mellitus without complication (HCC)    FH: thoracic aneurysm    Hyperlipidemia    Hypertension    Obesity    Past Surgical History:  Procedure Laterality Date   CYSTOSCOPY WITH URETEROSCOPY AND STENT PLACEMENT Left 02/12/2022   Procedure: CYSTOSCOPY WITH LEFT RETROGRADE PYELOGRAM, DIAGNOSTIC URETEROSCOPY AND STENT PLACEMENT;  Surgeon: Crista Elliot, MD;  Location: [facility];  Service: Urology;  Laterality: Left;   LAPAROSCOPIC NEPHRECTOMY, HAND ASSISTED Left 03/12/2022   Procedure: LEFT HAND ASSISTED LAPAROSCOPIC NEPHRECTOMY;  Surgeon: Crista Elliot, MD;  Location: [facility];  Service: Urology;  Laterality: Left;  NEED 2.5 HRS FOR THIS CASE   TONSILLECTOMY     WISDOM TOOTH EXTRACTION       A IV Location/Drains/Wounds Patient Lines/Drains/Airways Status     Active Line/Drains/Airways     Name Placement date Placement time Site Days   Peripheral IV 06/28/23 20 G Anterior;Distal;Right Forearm 06/28/23  0915  Forearm  less than 1   Ureteral Drain/Stent Left ureter 6 Fr. 02/12/22  1032  Left ureter  501   Incision - 2 Ports Abdomen Right;Mid Left;Upper 03/12/22  0957  -- 473  Intake/Output Last 24 hours No intake or output data in the 24 hours ending 06/28/23 1536  Labs/Imaging Results for orders placed or performed during the hospital encounter of 06/28/23 (from the past 48 hour(s))  CBC with Differential     Status: Abnormal   Collection Time: 06/28/23  9:02 AM  Result Value Ref Range   WBC 13.6 (H) 4.0 - 10.5 K/uL   RBC 4.81 4.22 - 5.81 MIL/uL   Hemoglobin 15.2 13.0 - 17.0 g/dL   HCT 40.9  81.1 - 91.4 %   MCV 95.0 80.0 - 100.0 fL   MCH 31.6 26.0 - 34.0 pg   MCHC 33.3 30.0 - 36.0 g/dL   RDW 78.2 95.6 - 21.3 %   Platelets 250 150 - 400 K/uL   nRBC 0.0 0.0 - 0.2 %   Neutrophils Relative % 42 %   Neutro Abs 5.9 1.7 - 7.7 K/uL   Lymphocytes Relative 14 %   Lymphs Abs 1.9 0.7 - 4.0 K/uL   Monocytes Relative 6 %   Monocytes Absolute 0.8 0.1 - 1.0 K/uL   Eosinophils Relative 36 %   Eosinophils Absolute 4.9 (H) 0.0 - 0.5 K/uL   Basophils Relative 2 %   Basophils Absolute 0.2 (H) 0.0 - 0.1 K/uL   Immature Granulocytes 0 %   Abs Immature Granulocytes 0.03 0.00 - 0.07 K/uL    Comment: Performed at [facility], [address] [place], [place] [address]  Brain natriuretic peptide     Status: None   Collection Time: 06/28/23  9:02 AM  Result Value Ref Range   B Natriuretic Peptide 53.6 0.0 - 100.0 pg/mL    Comment: Performed at [facility], [address], [place], [place] [address]  Comprehensive metabolic panel     Status: Abnormal   Collection Time: 06/28/23  9:02 AM  Result Value Ref Range   Sodium 135 135 - 145 mmol/L   Potassium 4.3 3.5 - 5.1 mmol/L   Chloride 102 98 - 111 mmol/L   CO2 19 (L) 22 - 32 mmol/L   Glucose, Bld 117 (H) 70 - 99 mg/dL    Comment: Glucose reference range applies only to samples taken after fasting for at least 8 hours.   BUN 15 6 - 20 mg/dL   Creatinine, Ser 2.95 (H) 0.61 - 1.24 mg/dL   Calcium 9.2 8.9 - 28.4 mg/dL   Total Protein 7.7 6.5 - 8.1 g/dL   Albumin 4.5 3.5 - 5.0 g/dL   AST 26 15 - 41 U/L   ALT 19 0 - 44 U/L   Alkaline Phosphatase 54 38 - 126 U/L   Total Bilirubin 0.9 0.3 - 1.2 mg/dL   GFR, Estimated >13 >24 mL/min    Comment: (NOTE) Calculated using the CKD-EPI Creatinine Equation (2021)    Anion gap 14 5 - 15    Comment: Performed at [facility], [address] [place], [place] [address]  Troponin I (High Sensitivity)     Status: None   Collection Time: 06/28/23   9:02 AM  Result Value Ref Range   Troponin I (High Sensitivity) 8 <18 ng/L    Comment: (NOTE) Elevated high sensitivity troponin I (hsTnI) values and significant  changes across serial measurements may suggest ACS but many other  chronic and acute conditions are known to elevate hsTnI results.  Refer to the "Links" section for chest pain algorithms and additional  guidance. Performed at [facility], [address], [place], [place] [address]   SARS Coronavirus 2 by RT PCR (hospital order, performed in [facility] hospital lab) *cepheid single result test* Anterior Nasal Swab     Status: Abnormal   Collection Time: 06/28/23  9:02 AM   Specimen: Anterior Nasal Swab  Result Value Ref Range   SARS Coronavirus 2 by RT PCR POSITIVE (A) NEGATIVE    Comment: (NOTE) SARS-CoV-2 target nucleic acids are DETECTED  SARS-CoV-2 RNA is generally detectable in upper respiratory specimens  during the acute phase of infection.  Positive results are indicative  of the presence of the identified virus, but do not rule out bacterial infection or co-infection with other pathogens not detected by the test.  Clinical correlation with patient history and  other diagnostic information is necessary to determine patient infection status.  The expected result is negative.  Fact Sheet for Patients:   RoadLapTop.co.za   Fact Sheet for Healthcare Providers:   http://kim-Buczynski.com/    This test is not yet approved or cleared by the [place] FDA and  has been authorized for detection and/or diagnosis of SARS-CoV-2 by FDA under an Emergency Use Authorization (EUA).  This EUA will remain in effect (meaning this test can be used) for the duration of  the COVID-19 declaration under Section 564(b)(1)  of the Act, 21 U.S.C. section 360-bbb-3(b)(1), unless the authorization is terminated or revoked sooner.   Performed at [facility], [address] [place], [place] [address]   Blood gas, venous (at [facility] and [facility])     Status: Abnormal   Collection Time: 06/28/23  9:03 AM  Result Value Ref Range   pH, Ven 7.38 7.25 - 7.43   pCO2, Ven 39 (L) 44 - 60 mmHg   pO2, Ven 50 (H) 32 - 45 mmHg   Bicarbonate 23.1 20.0 - 28.0 mmol/L   Acid-base deficit 1.8 0.0 - 2.0 mmol/L   O2 Saturation 86 %   Patient temperature 37.0     Comment: Performed at [facility], [address] [place], [place] [address]  Troponin I (High Sensitivity)     Status: None   Collection Time: 06/28/23 11:30 AM  Result Value Ref Range   Troponin I (High Sensitivity) 6 <18 ng/L    Comment: (NOTE) Elevated high sensitivity troponin I (hsTnI) values and significant  changes across serial measurements may suggest ACS but many other  chronic and acute conditions are known to elevate hsTnI results.  Refer to the "Links" section for chest pain algorithms and additional  guidance. Performed at [facility], [address] [place], [place] [address]    DG Chest Portable 1 View  Result Date: 06/28/2023 CLINICAL DATA:  Shortness of breath EXAM: PORTABLE CHEST 1 VIEW COMPARISON:  Chest x-ray dated November 26, 2022 FINDINGS: The heart size and mediastinal contours are within normal limits. Both lungs are clear. The visualized skeletal structures are unremarkable. IMPRESSION: No active disease. Electronically Signed   By: Allegra Lai M.D.   On: 06/28/2023 09:25    Pending Labs Unresulted Labs (From admission, onward)     Start     Ordered   06/29/23 0500  CBC  Daily,   R      06/28/23 1113   06/29/23 0500  Comprehensive metabolic panel  Daily,   R      06/28/23 1113            Vitals/Pain Today's Vitals   06/28/23 1200 06/28/23 1230  06/28/23 1257 06/28/23 1300  BP: (!) 140/100 (!) 141/83  (!) 147/95  Pulse: 87 90  98  Resp:    18  Temp:   98.3 F (36.8 C)   TempSrc:   Oral   SpO2: 93% 92%  94%   Weight:      Height:      PainSc:        Isolation Precautions No active isolations  Medications Medications  enoxaparin (LOVENOX) injection 40 mg (has no administration in time range)  acetaminophen (TYLENOL) tablet 650 mg (has no administration in time range)    Or  acetaminophen (TYLENOL) suppository 650 mg (has no administration in time range)  ondansetron (ZOFRAN) tablet 4 mg (has no administration in time range)    Or  ondansetron (ZOFRAN) injection 4 mg (has no administration in time range)  ipratropium-albuterol (DUONEB) 0.5-2.5 (3) MG/3ML nebulizer solution 3 mL (3 mLs Nebulization Given 06/28/23 1004)  methylPREDNISolone sodium succinate (SOLU-MEDROL) 125 mg/2 mL injection 125 mg (125 mg Intravenous Given 06/28/23 1610)    Mobility walks     Focused Assessments Pulmonary Assessment Handoff:  Lung sounds: Bilateral Breath Sounds: Clear, Diminished O2 Device: Room Air      R Recommendations: See Admitting Provider Note  Report given to:   Additional Notes:

## 2023-06-28 NOTE — ED Triage Notes (Addendum)
Pt began feeling short of breath yesterday around 12. Since then pt has become progressively more SOB, tried using an albuterol inhaler, and breathing treatment with no relief. Pt used a pulse oximeter at home and noted his oxygen levels would drop to mid 80's. Pt endorses a non-productive cough, and the feeling of pressure on his chest. Pt states he has been having issues with his breathing, but is unknown what the cause is. Hx of aortic aneurism, denies any back pain or feelings of something ripping.

## 2023-06-29 DIAGNOSIS — U071 COVID-19: Secondary | ICD-10-CM | POA: Diagnosis not present

## 2023-06-29 DIAGNOSIS — E1159 Type 2 diabetes mellitus with other circulatory complications: Secondary | ICD-10-CM | POA: Diagnosis not present

## 2023-06-29 DIAGNOSIS — J4489 Other specified chronic obstructive pulmonary disease: Secondary | ICD-10-CM | POA: Diagnosis not present

## 2023-06-29 DIAGNOSIS — J9601 Acute respiratory failure with hypoxia: Secondary | ICD-10-CM | POA: Diagnosis not present

## 2023-06-29 LAB — COMPREHENSIVE METABOLIC PANEL
ALT: 22 U/L (ref 0–44)
AST: 25 U/L (ref 15–41)
Albumin: 4.8 g/dL (ref 3.5–5.0)
Alkaline Phosphatase: 60 U/L (ref 38–126)
Anion gap: 15 (ref 5–15)
BUN: 28 mg/dL — ABNORMAL HIGH (ref 6–20)
CO2: 22 mmol/L (ref 22–32)
Calcium: 9.4 mg/dL (ref 8.9–10.3)
Chloride: 96 mmol/L — ABNORMAL LOW (ref 98–111)
Creatinine, Ser: 1.37 mg/dL — ABNORMAL HIGH (ref 0.61–1.24)
GFR, Estimated: >60 mL/min (ref >60)
Glucose, Bld: 138 mg/dL — ABNORMAL HIGH (ref 70–99)
Potassium: 4.3 mmol/L (ref 3.5–5.1)
Sodium: 133 mmol/L — ABNORMAL LOW (ref 135–145)
Total Bilirubin: 0.4 mg/dL (ref 0.3–1.2)
Total Protein: 8 g/dL (ref 6.5–8.1)

## 2023-06-29 LAB — CBC
HCT: 46.4 % (ref 39.0–52.0)
Hemoglobin: 15.3 g/dL (ref 13.0–17.0)
MCH: 31.3 pg (ref 26.0–34.0)
MCHC: 33 g/dL (ref 30.0–36.0)
MCV: 94.9 fL (ref 80.0–100.0)
Platelets: 283 10*3/uL (ref 150–400)
RBC: 4.89 MIL/uL (ref 4.22–5.81)
RDW: 12.6 % (ref 11.5–15.5)
WBC: 13.2 10*3/uL — ABNORMAL HIGH (ref 4.0–10.5)
nRBC: 0 % (ref 0.0–0.2)

## 2023-06-29 LAB — GLUCOSE, CAPILLARY
Glucose-Capillary: 156 mg/dL — ABNORMAL HIGH (ref 70–99)
Glucose-Capillary: 240 mg/dL — ABNORMAL HIGH (ref 70–99)
Glucose-Capillary: 154 mg/dL — ABNORMAL HIGH (ref 70–99)
Glucose-Capillary: 198 mg/dL — ABNORMAL HIGH (ref 70–99)

## 2023-06-29 MED ORDER — BUDESONIDE 0.25 MG/2ML IN SUSP
0.2500 mg | Freq: Two times a day (BID) | RESPIRATORY_TRACT | Status: DC
  Administered 2023-06-29 – 2023-07-02 (×7): 0.25 mg via RESPIRATORY_TRACT
  Filled 2023-06-29 (×7): qty 2

## 2023-06-29 MED ORDER — REVEFENACIN 175 MCG/3ML IN SOLN
175.0000 ug | Freq: Every day | RESPIRATORY_TRACT | Status: DC
  Administered 2023-06-29 – 2023-07-02 (×4): 175 ug via RESPIRATORY_TRACT
  Filled 2023-06-29 (×4): qty 3

## 2023-06-29 MED ORDER — PHENOL 1.4 % MT LIQD
1.0000 | OROMUCOSAL | Status: DC | PRN
  Administered 2023-06-29 – 2023-06-30 (×3): 1 via OROMUCOSAL
  Filled 2023-06-29: qty 177

## 2023-06-29 MED ORDER — IPRATROPIUM-ALBUTEROL 0.5-2.5 (3) MG/3ML IN SOLN
3.0000 mL | Freq: Two times a day (BID) | RESPIRATORY_TRACT | Status: DC
  Administered 2023-06-29: 3 mL via RESPIRATORY_TRACT
  Filled 2023-06-29: qty 3

## 2023-06-29 MED ORDER — GUAIFENESIN-DM 100-10 MG/5ML PO SYRP
5.0000 mL | ORAL_SOLUTION | ORAL | Status: DC | PRN
  Administered 2023-06-29 – 2023-06-30 (×4): 5 mL via ORAL
  Filled 2023-06-29 (×4): qty 10

## 2023-06-29 NOTE — Progress Notes (Signed)
[facility]                                                                               Russell Burns, is a 57 y.o. male, DOB - [birth year], ZOX:[id number] Admit date - 06/28/2023    Outpatient Primary MD for the patient is [name], MD  LOS - 1  days    Brief summary    57 y.o. male with medical history significant of seasonal allergies, arctic stenosis, thoracic aneurysm, left renal mass, laparoscopic nephrectomy, chronic kidney disease, type 2 diabetes, hyperlipidemia, hypertension, GERD, class I obesity, occupational lung disease, chronic asthmatic bronchitis, history of COVID-19 in 2020 who is coming to the emergency department with complaints of progressively worse dyspnea associated with URI symptoms for the past 2 days.  He was found to have COVID virus infection.   Assessment & Plan    Assessment and Plan:   Acute respiratory failure with hypoxia sec to COVID 19 infection in the setting of chronic asthmatic bronchitis, eosinophilia Continue with IV decadron 5 mg daily, pulmicort and albuterol.  Continue with paxlovid BID.    Type 2 DM:  CBG (last 3)  Recent Labs    06/28/23 2135 06/29/23 0733 06/29/23 1127  GLUCAP 176* 156* 240*   Resume metformin and SSI.    Hypertension;  Well controlled.   GERD Stable.    Chronic asthmatic bronchitis.    CKD: Creatinine at baseline between 1.2 to 1.4.    BPH Continue with flomax.     Estimated body mass index is 32.71 kg/m as calculated from the following:   Height as of this encounter: 5\' 10"  (1.778 m).   Weight as of this encounter: 103.4 kg.  Code Status: full code.  DVT Prophylaxis:  enoxaparin (LOVENOX) injection 40 mg Start: 06/28/23 2200   Level of Care: Level of care: Progressive Family Communication: none at bedside.   Disposition Plan:     Remains inpatient appropriate:    Procedures:  None.   Consultants:   None.   Antimicrobials:   Anti-infectives (From  admission, onward)    Start     Dose/Rate Route Frequency Ordered Stop   06/28/23 2200  nirmatrelvir/ritonavir (PAXLOVID) 3 tablet        3 tablet Oral 2 times daily 06/28/23 1703 07/03/23 2159        Medications  Scheduled Meds:  amLODipine  10 mg Oral Daily   atorvastatin  80 mg Oral Daily   budesonide (PULMICORT) nebulizer solution  0.25 mg Nebulization BID   dexamethasone (DECADRON) injection  6 mg Intravenous Q24H   empagliflozin  25 mg Oral Daily   enoxaparin (LOVENOX) injection  40 mg Subcutaneous Q24H   ezetimibe  10 mg Oral Daily   famotidine  20 mg Oral QPC supper   insulin aspart  0-20 Units Subcutaneous TID WC   levothyroxine  25 mcg Oral QAC breakfast   loratadine  10 mg Oral Daily   losartan  100 mg Oral Daily   metFORMIN  1,000 mg Oral BID WC   nirmatrelvir/ritonavir  3 tablet Oral BID   pantoprazole  40 mg Oral BID   revefenacin  175 mcg Nebulization  Daily   tamsulosin  0.4 mg Oral Daily   Continuous Infusions: PRN Meds:.acetaminophen **OR** acetaminophen, albuterol, benzonatate, guaiFENesin-dextromethorphan, ondansetron **OR** ondansetron (ZOFRAN) IV, phenol    Subjective:   Jakyri Ciliberti was seen and examined today.  Reports feeling tired and catching up on sleep.   Objective:   Vitals:   06/28/23 2219 06/29/23 0050 06/29/23 0448 06/29/23 0900  BP:  135/84 (!) 140/87   Pulse:  85 88   Resp:  20 (!) 22   Temp:  98 F (36.7 C) 98.1 F (36.7 C)   TempSrc:  Oral Oral   SpO2: 99% 93% 96% 95%  Weight:      Height:        Intake/Output Summary (Last 24 hours) at 06/29/2023 1615 Last data filed at 06/29/2023 1447 Gross per 24 hour  Intake 1560 ml  Output --  Net 1560 ml   Filed Weights   06/28/23 0858  Weight: 103.4 kg     Exam General: Alert and oriented x 3, NAD Cardiovascular: S1 S2 auscultated, no murmurs, RRR Respiratory: Clear to auscultation bilaterally, no wheezing, rales or rhonchi Gastrointestinal: Soft, nontender,  nondistended, + bowel sounds Ext: no pedal edema bilaterally Neuro: AAOx3, Cr N's II- XII. Strength 5/5 upper and lower extremities bilaterally Skin: No rashes Psych: Normal affect and demeanor, alert and oriented x3    Data Reviewed:  I have personally reviewed following labs and imaging studies   CBC Lab Results  Component Value Date   WBC 13.2 (H) 06/29/2023   RBC 4.89 06/29/2023   HGB 15.3 06/29/2023   HCT 46.4 06/29/2023   MCV 94.9 06/29/2023   MCH 31.3 06/29/2023   PLT 283 06/29/2023   MCHC 33.0 06/29/2023   RDW 12.6 06/29/2023   LYMPHSABS 1.9 06/28/2023   MONOABS 0.8 06/28/2023   EOSABS 4.9 (H) 06/28/2023   BASOSABS 0.2 (H) 06/28/2023     Last metabolic panel Lab Results  Component Value Date   NA 133 (L) 06/29/2023   K 4.3 06/29/2023   CL 96 (L) 06/29/2023   CO2 22 06/29/2023   BUN 28 (H) 06/29/2023   CREATININE 1.37 (H) 06/29/2023   GLUCOSE 138 (H) 06/29/2023   GFRNONAA >60 06/29/2023   CALCIUM 9.4 06/29/2023   PHOS 4.4 01/04/2022   PROT 8.0 06/29/2023   ALBUMIN 4.8 06/29/2023   BILITOT 0.4 06/29/2023   ALKPHOS 60 06/29/2023   AST 25 06/29/2023   ALT 22 06/29/2023   ANIONGAP 15 06/29/2023    CBG (last 3)  Recent Labs    06/28/23 2135 06/29/23 0733 06/29/23 1127  GLUCAP 176* 156* 240*      Coagulation Profile: No results for input(s): "INR", "PROTIME" in the last 168 hours.   Radiology Studies: DG Chest Portable 1 View  Result Date: 06/28/2023 CLINICAL DATA:  Shortness of breath EXAM: PORTABLE CHEST 1 VIEW COMPARISON:  Chest x-ray dated November 26, 2022 FINDINGS: The heart size and mediastinal contours are within normal limits. Both lungs are clear. The visualized skeletal structures are unremarkable. IMPRESSION: No active disease. Electronically Signed   By: Allegra Lai M.D.   On: 06/28/2023 09:25       Kathlen Mody M.D. [facility] 06/29/2023, 4:15 PM  Available via Epic secure chat 7am-7pm After 7 pm, please refer to  night coverage provider listed on amion.

## 2023-06-30 DIAGNOSIS — J9601 Acute respiratory failure with hypoxia: Secondary | ICD-10-CM | POA: Diagnosis not present

## 2023-06-30 DIAGNOSIS — J4489 Other specified chronic obstructive pulmonary disease: Secondary | ICD-10-CM | POA: Diagnosis not present

## 2023-06-30 DIAGNOSIS — E1159 Type 2 diabetes mellitus with other circulatory complications: Secondary | ICD-10-CM | POA: Diagnosis not present

## 2023-06-30 DIAGNOSIS — U071 COVID-19: Secondary | ICD-10-CM | POA: Diagnosis not present

## 2023-06-30 LAB — CBC
HCT: 47.4 % (ref 39.0–52.0)
Hemoglobin: 15.4 g/dL (ref 13.0–17.0)
MCH: 31.7 pg (ref 26.0–34.0)
MCHC: 32.5 g/dL (ref 30.0–36.0)
MCV: 97.5 fL (ref 80.0–100.0)
Platelets: 231 10*3/uL (ref 150–400)
RBC: 4.86 MIL/uL (ref 4.22–5.81)
RDW: 12.7 % (ref 11.5–15.5)
WBC: 11.3 10*3/uL — ABNORMAL HIGH (ref 4.0–10.5)
nRBC: 0 % (ref 0.0–0.2)

## 2023-06-30 LAB — COMPREHENSIVE METABOLIC PANEL
ALT: 18 U/L (ref 0–44)
AST: 21 U/L (ref 15–41)
Albumin: 4.5 g/dL (ref 3.5–5.0)
Alkaline Phosphatase: 52 U/L (ref 38–126)
Anion gap: 13 (ref 5–15)
BUN: 33 mg/dL — ABNORMAL HIGH (ref 6–20)
CO2: 19 mmol/L — ABNORMAL LOW (ref 22–32)
Calcium: 9 mg/dL (ref 8.9–10.3)
Chloride: 101 mmol/L (ref 98–111)
Creatinine, Ser: 1.52 mg/dL — ABNORMAL HIGH (ref 0.61–1.24)
GFR, Estimated: 53 mL/min — ABNORMAL LOW (ref >60)
Glucose, Bld: 248 mg/dL — ABNORMAL HIGH (ref 70–99)
Potassium: 4.6 mmol/L (ref 3.5–5.1)
Sodium: 133 mmol/L — ABNORMAL LOW (ref 135–145)
Total Bilirubin: 0.3 mg/dL (ref 0.3–1.2)
Total Protein: 7.5 g/dL (ref 6.5–8.1)

## 2023-06-30 LAB — GLUCOSE, CAPILLARY
Glucose-Capillary: 188 mg/dL — ABNORMAL HIGH (ref 70–99)
Glucose-Capillary: 190 mg/dL — ABNORMAL HIGH (ref 70–99)
Glucose-Capillary: 292 mg/dL — ABNORMAL HIGH (ref 70–99)

## 2023-06-30 MED ORDER — LIDOCAINE HCL (PF) 2% IJ FOR NEBU
5.0000 mL | Freq: Once | RESPIRATORY_TRACT | Status: DC
  Filled 2023-06-30: qty 5

## 2023-06-30 MED ORDER — DM-GUAIFENESIN ER 30-600 MG PO TB12
1.0000 | ORAL_TABLET | Freq: Two times a day (BID) | ORAL | Status: DC
  Administered 2023-06-30 – 2023-07-02 (×5): 1 via ORAL
  Filled 2023-06-30 (×5): qty 1

## 2023-06-30 MED ORDER — MENTHOL 3 MG MT LOZG
1.0000 | LOZENGE | OROMUCOSAL | Status: DC | PRN
  Administered 2023-06-30 (×2): 3 mg via ORAL
  Filled 2023-06-30 (×2): qty 9

## 2023-06-30 MED ORDER — HYDROCODONE BIT-HOMATROP MBR 5-1.5 MG/5ML PO SOLN
5.0000 mL | Freq: Four times a day (QID) | ORAL | Status: DC | PRN
  Administered 2023-06-30 – 2023-07-02 (×4): 5 mL via ORAL
  Filled 2023-06-30 (×5): qty 5

## 2023-06-30 NOTE — Progress Notes (Signed)
[facility]                                                                               Russell Burns, is a 57 y.o. male, DOB - 1965-11-12, WGN:[id number] Admit date - 06/28/2023    Outpatient Primary MD for the patient is [name], MD  LOS - 2  days    Brief summary    57 y.o. male with medical history significant of seasonal allergies, arctic stenosis, thoracic aneurysm, left renal mass, laparoscopic nephrectomy, chronic kidney disease, type 2 diabetes, hyperlipidemia, hypertension, GERD, class I obesity, occupational lung disease, chronic asthmatic bronchitis, history of COVID-19 in 2020 who is coming to the emergency department with complaints of progressively worse dyspnea associated with URI symptoms for the past 2 days.  He was found to have COVID virus infection.   Assessment & Plan    Assessment and Plan:   Acute respiratory failure with hypoxia sec to COVID 19 infection in the setting of chronic asthmatic bronchitis, eosinophilia Continue with IV decadron 5 mg daily, pulmicort and albuterol. Currently requiring about 2 lit of [place] oxygen.  Continue with paxlovid BID.    Type 2 DM: get A1c.  CBG (last 3)  Recent Labs    06/29/23 2149 06/30/23 0735 06/30/23 1219  GLUCAP 198* 188* 190*   Resume metformin and SSI.    Hypertension;  Optimal BP parameters.   GERD Stable.    Chronic asthmatic bronchitis.  No wheezing heard.   CKD: Creatinine at baseline between 1.2 to 1.4.  Slight worsening of creatinine to 1.5.    BPH Continue with flomax.   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  S/p left Lap radical nephrectomy on 02/2022 S/p adjuvant immunotherapy with [place].  Follows up with Dr Alvester Morin with Urology and Dr Arbutus Ped.     Estimated body mass index is 32.71 kg/m as calculated from the following:   Height as of this encounter: 5\' 10"  (1.778 m).   Weight as of this encounter:  103.4 kg.  Code Status: full code.  DVT Prophylaxis:  enoxaparin (LOVENOX) injection 40 mg Start: 06/28/23 2200   Level of Care: Level of care: Progressive Family Communication: none at bedside.   Disposition Plan:     Remains inpatient appropriate:  IV steroids.   Procedures:  None.   Consultants:   None.   Antimicrobials:   Anti-infectives (From admission, onward)    Start     Dose/Rate Route Frequency Ordered Stop   06/28/23 2200  nirmatrelvir/ritonavir (PAXLOVID) 3 tablet        3 tablet Oral 2 times daily 06/28/23 1703 07/03/23 2159        Medications  Scheduled Meds:  amLODipine  10 mg Oral Daily   atorvastatin  80 mg Oral Daily   budesonide (PULMICORT) nebulizer solution  0.25 mg Nebulization BID   dexamethasone (DECADRON) injection  6 mg Intravenous Q24H   empagliflozin  25 mg Oral Daily   enoxaparin (LOVENOX) injection  40 mg Subcutaneous Q24H   ezetimibe  10 mg Oral Daily   famotidine  20 mg Oral QPC supper   insulin aspart  0-20 Units Subcutaneous TID WC   levothyroxine  25 mcg Oral QAC breakfast   lidocaine  5 mL Nebulization Once   loratadine  10 mg Oral Daily   losartan  100 mg Oral Daily   nirmatrelvir/ritonavir  3 tablet Oral BID   pantoprazole  40 mg Oral BID   revefenacin  175 mcg Nebulization Daily   tamsulosin  0.4 mg Oral Daily   Continuous Infusions: PRN Meds:.acetaminophen **OR** acetaminophen, albuterol, benzonatate, menthol-cetylpyridinium, ondansetron **OR** ondansetron (ZOFRAN) IV, phenol    Subjective:   Daimien Mcelwain was seen and examined today.  Persistent cough.  On 2 lit of [place] oxygen.   Objective:   Vitals:   06/29/23 2151 06/30/23 0453 06/30/23 0905 06/30/23 1350  BP: 115/85 127/84  122/86  Pulse: 76 74  76  Resp: 19 (!) 22  20  Temp: 97.8 F (36.6 C) 98 F (36.7 C)  98.1 F (36.7 C)  TempSrc: Oral Oral  Oral  SpO2: 93% 96% 96% 95%  Weight:      Height:        Intake/Output Summary (Last 24 hours) at  06/30/2023 1534 Last data filed at 06/30/2023 1000 Gross per 24 hour  Intake 1080 ml  Output --  Net 1080 ml   Filed Weights   06/28/23 0858  Weight: 103.4 kg     Exam General exam: Appears calm and comfortable  Respiratory system: Clear to auscultation. Respiratory effort normal. On 2 lit of [place] oxygen.  Cardiovascular system: S1 & S2 heard, RRR. Gastrointestinal system: Abdomen is nondistended, soft and nontender.  Central nervous system: Alert and oriented. No focal neurological deficits. Extremities: Symmetric 5 x 5 power. Skin: No rashes, lesions or ulcers Psychiatry: Mood & affect appropriate.    Data Reviewed:  I have personally reviewed following labs and imaging studies   CBC Lab Results  Component Value Date   WBC 11.3 (H) 06/30/2023   RBC 4.86 06/30/2023   HGB 15.4 06/30/2023   HCT 47.4 06/30/2023   MCV 97.5 06/30/2023   MCH 31.7 06/30/2023   PLT 231 06/30/2023   MCHC 32.5 06/30/2023   RDW 12.7 06/30/2023   LYMPHSABS 1.9 06/28/2023   MONOABS 0.8 06/28/2023   EOSABS 4.9 (H) 06/28/2023   BASOSABS 0.2 (H) 06/28/2023     Last metabolic panel Lab Results  Component Value Date   NA 133 (L) 06/30/2023   K 4.6 06/30/2023   CL 101 06/30/2023   CO2 19 (L) 06/30/2023   BUN 33 (H) 06/30/2023   CREATININE 1.52 (H) 06/30/2023   GLUCOSE 248 (H) 06/30/2023   GFRNONAA 53 (L) 06/30/2023   CALCIUM 9.0 06/30/2023   PHOS 4.4 01/04/2022   PROT 7.5 06/30/2023   ALBUMIN 4.5 06/30/2023   BILITOT 0.3 06/30/2023   ALKPHOS 52 06/30/2023   AST 21 06/30/2023   ALT 18 06/30/2023   ANIONGAP 13 06/30/2023    CBG (last 3)  Recent Labs    06/29/23 2149 06/30/23 0735 06/30/23 1219  GLUCAP 198* 188* 190*      Coagulation Profile: No results for input(s): "INR", "PROTIME" in the last 168 hours.   Radiology Studies: No results found.     Kathlen Mody M.D. [facility] 06/30/2023, 3:34 PM  Available via Epic secure chat 7am-7pm After 7 pm, please refer  to night coverage provider listed on amion.

## 2023-07-01 DIAGNOSIS — E119 Type 2 diabetes mellitus without complications: Secondary | ICD-10-CM

## 2023-07-01 DIAGNOSIS — J4489 Other specified chronic obstructive pulmonary disease: Secondary | ICD-10-CM | POA: Diagnosis not present

## 2023-07-01 DIAGNOSIS — E1159 Type 2 diabetes mellitus with other circulatory complications: Secondary | ICD-10-CM

## 2023-07-01 DIAGNOSIS — J9601 Acute respiratory failure with hypoxia: Secondary | ICD-10-CM | POA: Diagnosis not present

## 2023-07-01 DIAGNOSIS — K219 Gastro-esophageal reflux disease without esophagitis: Secondary | ICD-10-CM

## 2023-07-01 DIAGNOSIS — U071 COVID-19: Secondary | ICD-10-CM

## 2023-07-01 DIAGNOSIS — I152 Hypertension secondary to endocrine disorders: Secondary | ICD-10-CM

## 2023-07-01 LAB — GLUCOSE, CAPILLARY
Glucose-Capillary: 186 mg/dL — ABNORMAL HIGH (ref 70–99)
Glucose-Capillary: 180 mg/dL — ABNORMAL HIGH (ref 70–99)
Glucose-Capillary: 204 mg/dL — ABNORMAL HIGH (ref 70–99)
Glucose-Capillary: 207 mg/dL — ABNORMAL HIGH (ref 70–99)

## 2023-07-01 MED ORDER — INSULIN ASPART 100 UNIT/ML IJ SOLN
3.0000 [IU] | Freq: Once | INTRAMUSCULAR | Status: AC
  Administered 2023-07-01: 3 [IU] via SUBCUTANEOUS

## 2023-07-01 NOTE — Plan of Care (Signed)
  Problem: Education: Goal: Knowledge of risk factors and measures for prevention of condition will improve Outcome: Progressing   Problem: Respiratory: Goal: Will maintain a patent airway Outcome: Progressing Goal: Complications related to the disease process, condition or treatment will be avoided or minimized Outcome: Progressing   Problem: Education: Goal: Knowledge of risk factors and measures for prevention of condition will improve Outcome: Progressing

## 2023-07-01 NOTE — Plan of Care (Signed)
  Problem: Education: Goal: Knowledge of risk factors and measures for prevention of condition will improve Outcome: Progressing   Problem: Coping: Goal: Psychosocial and spiritual needs will be supported Outcome: Progressing   Problem: Respiratory: Goal: Will maintain a patent airway Outcome: Progressing Goal: Complications related to the disease process, condition or treatment will be avoided or minimized Outcome: Progressing   Problem: Education: Goal: Knowledge of General Education information will improve Description: Including pain rating scale, medication(s)/side effects and non-pharmacologic comfort measures Outcome: Progressing   Problem: Health Behavior/Discharge Planning: Goal: Ability to manage health-related needs will improve Outcome: Progressing   Problem: Clinical Measurements: Goal: Ability to maintain clinical measurements within normal limits will improve Outcome: Progressing Goal: Will remain free from infection Outcome: Progressing Goal: Diagnostic test results will improve Outcome: Progressing Goal: Respiratory complications will improve Outcome: Progressing Goal: Cardiovascular complication will be avoided Outcome: Progressing   Problem: Activity: Goal: Risk for activity intolerance will decrease Outcome: Progressing   Problem: Nutrition: Goal: Adequate nutrition will be maintained Outcome: Progressing   Problem: Coping: Goal: Level of anxiety will decrease Outcome: Progressing   Problem: Elimination: Goal: Will not experience complications related to bowel motility Outcome: Progressing Goal: Will not experience complications related to urinary retention Outcome: Progressing   Problem: Pain Managment: Goal: General experience of comfort will improve Outcome: Progressing   Problem: Safety: Goal: Ability to remain free from injury will improve Outcome: Progressing   Problem: Skin Integrity: Goal: Risk for impaired skin integrity will  decrease Outcome: Progressing   Problem: Education: Goal: Ability to describe self-care measures that may prevent or decrease complications (Diabetes Survival Skills Education) will improve Outcome: Progressing Goal: Individualized Educational Video(s) Outcome: Progressing   Problem: Coping: Goal: Ability to adjust to condition or change in health will improve Outcome: Progressing   Problem: Fluid Volume: Goal: Ability to maintain a balanced intake and output will improve Outcome: Progressing   Problem: Health Behavior/Discharge Planning: Goal: Ability to identify and utilize available resources and services will improve Outcome: Progressing Goal: Ability to manage health-related needs will improve Outcome: Progressing   Problem: Metabolic: Goal: Ability to maintain appropriate glucose levels will improve Outcome: Progressing   Problem: Nutritional: Goal: Maintenance of adequate nutrition will improve Outcome: Progressing Goal: Progress toward achieving an optimal weight will improve Outcome: Progressing   Problem: Skin Integrity: Goal: Risk for impaired skin integrity will decrease Outcome: Progressing   Problem: Tissue Perfusion: Goal: Adequacy of tissue perfusion will improve Outcome: Progressing   Problem: Education: Goal: Knowledge of risk factors and measures for prevention of condition will improve Outcome: Progressing   Problem: Coping: Goal: Psychosocial and spiritual needs will be supported Outcome: Progressing   Problem: Respiratory: Goal: Will maintain a patent airway Outcome: Progressing Goal: Complications related to the disease process, condition or treatment will be avoided or minimized Outcome: Progressing

## 2023-07-01 NOTE — TOC CM/SW Note (Signed)
Transition of Care [facility]) - Inpatient Brief Assessment   Patient Details  Name: Russell Burns MRN: [id number] Date of Birth: 1965-12-17  Transition of [facility]) CM/SW Contact:    Howell Rucks, RN Phone Number: 07/01/2023, 10:57 AM   Clinical Narrative: Call to pt in room, no answer. Note on medical record ok to call spouse. NCM call to pt's spouse Russell Burns) introduced role of TOC/NCM and review for dc planning. Russell Burns reports pt has an established PCP and pharmacy, no current home care services, has home nebulizer and CPAP, no other home DME. Confirmed pt has good support at home, reports transporation available at discharge. TOC Brief Assessment completed. No TOC needs identified at this time.     Transition of Care Asessment: Insurance and Status: Insurance coverage has been reviewed Patient has primary care physician: Yes Home environment has been reviewed: private  residence with spouse Prior level of function:: Independent Prior/Current Home Services: No current home services Social Determinants of Health Reivew: SDOH reviewed no interventions necessary Readmission risk has been reviewed: Yes Transition of care needs: no transition of care needs at this time

## 2023-07-01 NOTE — Progress Notes (Signed)
[facility]                                                                               Russell Burns, is a 57 y.o. male, DOB - 02-14-1966, XLK:[id number] Admit date - 06/28/2023    Outpatient Primary MD for the patient is [name], MD  LOS - 3  days    Brief summary    57 y.o. male with medical history significant of seasonal allergies, arctic stenosis, thoracic aneurysm, left renal mass, laparoscopic nephrectomy, chronic kidney disease, type 2 diabetes, hyperlipidemia, hypertension, GERD, class I obesity, occupational lung disease, chronic asthmatic bronchitis, history of COVID-19 in 2020 who is coming to the emergency department with complaints of progressively worse dyspnea associated with URI symptoms for the past 2 days.  He was found to have COVID virus infection.   Assessment & Plan    Assessment and Plan:   Acute respiratory failure with hypoxia sec to COVID 19 infection in the setting of chronic asthmatic bronchitis, eosinophilia Continue with IV decadron 5 mg daily, pulmicort and albuterol. Currently requiring about 2 lit of [place] oxygen.  Continue with paxlovid BID.  Slowly improving, pt reports his cough has much improved. Possible d.c in the next 24 hours if we can wean him off oxygen.  No fevers or chills.  Repeat labs in am.  Ambulate and check ambulating oxygen levels.    Type 2 DM: get A1c.  CBG (last 3)  Recent Labs    07/01/23 0742 07/01/23 1207 07/01/23 1718  GLUCAP 186* 180* 204*   Resume SSI.    Hypertension;  Well controlled BP parameters.   GERD Stable.    Chronic asthmatic bronchitis.  No wheezing heard.   CKD: Creatinine at baseline between 1.2 to 1.4.  Slight worsening of creatinine to 1.5.    BPH Continue with flomax.   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  S/p left Lap radical nephrectomy on 02/2022 S/p adjuvant immunotherapy with [place].  Follows  up with Dr Alvester Morin with Urology and Dr Arbutus Ped.     Estimated body mass index is 32.71 kg/m as calculated from the following:   Height as of this encounter: 5\' 10"  (1.778 m).   Weight as of this encounter: 103.4 kg.  Code Status: full code.  DVT Prophylaxis:  enoxaparin (LOVENOX) injection 40 mg Start: 06/28/23 2200   Level of Care: Level of care: Progressive Family Communication: none at bedside.   Disposition Plan:     Remains inpatient appropriate:  IV steroids.   Procedures:  None.   Consultants:   None.   Antimicrobials:   Anti-infectives (From admission, onward)    Start     Dose/Rate Route Frequency Ordered Stop   06/28/23 2200  nirmatrelvir/ritonavir (PAXLOVID) 3 tablet        3 tablet Oral 2 times daily 06/28/23 1703 07/03/23 2159        Medications  Scheduled Meds:  amLODipine  10 mg Oral Daily   atorvastatin  80 mg Oral Daily   budesonide (PULMICORT) nebulizer solution  0.25 mg Nebulization BID   dexamethasone (DECADRON) injection  6 mg Intravenous Q24H  dextromethorphan-guaiFENesin  1 tablet Oral BID   empagliflozin  25 mg Oral Daily   enoxaparin (LOVENOX) injection  40 mg Subcutaneous Q24H   ezetimibe  10 mg Oral Daily   famotidine  20 mg Oral QPC supper   insulin aspart  0-20 Units Subcutaneous TID WC   levothyroxine  25 mcg Oral QAC breakfast   lidocaine  5 mL Nebulization Once   loratadine  10 mg Oral Daily   losartan  100 mg Oral Daily   nirmatrelvir/ritonavir  3 tablet Oral BID   pantoprazole  40 mg Oral BID   revefenacin  175 mcg Nebulization Daily   tamsulosin  0.4 mg Oral Daily   Continuous Infusions: PRN Meds:.acetaminophen **OR** acetaminophen, albuterol, benzonatate, HYDROcodone bit-homatropine, menthol-cetylpyridinium, ondansetron **OR** ondansetron (ZOFRAN) IV, phenol    Subjective:   Russell Burns was seen and examined today.   Cough has improved. Reporting having slept good , last night.   Objective:   Vitals:   06/30/23  1350 07/01/23 0600 07/01/23 0850 07/01/23 1209  BP: 122/86 134/89  (!) 134/93  Pulse: 76 68  68  Resp: 20 16    Temp: 98.1 F (36.7 C) 97.9 F (36.6 C)  97.6 F (36.4 C)  TempSrc: Oral Oral  Oral  SpO2: 95% 95% 96% 98%  Weight:      Height:       No intake or output data in the 24 hours ending 07/01/23 1721  Filed Weights   06/28/23 0858  Weight: 103.4 kg     Exam General exam: Appears calm and comfortable  Respiratory system: Clear to auscultation. Respiratory effort normal. On 2 lit of [place] oxygen.  Cardiovascular system: S1 & S2 heard, RRR. No JVD, murmurs, Gastrointestinal system: Abdomen is nondistended, soft and nontender.  Central nervous system: Alert and oriented. No focal neurological deficits. Extremities: Symmetric 5 x 5 power. Skin: No rashes, lesions or ulcers Psychiatry:  Mood & affect appropriate.     Data Reviewed:  I have personally reviewed following labs and imaging studies   CBC Lab Results  Component Value Date   WBC 11.3 (H) 06/30/2023   RBC 4.86 06/30/2023   HGB 15.4 06/30/2023   HCT 47.4 06/30/2023   MCV 97.5 06/30/2023   MCH 31.7 06/30/2023   PLT 231 06/30/2023   MCHC 32.5 06/30/2023   RDW 12.7 06/30/2023   LYMPHSABS 1.9 06/28/2023   MONOABS 0.8 06/28/2023   EOSABS 4.9 (H) 06/28/2023   BASOSABS 0.2 (H) 06/28/2023     Last metabolic panel Lab Results  Component Value Date   NA 133 (L) 06/30/2023   K 4.6 06/30/2023   CL 101 06/30/2023   CO2 19 (L) 06/30/2023   BUN 33 (H) 06/30/2023   CREATININE 1.52 (H) 06/30/2023   GLUCOSE 248 (H) 06/30/2023   GFRNONAA 53 (L) 06/30/2023   CALCIUM 9.0 06/30/2023   PHOS 4.4 01/04/2022   PROT 7.5 06/30/2023   ALBUMIN 4.5 06/30/2023   BILITOT 0.3 06/30/2023   ALKPHOS 52 06/30/2023   AST 21 06/30/2023   ALT 18 06/30/2023   ANIONGAP 13 06/30/2023    CBG (last 3)  Recent Labs    07/01/23 0742 07/01/23 1207 07/01/23 1718  GLUCAP 186* 180* 204*      Coagulation Profile: No results  for input(s): "INR", "PROTIME" in the last 168 hours.   Radiology Studies: No results found.     Kathlen Mody M.D. [facility] 07/01/2023, 5:21 PM  Available via Epic secure chat 7am-7pm After  7 pm, please refer to night coverage provider listed on amion.

## 2023-07-01 NOTE — Progress Notes (Signed)
SATURATION QUALIFICATIONS: (This note is used to comply with regulatory documentation for home oxygen)  Patient Saturations on Room Air at Rest = 95%  Patient Saturations on Room Air while Ambulating = 95-97%  Patient Saturations on 2 Liters of oxygen while Ambulating = 100%  Please briefly explain why patient needs home oxygen: PT does not need O2

## 2023-07-02 DIAGNOSIS — E785 Hyperlipidemia, unspecified: Secondary | ICD-10-CM

## 2023-07-02 DIAGNOSIS — J9601 Acute respiratory failure with hypoxia: Secondary | ICD-10-CM | POA: Diagnosis not present

## 2023-07-02 DIAGNOSIS — K219 Gastro-esophageal reflux disease without esophagitis: Secondary | ICD-10-CM | POA: Diagnosis not present

## 2023-07-02 DIAGNOSIS — E1169 Type 2 diabetes mellitus with other specified complication: Secondary | ICD-10-CM

## 2023-07-02 DIAGNOSIS — E119 Type 2 diabetes mellitus without complications: Secondary | ICD-10-CM | POA: Diagnosis not present

## 2023-07-02 DIAGNOSIS — J4489 Other specified chronic obstructive pulmonary disease: Secondary | ICD-10-CM | POA: Diagnosis not present

## 2023-07-02 LAB — BASIC METABOLIC PANEL
Anion gap: 10 (ref 5–15)
BUN: 40 mg/dL — ABNORMAL HIGH (ref 6–20)
CO2: 24 mmol/L (ref 22–32)
Calcium: 9 mg/dL (ref 8.9–10.3)
Chloride: 99 mmol/L (ref 98–111)
Creatinine, Ser: 1.28 mg/dL — ABNORMAL HIGH (ref 0.61–1.24)
GFR, Estimated: >60 mL/min (ref >60)
Glucose, Bld: 198 mg/dL — ABNORMAL HIGH (ref 70–99)
Potassium: 4.9 mmol/L (ref 3.5–5.1)
Sodium: 133 mmol/L — ABNORMAL LOW (ref 135–145)

## 2023-07-02 LAB — CBC WITH DIFFERENTIAL/PLATELET
Abs Immature Granulocytes: 0.07 10*3/uL (ref 0.00–0.07)
Basophils Absolute: 0 10*3/uL (ref 0.0–0.1)
Basophils Relative: 0 %
Eosinophils Absolute: 0 10*3/uL (ref 0.0–0.5)
Eosinophils Relative: 0 %
HCT: 52.2 % — ABNORMAL HIGH (ref 39.0–52.0)
Hemoglobin: 17.1 g/dL — ABNORMAL HIGH (ref 13.0–17.0)
Immature Granulocytes: 1 %
Lymphocytes Relative: 10 %
Lymphs Abs: 1.2 10*3/uL (ref 0.7–4.0)
MCH: 31.2 pg (ref 26.0–34.0)
MCHC: 32.8 g/dL (ref 30.0–36.0)
MCV: 95.3 fL (ref 80.0–100.0)
Monocytes Absolute: 0.6 10*3/uL (ref 0.1–1.0)
Monocytes Relative: 5 %
Neutro Abs: 9.5 10*3/uL — ABNORMAL HIGH (ref 1.7–7.7)
Neutrophils Relative %: 84 %
Platelets: 265 10*3/uL (ref 150–400)
RBC: 5.48 MIL/uL (ref 4.22–5.81)
RDW: 12.3 % (ref 11.5–15.5)
WBC: 11.4 10*3/uL — ABNORMAL HIGH (ref 4.0–10.5)
nRBC: 0 % (ref 0.0–0.2)

## 2023-07-02 LAB — GLUCOSE, CAPILLARY: Glucose-Capillary: 218 mg/dL — ABNORMAL HIGH (ref 70–99)

## 2023-07-02 MED ORDER — BENZONATATE 200 MG PO CAPS: 200.0000 mg | ORAL_CAPSULE | Freq: Three times a day (TID) | ORAL | 0 refills | Status: AC | PRN

## 2023-07-02 MED ORDER — DEXAMETHASONE 6 MG PO TABS: 6.0000 mg | ORAL_TABLET | Freq: Every day | ORAL | 0 refills | Status: AC

## 2023-07-02 NOTE — Discharge Summary (Signed)
Physician Discharge Summary   Patient: Russell Burns MRN: [id number] DOB: 1965/12/09  Admit date:     06/28/2023  Discharge date: 07/02/23  Discharge Physician: Kathlen Mody   PCP: [name], MD   Recommendations at discharge:  Please follow up with PCP In one week.   Discharge Diagnoses: Principal Problem:   Acute respiratory failure with hypoxia (HCC) Active Problems:   Type 2 diabetes mellitus (HCC)   Hypertension associated with diabetes (HCC)   Hyperlipidemia associated with type 2 diabetes mellitus (HCC)   Chronic diastolic CHF (congestive heart failure) (HCC)   GERD (gastroesophageal reflux disease)   Asthmatic bronchitis , chronic (HCC)   Occupational lung disease   Class 1 obesity   COVID-19 virus infection   Eosinophilia  Resolved Problems:   * No resolved hospital problems. *  Hospital Course: 57 y.o. male with medical history significant of seasonal allergies, arctic stenosis, thoracic aneurysm, left renal mass, laparoscopic nephrectomy, chronic kidney disease, type 2 diabetes, hyperlipidemia, hypertension, GERD, class I obesity, occupational lung disease, chronic asthmatic bronchitis, history of COVID-19 in 2020 who is coming to the emergency department with complaints of progressively worse dyspnea associated with URI symptoms for the past 2 days.  He was found to have COVID virus infection.   Assessment and Plan:  Acute respiratory failure with hypoxia sec to COVID 19 infection in the setting of chronic asthmatic bronchitis, eosinophilia Started on IV decadron and paxlovid .  Ambulating xygen levels done and he was weaned off oxygen.  Cough is better.  Discharge on oral decadron for 5 more days.      Type 2 DM: resume home meds on discharge.   Hypertension;  Well controlled BP parameters.    GERD Stable.      Chronic asthmatic bronchitis.  No wheezing heard.    CKD: Creatinine at baseline between 1.2 to 1.4.  Creatinine is back to baseline  to 1.2.     BPH Continue with flomax.    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  S/p left Lap radical nephrectomy on 02/2022 S/p adjuvant immunotherapy with [place].  Follows up with Dr Alvester Morin with Urology and Dr Arbutus Ped.        Consultants: none.  Procedures performed: none.   Disposition: Home Diet recommendation:  Discharge Diet Orders (From admission, onward)     Start     Ordered   07/02/23 0000  Diet - low sodium heart healthy        07/02/23 1031           Carb modified diet DISCHARGE MEDICATION: Allergies as of 07/02/2023       Reactions   Bee Venom Anaphylaxis   Other Itching, Swelling, Other (See Comments)   Feline dander- Runny nose, itchy/puffy eyes        Medication List     TAKE these medications    acetaminophen 500 MG tablet Commonly known as: TYLENOL Take 1,000 mg by mouth in the morning.   albuterol 108 (90 Base) MCG/ACT inhaler Commonly known as: VENTOLIN HFA Inhale 2 puffs into the lungs every 6 (six) hours as needed for wheezing or shortness of breath.   albuterol (2.5 MG/3ML) 0.083% nebulizer solution Commonly known as: PROVENTIL Take 3 mLs (2.5 mg total) by nebulization every 6 (six) hours as needed for wheezing or shortness of breath.   amLODipine 10 MG tablet Commonly known as: NORVASC Take 10 mg by mouth daily.   atorvastatin 80 MG  tablet Commonly known as: LIPITOR Take 80 mg by mouth daily.   benzonatate 200 MG capsule Commonly known as: TESSALON Take 1 capsule (200 mg total) by mouth 3 (three) times daily as needed.   Breztri Aerosphere 160-9-4.8 MCG/ACT Aero Generic drug: Budeson-Glycopyrrol-Formoterol Inhale 2 puffs into the lungs 2 (two) times daily. Take 2 puffs first thing in am and then another 2 puffs about 12 hours later.   cetirizine 10 MG tablet Commonly known as: ZYRTEC Take 10 mg by mouth 2 (two) times daily.   dexamethasone 6 MG tablet Commonly  known as: DECADRON Take 1 tablet (6 mg total) by mouth daily for 5 days. Start taking on: July 03, 2023   Dupixent 200 MG/1. Sopn Generic drug: Dupilumab Inject 200 mg into the skin every 14 (fourteen) days. Loading dose received in clinic on 7/25.   EPINEPHrine 0.3 mg/0.3 mL Soaj injection Commonly known as: EPI-PEN Inject 0.3 mg into the muscle as needed for anaphylaxis.   ezetimibe 10 MG tablet Commonly known as: ZETIA Take 10 mg by mouth daily.   famotidine 20 MG tablet Commonly known as: PEPCID TAKE 1 TABLET BY MOUTH EVERY DAY AFTER SUPPER   fluticasone 50 MCG/ACT nasal spray Commonly known as: FLONASE Place 2 sprays into both nostrils in the morning and at bedtime.   ipratropium-albuterol 0.5-2.5 (3) MG/3ML Soln Commonly known as: DUONEB Take 3 mLs by nebulization every 6 (six) hours as needed (sob/wheezing).   Jardiance 25 MG Tabs tablet Generic drug: empagliflozin Take 25 mg by mouth daily.   levothyroxine 25 MCG tablet Commonly known as: Synthroid Take 1 tablet (25 mcg total) by mouth daily before breakfast.   losartan 100 MG tablet Commonly known as: COZAAR Take 100 mg by mouth daily.   metFORMIN 1000 MG tablet Commonly known as: GLUCOPHAGE Take 1,000 mg by mouth 2 (two) times daily with a meal.   Mounjaro 10 MG/0.5ML Pen Generic drug: tirzepatide Inject 10 mg into the skin once a week. What changed: when to take this   OneTouch Verio test strip Generic drug: glucose blood 1 each by Other route 5 (five) times daily.   pantoprazole 40 MG tablet Commonly known as: PROTONIX Take 1 tablet (40 mg total) by mouth 2 (two) times daily.   prochlorperazine 10 MG tablet Commonly known as: COMPAZINE Take 1 tablet (10 mg total) by mouth every 6 (six) hours as needed. What changed: reasons to take this   Repatha 140 MG/ML Sosy Generic drug: Evolocumab Inject 140 mg into the skin every 14 (fourteen) days.   tamsulosin 0.4 MG Caps capsule Commonly  known as: FLOMAX Take 1 capsule (0.4 mg total) by mouth daily.        Discharge Exam: Filed Weights   06/28/23 0858  Weight: 103.4 kg   General exam: Appears calm and comfortable  Respiratory system: Clear to auscultation. Respiratory effort normal. Cardiovascular system: S1 & S2 heard, RRR. Gastrointestinal system: Abdomen is nondistended, soft and nontender.  Central nervous system: Alert and oriented. No focal neurological deficits. Extremities: Symmetric 5 x 5 power. Skin: No rashes, lesions or ulcers Psychiatry: JMood & affect appropriate.    Condition at discharge: fair  The results of significant diagnostics from this hospitalization (including imaging, microbiology, ancillary and laboratory) are listed below for reference.   Imaging Studies: DG Chest Portable 1 View  Result Date: 06/28/2023 CLINICAL DATA:  Shortness of breath EXAM: PORTABLE CHEST 1 VIEW COMPARISON:  Chest x-ray dated November 26, 2022 FINDINGS: The heart size  and mediastinal contours are within normal limits. Both lungs are clear. The visualized skeletal structures are unremarkable. IMPRESSION: No active disease. Electronically Signed   By: Allegra Lai M.D.   On: 06/28/2023 09:25    Microbiology: Results for orders placed or performed during the hospital encounter of 06/28/23  SARS Coronavirus 2 by RT PCR (hospital order, performed in [facility] hospital lab) *cepheid single result test* Anterior Nasal Swab     Status: Abnormal   Collection Time: 06/28/23  9:02 AM   Specimen: Anterior Nasal Swab  Result Value Ref Range Status   SARS Coronavirus 2 by RT PCR POSITIVE (A) NEGATIVE Final    Comment: (NOTE) SARS-CoV-2 target nucleic acids are DETECTED  SARS-CoV-2 RNA is generally detectable in upper respiratory specimens  during the acute phase of infection.  Positive results are indicative  of the presence of the identified virus, but do not rule out bacterial infection or co-infection with other  pathogens not detected by the test.  Clinical correlation with patient history and  other diagnostic information is necessary to determine patient infection status.  The expected result is negative.  Fact Sheet for Patients:   RoadLapTop.co.za   Fact Sheet for Healthcare Providers:   http://kim-Hark.com/    This test is not yet approved or cleared by the [place] FDA and  has been authorized for detection and/or diagnosis of SARS-CoV-2 by FDA under an Emergency Use Authorization (EUA).  This EUA will remain in effect (meaning this test can be used) for the duration of  the COVID-19 declaration under Section 564(b)(1)  of the Act, 21 U.S.C. section 360-bbb-3(b)(1), unless the authorization is terminated or revoked sooner.   Performed at [facility] [address], [place], [place] [address]     Labs: CBC: Recent Labs  Lab 06/28/23 0902 06/29/23 0425 06/30/23 0333 07/02/23 0359  WBC 13.6* 13.2* 11.3* 11.4*  NEUTROABS 5.9  --   --  9.5*  HGB 15.2 15.3 15.4 17.1*  HCT 45.7 46.4 47.4 52.2*  MCV 95.0 94.9 97.5 95.3  PLT 250 283 231 265   Basic Metabolic Panel: Recent Labs  Lab 06/28/23 0902 06/29/23 0425 06/30/23 0333 07/02/23 0359  NA 135 133* 133* 133*  K 4.3 4.3 4.6 4.9  CL 102 96* 101 99  CO2 19* 22 19* 24  GLUCOSE 117* 138* 248* 198*  BUN 15 28* 33* 40*  CREATININE 1.31* 1.37* 1.52* 1.28*  CALCIUM 9.2 9.4 9.0 9.0   Liver Function Tests: Recent Labs  Lab 06/28/23 0902 06/29/23 0425 06/30/23 0333  AST 26 25 21   ALT 19 22 18   ALKPHOS 54 60 52  BILITOT 0.9 0.4 0.3  PROT 7.7 8.0 7.5  ALBUMIN 4.5 4.8 4.5   CBG: Recent Labs  Lab 07/01/23 0742 07/01/23 1207 07/01/23 1718 07/01/23 2112 07/02/23 0733  GLUCAP 186* 180* 204* 207* 218*    Discharge time spent: 39 minutes.  Signed: Kathlen Mody, MD [facility] 07/02/2023

## 2023-07-02 NOTE — Progress Notes (Signed)
Patient discharged: Home with family  Via: Wheelchair   Discharge paperwork given: to patient and family  Reviewed with teach back  IV and telemetry disconnected  Belongings given to patient    

## 2023-07-04 ENCOUNTER — Ambulatory Visit (HOSPITAL_COMMUNITY)
Admission: RE | Admit: 2023-07-04 | Discharge: 2023-07-04 | Disposition: A | Discharge status: Home / Self Care | Payer: BC Managed Care – PPO | Source: Ambulatory Visit | Attending: Internal Medicine | Admitting: Internal Medicine

## 2023-07-04 ENCOUNTER — Other Ambulatory Visit: Payer: Self-pay | Admitting: Internal Medicine

## 2023-07-04 DIAGNOSIS — R590 Localized enlarged lymph nodes: Secondary | ICD-10-CM | POA: Diagnosis not present

## 2023-07-04 DIAGNOSIS — C642 Malignant neoplasm of left kidney, except renal pelvis: Secondary | ICD-10-CM

## 2023-07-04 DIAGNOSIS — K5730 Diverticulosis of large intestine without perforation or abscess without bleeding: Secondary | ICD-10-CM | POA: Diagnosis not present

## 2023-07-04 DIAGNOSIS — Z905 Acquired absence of kidney: Secondary | ICD-10-CM | POA: Diagnosis not present

## 2023-07-06 ENCOUNTER — Encounter: Payer: Self-pay | Admitting: Pulmonary Disease

## 2023-07-06 ENCOUNTER — Encounter: Payer: Self-pay | Admitting: Cardiology

## 2023-07-06 ENCOUNTER — Encounter: Payer: Self-pay | Admitting: Internal Medicine

## 2023-07-08 ENCOUNTER — Encounter: Payer: Self-pay | Admitting: Internal Medicine

## 2023-07-10 ENCOUNTER — Other Ambulatory Visit: Payer: Self-pay

## 2023-07-11 DIAGNOSIS — J9601 Acute respiratory failure with hypoxia: Secondary | ICD-10-CM | POA: Diagnosis not present

## 2023-07-11 DIAGNOSIS — Z0000 Encounter for general adult medical examination without abnormal findings: Secondary | ICD-10-CM | POA: Diagnosis not present

## 2023-07-11 DIAGNOSIS — Z1389 Encounter for screening for other disorder: Secondary | ICD-10-CM | POA: Diagnosis not present

## 2023-07-16 DIAGNOSIS — G4733 Obstructive sleep apnea (adult) (pediatric): Secondary | ICD-10-CM | POA: Diagnosis not present

## 2023-07-17 NOTE — Progress Notes (Signed)
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   PRIOR THERAPY:  1) Status post left laparoscopic radical nephrectomy on 03/12/2022 under the care of Dr. 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  2)  Adjuvant immunotherapy with Keytruda 200 Mg IV every 3 weeks status post 17 cycles. Last dose on 06/13/23  CURRENT THERAPY: Observation    INTERVAL HISTORY: Russell Burns 57 y.o. male returns to the clinic today for a follow-up visit.  The patient was last seen in the clinic on 06/13/2023.  He completed his last cycle of adjuvant immunotherapy with Keytruda.  He overall tolerated well without any concerning adverse side effects.  In the interval since he was last seen he did present to the emergency room and was admitted from 10/4 to 07/02/2023 for respiratory failure secondary to hypoxia.  He was found to be positive for COVID-19.  He was admitted for acute respiratory failure secondary to COVID-19 in the setting of chronic asthmatic bronchitis and eosinophilia.  He received IV Decadron and Paxlovid.  Since being discharged, he is feeling significantly better at this time. He states he feels better now than he has this past year. Since being discharged, the patient denies any recent fever, chills, night sweats, or unexplained weight loss. Denies any nausea, vomiting, diarrhea, or constipation. He has not needed his nebulizer or inhalers since leaving the hospital. Denies any current chest pain, cough, or hemoptysis.  Denies any rashes or itching. Denies any new bone pain. Denies any headache or visual changes. Denies any abdominal pain or back pain. Denies any urinary changes. He recently had a restaging  CT scan. He is here for evaluation and repeat blood work.     MEDICAL HISTORY: Past Medical History:  Diagnosis Date   Allergy    Aortic stenosis    Arthritis    Blood transfusion without reported diagnosis    Cancer (HCC)    Chronic kidney disease    Diabetes mellitus without complication (HCC)    FH: thoracic aneurysm    Hyperlipidemia    Hypertension    Obesity     ALLERGIES:  is allergic to bee venom and other.  MEDICATIONS:  Current Outpatient Medications  Medication Sig Dispense Refill   acetaminophen (TYLENOL) 500 MG tablet Take 1,000 mg by mouth in the morning.     albuterol (PROVENTIL) (2.5 MG/3ML) 0.083% nebulizer solution Take 3 mLs (2.5 mg total) by nebulization every 6 (six) hours as needed for wheezing or shortness of breath. 75 mL 12   albuterol (VENTOLIN HFA) 108 (90 Base) MCG/ACT inhaler Inhale 2 puffs into the lungs every 6 (six) hours as needed for wheezing or shortness of breath.     amLODipine (NORVASC) 10 MG tablet Take 10 mg by mouth daily.     atorvastatin (LIPITOR) 80 MG tablet Take 80 mg by mouth daily.     benzonatate (TESSALON) 200 MG capsule Take 1 capsule (200 mg total) by mouth 3 (three) times daily as needed. 20 capsule 0   Budeson-Glycopyrrol-Formoterol (BREZTRI AEROSPHERE) 160-9-4.8 MCG/ACT AERO Inhale 2 puffs into the lungs 2 (two) times daily. Take 2 puffs first thing in am and then another 2 puffs about 12 hours later. 10.7 g 11  cetirizine (ZYRTEC) 10 MG tablet Take 10 mg by mouth 2 (two) times daily.     Dupilumab (DUPIXENT) 200 MG/1. SOPN Inject 200 mg into the skin every 14 (fourteen) days. Loading dose received in clinic on 7/25. 6.84 mL 1   EPINEPHrine 0.3 mg/0.3 mL IJ SOAJ injection Inject 0.3 mg into the muscle as needed for anaphylaxis.     ezetimibe (ZETIA) 10 MG tablet Take 10 mg by mouth daily.     famotidine (PEPCID) 20 MG tablet TAKE 1 TABLET BY MOUTH EVERY DAY AFTER SUPPER 90 tablet 0   fluticasone (FLONASE) 50 MCG/ACT  nasal spray Place 2 sprays into both nostrils in the morning and at bedtime.     ipratropium-albuterol (DUONEB) 0.5-2.5 (3) MG/3ML SOLN Take 3 mLs by nebulization every 6 (six) hours as needed (sob/wheezing).     JARDIANCE 25 MG TABS tablet Take 25 mg by mouth daily.     levothyroxine (SYNTHROID) 25 MCG tablet Take 1 tablet (25 mcg total) by mouth daily before breakfast. 30 tablet 2   losartan (COZAAR) 100 MG tablet Take 100 mg by mouth daily.     metFORMIN (GLUCOPHAGE) 1000 MG tablet Take 1,000 mg by mouth 2 (two) times daily with a meal.     ONETOUCH VERIO test strip 1 each by Other route 5 (five) times daily.     pantoprazole (PROTONIX) 40 MG tablet Take 1 tablet (40 mg total) by mouth 2 (two) times daily. 60 tablet 0   prochlorperazine (COMPAZINE) 10 MG tablet Take 1 tablet (10 mg total) by mouth every 6 (six) hours as needed. (Patient taking differently: Take 10 mg by mouth every 6 (six) hours as needed for nausea or vomiting.) 30 tablet 0   REPATHA 140 MG/ML SOSY Inject 140 mg into the skin every 14 (fourteen) days.     tamsulosin (FLOMAX) 0.4 MG CAPS capsule Take 1 capsule (0.4 mg total) by mouth daily. 14 capsule 0   tirzepatide (MOUNJARO) 10 MG/0.5ML Pen Inject 10 mg into the skin once a week. (Patient taking differently: Inject 10 mg into the skin every Friday.) 6 mL 3   No current facility-administered medications for this visit.    SURGICAL HISTORY:  Past Surgical History:  Procedure Laterality Date   CYSTOSCOPY WITH URETEROSCOPY AND STENT PLACEMENT Left 02/12/2022   Procedure: CYSTOSCOPY WITH LEFT RETROGRADE PYELOGRAM, DIAGNOSTIC URETEROSCOPY AND STENT PLACEMENT;  Surgeon: Crista Elliot, MD;  Location: [facility];  Service: Urology;  Laterality: Left;   LAPAROSCOPIC NEPHRECTOMY, HAND ASSISTED Left 03/12/2022   Procedure: LEFT HAND ASSISTED LAPAROSCOPIC NEPHRECTOMY;  Surgeon: Crista Elliot, MD;  Location: [facility];  Service: Urology;  Laterality: Left;  NEED 2.5 HRS FOR THIS CASE    TONSILLECTOMY     WISDOM TOOTH EXTRACTION      REVIEW OF SYSTEMS:   Review of Systems  Constitutional: Negative for appetite change, chills, fatigue, fever and unexpected weight change.  HENT:   Negative for mouth sores, nosebleeds, sore throat and trouble swallowing.   Eyes: Negative for eye problems and icterus.  Respiratory: Negative for cough, hemoptysis, shortness of breath and wheezing.   Cardiovascular: Negative for chest pain and leg swelling.  Gastrointestinal: Negative for abdominal pain, constipation, diarrhea, nausea and vomiting.  Genitourinary: Negative for bladder incontinence, difficulty urinating, dysuria, frequency and hematuria.   Musculoskeletal: Negative for back pain, gait problem, neck pain and neck stiffness.  Skin: Negative for itching and rash.  Neurological: Negative for dizziness, extremity weakness, gait problem,  headaches, light-headedness and seizures.  Hematological: Negative for adenopathy. Does not bruise/bleed easily.  Psychiatric/Behavioral: Negative for confusion, depression and sleep disturbance. The patient is not nervous/anxious.     PHYSICAL EXAMINATION:  Blood pressure 130/89, pulse 73, temperature 98.2 F (36.8 C), temperature source Oral, resp. rate 18, height 5\' 10"  (1.778 m), weight 239 lb (108.4 kg), SpO2 99%.  ECOG PERFORMANCE STATUS: 1  Physical Exam  Constitutional: Oriented to person, place, and time and well-developed, well-nourished, and in no distress.  HENT:  Head: Normocephalic and atraumatic.  Mouth/Throat: Oropharynx is clear and moist. No oropharyngeal exudate.  Eyes: Conjunctivae are normal. Right eye exhibits no discharge. Left eye exhibits no discharge. No scleral icterus.  Neck: Normal range of motion. Neck supple.  Cardiovascular: Normal rate, regular rhythm, normal heart sounds and intact distal pulses.   Pulmonary/Chest: Effort normal and breath sounds normal. No respiratory distress. No wheezes. No rales.   Abdominal: Soft. Bowel sounds are normal. Exhibits no distension and no mass. There is no tenderness.  Musculoskeletal: Normal range of motion. Exhibits no edema.  Lymphadenopathy:    No cervical adenopathy.  Neurological: Alert and oriented to person, place, and time. Exhibits normal muscle tone. Gait normal. Coordination normal.  Skin: Skin is warm and dry. No rash noted. Not diaphoretic. No erythema. No pallor.  Psychiatric: Mood, memory and judgment normal.  Vitals reviewed.  LABORATORY DATA: Lab Results  Component Value Date   WBC 5.8 07/18/2023   HGB 14.5 07/18/2023   HCT 42.3 07/18/2023   MCV 92.4 07/18/2023   PLT 195 07/18/2023      Chemistry      Component Value Date/Time   NA 136 07/18/2023 0924   NA 139 07/25/2022 0935   K 4.8 07/18/2023 0924   CL 104 07/18/2023 0924   CO2 24 07/18/2023 0924   BUN 26 (H) 07/18/2023 0924   BUN 23 07/25/2022 0935   CREATININE 1.33 (H) 07/18/2023 0924      Component Value Date/Time   CALCIUM 9.3 07/18/2023 0924   ALKPHOS 48 07/18/2023 0924   AST 16 07/18/2023 0924   ALT 28 07/18/2023 0924   BILITOT 0.4 07/18/2023 0924       RADIOGRAPHIC STUDIES:  CT CHEST ABDOMEN PELVIS WO CONTRAST  Result Date: 07/13/2023 CLINICAL DATA:  Kidney cancer staging * Tracking Code: BO * EXAM: CT CHEST, ABDOMEN AND PELVIS WITHOUT CONTRAST TECHNIQUE: Multidetector CT imaging of the chest, abdomen and pelvis was performed following the standard protocol without IV contrast. RADIATION DOSE REDUCTION: This exam was performed according to the departmental dose-optimization program which includes automated exposure control, adjustment of the mA and/or kV according to patient size and/or use of iterative reconstruction technique. COMPARISON:  Multiple priors including CT March 08, 2023 FINDINGS: CT CHEST FINDINGS Cardiovascular: Aortic atherosclerosis. 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 Calcifications of the  aortic valve. Three-vessel coronary artery calcifications. Normal size heart. Mediastinum/Nodes: No suspicious thyroid nodule. Similar prominent partially visualized lower cervical lymph nodes. No pathologically enlarged mediastinal, hilar or axillary lymph nodes. The esophagus is grossly unremarkable. Lungs/Pleura: Stable scattered pulmonary nodules for instance a 2 mm pulmonary nodule in the right lung apex on image 37/4 and a calcified right upper lobe pulmonary nodule on image 73/4. No new suspicious pulmonary nodules or masses. Bibasilar atelectasis/scarring. Musculoskeletal: No aggressive lytic or blastic lesion of bone. Multilevel degenerative changes spine. CT ABDOMEN PELVIS FINDINGS Hepatobiliary: Ill-defined hypodensity along the falciform ligament is similar prior commonly reflects focal  fatty infiltration. Gallbladder is nondistended. No biliary ductal dilation. Pancreas: No pancreatic ductal dilation or evidence of acute inflammation. Spleen: No splenomegaly. Adrenals/Urinary Tract: Bilateral adrenal glands appear normal. Left kidney surgically absent without new nodularity in the nephrectomy bed. Unremarkable noncontrast enhanced appearance of the right kidney without hydronephrosis. Urinary bladder is unremarkable for degree of distension. Stomach/Bowel: Stomach is unremarkable for degree of distension. No pathologic dilation of small or large bowel. Normal appendix. No evidence of acute bowel inflammation. Sigmoid colonic diverticulosis without findings of acute diverticulitis. Vascular/Lymphatic: Aortic atherosclerosis. Normal caliber abdominal aorta. Smooth IVC contours. Portacaval lymph node measures 14 mm in short axis on image 67/2, unchanged. No concerning abdominal or pelvic adenopathy. Reproductive: Prostate is unremarkable. Other: No significant abdominopelvic free fluid. Small fat containing ventral hernias. Postsurgical change in the anterior abdominal wall. Musculoskeletal: No aggressive  lytic or blastic lesion of bone. Bilateral L5 pars defects, chronic. IMPRESSION: 1. Status post left nephrectomy without evidence of recurrent or metastatic disease in the chest, abdomen or pelvis. 2. 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 Recommend continued attention on oncologic imaging. 3. Stable prominent low cervical and mediastinal lymph nodes, not likely metastatic but suggest continued attention on follow-up imaging. 4. Aortic Atherosclerosis (ICD10-I70.0). Electronically Signed   By: Maudry Mayhew M.D.   On: 07/13/2023 13:46   DG Chest Portable 1 View  Result Date: 06/28/2023 CLINICAL DATA:  Shortness of breath EXAM: PORTABLE CHEST 1 VIEW COMPARISON:  Chest x-ray dated November 26, 2022 FINDINGS: The heart size and mediastinal contours are within normal limits. Both lungs are clear. The visualized skeletal structures are unremarkable. IMPRESSION: No active disease. Electronically Signed   By: Allegra Lai M.D.   On: 06/28/2023 09:25     ASSESSMENT/PLAN:  This is a very pleasant 57 year old Caucasian male diagnosed with stage IIIa (T3a, N0, M0) clear-cell renal cell carcinoma.  He does not have any evidence of metastatic disease.  He was diagnosed in June 2023.   He is status post left laparoscopic radical nephrectomy on 03/12/2022 under the care of Dr. Alvester Morin.  The final pathology showed clear cell renal cell carcinoma nuclear grade 4 measuring 7.0 cm with tumor extension into the renal vein and renal sinus.   The patient completed adjuvant immunotherapy with Keytruda 200 mg IV every 3 weeks. He is status post 17 cycles. The plan is to undergo 1 year of treatment which is 17 cycles. His last dose was on 06/13/23  The patient was seen with Dr. Arbutus Ped today.  Dr. Arbutus Ped personally and independently reviewed the scan and discussed results with the patient today.  The scan showed no evidence of disease progression.  Dr. Arbutus Ped recommends he continue on  observation with a restaging CT scan in 4 months.  Due to his nephrectomy, I will order his CT scan without contrast.  Will see him back for follow-up visit 7 to 10 days later to review the results in the office.  The patient was advised to call immediately if he has any concerning symptoms in the interval. The patient voices understanding of current disease status and treatment options and is in agreement with the current care plan. All questions were answered. The patient knows to call the clinic with any problems, questions or concerns. We can certainly see the patient much sooner if necessary   Orders Placed This Encounter  Procedures   CT CHEST ABDOMEN PELVIS WO CONTRAST    Standing Status:   Future  Standing Expiration Date:   07/17/2024    Order Specific Question:   Preferred imaging location?    Answer:   [facility]    Order Specific Question:   If indicated for the ordered procedure, I authorize the administration of oral contrast media per Radiology protocol    Answer:   Yes    Order Specific Question:   Does the patient have a contrast media/X-ray dye allergy?    Answer:   No   CBC with Differential (Cancer Center Only)    Standing Status:   Future    Standing Expiration Date:   07/17/2024   CMP (Cancer Center only)    Standing Status:   Future    Standing Expiration Date:   07/17/2024     Johnette Abraham Aimi Essner, PA-C 07/18/23  ADDENDUM: Hematology/Oncology Attending:  I had a face-to-face encounter with the patient today.  I reviewed his record, lab, scan and recommended his care plan.  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  The patient is  feeling fine today with no concerning complaints.  He recovered from COVID-19 infection leading to acute respiratory failure recently.  He was treated with IV Decadron and Paxlovid. The patient had repeat CT scan of the chest, abdomen and pelvis performed recently.  I personally and independently reviewed the scan and discussed the result with the patient today. His scan showed no concerning findings for disease progression. I recommended for the patient to continue on observation with repeat blood work and CT scan of the chest, abdomen and pelvis in 4 months. The patient was advised to call immediately if he has any other concerning symptoms in the interval. The total time spent in the appointment was 30 minutes. Disclaimer: This note was dictated with voice recognition software. Similar sounding words can inadvertently be transcribed and may be missed upon review. Lajuana Matte, MD

## 2023-07-18 ENCOUNTER — Inpatient Hospital Stay: Payer: BC Managed Care – PPO

## 2023-07-18 ENCOUNTER — Other Ambulatory Visit: Payer: Self-pay

## 2023-07-18 ENCOUNTER — Inpatient Hospital Stay: Payer: BC Managed Care – PPO | Attending: Oncology | Admitting: Physician Assistant

## 2023-07-18 VITALS — BP 130/89 | HR 73 | Temp 98.2°F | Resp 18 | Ht 70.0 in | Wt 239.0 lb

## 2023-07-18 DIAGNOSIS — Z905 Acquired absence of kidney: Secondary | ICD-10-CM | POA: Insufficient documentation

## 2023-07-18 DIAGNOSIS — C642 Malignant neoplasm of left kidney, except renal pelvis: Secondary | ICD-10-CM | POA: Insufficient documentation

## 2023-07-18 LAB — CBC WITH DIFFERENTIAL (CANCER CENTER ONLY)
Abs Immature Granulocytes: 0.02 10*3/uL (ref 0.00–0.07)
Basophils Absolute: 0.1 10*3/uL (ref 0.0–0.1)
Basophils Relative: 1 %
Eosinophils Absolute: 0.1 10*3/uL (ref 0.0–0.5)
Eosinophils Relative: 2 %
HCT: 42.3 % (ref 39.0–52.0)
Hemoglobin: 14.5 g/dL (ref 13.0–17.0)
Immature Granulocytes: 0 %
Lymphocytes Relative: 30 %
Lymphs Abs: 1.7 10*3/uL (ref 0.7–4.0)
MCH: 31.7 pg (ref 26.0–34.0)
MCHC: 34.3 g/dL (ref 30.0–36.0)
MCV: 92.4 fL (ref 80.0–100.0)
Monocytes Absolute: 0.4 10*3/uL (ref 0.1–1.0)
Monocytes Relative: 7 %
Neutro Abs: 3.5 10*3/uL (ref 1.7–7.7)
Neutrophils Relative %: 60 %
Platelet Count: 195 10*3/uL (ref 150–400)
RBC: 4.58 MIL/uL (ref 4.22–5.81)
RDW: 12.7 % (ref 11.5–15.5)
WBC Count: 5.8 10*3/uL (ref 4.0–10.5)
nRBC: 0 % (ref 0.0–0.2)

## 2023-07-18 LAB — CMP (CANCER CENTER ONLY)
ALT: 28 U/L (ref 0–44)
AST: 16 U/L (ref 15–41)
Albumin: 4.3 g/dL (ref 3.5–5.0)
Alkaline Phosphatase: 48 U/L (ref 38–126)
Anion gap: 8 (ref 5–15)
BUN: 26 mg/dL — ABNORMAL HIGH (ref 6–20)
CO2: 24 mmol/L (ref 22–32)
Calcium: 9.3 mg/dL (ref 8.9–10.3)
Chloride: 104 mmol/L (ref 98–111)
Creatinine: 1.33 mg/dL — ABNORMAL HIGH (ref 0.61–1.24)
GFR, Estimated: >60 mL/min (ref >60)
Glucose, Bld: 178 mg/dL — ABNORMAL HIGH (ref 70–99)
Potassium: 4.8 mmol/L (ref 3.5–5.1)
Sodium: 136 mmol/L (ref 135–145)
Total Bilirubin: 0.4 mg/dL (ref 0.3–1.2)
Total Protein: 7 g/dL (ref 6.5–8.1)

## 2023-07-18 LAB — LACTATE DEHYDROGENASE: LDH: 164 U/L (ref 98–192)

## 2023-07-20 ENCOUNTER — Other Ambulatory Visit: Payer: Self-pay

## 2023-07-21 ENCOUNTER — Encounter: Payer: Self-pay | Admitting: Internal Medicine

## 2023-07-26 ENCOUNTER — Ambulatory Visit: Payer: BC Managed Care – PPO | Admitting: Internal Medicine

## 2023-07-30 DIAGNOSIS — J309 Allergic rhinitis, unspecified: Secondary | ICD-10-CM | POA: Diagnosis not present

## 2023-08-19 DIAGNOSIS — C642 Malignant neoplasm of left kidney, except renal pelvis: Secondary | ICD-10-CM | POA: Diagnosis not present

## 2023-08-26 DIAGNOSIS — C642 Malignant neoplasm of left kidney, except renal pelvis: Secondary | ICD-10-CM | POA: Diagnosis not present

## 2023-08-29 ENCOUNTER — Encounter: Payer: Self-pay | Admitting: Primary Care

## 2023-08-29 ENCOUNTER — Ambulatory Visit: Payer: BC Managed Care – PPO | Admitting: Primary Care

## 2023-08-29 VITALS — BP 125/82 | HR 83 | Temp 97.5°F | Ht 70.0 in | Wt 249.8 lb

## 2023-08-29 DIAGNOSIS — J984 Other disorders of lung: Secondary | ICD-10-CM | POA: Diagnosis not present

## 2023-08-29 DIAGNOSIS — J4489 Other specified chronic obstructive pulmonary disease: Secondary | ICD-10-CM

## 2023-08-29 NOTE — Patient Instructions (Signed)
-  COVID-19: You have fully recovered from your recent COVID-19 infection, which was treated with steroids and Paxlovid. You required oxygen for a short period but have since returned to your normal health. No changes to your current management are needed.  -ASTHMA AND OCCUPATIONAL LUNG DISEASE: Your asthma and occupational lung disease are stable with your current medications, Breztri and Dupixent. You have not needed your rescue inhaler or nebulizer recently. Continue with your current medications and follow up in 6 months.   -STATUS POST LEFT NEPHRECTOMY FOR KIDNEY CANCER: Your recent imaging shows no evidence of recurrent metastatic disease, and your lymph nodes are stable. Continue with your current follow-up plan.  Follow-up: - 6 months with Dr. Tonia Brooms (asthma fu)

## 2023-08-29 NOTE — Progress Notes (Signed)
@Patient  ID: Russell Burns, male    DOB: October 28, 1965, 57 y.o.   MRN: [id number]  No chief complaint on file.   Referring provider: [name], MD  HPI: 57 year old, never smoked. PMH significant HTN, CHF, asthmatic bronchitis, occupational lung disease, acute hypoxemic respiratory failure, GERD, hyperlipidemia,    08/29/2023 Discussed the use of AI scribe software for clinical note transcription with the patient, who gave verbal consent to proceed.  History of Present Illness   Patient presents today for hospital follow-up. History of asthma and occupational lung disease. He reports no residual symptoms since being hospitalized in October for COVID and acute respiratory failure. He was treated with steroids and Paxlovid during his hospital stay and required oxygen for a short period. He continues to use a CPAP machine at night, managed by [facility].  The patient is compliant with his asthma medications, Breztri and Dupixent, with the last Dupixent injection administered on the 30th. He reports no need for his rescue inhaler or nebulizer since his hospital stay.  In addition to his respiratory conditions, the patient has a history of kidney cancer and underwent a left nephrectomy. He had a CT scan of the chest, abdomen, and pelvis in October, which showed no evidence of recurrent metastatic disease. The scan also revealed some small pulmonary nodules and atelectasis or scarring in the lungs.  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 He reports less sensitivity to the chemical since discontinuing immunotherapy.  The patient's lab work in July showed an IgE level of 355 and elevated eosinophils 400, indicating an allergic component to his asthma. He reports an improvement in his asthma symptoms since starting Dupixent. He has received his flu shot.      Allergies  Allergen Reactions   Bee Venom  Anaphylaxis   Other Itching, Swelling and Other (See Comments)    Feline dander- Runny nose, itchy/puffy eyes    Immunization History  Administered Date(s) Administered   Influenza, Seasonal, Injecte, Preservative Fre 06/29/2023   Influenza-Unspecified 08/13/2022   PFIZER(Purple Top)SARS-COV-2 Vaccination 12/18/2019, 01/12/2020   Tdap 05/10/2023    Past Medical History:  Diagnosis Date   Allergy    Aortic stenosis    Arthritis    Blood transfusion without reported diagnosis    Cancer (HCC)    Chronic kidney disease    Diabetes mellitus without complication (HCC)    FH: thoracic aneurysm    Hyperlipidemia    Hypertension    Obesity     Tobacco History: Social History   Tobacco Use  Smoking Status Never  Smokeless Tobacco Current   Types: Chew   Ready to quit: Not Answered Counseling given: Not Answered   Outpatient Medications Prior to Visit  Medication Sig Dispense Refill   acetaminophen (TYLENOL) 500 MG tablet Take 1,000 mg by mouth in the morning.     albuterol (PROVENTIL) (2.5 MG/3ML) 0.083% nebulizer solution Take 3 mLs (2.5 mg total) by nebulization every 6 (six) hours as needed for wheezing or shortness of breath. 75 mL 12   albuterol (VENTOLIN HFA) 108 (90 Base) MCG/ACT inhaler Inhale 2 puffs into the lungs every 6 (six) hours as needed for wheezing or shortness of breath.     amLODipine (NORVASC) 10 MG tablet Take 10 mg by mouth daily.     atorvastatin (LIPITOR) 80 MG tablet Take 80 mg by mouth daily.     benzonatate (TESSALON) 200  MG capsule Take 1 capsule (200 mg total) by mouth 3 (three) times daily as needed. 20 capsule 0   Budeson-Glycopyrrol-Formoterol (BREZTRI AEROSPHERE) 160-9-4.8 MCG/ACT AERO Inhale 2 puffs into the lungs 2 (two) times daily. Take 2 puffs first thing in am and then another 2 puffs about 12 hours later. 10.7 g 11   cetirizine (ZYRTEC) 10 MG tablet Take 10 mg by mouth 2 (two) times daily.     Dupilumab (DUPIXENT) 200 MG/1. SOPN  Inject 200 mg into the skin every 14 (fourteen) days. Loading dose received in clinic on 7/25. 6.84 mL 1   EPINEPHrine 0.3 mg/0.3 mL IJ SOAJ injection Inject 0.3 mg into the muscle as needed for anaphylaxis.     ezetimibe (ZETIA) 10 MG tablet Take 10 mg by mouth daily.     famotidine (PEPCID) 20 MG tablet TAKE 1 TABLET BY MOUTH EVERY DAY AFTER SUPPER 90 tablet 0   fluticasone (FLONASE) 50 MCG/ACT nasal spray Place 2 sprays into both nostrils in the morning and at bedtime.     ipratropium-albuterol (DUONEB) 0.5-2.5 (3) MG/3ML SOLN Take 3 mLs by nebulization every 6 (six) hours as needed (sob/wheezing).     JARDIANCE 25 MG TABS tablet Take 25 mg by mouth daily.     levothyroxine (SYNTHROID) 25 MCG tablet Take 1 tablet (25 mcg total) by mouth daily before breakfast. 30 tablet 2   losartan (COZAAR) 100 MG tablet Take 100 mg by mouth daily.     metFORMIN (GLUCOPHAGE) 1000 MG tablet Take 1,000 mg by mouth 2 (two) times daily with a meal.     ONETOUCH VERIO test strip 1 each by Other route 5 (five) times daily.     pantoprazole (PROTONIX) 40 MG tablet Take 1 tablet (40 mg total) by mouth 2 (two) times daily. 60 tablet 0   prochlorperazine (COMPAZINE) 10 MG tablet Take 1 tablet (10 mg total) by mouth every 6 (six) hours as needed. (Patient taking differently: Take 10 mg by mouth every 6 (six) hours as needed for nausea or vomiting.) 30 tablet 0   REPATHA 140 MG/ML SOSY Inject 140 mg into the skin every 14 (fourteen) days.     tamsulosin (FLOMAX) 0.4 MG CAPS capsule Take 1 capsule (0.4 mg total) by mouth daily. 14 capsule 0   tirzepatide (MOUNJARO) 10 MG/0.5ML Pen Inject 10 mg into the skin once a week. (Patient taking differently: Inject 10 mg into the skin every Friday.) 6 mL 3   No facility-administered medications prior to visit.   Review of Systems  Review of Systems  Constitutional: Negative.   Respiratory: Negative.     Physical Exam  There were no vitals taken for this visit. Physical  Exam Constitutional:      General: He is not in acute distress.    Appearance: Normal appearance. He is not ill-appearing.  HENT:     Head: Normocephalic and atraumatic.     Mouth/Throat:     Mouth: Mucous membranes are moist.     Pharynx: Oropharynx is clear.  Cardiovascular:     Rate and Rhythm: Normal rate and regular rhythm.  Musculoskeletal:        General: Normal range of motion.  Skin:    General: Skin is warm and dry.  Neurological:     General: No focal deficit present.     Mental Status: He is alert and oriented to person, place, and time. Mental status is at baseline.  Psychiatric:        Mood  and Affect: Mood normal.        Behavior: Behavior normal.        Thought Content: Thought content normal.        Judgment: Judgment normal.      Lab Results:  CBC    Component Value Date/Time   WBC 5.8 07/18/2023 0924   WBC 11.4 (H) 07/02/2023 0359   RBC 4.58 07/18/2023 0924   HGB 14.5 07/18/2023 0924   HCT 42.3 07/18/2023 0924   PLT 195 07/18/2023 0924   MCV 92.4 07/18/2023 0924   MCH 31.7 07/18/2023 0924   MCHC 34.3 07/18/2023 0924   RDW 12.7 07/18/2023 0924   LYMPHSABS 1.7 07/18/2023 0924   MONOABS 0.4 07/18/2023 0924   EOSABS 0.1 07/18/2023 0924   BASOSABS 0.1 07/18/2023 0924    BMET    Component Value Date/Time   NA 136 07/18/2023 0924   NA 139 07/25/2022 0935   K 4.8 07/18/2023 0924   CL 104 07/18/2023 0924   CO2 24 07/18/2023 0924   GLUCOSE 178 (H) 07/18/2023 0924   BUN 26 (H) 07/18/2023 0924   BUN 23 07/25/2022 0935   CREATININE 1.33 (H) 07/18/2023 0924   CALCIUM 9.3 07/18/2023 0924   GFRNONAA >60 07/18/2023 0924    BNP    Component Value Date/Time   BNP 53.6 06/28/2023 0902    ProBNP    Component Value Date/Time   PROBNP 43.0 06/11/2022 1600    Imaging: No results found.   Assessment & Plan:   1. Asthmatic bronchitis , chronic (HCC)  2. Occupational lung disease     COVID-19 Recovered from recent hospitalization in  October 2024. Treated with steroids and Paxlovid. Required oxygen for a short period but has since returned to baseline. No changes to current management.  Asthma and Occupational Lung Disease Stable on Breztri and Dupixent. Last Dupixent injection on 08/24/2023. No recent use of rescue inhaler or nebulizer. Continue current management with Breztri and Dupixent. Next follow-up in 6 months.  Status Post Left Nephrectomy for Kidney Cancer No evidence of recurrent metastatic disease on recent imaging. Continue current follow-up plan.  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 Continue current protective measures at work.  General Health Maintenance Received flu shot.      Glenford Bayley, NP 08/29/2023

## 2023-09-04 ENCOUNTER — Telehealth: Payer: Self-pay | Admitting: Pharmacist

## 2023-09-04 NOTE — Telephone Encounter (Signed)
Submitted a Prior Authorization RENEWAL request to [facility] for DUPIXENT via fax. Will update once we receive a response.  Case # [id number]  Chesley Mires, PharmD, MPH, BCPS, CPP Clinical Pharmacist (Rheumatology and Pulmonology)

## 2023-09-06 DIAGNOSIS — E032 Hypothyroidism due to medicaments and other exogenous substances: Secondary | ICD-10-CM | POA: Diagnosis not present

## 2023-09-06 DIAGNOSIS — E1165 Type 2 diabetes mellitus with hyperglycemia: Secondary | ICD-10-CM | POA: Diagnosis not present

## 2023-09-12 NOTE — Telephone Encounter (Signed)
Received notification from [facility] regarding a prior authorization for DUPIXENT. Authorization has been APPROVED from 09/05/23 to 09/04/24. Approval letter sent to scan center.  Patient must continue to fill through [facility]: [phone]  Authorization # [id number]  Chesley Mires, PharmD, MPH, BCPS, CPP Clinical Pharmacist (Rheumatology and Pulmonology)

## 2023-09-17 DIAGNOSIS — I10 Essential (primary) hypertension: Secondary | ICD-10-CM | POA: Diagnosis not present

## 2023-09-17 DIAGNOSIS — G4733 Obstructive sleep apnea (adult) (pediatric): Secondary | ICD-10-CM | POA: Diagnosis not present

## 2023-10-09 ENCOUNTER — Other Ambulatory Visit: Payer: Self-pay | Admitting: Pulmonary Disease

## 2023-10-09 DIAGNOSIS — J4530 Mild persistent asthma, uncomplicated: Secondary | ICD-10-CM

## 2023-10-09 MED ORDER — DUPIXENT 200 MG/1.14ML ~~LOC~~ SOAJ
200.0000 mg | SUBCUTANEOUS | 2 refills | Status: DC
Start: 2023-10-09 — End: 2023-11-18

## 2023-10-09 NOTE — Addendum Note (Signed)
 Addended by: Thais Fill on: 10/09/2023 10:15 AM   Modules accepted: Orders

## 2023-10-10 ENCOUNTER — Other Ambulatory Visit: Payer: Self-pay

## 2023-10-21 DIAGNOSIS — Z8679 Personal history of other diseases of the circulatory system: Secondary | ICD-10-CM | POA: Diagnosis not present

## 2023-10-21 DIAGNOSIS — Z860101 Personal history of adenomatous and serrated colon polyps: Secondary | ICD-10-CM | POA: Diagnosis not present

## 2023-10-22 DIAGNOSIS — G4733 Obstructive sleep apnea (adult) (pediatric): Secondary | ICD-10-CM | POA: Diagnosis not present

## 2023-10-30 DIAGNOSIS — J069 Acute upper respiratory infection, unspecified: Secondary | ICD-10-CM | POA: Diagnosis not present

## 2023-10-30 DIAGNOSIS — R0981 Nasal congestion: Secondary | ICD-10-CM | POA: Diagnosis not present

## 2023-10-30 DIAGNOSIS — R059 Cough, unspecified: Secondary | ICD-10-CM | POA: Diagnosis not present

## 2023-11-04 DIAGNOSIS — J069 Acute upper respiratory infection, unspecified: Secondary | ICD-10-CM | POA: Diagnosis not present

## 2023-11-04 DIAGNOSIS — R051 Acute cough: Secondary | ICD-10-CM | POA: Diagnosis not present

## 2023-11-04 DIAGNOSIS — I130 Hypertensive heart and chronic kidney disease with heart failure and stage 1 through stage 4 chronic kidney disease, or unspecified chronic kidney disease: Secondary | ICD-10-CM | POA: Diagnosis not present

## 2023-11-04 DIAGNOSIS — E1165 Type 2 diabetes mellitus with hyperglycemia: Secondary | ICD-10-CM | POA: Diagnosis not present

## 2023-11-06 ENCOUNTER — Ambulatory Visit (HOSPITAL_COMMUNITY)
Admission: RE | Admit: 2023-11-06 | Discharge: 2023-11-06 | Disposition: A | Discharge status: Home / Self Care | Payer: BC Managed Care – PPO | Source: Ambulatory Visit | Attending: Physician Assistant | Admitting: Physician Assistant

## 2023-11-06 DIAGNOSIS — C649 Malignant neoplasm of unspecified kidney, except renal pelvis: Secondary | ICD-10-CM | POA: Diagnosis not present

## 2023-11-06 DIAGNOSIS — Z905 Acquired absence of kidney: Secondary | ICD-10-CM | POA: Diagnosis not present

## 2023-11-06 DIAGNOSIS — I700 Atherosclerosis of aorta: Secondary | ICD-10-CM | POA: Diagnosis not present

## 2023-11-06 DIAGNOSIS — C642 Malignant neoplasm of left kidney, except renal pelvis: Secondary | ICD-10-CM | POA: Diagnosis not present

## 2023-11-09 ENCOUNTER — Other Ambulatory Visit: Payer: Self-pay | Admitting: Nurse Practitioner

## 2023-11-09 DIAGNOSIS — J4541 Moderate persistent asthma with (acute) exacerbation: Secondary | ICD-10-CM

## 2023-11-11 ENCOUNTER — Inpatient Hospital Stay: Payer: BC Managed Care – PPO

## 2023-11-11 ENCOUNTER — Other Ambulatory Visit: Payer: Self-pay

## 2023-11-11 ENCOUNTER — Inpatient Hospital Stay (HOSPITAL_BASED_OUTPATIENT_CLINIC_OR_DEPARTMENT_OTHER): Payer: BC Managed Care – PPO | Admitting: Internal Medicine

## 2023-11-11 VITALS — BP 146/90 | HR 80 | Temp 97.6°F | Resp 17 | Ht 70.0 in | Wt 244.1 lb

## 2023-11-11 DIAGNOSIS — J9601 Acute respiratory failure with hypoxia: Secondary | ICD-10-CM | POA: Diagnosis not present

## 2023-11-11 DIAGNOSIS — R0602 Shortness of breath: Secondary | ICD-10-CM | POA: Diagnosis not present

## 2023-11-11 DIAGNOSIS — Z7984 Long term (current) use of oral hypoglycemic drugs: Secondary | ICD-10-CM | POA: Diagnosis not present

## 2023-11-11 DIAGNOSIS — I5031 Acute diastolic (congestive) heart failure: Secondary | ICD-10-CM | POA: Diagnosis not present

## 2023-11-11 DIAGNOSIS — E1169 Type 2 diabetes mellitus with other specified complication: Secondary | ICD-10-CM | POA: Diagnosis not present

## 2023-11-11 DIAGNOSIS — J189 Pneumonia, unspecified organism: Secondary | ICD-10-CM | POA: Diagnosis not present

## 2023-11-11 DIAGNOSIS — C642 Malignant neoplasm of left kidney, except renal pelvis: Secondary | ICD-10-CM | POA: Diagnosis not present

## 2023-11-11 DIAGNOSIS — Z905 Acquired absence of kidney: Secondary | ICD-10-CM | POA: Insufficient documentation

## 2023-11-11 DIAGNOSIS — Z85528 Personal history of other malignant neoplasm of kidney: Secondary | ICD-10-CM | POA: Diagnosis not present

## 2023-11-11 DIAGNOSIS — E039 Hypothyroidism, unspecified: Secondary | ICD-10-CM | POA: Diagnosis not present

## 2023-11-11 DIAGNOSIS — N1831 Chronic kidney disease, stage 3a: Secondary | ICD-10-CM | POA: Diagnosis not present

## 2023-11-11 DIAGNOSIS — Z79890 Hormone replacement therapy (postmenopausal): Secondary | ICD-10-CM | POA: Insufficient documentation

## 2023-11-11 DIAGNOSIS — Z1152 Encounter for screening for COVID-19: Secondary | ICD-10-CM | POA: Diagnosis not present

## 2023-11-11 DIAGNOSIS — I130 Hypertensive heart and chronic kidney disease with heart failure and stage 1 through stage 4 chronic kidney disease, or unspecified chronic kidney disease: Secondary | ICD-10-CM | POA: Diagnosis not present

## 2023-11-11 DIAGNOSIS — I350 Nonrheumatic aortic (valve) stenosis: Secondary | ICD-10-CM | POA: Diagnosis not present

## 2023-11-11 DIAGNOSIS — E785 Hyperlipidemia, unspecified: Secondary | ICD-10-CM | POA: Diagnosis not present

## 2023-11-11 DIAGNOSIS — J069 Acute upper respiratory infection, unspecified: Secondary | ICD-10-CM | POA: Insufficient documentation

## 2023-11-11 DIAGNOSIS — E1122 Type 2 diabetes mellitus with diabetic chronic kidney disease: Secondary | ICD-10-CM | POA: Diagnosis not present

## 2023-11-11 DIAGNOSIS — D472 Monoclonal gammopathy: Secondary | ICD-10-CM | POA: Diagnosis not present

## 2023-11-11 DIAGNOSIS — J4489 Other specified chronic obstructive pulmonary disease: Secondary | ICD-10-CM | POA: Diagnosis not present

## 2023-11-11 DIAGNOSIS — Z79899 Other long term (current) drug therapy: Secondary | ICD-10-CM | POA: Diagnosis not present

## 2023-11-11 DIAGNOSIS — Z9226 Personal history of immune checkpoint inhibitor therapy: Secondary | ICD-10-CM | POA: Diagnosis not present

## 2023-11-11 DIAGNOSIS — I5032 Chronic diastolic (congestive) heart failure: Secondary | ICD-10-CM | POA: Diagnosis not present

## 2023-11-11 DIAGNOSIS — J440 Chronic obstructive pulmonary disease with acute lower respiratory infection: Secondary | ICD-10-CM | POA: Diagnosis not present

## 2023-11-11 DIAGNOSIS — J441 Chronic obstructive pulmonary disease with (acute) exacerbation: Secondary | ICD-10-CM | POA: Diagnosis not present

## 2023-11-11 DIAGNOSIS — J4541 Moderate persistent asthma with (acute) exacerbation: Secondary | ICD-10-CM | POA: Diagnosis not present

## 2023-11-11 DIAGNOSIS — J40 Bronchitis, not specified as acute or chronic: Secondary | ICD-10-CM | POA: Diagnosis not present

## 2023-11-11 DIAGNOSIS — I712 Thoracic aortic aneurysm, without rupture, unspecified: Secondary | ICD-10-CM | POA: Diagnosis not present

## 2023-11-11 DIAGNOSIS — Z7985 Long-term (current) use of injectable non-insulin antidiabetic drugs: Secondary | ICD-10-CM | POA: Diagnosis not present

## 2023-11-11 DIAGNOSIS — R059 Cough, unspecified: Secondary | ICD-10-CM | POA: Diagnosis not present

## 2023-11-11 DIAGNOSIS — Z8249 Family history of ischemic heart disease and other diseases of the circulatory system: Secondary | ICD-10-CM | POA: Diagnosis not present

## 2023-11-11 DIAGNOSIS — K219 Gastro-esophageal reflux disease without esophagitis: Secondary | ICD-10-CM | POA: Diagnosis not present

## 2023-11-11 LAB — CMP (CANCER CENTER ONLY)
ALT: 18 U/L (ref 0–44)
AST: 31 U/L (ref 15–41)
Albumin: 4.8 g/dL (ref 3.5–5.0)
Alkaline Phosphatase: 57 U/L (ref 38–126)
Anion gap: 8 (ref 5–15)
BUN: 14 mg/dL (ref 6–20)
CO2: 28 mmol/L (ref 22–32)
Calcium: 9.9 mg/dL (ref 8.9–10.3)
Chloride: 101 mmol/L (ref 98–111)
Creatinine: 1.25 mg/dL — ABNORMAL HIGH (ref 0.61–1.24)
GFR, Estimated: >60 mL/min (ref >60)
Glucose, Bld: 151 mg/dL — ABNORMAL HIGH (ref 70–99)
Potassium: 4.4 mmol/L (ref 3.5–5.1)
Sodium: 137 mmol/L (ref 135–145)
Total Bilirubin: 0.3 mg/dL (ref 0.0–1.2)
Total Protein: 7.2 g/dL (ref 6.5–8.1)

## 2023-11-11 LAB — CBC WITH DIFFERENTIAL (CANCER CENTER ONLY)
Abs Immature Granulocytes: 0.02 10*3/uL (ref 0.00–0.07)
Basophils Absolute: 0.2 10*3/uL — ABNORMAL HIGH (ref 0.0–0.1)
Basophils Relative: 2 %
Eosinophils Absolute: 4.7 10*3/uL — ABNORMAL HIGH (ref 0.0–0.5)
Eosinophils Relative: 37 %
HCT: 44.3 % (ref 39.0–52.0)
Hemoglobin: 14.7 g/dL (ref 13.0–17.0)
Immature Granulocytes: 0 %
Lymphocytes Relative: 12 %
Lymphs Abs: 1.6 10*3/uL (ref 0.7–4.0)
MCH: 31 pg (ref 26.0–34.0)
MCHC: 33.2 g/dL (ref 30.0–36.0)
MCV: 93.5 fL (ref 80.0–100.0)
Monocytes Absolute: 0.5 10*3/uL (ref 0.1–1.0)
Monocytes Relative: 4 %
Neutro Abs: 5.8 10*3/uL (ref 1.7–7.7)
Neutrophils Relative %: 45 %
Platelet Count: 244 10*3/uL (ref 150–400)
RBC: 4.74 MIL/uL (ref 4.22–5.81)
RDW: 12.4 % (ref 11.5–15.5)
WBC Count: 12.8 10*3/uL — ABNORMAL HIGH (ref 4.0–10.5)
nRBC: 0 % (ref 0.0–0.2)

## 2023-11-11 NOTE — Progress Notes (Signed)
 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  PRIOR THERAPY:  1) Status post left laparoscopic radical nephrectomy on 03/12/2022 under the care of Dr. 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 2) Adjuvant immunotherapy with Keytruda 200 Mg IV every 3 weeks status post 17 cycles.  CURRENT THERAPY: Observation.  INTERVAL HISTORY: Russell Burns 58 y.o. male returns to the clinic today for follow-up visit.Discussed the use of AI scribe software for clinical note transcription with the patient, who gave verbal consent to proceed.  History of Present Illness   Russell Burns "Jerilee Field" is a 58 year old male with stage three renal cell carcinoma who presents with persistent upper respiratory symptoms. He was referred by Dr. Timothy Lasso for evaluation of persistent upper respiratory symptoms.  He has been experiencing persistent upper respiratory symptoms for about two weeks, despite completing a five-day course of erythromycin. No chest pain, shortness of breath, productive cough, nausea, vomiting, fever, or chills. He notes a slight weight loss of about three to four pounds, which he describes as 'not much'.  His past medical history is significant for stage three renal cell carcinoma, for which he underwent a left radical nephrectomy. He completed a one-year course of immunotherapy with Rande Lawman, which was discontinued in September 2024. Since then, he has been under observation.        MEDICAL HISTORY: Past Medical History:  Diagnosis Date   Allergy    Aortic stenosis    Arthritis    Blood transfusion without reported diagnosis     Cancer (HCC)    Chronic kidney disease    Diabetes mellitus without complication (HCC)    FH: thoracic aneurysm    Hyperlipidemia    Hypertension    Obesity     ALLERGIES:  is allergic to bee venom and other.  MEDICATIONS:  Current Outpatient Medications  Medication Sig Dispense Refill   acetaminophen (TYLENOL) 500 MG tablet Take 1,000 mg by mouth in the morning.     albuterol (PROVENTIL) (2.5 MG/3ML) 0.083% nebulizer solution Take 3 mLs (2.5 mg total) by nebulization every 6 (six) hours as needed for wheezing or shortness of breath. 75 mL 12   albuterol (VENTOLIN HFA) 108 (90 Base) MCG/ACT inhaler Inhale 2 puffs into the lungs every 6 (six) hours as needed for wheezing or shortness of breath.     amLODipine (NORVASC) 10 MG tablet Take 10 mg by mouth daily.     atorvastatin (LIPITOR) 80 MG tablet Take 80 mg by mouth daily.     benzonatate (TESSALON) 200 MG capsule Take 1 capsule (200 mg total) by mouth 3 (three) times daily as needed. 20 capsule 0   Budeson-Glycopyrrol-Formoterol (BREZTRI AEROSPHERE) 160-9-4.8 MCG/ACT AERO Inhale 2 puffs into the lungs 2 (two) times daily. Take 2 puffs first thing in am and then another 2 puffs about 12 hours later. 10.7 g 11   cetirizine (ZYRTEC) 10 MG tablet Take 10 mg by mouth 2 (two) times daily.     Dupilumab (DUPIXENT) 200 MG/1. SOAJ Inject 200 mg into the skin every 14 (fourteen) days. 6.84 mL 2   EPINEPHrine 0.3 mg/0.3 mL IJ SOAJ injection Inject 0.3  mg into the muscle as needed for anaphylaxis.     ezetimibe (ZETIA) 10 MG tablet Take 10 mg by mouth daily.     famotidine (PEPCID) 20 MG tablet TAKE 1 TABLET BY MOUTH EVERY DAY AFTER SUPPER 90 tablet 0   fluticasone (FLONASE) 50 MCG/ACT nasal spray Place 2 sprays into both nostrils in the morning and at bedtime.     ipratropium-albuterol (DUONEB) 0.5-2.5 (3) MG/3ML SOLN Take 3 mLs by nebulization every 6 (six) hours as needed (sob/wheezing).     JARDIANCE 25 MG TABS tablet Take 25 mg by mouth  daily.     levothyroxine (SYNTHROID) 25 MCG tablet Take 1 tablet (25 mcg total) by mouth daily before breakfast. 30 tablet 2   losartan (COZAAR) 100 MG tablet Take 100 mg by mouth daily.     metFORMIN (GLUCOPHAGE) 1000 MG tablet Take 1,000 mg by mouth 2 (two) times daily with a meal.     ONETOUCH VERIO test strip 1 each by Other route 5 (five) times daily.     pantoprazole (PROTONIX) 40 MG tablet Take 1 tablet (40 mg total) by mouth 2 (two) times daily. 60 tablet 0   prochlorperazine (COMPAZINE) 10 MG tablet Take 1 tablet (10 mg total) by mouth every 6 (six) hours as needed. (Patient taking differently: Take 10 mg by mouth every 6 (six) hours as needed for nausea or vomiting.) 30 tablet 0   REPATHA 140 MG/ML SOSY Inject 140 mg into the skin every 14 (fourteen) days.     tamsulosin (FLOMAX) 0.4 MG CAPS capsule Take 1 capsule (0.4 mg total) by mouth daily. 14 capsule 0   tirzepatide (MOUNJARO) 10 MG/0.5ML Pen Inject 10 mg into the skin once a week. (Patient taking differently: Inject 10 mg into the skin every Friday.) 6 mL 3   No current facility-administered medications for this visit.    SURGICAL HISTORY:  Past Surgical History:  Procedure Laterality Date   CYSTOSCOPY WITH URETEROSCOPY AND STENT PLACEMENT Left 02/12/2022   Procedure: CYSTOSCOPY WITH LEFT RETROGRADE PYELOGRAM, DIAGNOSTIC URETEROSCOPY AND STENT PLACEMENT;  Surgeon: Crista Elliot, MD;  Location: [facility];  Service: Urology;  Laterality: Left;   LAPAROSCOPIC NEPHRECTOMY, HAND ASSISTED Left 03/12/2022   Procedure: LEFT HAND ASSISTED LAPAROSCOPIC NEPHRECTOMY;  Surgeon: Crista Elliot, MD;  Location: [facility];  Service: Urology;  Laterality: Left;  NEED 2.5 HRS FOR THIS CASE   TONSILLECTOMY     WISDOM TOOTH EXTRACTION      REVIEW OF SYSTEMS:  A comprehensive review of systems was negative except for: Constitutional: positive for fatigue Ears, nose, mouth, throat, and face: positive for nasal congestion   PHYSICAL EXAMINATION:  General appearance: alert, cooperative, and no distress Head: Normocephalic, without obvious abnormality, atraumatic Neck: no adenopathy, no JVD, supple, symmetrical, trachea midline, and thyroid not enlarged, symmetric, no tenderness/mass/nodules Lymph nodes: Cervical, supraclavicular, and axillary nodes normal. Resp: clear to auscultation bilaterally Back: symmetric, no curvature. ROM normal. No CVA tenderness. Cardio: regular rate and rhythm, S1, S2 normal, no murmur, click, rub or gallop GI: soft, non-tender; bowel sounds normal; no masses,  no organomegaly Extremities: extremities normal, atraumatic, no cyanosis or edema  ECOG PERFORMANCE STATUS: 0 - Asymptomatic  Blood pressure (!) 146/90, pulse 80, temperature 97.6 F (36.4 C), temperature source Temporal, resp. rate 17, height 5\' 10"  (1.778 m), weight 244 lb 1.6 oz (110.7 kg), SpO2 97%.  LABORATORY DATA: Lab Results  Component Value Date   WBC 5.8 07/18/2023   HGB 14.5  07/18/2023   HCT 42.3 07/18/2023   MCV 92.4 07/18/2023   PLT 195 07/18/2023      Chemistry      Component Value Date/Time   NA 136 07/18/2023 0924   NA 139 07/25/2022 0935   K 4.8 07/18/2023 0924   CL 104 07/18/2023 0924   CO2 24 07/18/2023 0924   BUN 26 (H) 07/18/2023 0924   BUN 23 07/25/2022 0935   CREATININE 1.33 (H) 07/18/2023 0924      Component Value Date/Time   CALCIUM 9.3 07/18/2023 0924   ALKPHOS 48 07/18/2023 0924   AST 16 07/18/2023 0924   ALT 28 07/18/2023 0924   BILITOT 0.4 07/18/2023 0924       RADIOGRAPHIC STUDIES: CT CHEST ABDOMEN PELVIS WO CONTRAST Result Date: 11/06/2023 CLINICAL DATA:  Kidney cancer, metastatic disease evaluation. * Tracking Code: BO * EXAM: CT CHEST, ABDOMEN AND PELVIS WITHOUT CONTRAST TECHNIQUE: Multidetector CT imaging of the chest, abdomen and pelvis was performed following the standard protocol without IV contrast. RADIATION DOSE REDUCTION: This exam was performed according to the departmental  dose-optimization program which includes automated exposure control, adjustment of the mA and/or kV according to patient size and/or use of iterative reconstruction technique. COMPARISON:  Multiple priors including most recent CT July 04, 2023 FINDINGS: CT CHEST FINDINGS Cardiovascular: Aortic atherosclerosis. 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 Coronary artery calcifications. Calcifications of the aortic valve. Normal size heart. No significant pericardial effusion/thickening. Mediastinum/Nodes: Prominent partially visualized low cervical lymph nodes are similar prior examination. No suspicious thyroid nodule. Prominent mediastinal lymph nodes are stable from prior examination. No pathologically enlarged mediastinal, hilar or axillary lymph nodes. The esophagus is grossly unremarkable. Lungs/Pleura: Stable scattered pulmonary nodules for instance a 2 mm nodule in the right lung apex on image 42/6. No new suspicious pulmonary nodules or masses. Musculoskeletal: No aggressive lytic or blastic lesion of bone. Multilevel degenerative changes spine. CT ABDOMEN PELVIS FINDINGS Hepatobiliary: No suspicious hepatic lesion on noncontrast enhanced examination of the liver. Gallbladder is unremarkable. No biliary ductal dilation. Pancreas: No pancreatic ductal dilation or evidence of acute inflammation Spleen: No splenomegaly. Adrenals/Urinary Tract: No suspicious adrenal nodule/mass. Left kidney is surgically absent without new suspicious nodularity in the nephrectomy bed. Unremarkable noncontrast enhanced appearance of the right kidney without hydronephrosis. Urinary bladder is unremarkable for degree of distension. Stomach/Bowel: No radiopaque enteric contrast material was administered. Stomach is unremarkable for degree of distension. Normal appendix. Colonic diverticulosis. Vascular/Lymphatic: Normal caliber abdominal aorta. Aortic atherosclerosis. Smooth IVC contours. Stable 14 mm  portacaval lymph node on image 66/2. No concerning abdominal or pelvic adenopathy. Reproductive: Prostate is unremarkable. Other: No significant abdominopelvic free fluid. Small fat containing ventral hernias. Postsurgical change in the abdominal wall. Musculoskeletal: No aggressive lytic or blastic lesion of bone. Chronic bilateral pars defects at L5. IMPRESSION: 1. Stable examination status post left nephrectomy without evidence of local recurrence or metastatic disease in the chest, abdomen or pelvis on this noncontrast enhanced examination. 2. 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 3.  Aortic Atherosclerosis (ICD10-I70.0). Electronically Signed   By: Maudry Mayhew M.D.   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  He is status post left laparoscopic radical nephrectomy on 03/12/2022 under the care of Dr. Alvester Morin and the final pathology showed clear-cell  renal cell carcinoma nuclear grade 4 measuring 7.0 cm with tumor extension into the renal vein and renal sinus. The patient underwent treatment with Adjuvant immunotherapy with Keytruda 200 Mg IV every 3 weeks status post 17 cycles. The patient is currently on observation and he is feeling fine.    Stage Burns Renal Cell Carcinoma, status post left radical nephrectomy Stage Burns renal cell carcinoma treated with left radical nephrectomy and one year of Keytruda, discontinued September 2024. Currently asymptomatic with no new findings. Plan for continued observation with biannual follow-ups. - Schedule follow-up in six months - Order blood work and scan prior to next visit - Instruct patient to report new symptoms  Upper Respiratory Infection Persistent upper respiratory symptoms for two weeks, including non-productive cough. No chest pain, dyspnea,  fever, chills, nausea, or vomiting. Recently completed a five-day course of erythromycin. No significant weight loss. Physical exam and CT scan unremarkable. - Continue observation - Follow up with primary care physician if symptoms persist or worsen  General Health Maintenance No new health concerns aside from the upper respiratory infection. Regular follow-up and monitoring planned. - Continue routine health maintenance and screenings as per standard guidelines.   For the hypothyroidism, he will continue his current treatment with levothyroxine. He was advised to call immediately if he has any concerning symptoms in the interval. The patient voices understanding of current disease status and treatment options and is in agreement with the current care plan.  All questions were answered. The patient knows to call the clinic with any problems, questions or concerns. We can certainly see the patient much sooner if necessary.  The total time spent in the appointment was 20 minutes.  Disclaimer: This note was dictated with voice recognition software. Similar sounding words can inadvertently be transcribed and may not be corrected upon review.

## 2023-11-14 ENCOUNTER — Inpatient Hospital Stay (HOSPITAL_COMMUNITY)
Admission: EM | Admit: 2023-11-14 | Discharge: 2023-11-20 | DRG: 189 | Disposition: A | Discharge status: Home / Self Care | Payer: BC Managed Care – PPO | Attending: Family Medicine | Admitting: Family Medicine

## 2023-11-14 ENCOUNTER — Encounter (HOSPITAL_COMMUNITY): Payer: Self-pay | Admitting: Emergency Medicine

## 2023-11-14 ENCOUNTER — Other Ambulatory Visit: Payer: Self-pay

## 2023-11-14 ENCOUNTER — Emergency Department (HOSPITAL_COMMUNITY): Payer: BC Managed Care – PPO

## 2023-11-14 DIAGNOSIS — J189 Pneumonia, unspecified organism: Secondary | ICD-10-CM | POA: Diagnosis present

## 2023-11-14 DIAGNOSIS — Z85528 Personal history of other malignant neoplasm of kidney: Secondary | ICD-10-CM | POA: Diagnosis not present

## 2023-11-14 DIAGNOSIS — Z91030 Bee allergy status: Secondary | ICD-10-CM

## 2023-11-14 DIAGNOSIS — Z8249 Family history of ischemic heart disease and other diseases of the circulatory system: Secondary | ICD-10-CM | POA: Diagnosis not present

## 2023-11-14 DIAGNOSIS — I130 Hypertensive heart and chronic kidney disease with heart failure and stage 1 through stage 4 chronic kidney disease, or unspecified chronic kidney disease: Secondary | ICD-10-CM | POA: Diagnosis present

## 2023-11-14 DIAGNOSIS — I712 Thoracic aortic aneurysm, without rupture, unspecified: Secondary | ICD-10-CM | POA: Diagnosis present

## 2023-11-14 DIAGNOSIS — N179 Acute kidney failure, unspecified: Secondary | ICD-10-CM | POA: Diagnosis present

## 2023-11-14 DIAGNOSIS — Z7985 Long-term (current) use of injectable non-insulin antidiabetic drugs: Secondary | ICD-10-CM | POA: Diagnosis not present

## 2023-11-14 DIAGNOSIS — E039 Hypothyroidism, unspecified: Secondary | ICD-10-CM | POA: Diagnosis present

## 2023-11-14 DIAGNOSIS — Z79890 Hormone replacement therapy (postmenopausal): Secondary | ICD-10-CM | POA: Diagnosis not present

## 2023-11-14 DIAGNOSIS — J40 Bronchitis, not specified as acute or chronic: Secondary | ICD-10-CM | POA: Diagnosis present

## 2023-11-14 DIAGNOSIS — N1831 Chronic kidney disease, stage 3a: Secondary | ICD-10-CM | POA: Diagnosis present

## 2023-11-14 DIAGNOSIS — Z9226 Personal history of immune checkpoint inhibitor therapy: Secondary | ICD-10-CM

## 2023-11-14 DIAGNOSIS — E1122 Type 2 diabetes mellitus with diabetic chronic kidney disease: Secondary | ICD-10-CM | POA: Diagnosis present

## 2023-11-14 DIAGNOSIS — J4541 Moderate persistent asthma with (acute) exacerbation: Secondary | ICD-10-CM

## 2023-11-14 DIAGNOSIS — Z79899 Other long term (current) drug therapy: Secondary | ICD-10-CM

## 2023-11-14 DIAGNOSIS — J9601 Acute respiratory failure with hypoxia: Secondary | ICD-10-CM | POA: Diagnosis present

## 2023-11-14 DIAGNOSIS — E785 Hyperlipidemia, unspecified: Secondary | ICD-10-CM | POA: Diagnosis present

## 2023-11-14 DIAGNOSIS — E1159 Type 2 diabetes mellitus with other circulatory complications: Secondary | ICD-10-CM | POA: Diagnosis present

## 2023-11-14 DIAGNOSIS — C642 Malignant neoplasm of left kidney, except renal pelvis: Secondary | ICD-10-CM | POA: Diagnosis present

## 2023-11-14 DIAGNOSIS — Z1152 Encounter for screening for COVID-19: Secondary | ICD-10-CM

## 2023-11-14 DIAGNOSIS — J4489 Other specified chronic obstructive pulmonary disease: Secondary | ICD-10-CM | POA: Diagnosis present

## 2023-11-14 DIAGNOSIS — I350 Nonrheumatic aortic (valve) stenosis: Secondary | ICD-10-CM | POA: Diagnosis present

## 2023-11-14 DIAGNOSIS — K219 Gastro-esophageal reflux disease without esophagitis: Secondary | ICD-10-CM | POA: Diagnosis present

## 2023-11-14 DIAGNOSIS — J440 Chronic obstructive pulmonary disease with acute lower respiratory infection: Secondary | ICD-10-CM | POA: Diagnosis present

## 2023-11-14 DIAGNOSIS — D472 Monoclonal gammopathy: Secondary | ICD-10-CM | POA: Diagnosis present

## 2023-11-14 DIAGNOSIS — I152 Hypertension secondary to endocrine disorders: Secondary | ICD-10-CM | POA: Diagnosis present

## 2023-11-14 DIAGNOSIS — Z7984 Long term (current) use of oral hypoglycemic drugs: Secondary | ICD-10-CM | POA: Diagnosis not present

## 2023-11-14 DIAGNOSIS — E1169 Type 2 diabetes mellitus with other specified complication: Secondary | ICD-10-CM | POA: Diagnosis present

## 2023-11-14 DIAGNOSIS — I5032 Chronic diastolic (congestive) heart failure: Secondary | ICD-10-CM | POA: Diagnosis present

## 2023-11-14 DIAGNOSIS — E119 Type 2 diabetes mellitus without complications: Secondary | ICD-10-CM

## 2023-11-14 DIAGNOSIS — J441 Chronic obstructive pulmonary disease with (acute) exacerbation: Secondary | ICD-10-CM | POA: Diagnosis present

## 2023-11-14 DIAGNOSIS — Z905 Acquired absence of kidney: Secondary | ICD-10-CM | POA: Diagnosis not present

## 2023-11-14 DIAGNOSIS — Z833 Family history of diabetes mellitus: Secondary | ICD-10-CM

## 2023-11-14 DIAGNOSIS — I5031 Acute diastolic (congestive) heart failure: Secondary | ICD-10-CM | POA: Diagnosis not present

## 2023-11-14 DIAGNOSIS — Z720 Tobacco use: Secondary | ICD-10-CM

## 2023-11-14 LAB — CBC WITH DIFFERENTIAL/PLATELET
Abs Immature Granulocytes: 0.04 10*3/uL (ref 0.00–0.07)
Basophils Absolute: 0.2 10*3/uL — ABNORMAL HIGH (ref 0.0–0.1)
Basophils Relative: 2 %
Eosinophils Absolute: 5.1 10*3/uL — ABNORMAL HIGH (ref 0.0–0.5)
Eosinophils Relative: 37 %
HCT: 45.1 % (ref 39.0–52.0)
Hemoglobin: 14.8 g/dL (ref 13.0–17.0)
Immature Granulocytes: 0 %
Lymphocytes Relative: 12 %
Lymphs Abs: 1.6 10*3/uL (ref 0.7–4.0)
MCH: 31.7 pg (ref 26.0–34.0)
MCHC: 32.8 g/dL (ref 30.0–36.0)
MCV: 96.6 fL (ref 80.0–100.0)
Monocytes Absolute: 0.7 10*3/uL (ref 0.1–1.0)
Monocytes Relative: 5 %
Neutro Abs: 6.2 10*3/uL (ref 1.7–7.7)
Neutrophils Relative %: 44 %
Platelets: 256 10*3/uL (ref 150–400)
RBC: 4.67 MIL/uL (ref 4.22–5.81)
RDW: 12.4 % (ref 11.5–15.5)
WBC: 13.8 10*3/uL — ABNORMAL HIGH (ref 4.0–10.5)
nRBC: 0 % (ref 0.0–0.2)

## 2023-11-14 LAB — COMPREHENSIVE METABOLIC PANEL
ALT: 25 U/L (ref 0–44)
AST: 40 U/L (ref 15–41)
Albumin: 4.4 g/dL (ref 3.5–5.0)
Alkaline Phosphatase: 52 U/L (ref 38–126)
Anion gap: 10 (ref 5–15)
BUN: 15 mg/dL (ref 6–20)
CO2: 24 mmol/L (ref 22–32)
Calcium: 9.4 mg/dL (ref 8.9–10.3)
Chloride: 103 mmol/L (ref 98–111)
Creatinine, Ser: 1.36 mg/dL — ABNORMAL HIGH (ref 0.61–1.24)
GFR, Estimated: >60 mL/min (ref >60)
Glucose, Bld: 156 mg/dL — ABNORMAL HIGH (ref 70–99)
Potassium: 4.5 mmol/L (ref 3.5–5.1)
Sodium: 137 mmol/L (ref 135–145)
Total Bilirubin: 0.6 mg/dL (ref 0.0–1.2)
Total Protein: 7.5 g/dL (ref 6.5–8.1)

## 2023-11-14 LAB — RESP PANEL BY RT-PCR (RSV, FLU A&B, COVID)  RVPGX2
Influenza A by PCR: NEGATIVE
Influenza B by PCR: NEGATIVE
Resp Syncytial Virus by PCR: NEGATIVE
SARS Coronavirus 2 by RT PCR: NEGATIVE

## 2023-11-14 LAB — TROPONIN I (HIGH SENSITIVITY)
Troponin I (High Sensitivity): 8 ng/L (ref <18)
Troponin I (High Sensitivity): 8 ng/L (ref <18)

## 2023-11-14 LAB — BRAIN NATRIURETIC PEPTIDE: B Natriuretic Peptide: 30.6 pg/mL (ref 0.0–100.0)

## 2023-11-14 LAB — D-DIMER, QUANTITATIVE: D-Dimer, Quant: <0.27 ug{FEU}/mL (ref 0.00–0.50)

## 2023-11-14 MED ORDER — INSULIN ASPART 100 UNIT/ML IJ SOLN
0.0000 [IU] | Freq: Three times a day (TID) | INTRAMUSCULAR | Status: DC
  Administered 2023-11-15 (×2): 4 [IU] via SUBCUTANEOUS
  Administered 2023-11-15: 7 [IU] via SUBCUTANEOUS
  Administered 2023-11-16: 3 [IU] via SUBCUTANEOUS
  Administered 2023-11-16: 4 [IU] via SUBCUTANEOUS
  Administered 2023-11-16: 7 [IU] via SUBCUTANEOUS
  Administered 2023-11-17 (×2): 3 [IU] via SUBCUTANEOUS
  Administered 2023-11-17: 7 [IU] via SUBCUTANEOUS
  Administered 2023-11-18: 4 [IU] via SUBCUTANEOUS
  Administered 2023-11-18: 15 [IU] via SUBCUTANEOUS
  Administered 2023-11-19: 4 [IU] via SUBCUTANEOUS
  Administered 2023-11-20: 3 [IU] via SUBCUTANEOUS
  Administered 2023-11-20: 7 [IU] via SUBCUTANEOUS
  Filled 2023-11-14: qty 0.2

## 2023-11-14 MED ORDER — EZETIMIBE 10 MG PO TABS
10.0000 mg | ORAL_TABLET | Freq: Every day | ORAL | Status: DC
  Administered 2023-11-14 – 2023-11-20 (×7): 10 mg via ORAL
  Filled 2023-11-14 (×8): qty 1

## 2023-11-14 MED ORDER — FAMOTIDINE 20 MG PO TABS
20.0000 mg | ORAL_TABLET | Freq: Every evening | ORAL | Status: DC
  Administered 2023-11-14 – 2023-11-19 (×6): 20 mg via ORAL
  Filled 2023-11-14 (×6): qty 1

## 2023-11-14 MED ORDER — AMLODIPINE BESYLATE 10 MG PO TABS
10.0000 mg | ORAL_TABLET | Freq: Every day | ORAL | Status: DC
  Administered 2023-11-14 – 2023-11-20 (×7): 10 mg via ORAL
  Filled 2023-11-14 (×5): qty 1
  Filled 2023-11-14: qty 2
  Filled 2023-11-14: qty 1

## 2023-11-14 MED ORDER — BENZONATATE 100 MG PO CAPS
100.0000 mg | ORAL_CAPSULE | Freq: Once | ORAL | Status: AC
  Administered 2023-11-14: 100 mg via ORAL
  Filled 2023-11-14: qty 1

## 2023-11-14 MED ORDER — ENOXAPARIN SODIUM 40 MG/0.4ML IJ SOSY
40.0000 mg | PREFILLED_SYRINGE | INTRAMUSCULAR | Status: DC
  Administered 2023-11-15 – 2023-11-20 (×5): 40 mg via SUBCUTANEOUS
  Filled 2023-11-14 (×6): qty 0.4

## 2023-11-14 MED ORDER — IPRATROPIUM-ALBUTEROL 0.5-2.5 (3) MG/3ML IN SOLN
3.0000 mL | Freq: Four times a day (QID) | RESPIRATORY_TRACT | Status: DC
  Administered 2023-11-15 – 2023-11-16 (×6): 3 mL via RESPIRATORY_TRACT
  Filled 2023-11-14 (×6): qty 3

## 2023-11-14 MED ORDER — TAMSULOSIN HCL 0.4 MG PO CAPS
0.4000 mg | ORAL_CAPSULE | Freq: Every day | ORAL | Status: DC
  Administered 2023-11-14 – 2023-11-20 (×7): 0.4 mg via ORAL
  Filled 2023-11-14 (×7): qty 1

## 2023-11-14 MED ORDER — IPRATROPIUM-ALBUTEROL 0.5-2.5 (3) MG/3ML IN SOLN
3.0000 mL | Freq: Once | RESPIRATORY_TRACT | Status: AC
  Administered 2023-11-14: 3 mL via RESPIRATORY_TRACT
  Filled 2023-11-14: qty 3

## 2023-11-14 MED ORDER — LOSARTAN POTASSIUM 50 MG PO TABS
100.0000 mg | ORAL_TABLET | Freq: Every day | ORAL | Status: DC
  Administered 2023-11-14 – 2023-11-20 (×7): 100 mg via ORAL
  Filled 2023-11-14 (×7): qty 2

## 2023-11-14 MED ORDER — METHYLPREDNISOLONE SODIUM SUCC 125 MG IJ SOLR
125.0000 mg | Freq: Once | INTRAMUSCULAR | Status: AC
  Administered 2023-11-14: 125 mg via INTRAVENOUS
  Filled 2023-11-14: qty 2

## 2023-11-14 MED ORDER — PREDNISONE 20 MG PO TABS
40.0000 mg | ORAL_TABLET | Freq: Every day | ORAL | Status: AC
  Administered 2023-11-16 – 2023-11-19 (×4): 40 mg via ORAL
  Filled 2023-11-14 (×4): qty 2

## 2023-11-14 MED ORDER — ALBUTEROL SULFATE (2.5 MG/3ML) 0.083% IN NEBU
2.5000 mg | INHALATION_SOLUTION | RESPIRATORY_TRACT | Status: DC | PRN
  Administered 2023-11-16 – 2023-11-19 (×4): 2.5 mg via RESPIRATORY_TRACT
  Filled 2023-11-14 (×5): qty 3

## 2023-11-14 MED ORDER — EMPAGLIFLOZIN 25 MG PO TABS
25.0000 mg | ORAL_TABLET | Freq: Every day | ORAL | Status: DC
  Administered 2023-11-15 – 2023-11-20 (×6): 25 mg via ORAL
  Filled 2023-11-14 (×6): qty 1

## 2023-11-14 MED ORDER — INSULIN ASPART 100 UNIT/ML IJ SOLN
0.0000 [IU] | Freq: Every day | INTRAMUSCULAR | Status: DC
  Administered 2023-11-15: 3 [IU] via SUBCUTANEOUS
  Administered 2023-11-15 – 2023-11-17 (×3): 4 [IU] via SUBCUTANEOUS
  Administered 2023-11-18: 3 [IU] via SUBCUTANEOUS
  Filled 2023-11-14: qty 0.05

## 2023-11-14 MED ORDER — METHYLPREDNISOLONE SODIUM SUCC 40 MG IJ SOLR
40.0000 mg | Freq: Two times a day (BID) | INTRAMUSCULAR | Status: AC
  Administered 2023-11-15 (×2): 40 mg via INTRAVENOUS
  Filled 2023-11-14 (×2): qty 1

## 2023-11-14 MED ORDER — SODIUM CHLORIDE 0.9 % IV SOLN
1.0000 g | INTRAVENOUS | Status: AC
  Administered 2023-11-15 – 2023-11-18 (×5): 1 g via INTRAVENOUS
  Filled 2023-11-14 (×5): qty 10

## 2023-11-14 MED ORDER — ATORVASTATIN CALCIUM 40 MG PO TABS
80.0000 mg | ORAL_TABLET | Freq: Every day | ORAL | Status: DC
  Administered 2023-11-14 – 2023-11-20 (×7): 80 mg via ORAL
  Filled 2023-11-14 (×7): qty 2

## 2023-11-14 NOTE — H&P (Signed)
 History and Physical    Patient: Russell Burns ZOX:[id number] DOB: 10/15/1965 DOA: 11/14/2023 DOS: the patient was seen and examined on 11/14/2023 PCP: [name], MD  Patient coming from: Home  Chief Complaint:  Chief Complaint  Patient presents with   Shortness of Breath   HPI: Russell Burns is a 58 y.o. 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  Patient has had symptoms ongoing now for 2 weeks.  He was given 5 days of erythromycin in the outpatient setting.  Continues to have significant cough.  Some wheezing.  Unable to sleep.  Patient denied smoking.  He does use and chews tobacco.  He was being seen by pulmonologist but last visit was months ago.  Patient is currently requiring 6 L of oxygen in the ER.  He is tachypneic.  He does have some mild expiratory wheezing but not moving air much.  Appears to be having acute exacerbation of his asthma.  He uses albuterol inhaler and nebulizer constantly both at work and at home.  He has used it but no relief.  We are admitting the patient to the hospital with acute hypoxic respiratory failure most likely due to acute exacerbation of asthma.  Review of Systems: As mentioned in the history of present illness. All other systems reviewed and are negative. Past Medical History:  Diagnosis Date   Allergy    Aortic stenosis    Arthritis    Blood transfusion without reported diagnosis    Cancer (HCC)    Chronic kidney disease    Diabetes mellitus without complication (HCC)    FH: thoracic aneurysm    Hyperlipidemia    Hypertension    Obesity    Past Surgical History:  Procedure  Laterality Date   CYSTOSCOPY WITH URETEROSCOPY AND STENT PLACEMENT Left 02/12/2022   Procedure: CYSTOSCOPY WITH LEFT RETROGRADE PYELOGRAM, DIAGNOSTIC URETEROSCOPY AND STENT PLACEMENT;  Surgeon: Crista Elliot, MD;  Location: [facility];  Service: Urology;  Laterality: Left;   LAPAROSCOPIC NEPHRECTOMY, HAND ASSISTED Left 03/12/2022   Procedure: LEFT HAND ASSISTED LAPAROSCOPIC NEPHRECTOMY;  Surgeon: Crista Elliot, MD;  Location: [facility];  Service: Urology;  Laterality: Left;  NEED 2.5 HRS FOR THIS CASE   TONSILLECTOMY     WISDOM TOOTH EXTRACTION     Social History:  reports that he has never smoked. His smokeless tobacco use includes chew. He reports current alcohol use. He reports that he does not use drugs.  Allergies  Allergen Reactions   Bee Venom Anaphylaxis   Cat Dander Itching, Swelling and Other (See Comments)    Runny nose, itchy/puffy eyes,     Family History  Problem Relation Age of Onset   CAD Other    Diabetes Mellitus II Other    Hypertension Other    Heart failure Other    Hypertension Mother    Heart attack Father        36, 70 at both ages   Diabetes Mellitus II Father        insulin dependent   Aneurysm Maternal Uncle        aortic   Heart attack Paternal Uncle 60  minor   Hypertension Paternal Uncle    Heart attack Maternal Grandmother 82       massive   Aneurysm Maternal Grandfather        abdominal   Heart attack Paternal Grandfather 61   Aneurysm Cousin 44       aortic   Colon polyps Neg Hx    Esophageal cancer Neg Hx    Rectal cancer Neg Hx    Stomach cancer Neg Hx     Prior to Admission medications   Medication Sig Start Date End Date Taking? Authorizing Provider  acetaminophen (TYLENOL) 500 MG tablet Take 1,000 mg by mouth in the morning.   Yes [provider]  albuterol (PROVENTIL) (2.5 MG/3ML) 0.083% nebulizer solution INHALE 3 ML BY NEBULIZATION EVERY 6 HOURS AS NEEDED FOR WHEEZING OR SHORTNESS OF BREATH Patient taking  differently: Take 2.5 mg by nebulization every 6 (six) hours as needed for wheezing or shortness of breath. 11/12/23  Yes Cobb, Ruby Cola, NP  albuterol (VENTOLIN HFA) 108 (90 Base) MCG/ACT inhaler Inhale 2 puffs into the lungs every 6 (six) hours as needed for wheezing or shortness of breath. 12/11/21  Yes [provider]  amLODipine (NORVASC) 10 MG tablet Take 10 mg by mouth daily. 08/01/20  Yes [provider]  atorvastatin (LIPITOR) 80 MG tablet Take 80 mg by mouth daily.   Yes [provider]  BENADRYL ALLERGY 25 MG tablet Take 50 mg by mouth 3 (three) times daily.   Yes [provider]  benzonatate (TESSALON) 200 MG capsule Take 1 capsule (200 mg total) by mouth 3 (three) times daily as needed. Patient taking differently: Take 200 mg by mouth 3 (three) times daily as needed for cough. 07/02/23  Yes Kathlen Mody, MD  Budeson-Glycopyrrol-Formoterol (BREZTRI AEROSPHERE) 160-9-4.8 MCG/ACT AERO Inhale 2 puffs into the lungs 2 (two) times daily. Take 2 puffs first thing in am and then another 2 puffs about 12 hours later. 12/12/22  Yes Nyoka Cowden, MD  cetirizine (ZYRTEC) 10 MG tablet Take 10 mg by mouth 2 (two) times daily.   Yes [provider]  DELSYM 30 MG/5ML liquid Take 15-30 mg by mouth in the morning and at bedtime.   Yes [provider]  Dextromethorphan-guaiFENesin (MUCINEX DM MAXIMUM STRENGTH) 60-1200 MG TB12 Take by mouth.   Yes [provider]  EPINEPHrine 0.3 mg/0.3 mL IJ SOAJ injection Inject 0.3 mg into the muscle as needed for anaphylaxis.   Yes [provider]  ezetimibe (ZETIA) 10 MG tablet Take 10 mg by mouth daily. 08/01/20  Yes [provider]  fluticasone (FLONASE) 50 MCG/ACT nasal spray Place 2 sprays into both nostrils in the morning and at bedtime.   Yes [provider]  ipratropium-albuterol (DUONEB) 0.5-2.5 (3) MG/3ML SOLN Take 3 mLs by nebulization every 3 (three) hours as needed  (shortness of breath or wheezing). 11/22/22  Yes [provider]  JARDIANCE 25 MG TABS tablet Take 25 mg by mouth daily. 07/10/20  Yes [provider]  levothyroxine (SYNTHROID) 25 MCG tablet Take 1 tablet (25 mcg total) by mouth daily before breakfast. 12/07/22  Yes Heilingoetter, Cassandra L, PA-C  losartan (COZAAR) 100 MG tablet Take 100 mg by mouth daily.   Yes [provider]  metFORMIN (GLUCOPHAGE) 1000 MG tablet Take 1,000 mg by mouth in the morning and at bedtime.   Yes [provider]  nystatin (MYCOSTATIN) 100000 UNIT/ML suspension Take 5 mLs by mouth 4 (four) times daily as  needed (as directed for thrush).   Yes [provider]  promethazine-dextromethorphan (PROMETHAZINE-DM) 6.25-15 MG/5ML syrup Take 10 mLs by mouth at bedtime as needed for cough.   Yes [provider]  tamsulosin (FLOMAX) 0.4 MG CAPS capsule Take 1 capsule (0.4 mg total) by mouth daily. 02/12/22  Yes Crista Elliot, MD  tirzepatide [facility]) 10 MG/0.5ML Pen Inject 10 mg into the skin once a week. Patient taking differently: Inject 10 mg into the skin every Friday. 11/14/21  Yes   Dupilumab (DUPIXENT) 200 MG/1. SOAJ Inject 200 mg into the skin every 14 (fourteen) days. Patient not taking: Reported on 11/14/2023 10/09/23   Audie Box L, DO  famotidine (PEPCID) 20 MG tablet TAKE 1 TABLET BY MOUTH EVERY DAY AFTER SUPPER Patient taking differently: Take 20 mg by mouth every evening. 06/26/23  Yes Nyoka Cowden, MD  [facility] VERIO test strip 1 each by Other route 5 (five) times daily. 10/31/22   [provider]  pantoprazole (PROTONIX) 40 MG tablet Take 1 tablet (40 mg total) by mouth 2 (two) times daily. Patient not taking: Reported on 11/14/2023 02/20/23   Azucena Fallen, MD  prochlorperazine (COMPAZINE) 10 MG tablet Take 1 tablet (10 mg total) by mouth every 6 (six) hours as needed. Patient taking differently: Take 10 mg by mouth every 6 (six) hours  as needed for nausea or vomiting. 12/27/22  Yes Si Gaul, MD  REPATHA 140 MG/ML SOSY Inject 140 mg into the skin every 14 (fourteen) days.   Yes [provider]    Physical Exam: Vitals:   11/14/23 1610 11/14/23 1959  BP: (!) 150/84 (!) 141/96  Pulse: 86 86  Resp: (!) 22 (!) 22  Temp: 98.7 F (37.1 C) (!) 97.5 F (36.4 C)  TempSrc: Oral Oral  SpO2: 93% 95%   Constitutional: Acutely ill looking, no distress , calm, comfortable Eyes: PERRL, lids and conjunctivae normal ENMT: Mucous membranes are moist. Posterior pharynx clear of any exudate or lesions.Normal dentition.  Neck: normal, supple, no masses, no thyromegaly Respiratory: Decreased air entry with some mild expiratory wheezing bilaterally, no crackles. Normal respiratory effort. No accessory muscle use.  Cardiovascular: Regular rate and rhythm, no murmurs / rubs / gallops. No extremity edema. 2+ pedal pulses. No carotid bruits.  Abdomen: no tenderness, no masses palpated. No hepatosplenomegaly. Bowel sounds positive.  Musculoskeletal: Good range of motion, no joint swelling or tenderness, Skin: no rashes, lesions, ulcers. No induration Neurologic: CN 2-12 grossly intact. Sensation intact, DTR normal. Strength 5/5 in all 4.  Psychiatric: Normal judgment and insight. Alert and oriented x 3.  Anxious mood  Data Reviewed:  Temperature 97.5, blood pressure 141/96, respirate of 22 and oxygen sat 95% on high flow nasal cannula 6 L white count is 13.8.  Creatinine 1.36 and glucose 156, D-dimer is less than 0.27 acute viral screen is negative for influenza RSV and COVID-19 chest x-ray showed no acute findings  Assessment and Plan:  #1 acute hypoxic respiratory failure: Most likely secondary to asthmatic bronchitis with flareup.  Patient has had previous testing showing that he is allergic to cats dander but has not been exposed to anything lately.  He is also noted that when he gets exposed to the cold air, he started  having shortness of breath.  There is suspicion for possible COPD as well although he chews tobacco now does not smoke.  Patient will be admitted.  Outputting on the COPD/asthma pathway.  IV steroid, nebulizer, I will add  antibiotics since he has chronic symptoms.  Aggressive antitussives.  Titrate oxygen down.  #2 acute asthmatic bronchitis attack: Continue as per above.  #3 type 2 diabetes: Non-insulin-dependent.  Will continue with sliding scale insulin in the hospital.  Patient will be on steroids so he is likely to have hyperglycemia  #4 essential hypertension: Patient on amlodipine, also losartan.  Continue with that.  #5 hyperlipidemia: Continue with statin from home.  Also on ezetimibe.  #6 GERD: Continue with PPIs  #7 chronic kidney disease stage Burns: Status post radical nephrectomy.  May have acute component.  Hydrate and monitor  #8 chronic diastolic CHF: Appears compensated.  Diastolic in nature.    Advance Care Planning:   Code Status: Full Code   Consults: None  Family Communication: Wife at bedside  Severity of Illness: The appropriate patient status for this patient is INPATIENT. Inpatient status is judged to be reasonable and necessary in order to provide the required intensity of service to ensure the patient's safety. The patient's presenting symptoms, physical exam findings, and initial radiographic and laboratory data in the context of their chronic comorbidities is felt to place them at high risk for further clinical deterioration. Furthermore, it is not anticipated that the patient will be medically stable for discharge from the hospital within 2 midnights of admission.   * I certify that at the point of admission it is my clinical judgment that the patient will require inpatient hospital care spanning beyond 2 midnights from the point of admission due to high intensity of service, high risk for further deterioration and high frequency of surveillance  required.*  AuthorLonia Blood, MD 11/14/2023 9:00 PM  For on call review [email or web address].

## 2023-11-14 NOTE — ED Provider Notes (Signed)
 [place] EMERGENCY DEPARTMENT AT [facility] Provider Note   CSN: [id number] Arrival date & time: 11/14/23  1559     History  Chief Complaint  Patient presents with   Shortness of Breath    Russell Burns is a 58 y.o. male.  With a history of renal carcinoma, diastolic heart failure, dyspnea on exertion, asthmatic bronchitis, eosinophilia and type 2 diabetes who presents to the ED for shortness of breath.  He reports ongoing dry cough and shortness of breath over the last 2 weeks.  Coughing is very severe and has made it hard to sleep at night.  He has a pulse oximetry device at home and has dropped to the mid 80s.  No home O2.  He has had similar episodes of dry coughing that occur every several months or so and have required hospitalization.  He has been evaluated by 2 pulmonologists.  No home O2.  Denies fevers, chills, chest pain, nausea, vomiting and peripheral edema.  History of thoracic aortic aneurysm that is closely followed and CT taken earlier this month appeared stable   Shortness of Breath      Home Medications Prior to Admission medications   Medication Sig Start Date End Date Taking? Authorizing Provider  albuterol (PROVENTIL) (2.5 MG/3ML) 0.083% nebulizer solution INHALE 3 ML BY NEBULIZATION EVERY 6 HOURS AS NEEDED FOR WHEEZING OR SHORTNESS OF BREATH Patient taking differently: Take 2.5 mg by nebulization every 6 (six) hours as needed for wheezing or shortness of breath. 11/12/23  Yes Cobb, Ruby Cola, NP  albuterol (VENTOLIN HFA) 108 (90 Base) MCG/ACT inhaler Inhale 2 puffs into the lungs every 6 (six) hours as needed for wheezing or shortness of breath. 12/11/21  Yes [provider]  Budeson-Glycopyrrol-Formoterol (BREZTRI AEROSPHERE) 160-9-4.8 MCG/ACT AERO Inhale 2 puffs into the lungs 2 (two) times daily. Take 2 puffs first thing in am and then another 2 puffs about 12 hours later. 12/12/22  Yes Nyoka Cowden, MD  ezetimibe (ZETIA) 10 MG  tablet Take 10 mg by mouth daily. 08/01/20  Yes [provider]  fluticasone (FLONASE) 50 MCG/ACT nasal spray Place 2 sprays into both nostrils in the morning and at bedtime.   Yes [provider]  levothyroxine (SYNTHROID) 25 MCG tablet Take 1 tablet (25 mcg total) by mouth daily before breakfast. 12/07/22  Yes Heilingoetter, Cassandra L, PA-C  losartan (COZAAR) 100 MG tablet Take 100 mg by mouth daily.   Yes [provider]  tamsulosin (FLOMAX) 0.4 MG CAPS capsule Take 1 capsule (0.4 mg total) by mouth daily. 02/12/22  Yes Crista Elliot, MD  tirzepatide [facility]) 10 MG/0.5ML Pen Inject 10 mg into the skin once a week. Patient taking differently: Inject 10 mg into the skin every Friday. 11/14/21  Yes   acetaminophen (TYLENOL) 500 MG tablet Take 1,000 mg by mouth in the morning.    [provider]  amLODipine (NORVASC) 10 MG tablet Take 10 mg by mouth daily. 08/01/20   [provider]  atorvastatin (LIPITOR) 80 MG tablet Take 80 mg by mouth daily.    [provider]  benzonatate (TESSALON) 200 MG capsule Take 1 capsule (200 mg total) by mouth 3 (three) times daily as needed. 07/02/23   Kathlen Mody, MD  cetirizine (ZYRTEC) 10 MG tablet Take 10 mg by mouth 2 (two) times daily.    [provider]  Dupilumab (DUPIXENT) 200 MG/1. SOAJ Inject 200 mg into the skin every 14 (fourteen) days. Patient not  taking: Reported on 11/14/2023 10/09/23   Audie Box L, DO  EPINEPHrine 0.3 mg/0.3 mL IJ SOAJ injection Inject 0.3 mg into the muscle as needed for anaphylaxis.    [provider]  famotidine (PEPCID) 20 MG tablet TAKE 1 TABLET BY MOUTH EVERY DAY AFTER SUPPER 06/26/23   Nyoka Cowden, MD  ipratropium-albuterol (DUONEB) 0.5-2.5 (3) MG/3ML SOLN Take 3 mLs by nebulization every 6 (six) hours as needed (sob/wheezing). 11/22/22   [provider]  JARDIANCE 25 MG TABS tablet Take 25 mg by mouth daily. 07/10/20   [provider]  metFORMIN (GLUCOPHAGE) 1000 MG tablet Take 1,000 mg by mouth 2 (two) times daily with a meal.    [provider]  ONETOUCH VERIO test strip 1 each by Other route 5 (five) times daily. 10/31/22   [provider]  pantoprazole (PROTONIX) 40 MG tablet Take 1 tablet (40 mg total) by mouth 2 (two) times daily. Patient not taking: Reported on 11/14/2023 02/20/23   Azucena Fallen, MD  prochlorperazine (COMPAZINE) 10 MG tablet Take 1 tablet (10 mg total) by mouth every 6 (six) hours as needed. Patient taking differently: Take 10 mg by mouth every 6 (six) hours as needed for nausea or vomiting. 12/27/22   Si Gaul, MD  REPATHA 140 MG/ML SOSY Inject 140 mg into the skin every 14 (fourteen) days.    [provider]      Allergies    Bee venom, Cat dander, and Other    Review of Systems   Review of Systems  Respiratory:  Positive for shortness of breath.     Physical Exam Updated Vital Signs BP (!) 141/96   Pulse 86   Temp (!) 97.5 F (36.4 C) (Oral)   Resp (!) 22   SpO2 95%  Physical Exam Vitals and nursing note reviewed.  HENT:     Head: Normocephalic and atraumatic.  Eyes:     Pupils: Pupils are equal, round, and reactive to light.  Cardiovascular:     Rate and Rhythm: Normal rate and regular rhythm.  Pulmonary:     Effort: Pulmonary effort is normal. Tachypnea present. No respiratory distress.     Breath sounds: No stridor. Examination of the right-upper field reveals wheezing. Examination of the left-upper field reveals wheezing. Wheezing present.     Comments: Severe dry cough on exam Abdominal:     Palpations: Abdomen is soft.     Tenderness: There is no abdominal tenderness.  Skin:    General: Skin is warm and dry.  Neurological:     Mental Status: He is alert.  Psychiatric:        Mood and Affect: Mood normal.     ED Results / Procedures / Treatments   Labs (all labs ordered are listed, but only abnormal results are  displayed) Labs Reviewed  CBC WITH DIFFERENTIAL/PLATELET - Abnormal; Notable for the following components:      Result Value   WBC 13.8 (*)    Eosinophils Absolute 5.1 (*)    Basophils Absolute 0.2 (*)    All other components within normal limits  COMPREHENSIVE METABOLIC PANEL - Abnormal; Notable for the following components:   Glucose, Bld 156 (*)    Creatinine, Ser 1.36 (*)    All other components within normal limits  RESP PANEL BY RT-PCR (RSV, FLU A&B, COVID)  RVPGX2  BRAIN NATRIURETIC PEPTIDE  D-DIMER, QUANTITATIVE  TROPONIN I (HIGH SENSITIVITY)  TROPONIN I (HIGH SENSITIVITY)    EKG EKG  Interpretation Date/Time:  Thursday November 14 2023 16:09:17 EST Ventricular Rate:  86 PR Interval:  183 QRS Duration:  103 QT Interval:  371 QTC Calculation: 444 R Axis:   -32  Text Interpretation: Sinus rhythm Left axis deviation Abnormal R-wave progression, early transition Confirmed by Beckey Downing [phone]) on 11/14/2023 4:12:32 PM  Radiology DG Chest Port 1 View Result Date: 11/14/2023 CLINICAL DATA:  Two-week history of cough and shortness of breath EXAM: PORTABLE CHEST 1 VIEW COMPARISON:  Chest radiograph dated 06/28/2023 FINDINGS: Normal lung volumes. No focal consolidations. No pleural effusion or pneumothorax. The heart size and mediastinal contours are within normal limits. No acute osseous abnormality. IMPRESSION: No acute disease. Electronically Signed   By: Agustin Cree M.D.   On: 11/14/2023 16:42    Procedures .Critical Care  Performed by: Royanne Foots, DO Authorized by: Royanne Foots, DO   Critical care provider statement:    Critical care time (minutes):  35   Critical care was necessary to treat or prevent imminent or life-threatening deterioration of the following conditions:  Respiratory failure   Critical care was time spent personally by me on the following activities:  Development of treatment plan with patient or surrogate, discussions with consultants,  evaluation of patient's response to treatment, examination of patient, ordering and review of laboratory studies, ordering and review of radiographic studies, ordering and performing treatments and interventions, pulse oximetry, re-evaluation of patient's condition and review of old charts   I assumed direction of critical care for this patient from another provider in my specialty: no     Care discussed with: admitting provider   Comments:     Discussed with admitting hospitalist     Medications Ordered in ED Medications  ipratropium-albuterol (DUONEB) 0.5-2.5 (3) MG/3ML nebulizer solution 3 mL (3 mLs Nebulization Given 11/14/23 1749)  methylPREDNISolone sodium succinate (SOLU-MEDROL) 125 mg/2 mL injection 125 mg (125 mg Intravenous Given 11/14/23 1749)  benzonatate (TESSALON) capsule 100 mg (100 mg Oral Given 11/14/23 2001)    ED Course/ Medical Decision Making/ A&P Clinical Course as of 11/14/23 2046  Thu Nov 14, 2023  2044 No focal consolidation on chest x-ray.  COVID influenza RSV all negative.  D-dimer not consistent with PE.  High sensitive troponin initial value was 8 with delta of 8.  Suspicion for ACS at this time.  No evidence of dysrhythmia on EKG.  Patient reports some improvement after DuoNeb treatment and steroids here.  He still requires supplemental oxygen on 6 L nasal cannula.  No home O2.  Will need to come in with new oxygen requirement.  Most likely etiology would be another episode of bronchitis at this time.  Discussed with admitting hospitalist accept patient for admission [MP]    Clinical Course User Index [MP] Royanne Foots, DO                                 Medical Decision Making 58 year old male with history as above including multiple episodes of bronchitis requiring hospitalization.  2 weeks of ongoing dry cough shortness of breath and notable hypoxemia at home.  No home O2.  Some wheezing on exam.  Has been using nebulized albuterol treatments every hour at  home with little relief.  No fevers or chest pain.  Differential diagnosis includes bronchitis, acute respiratory viral illness, pneumonia, pulmonary embolism, dysrhythmia and ACS.  He does have a history of cancer and does not anticoagulation.  Will start with D-dimer and obtain CTA of the chest if elevated.  Will provide DuoNeb treatment and steroids here to see if that helps with the wheezing.  Supplemental 2 L O2 oxygen via nasal cannula   Amount and/or Complexity of Data Reviewed Labs: ordered. Radiology: ordered.  Risk Prescription drug management. Decision regarding hospitalization.           Final Clinical Impression(s) / ED Diagnoses Final diagnoses:  Bronchitis  Acute hypoxemic respiratory failure [facility])    Rx / DC Orders ED Discharge Orders     None         Royanne Foots, DO 11/14/23 2046

## 2023-11-14 NOTE — ED Triage Notes (Signed)
 Pt reports shob and non productive cough x 2 weeks. Pt and wife report recurrent breathing problems that seem to occur every 4 months or so.  Some question of CHF diagnosis but states his PCP does not think that it is.  No swelling to extremities.  Pt does report an "ascending aortic aneurysm" that is being monitored.  Pt is orthopneic and has DOE.  Appears in distress at time of triage.

## 2023-11-14 NOTE — ED Notes (Signed)
 Pt sating at 88 on 6L [place] changed over to 6L HFNC

## 2023-11-15 ENCOUNTER — Inpatient Hospital Stay (HOSPITAL_COMMUNITY): Payer: BC Managed Care – PPO

## 2023-11-15 ENCOUNTER — Other Ambulatory Visit: Payer: Self-pay

## 2023-11-15 DIAGNOSIS — I5031 Acute diastolic (congestive) heart failure: Secondary | ICD-10-CM | POA: Diagnosis not present

## 2023-11-15 DIAGNOSIS — J9601 Acute respiratory failure with hypoxia: Secondary | ICD-10-CM | POA: Diagnosis not present

## 2023-11-15 LAB — COMPREHENSIVE METABOLIC PANEL
ALT: 25 U/L (ref 0–44)
AST: 43 U/L — ABNORMAL HIGH (ref 15–41)
Albumin: 4.6 g/dL (ref 3.5–5.0)
Alkaline Phosphatase: 55 U/L (ref 38–126)
Anion gap: 15 (ref 5–15)
BUN: 17 mg/dL (ref 6–20)
CO2: 21 mmol/L — ABNORMAL LOW (ref 22–32)
Calcium: 9.1 mg/dL (ref 8.9–10.3)
Chloride: 98 mmol/L (ref 98–111)
Creatinine, Ser: 1.36 mg/dL — ABNORMAL HIGH (ref 0.61–1.24)
GFR, Estimated: >60 mL/min (ref >60)
Glucose, Bld: 236 mg/dL — ABNORMAL HIGH (ref 70–99)
Potassium: 5 mmol/L (ref 3.5–5.1)
Sodium: 134 mmol/L — ABNORMAL LOW (ref 135–145)
Total Bilirubin: 0.8 mg/dL (ref 0.0–1.2)
Total Protein: 8.1 g/dL (ref 6.5–8.1)

## 2023-11-15 LAB — HIV ANTIBODY (ROUTINE TESTING W REFLEX): HIV Screen 4th Generation wRfx: NONREACTIVE

## 2023-11-15 LAB — GLUCOSE, CAPILLARY
Glucose-Capillary: 156 mg/dL — ABNORMAL HIGH (ref 70–99)
Glucose-Capillary: 168 mg/dL — ABNORMAL HIGH (ref 70–99)
Glucose-Capillary: 191 mg/dL — ABNORMAL HIGH (ref 70–99)
Glucose-Capillary: 220 mg/dL — ABNORMAL HIGH (ref 70–99)
Glucose-Capillary: 269 mg/dL — ABNORMAL HIGH (ref 70–99)

## 2023-11-15 LAB — CBC
HCT: 47.5 % (ref 39.0–52.0)
Hemoglobin: 15.3 g/dL (ref 13.0–17.0)
MCH: 31.5 pg (ref 26.0–34.0)
MCHC: 32.2 g/dL (ref 30.0–36.0)
MCV: 97.9 fL (ref 80.0–100.0)
Platelets: 265 10*3/uL (ref 150–400)
RBC: 4.85 MIL/uL (ref 4.22–5.81)
RDW: 12.3 % (ref 11.5–15.5)
WBC: 10.1 10*3/uL (ref 4.0–10.5)
nRBC: 0 % (ref 0.0–0.2)

## 2023-11-15 LAB — ECHOCARDIOGRAM COMPLETE
AR max vel: 0.61 cm2
AV Area VTI: 0.56 cm2
AV Area mean vel: 0.57 cm2
AV Mean grad: 41.4 mm[Hg]
AV Peak grad: 67.7 mm[Hg]
Ao pk vel: 4.11 m/s
Area-P 1/2: 3.48 cm2
Calc EF: 47.8 %
S' Lateral: 3.4 cm
Single Plane A2C EF: 51.1 %
Single Plane A4C EF: 46.8 %

## 2023-11-15 LAB — HEMOGLOBIN A1C
Hgb A1c MFr Bld: 6.5 % — ABNORMAL HIGH (ref 4.8–5.6)
Mean Plasma Glucose: 139.85 mg/dL

## 2023-11-15 MED ORDER — HYDROCODONE BIT-HOMATROP MBR 5-1.5 MG/5ML PO SOLN
5.0000 mL | Freq: Four times a day (QID) | ORAL | Status: AC | PRN
  Administered 2023-11-15 – 2023-11-17 (×4): 5 mL via ORAL
  Filled 2023-11-15 (×4): qty 5

## 2023-11-15 MED ORDER — PERFLUTREN LIPID MICROSPHERE
1.0000 mL | INTRAVENOUS | Status: AC | PRN
  Administered 2023-11-15: 1 mL via INTRAVENOUS

## 2023-11-15 MED ORDER — GUAIFENESIN-DM 100-10 MG/5ML PO SYRP
5.0000 mL | ORAL_SOLUTION | ORAL | Status: DC | PRN
  Administered 2023-11-15 – 2023-11-16 (×6): 5 mL via ORAL
  Filled 2023-11-15 (×6): qty 5

## 2023-11-15 MED ORDER — AZITHROMYCIN 250 MG PO TABS
500.0000 mg | ORAL_TABLET | Freq: Every day | ORAL | Status: AC
  Administered 2023-11-15 – 2023-11-19 (×5): 500 mg via ORAL
  Filled 2023-11-15 (×5): qty 2

## 2023-11-15 NOTE — Inpatient Diabetes Management (Signed)
 Inpatient Diabetes Program Recommendations  AACE/ADA: New Consensus Statement on Inpatient Glycemic Control (2015)  Target Ranges:  Prepandial:   less than 140 mg/dL      Peak postprandial:   less than 180 mg/dL (1-2 hours)      Critically ill patients:  140 - 180 mg/dL   Lab Results  Component Value Date   GLUCAP 191 (H) 11/15/2023   HGBA1C 6.5 (H) 11/15/2023    Review of Glycemic Control  Latest Reference Range & Units 11/14/23 23:59 11/15/23 07:41  Glucose-Capillary 70 - 99 mg/dL 914 (H) 782 (H)   Diabetes history: DM  Outpatient Diabetes medications:  Jardiance 25 mg daily Metformin 1000 mg bid Current orders for Inpatient glycemic control:  Novolog 0-20 units tid with meals and HS Solumedrol 40 mg IV q 12 hours Jardiance 25 mg daily  Inpatient Diabetes Program Recommendations:    Agree with current orders.  A1C indicates good control of DM prior to admit.  Likely elevated due to steroids.   Thanks,  Lorenza Cambridge, RN, BC-ADM Inpatient Diabetes Coordinator Pager [phone]  (8a-5p)

## 2023-11-15 NOTE — Evaluation (Signed)
 Physical Therapy One Time Evaluation Patient Details Name: Russell Burns MRN: [id number] DOB: 1965/11/16 Today's Date: 11/15/2023  History of Present Illness  58 y.o. male presented to the ER with shortness of breath and persistent cough amd admitted 11/14/23 for acute hypoxic respiratory failure: Most likely secondary to asthmatic bronchitis with flareup.  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 All education has been completed and the patient has no further questions.  Pt mobilizing well without assistive device.  Pt ambulated 800 ft on room air and SPO2 90-92%.  Pt on 5L O2 [place] on arrival to room so decreased to 2L O2 and SPO2 94%.  Discussed SpO2 and oxygen with pt, and he reports he monitors SPO2 at work (long distance walks required) with pulse oximeter.  He states sometimes SpO2 drops to 88%.  Pt plans to f/u with pulmonologist upon d/c.  Also recommended discussing if/when cardiopulmonary rehab may be appropriate at f/u visit with MD. No current PT or DME needs identified at this time. PT is signing off. Thank you for this referral.         If plan is discharge home, recommend the following:     Can travel by private vehicle        Equipment Recommendations None recommended by PT  Recommendations for Other Services       Functional Status Assessment Patient has not had a recent decline in their functional status     Precautions / Restrictions Precautions Precautions: None      Mobility  Bed Mobility               General bed mobility comments: pt in recliner on arrival     Transfers Overall transfer level: Independent                      Ambulation/Gait Ambulation/Gait assistance: Independent Gait Distance (Feet): 800 Feet Assistive device: None Gait Pattern/deviations: WFL(Within Functional Limits)       General Gait Details: pt able to walk and talk, SpO2 mostly 90-92% on room air, pt reports mild dyspnea but not affecting distance  Stairs            Wheelchair Mobility     Tilt Bed    Modified Rankin (Stroke Patients Only)       Balance Overall balance assessment: No apparent balance deficits (not formally assessed)                                           Pertinent Vitals/Pain Pain Assessment Pain Assessment: No/denies pain    Home Living Family/patient expects to be discharged to:: Private residence Living Arrangements: Spouse/significant other   Type of Home: House       Alternate Level Stairs-Number of Steps: split level 5-6 to each floor Home Layout: Multi-level Home Equipment: None      Prior Function Prior Level of Function : Independent/Modified Independent;Working/employed                     Extremity/Trunk Assessment  Upper Extremity Assessment Upper Extremity Assessment: Overall WFL for tasks assessed    Lower Extremity Assessment Lower Extremity Assessment: Overall WFL for tasks assessed    Cervical / Trunk Assessment Cervical / Trunk Assessment: Normal  Communication   Communication Communication: No apparent difficulties    Cognition Arousal: Alert Behavior During Therapy: WFL for tasks assessed/performed   PT - Cognitive impairments: No apparent impairments                                 Cueing       General Comments      Exercises     Assessment/Plan    PT Assessment Patient does not need any further PT services  PT Problem List         PT Treatment Interventions      PT Goals (Current goals can be found in the  Care Plan section)  Acute Rehab PT Goals PT Goal Formulation: All assessment and education complete, DC therapy    Frequency       Co-evaluation               AM-PAC PT "6 Clicks" Mobility  Outcome Measure Help needed turning from your back to your side while in a flat bed without using bedrails?: None Help needed moving from lying on your back to sitting on the side of a flat bed without using bedrails?: None Help needed moving to and from a bed to a chair (including a wheelchair)?: None Help needed standing up from a chair using your arms (e.g., wheelchair or bedside chair)?: None Help needed to walk in hospital room?: None Help needed climbing 3-5 steps with a railing? : None 6 Click Score: 24    End of Session   Activity Tolerance: Patient tolerated treatment well Patient left: in chair;with call bell/phone within reach;with family/visitor present   PT Visit Diagnosis: Difficulty in walking, not elsewhere classified (R26.2)    Time: 1041-1100 PT Time Calculation (min) (ACUTE ONLY): 19 min   Charges:   PT Evaluation $PT Eval Low Complexity: 1 Low   PT General Charges $$ ACUTE PT VISIT: 1 Visit        Thomasene Mohair PT, DPT Physical Therapist Acute Rehabilitation Services Office: [phone]   Janan Halter [place] 11/15/2023, 1:36 PM

## 2023-11-15 NOTE — Plan of Care (Signed)

## 2023-11-15 NOTE — Progress Notes (Signed)
 Nutrition Note  RD consulted via COPD protocol.  Patient working with therapies at time of visit.  Pt currently consuming 100% of meals. On Mounjaro and has been trying to lose weight.  Weights have been relatively stable.   Wt Readings from Last 15 Encounters:  11/11/23 110.7 kg  08/29/23 113.3 kg  07/18/23 108.4 kg  06/28/23 103.4 kg  06/13/23 107 kg  05/23/23 108 kg  04/11/23 109.6 kg  04/10/23 110 kg  03/21/23 110.9 kg  02/28/23 111.1 kg  02/20/23 109 kg  02/08/23 109 kg  02/07/23 110 kg  01/23/23 110.1 kg  01/17/23 111.8 kg    BMI: 34.9 kg/m^2 Patient meets criteria for obesity based on current BMI.   Current diet order is Heart healthy/CHO modified, patient is consuming approximately 100% of meals at this time. Labs and medications reviewed.   No nutrition interventions warranted at this time. If nutrition issues arise, please consult RD.   Tilda Franco, MS, RD, LDN Inpatient Clinical Dietitian Contact via Secure chat

## 2023-11-15 NOTE — Progress Notes (Signed)
 PROGRESS NOTE    Russell Burns  QMV:[id number] DOB: 25-Aug-1966 DOA: 11/14/2023 PCP: [name], MD   Brief Narrative:  Russell Burns is a 59 y.o. 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   Assessment & Plan:   Principal Problem:   Acute hypoxemic respiratory failure (HCC) Active Problems:   Type 2 diabetes mellitus (HCC)   Hypertension associated with diabetes (HCC)   Hyperlipidemia associated with type 2 diabetes mellitus (HCC)   Chronic diastolic CHF (congestive heart failure) (HCC)   Malignant neoplasm of left kidney (HCC)   GERD (gastroesophageal reflux disease)   Asthmatic bronchitis , chronic (HCC)   Hypothyroidism   AKI (acute kidney injury) (HCC)  Acute hypoxic respiratory failure Likely acute asthmatic bronchitis versus COPD exacerbation:  -Chest x-ray shows questionable bilateral edema -Given timing concerning for previous respiratory infection with progressive respiratory distress over the past 2 weeks -Viral panel negative -Treating for community-acquired pneumonia x 5 days given history -Patient has known previous workspace inhalant reactivity, denies any recent exposures at work -Continue high-dose steroids, wean pending clinical improvement -Compliant with asthma inhalers, most recent flare 5 months ago, usually well-controlled  Non-insulin-dependent type 2 diabetes  Continue sliding scale, hypoglycemic protocol; monitor closely the setting of steroids   Essential hypertension: Continue home amlodipine, losartan Hyperlipidemia: Continue atorvastatin, ezetimibe GERD: Continue PPI Chronic kidney  disease stage IIIa: Status post radical nephrectomy. Chronic diastolic CHF: Appears compensated.  Diastolic in nature. Unspecified gammopathy -followed outpatient  DVT prophylaxis: enoxaparin (LOVENOX) injection 40 mg Start: 11/15/23 1000 Code Status:   Code Status: Full Code Family Communication: Wife at bedside  Status is: Inpt  Dispo: The patient is from: Home              Anticipated d/c is to: Home              Anticipated d/c date is: 48-72h              Patient currently NOT medically stable for discharge  Consultants:  None  Procedures:  None  Antimicrobials:  Azithromycin/Ceftriaxone   Subjective: No acute issues or events overnight  Objective: Vitals:   11/14/23 2300 11/14/23 2349 11/15/23 0353 11/15/23 0801  BP: 139/85 (!) 160/85 112/64 124/78  Pulse: 88 88 81 74  Resp: 16 20 20    Temp:  98 F (36.7 C) 98.2 F (36.8 C) 98.1 F (36.7 C)  TempSrc:      SpO2: 98% 97% 97% 96%    Intake/Output Summary (Last 24 hours) at 11/15/2023 0805 Last data filed at 11/15/2023 0400 Gross per 24 hour  Intake 100 ml  Output --  Net 100 ml   There were no vitals filed for this visit.  Examination:  General:  Pleasantly resting in bed, No acute distress. HEENT:  Normocephalic atraumatic.  Sclerae nonicteric, noninjected.  Extraocular movements intact bilaterally. Neck:  Without mass or deformity.  Trachea is midline. Lungs:  Clear to auscultate bilaterally without rhonchi, wheeze, or rales. Heart:  Regular rate and rhythm.  Without murmurs, rubs, or gallops. Abdomen:  Soft, nontender, nondistended.  Without guarding or rebound. Extremities: Without cyanosis, clubbing, edema, or obvious deformity. Skin:  Warm and  dry, no erythema.  Data Reviewed: I have personally reviewed following labs and imaging studies  CBC: Recent Labs  Lab 11/11/23 1304 11/14/23 1635 11/15/23 0033  WBC 12.8* 13.8* 10.1  NEUTROABS 5.8 6.2  --   HGB 14.7 14.8 15.3  HCT 44.3 45.1 47.5   MCV 93.5 96.6 97.9  PLT 244 256 265   Basic Metabolic Panel: Recent Labs  Lab 11/11/23 1304 11/14/23 1635 11/15/23 0033  NA 137 137 134*  K 4.4 4.5 5.0  CL 101 103 98  CO2 28 24 21*  GLUCOSE 151* 156* 236*  BUN 14 15 17   CREATININE 1.25* 1.36* 1.36*  CALCIUM 9.9 9.4 9.1   GFR: Estimated Creatinine Clearance: 75.6 mL/min (A) (by C-G formula based on SCr of 1.36 mg/dL (H)).  Liver Function Tests: Recent Labs  Lab 11/11/23 1304 11/14/23 1635 11/15/23 0033  AST 31 40 43*  ALT 18 25 25   ALKPHOS 57 52 55  BILITOT 0.3 0.6 0.8  PROT 7.2 7.5 8.1  ALBUMIN 4.8 4.4 4.6   HbA1C: Recent Labs    11/15/23 0033  HGBA1C 6.5*   CBG: Recent Labs  Lab 11/14/23 2359 11/15/23 0741  GLUCAP 269* 191*    Recent Results (from the past 240 hours)  Resp panel by RT-PCR (RSV, Flu A&B, Covid) Anterior Nasal Swab     Status: None   Collection Time: 11/14/23  4:35 PM   Specimen: Anterior Nasal Swab  Result Value Ref Range Status   SARS Coronavirus 2 by RT PCR NEGATIVE NEGATIVE Final    Comment: (NOTE) SARS-CoV-2 target nucleic acids are NOT DETECTED.  The SARS-CoV-2 RNA is generally detectable in upper respiratory specimens during the acute phase of infection. The lowest concentration of SARS-CoV-2 viral copies this assay can detect is 138 copies/mL. A negative result does not preclude SARS-Cov-2 infection and should not be used as the sole basis for treatment or other patient management decisions. A negative result may occur with  improper specimen collection/handling, submission of specimen other than nasopharyngeal swab, presence of viral mutation(s) within the areas targeted by this assay, and inadequate number of viral copies(<138 copies/mL). A negative result must be combined with clinical observations, patient history, and epidemiological information. The expected result is Negative.  Fact Sheet for Patients:  BloggerCourse.com  Fact Sheet for  Healthcare Providers:  SeriousBroker.it  This test is no t yet approved or cleared by the [place] FDA and  has been authorized for detection and/or diagnosis of SARS-CoV-2 by FDA under an Emergency Use Authorization (EUA). This EUA will remain  in effect (meaning this test can be used) for the duration of the COVID-19 declaration under Section 564(b)(1) of the Act, 21 U.S.C.section 360bbb-3(b)(1), unless the authorization is terminated  or revoked sooner.       Influenza A by PCR NEGATIVE NEGATIVE Final   Influenza B by PCR NEGATIVE NEGATIVE Final    Comment: (NOTE) The Xpert Xpress SARS-CoV-2/FLU/RSV plus assay is intended as an aid in the diagnosis of influenza from Nasopharyngeal swab specimens and should not be used as a sole basis for treatment. Nasal washings and aspirates are unacceptable for Xpert Xpress SARS-CoV-2/FLU/RSV testing.  Fact Sheet for Patients: BloggerCourse.com  Fact Sheet for Healthcare Providers: SeriousBroker.it  This test is not yet approved or cleared by the [place] FDA and has been authorized for detection and/or diagnosis of SARS-CoV-2 by FDA under an Emergency Use Authorization (EUA). This EUA will remain in effect (meaning this test can be used)  for the duration of the COVID-19 declaration under Section 564(b)(1) of the Act, 21 U.S.C. section 360bbb-3(b)(1), unless the authorization is terminated or revoked.     Resp Syncytial Virus by PCR NEGATIVE NEGATIVE Final    Comment: (NOTE) Fact Sheet for Patients: BloggerCourse.com  Fact Sheet for Healthcare Providers: SeriousBroker.it  This test is not yet approved or cleared by the [place] FDA and has been authorized for detection and/or diagnosis of SARS-CoV-2 by FDA under an Emergency Use Authorization (EUA). This EUA will remain in effect (meaning this  test can be used) for the duration of the COVID-19 declaration under Section 564(b)(1) of the Act, 21 U.S.C. section 360bbb-3(b)(1), unless the authorization is terminated or revoked.  Performed at [facility], [address], [place], [place] [address]     Radiology Studies: [facility] Chest Port 1 View Result Date: 11/14/2023 CLINICAL DATA:  Two-week history of cough and shortness of breath EXAM: PORTABLE CHEST 1 VIEW COMPARISON:  Chest radiograph dated 06/28/2023 FINDINGS: Normal lung volumes. No focal consolidations. No pleural effusion or pneumothorax. The heart size and mediastinal contours are within normal limits. No acute osseous abnormality. IMPRESSION: No acute disease. Electronically Signed   By: Agustin Cree M.D.   On: 11/14/2023 16:42   Scheduled Meds:  amLODipine  10 mg Oral Daily   atorvastatin  80 mg Oral Daily   empagliflozin  25 mg Oral Daily   enoxaparin (LOVENOX) injection  40 mg Subcutaneous Q24H   ezetimibe  10 mg Oral Daily   famotidine  20 mg Oral QPM   insulin aspart  0-20 Units Subcutaneous TID WC   insulin aspart  0-5 Units Subcutaneous QHS   ipratropium-albuterol  3 mL Nebulization Q6H   losartan  100 mg Oral Daily   methylPREDNISolone (SOLU-MEDROL) injection  40 mg Intravenous Q12H   Followed by   Melene Muller ON 11/16/2023] predniSONE  40 mg Oral Q breakfast   tamsulosin  0.4 mg Oral Daily   Continuous Infusions:  cefTRIAXone (ROCEPHIN)  IV 1 g (11/15/23 0013)    LOS: 1 day   Time spent:  Azucena Fallen, DO [facility]  If 7PM-7AM, please contact night-coverage [email or web address]  11/15/2023, 8:05 AM

## 2023-11-15 NOTE — Progress Notes (Signed)
   11/15/23 1422  TOC Brief Assessment  Insurance and Status Reviewed  Patient has primary care physician Yes  Home environment has been reviewed Home w/ spouse  Prior level of function: Independent  Prior/Current Home Services No current home services  Social Drivers of Health Review SDOH reviewed no interventions necessary  Readmission risk has been reviewed Yes  Transition of care needs no transition of care needs at this time

## 2023-11-15 NOTE — Evaluation (Signed)
 Occupational Therapy Evaluation Patient Details Name: Russell Burns MRN: [id number] DOB: 03/13/1966 Today's Date: 11/15/2023   History of Present Illness   58 y.o. male presented to the ER with shortness of breath and persistent cough amd admitted 11/14/23 for acute hypoxic respiratory failure: Most likely secondary to asthmatic bronchitis with flareup.  Past medical history significant of type 2 diabetes, asthmatic bronchitis, aortic stenosis, history of occupational lung disease which appears to have resolved, history of renal cell carcinoma status post nephrectomy who was on Keytruda until September of last year when he completed it currently in remission followed by oncology,     Clinical Impressions Pt is at Ind level with ADLs and ombility using no DME or ADs including shower transfers. PTA pt lives at home with his wife and was Ind with all ADLs, IADLs, works full time and drives. Pt and his wife educated on energy conservation strategies with handout provided. All education completed and no further acute OT services indicated at this time. OT will sign off     If plan is discharge home, recommend the following:         Functional Status Assessment   Patient has not had a recent decline in their functional status     Equipment Recommendations   None recommended by OT     Recommendations for Other Services         Precautions/Restrictions   Precautions Precautions: None Recall of Precautions/Restrictions: Intact Restrictions Weight Bearing Restrictions Per Provider Order: No     Mobility Bed Mobility Overal bed mobility: Independent             General bed mobility comments: pt in recliner on arrival    Transfers Overall transfer level: Independent                        Balance Overall balance assessment: No apparent balance deficits (not formally assessed)                                         ADL either  performed or assessed with clinical judgement   ADL Overall ADL's : Independent;At baseline                                             Vision Baseline Vision/History: 1 Wears glasses Ability to See in Adequate Light: 0 Adequate Patient Visual Report: No change from baseline       Perception         Praxis         Pertinent Vitals/Pain Pain Assessment Pain Assessment: No/denies pain     Extremity/Trunk Assessment Upper Extremity Assessment Upper Extremity Assessment: Overall WFL for tasks assessed   Lower Extremity Assessment Lower Extremity Assessment: Defer to PT evaluation   Cervical / Trunk Assessment Cervical / Trunk Assessment: Normal   Communication Communication Communication: No apparent difficulties   Cognition                                             Cueing  General Comments          Exercises     Shoulder  Instructions      Home Living Family/patient expects to be discharged to:: Private residence Living Arrangements: Spouse/significant other Available Help at Discharge: Family Type of Home: House       Home Layout: Multi-level Alternate Level Stairs-Number of Steps: split level 5-6 to each floor Alternate Level Stairs-Rails: Right Bathroom Shower/Tub: Chief Strategy Officer: Standard     Home Equipment: None          Prior Functioning/Environment Prior Level of Function : Independent/Modified Independent;Working/employed                    OT Problem List: Decreased activity tolerance   OT Treatment/Interventions:        OT Goals(Current goals can be found in the care plan section)   Acute Rehab OT Goals Patient Stated Goal: go home   OT Frequency:       Co-evaluation              AM-PAC OT "6 Clicks" Daily Activity     Outcome Measure Help from another person eating meals?: None Help from another person taking care of personal grooming?:  None Help from another person toileting, which includes using toliet, bedpan, or urinal?: None Help from another person bathing (including washing, rinsing, drying)?: None Help from another person to put on and taking off regular upper body clothing?: None Help from another person to put on and taking off regular lower body clothing?: None 6 Click Score: 24   End of Session    Activity Tolerance: Patient tolerated treatment well Patient left: in bed;Other (comment) (sitting EOB)  OT Visit Diagnosis: Other abnormalities of gait and mobility (R26.89)                Time: 2725-3664 OT Time Calculation (min): 21 min Charges:  OT General Charges $OT Visit: 1 Visit OT Evaluation $OT Eval Low Complexity: 1 Low    Galen Manila 11/15/2023, 2:43 PM

## 2023-11-15 NOTE — Progress Notes (Signed)
 Pt is coughing a lot and complaints of chest tightness makes him hard to breath. Pt currently is on 3L per nasal cannula sats=96-97%. Lung auscultation with bilateral expiratory wheezes. Pt recently received duoneb and a dose of Robitussin was given around 1900. Virgel Manifold, NP made aware thru secure chat.

## 2023-11-15 NOTE — Plan of Care (Signed)
  Problem: Education: Goal: Ability to describe self-care measures that may prevent or decrease complications (Diabetes Survival Skills Education) will improve Outcome: Not Progressing Goal: Individualized Educational Video(s) Outcome: Not Progressing   Problem: Coping: Goal: Ability to adjust to condition or change in health will improve Outcome: Not Progressing   Problem: Fluid Volume: Goal: Ability to maintain a balanced intake and output will improve Outcome: Not Progressing   Problem: Health Behavior/Discharge Planning: Goal: Ability to identify and utilize available resources and services will improve Outcome: Not Progressing Goal: Ability to manage health-related needs will improve Outcome: Not Progressing   Problem: Metabolic: Goal: Ability to maintain appropriate glucose levels will improve Outcome: Not Progressing   Problem: Nutritional: Goal: Maintenance of adequate nutrition will improve Outcome: Not Progressing Goal: Progress toward achieving an optimal weight will improve Outcome: Not Progressing   Problem: Skin Integrity: Goal: Risk for impaired skin integrity will decrease Outcome: Not Progressing   Problem: Tissue Perfusion: Goal: Adequacy of tissue perfusion will improve Outcome: Not Progressing   Problem: Education: Goal: Knowledge of General Education information will improve Description: Including pain rating scale, medication(s)/side effects and non-pharmacologic comfort measures Outcome: Not Progressing   Problem: Health Behavior/Discharge Planning: Goal: Ability to manage health-related needs will improve Outcome: Not Progressing   Problem: Clinical Measurements: Goal: Ability to maintain clinical measurements within normal limits will improve Outcome: Not Progressing Goal: Will remain free from infection Outcome: Not Progressing Goal: Diagnostic test results will improve Outcome: Not Progressing Goal: Respiratory complications will  improve Outcome: Not Progressing Goal: Cardiovascular complication will be avoided Outcome: Not Progressing   Problem: Activity: Goal: Risk for activity intolerance will decrease Outcome: Not Progressing   Problem: Nutrition: Goal: Adequate nutrition will be maintained Outcome: Not Progressing   Problem: Coping: Goal: Level of anxiety will decrease Outcome: Not Progressing   Problem: Elimination: Goal: Will not experience complications related to bowel motility Outcome: Not Progressing Goal: Will not experience complications related to urinary retention Outcome: Not Progressing   Problem: Pain Managment: Goal: General experience of comfort will improve and/or be controlled Outcome: Not Progressing   Problem: Safety: Goal: Ability to remain free from injury will improve Outcome: Not Progressing   Problem: Skin Integrity: Goal: Risk for impaired skin integrity will decrease Outcome: Not Progressing   Problem: Education: Goal: Knowledge of disease or condition will improve Outcome: Not Progressing Goal: Knowledge of the prescribed therapeutic regimen will improve Outcome: Not Progressing Goal: Individualized Educational Video(s) Outcome: Not Progressing   Problem: Activity: Goal: Ability to tolerate increased activity will improve Outcome: Not Progressing Goal: Will verbalize the importance of balancing activity with adequate rest periods Outcome: Not Progressing   Problem: Respiratory: Goal: Ability to maintain a clear airway will improve Outcome: Not Progressing Goal: Levels of oxygenation will improve Outcome: Not Progressing Goal: Ability to maintain adequate ventilation will improve Outcome: Not Progressing

## 2023-11-15 NOTE — Plan of Care (Signed)

## 2023-11-16 DIAGNOSIS — J9601 Acute respiratory failure with hypoxia: Secondary | ICD-10-CM | POA: Diagnosis not present

## 2023-11-16 LAB — GLUCOSE, CAPILLARY
Glucose-Capillary: 166 mg/dL — ABNORMAL HIGH (ref 70–99)
Glucose-Capillary: 147 mg/dL — ABNORMAL HIGH (ref 70–99)
Glucose-Capillary: 247 mg/dL — ABNORMAL HIGH (ref 70–99)
Glucose-Capillary: 327 mg/dL — ABNORMAL HIGH (ref 70–99)

## 2023-11-16 MED ORDER — IPRATROPIUM-ALBUTEROL 0.5-2.5 (3) MG/3ML IN SOLN
3.0000 mL | RESPIRATORY_TRACT | Status: DC
  Administered 2023-11-16 – 2023-11-18 (×13): 3 mL via RESPIRATORY_TRACT
  Filled 2023-11-16 (×17): qty 3

## 2023-11-16 MED ORDER — DIPHENHYDRAMINE HCL 25 MG PO CAPS
25.0000 mg | ORAL_CAPSULE | Freq: Once | ORAL | Status: AC
  Administered 2023-11-16: 25 mg via ORAL
  Filled 2023-11-16: qty 1

## 2023-11-16 MED ORDER — FLUTICASONE PROPIONATE 50 MCG/ACT NA SUSP
2.0000 | Freq: Every day | NASAL | Status: DC
  Administered 2023-11-16 – 2023-11-20 (×5): 2 via NASAL
  Filled 2023-11-16: qty 16

## 2023-11-16 MED ORDER — TRAZODONE HCL 50 MG PO TABS
25.0000 mg | ORAL_TABLET | Freq: Every evening | ORAL | Status: DC | PRN
  Administered 2023-11-16: 25 mg via ORAL
  Filled 2023-11-16: qty 1

## 2023-11-16 MED ORDER — GUAIFENESIN ER 600 MG PO TB12
1200.0000 mg | ORAL_TABLET | Freq: Two times a day (BID) | ORAL | Status: DC | PRN
  Administered 2023-11-16 – 2023-11-19 (×4): 1200 mg via ORAL
  Filled 2023-11-16 (×4): qty 2

## 2023-11-16 MED ORDER — BENZONATATE 100 MG PO CAPS
100.0000 mg | ORAL_CAPSULE | Freq: Three times a day (TID) | ORAL | Status: DC | PRN
  Administered 2023-11-18 – 2023-11-19 (×2): 100 mg via ORAL
  Filled 2023-11-16 (×3): qty 1

## 2023-11-16 NOTE — Plan of Care (Signed)

## 2023-11-16 NOTE — Progress Notes (Signed)
 PROGRESS NOTE    Russell Burns  ZOX:[id number] DOB: [birth year] DOA: 11/14/2023 PCP: [name], MD   Brief Narrative:  Russell Burns is a 58 y.o. 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   Assessment & Plan:   Principal Problem:   Acute hypoxemic respiratory failure (HCC) Active Problems:   Type 2 diabetes mellitus (HCC)   Hypertension associated with diabetes (HCC)   Hyperlipidemia associated with type 2 diabetes mellitus (HCC)   Chronic diastolic CHF (congestive heart failure) (HCC)   Malignant neoplasm of left kidney (HCC)   GERD (gastroesophageal reflux disease)   Asthmatic bronchitis , chronic (HCC)   Hypothyroidism   AKI (acute kidney injury) (HCC)  Acute hypoxic respiratory failure Likely acute asthmatic bronchitis versus COPD exacerbation:  -Chest x-ray shows questionable bilateral edema -Given timing concerning for previous respiratory infection with progressive respiratory distress over the past 2 weeks -Viral panel negative -Treating for community-acquired pneumonia x 5 days given history -Patient has known previous workspace inhalant reactivity, denies any recent exposures at work but this remains most likely etiology for his acute onset respiratory distress -Continue high-dose steroids, wean pending clinical improvement -Compliant with asthma inhalers, most recent flare 5 months ago, usually well-controlled -Continue to wean oxygen as tolerated, hypoxic again overnight into the low 80s with coughing per report.  Non-insulin-dependent type 2 diabetes  Continue sliding scale, hypoglycemic protocol;  monitor closely the setting of steroids   Essential hypertension: Continue home amlodipine, losartan Hyperlipidemia: Continue atorvastatin, ezetimibe GERD: Continue PPI Chronic kidney disease stage IIIa: Status post radical nephrectomy. Chronic diastolic CHF: Appears compensated.  Diastolic in nature. Unspecified gammopathy -followed outpatient  DVT prophylaxis: enoxaparin (LOVENOX) injection 40 mg Start: 11/15/23 1000 Code Status:   Code Status: Full Code Family Communication: Wife at bedside  Status is: Inpt  Dispo: The patient is from: Home              Anticipated d/c is to: Home              Anticipated d/c date is: 24 to 48 hours              Patient currently NOT medically stable for discharge  Consultants:  None  Procedures:  None  Antimicrobials:  Azithromycin/Ceftriaxone   Subjective: Hypoxic event overnight, remains borderline hypoxic today even at rest  Objective: Vitals:   11/15/23 2012 11/15/23 2055 11/16/23 0014 11/16/23 0152  BP:  117/79    Pulse:  80 77 93  Resp:  20    Temp:  98.4 F (36.9 C)    TempSrc:      SpO2: 93% 98% 97% 95%    Intake/Output Summary (Last 24 hours) at 11/16/2023 0753 Last data filed at 11/15/2023 1300 Gross per 24 hour  Intake 720 ml  Output --  Net 720 ml   There were no vitals filed for this visit.  Examination:  General:  Pleasantly resting in bed, No acute distress. HEENT:  Normocephalic atraumatic.  Sclerae nonicteric, noninjected.  Extraocular movements intact bilaterally. Neck:  Without mass or deformity.  Trachea is midline. Lungs:  Clear to auscultate bilaterally without rhonchi, wheeze, or rales. Heart:  Regular rate and  rhythm.  Without murmurs, rubs, or gallops. Abdomen:  Soft, nontender, nondistended.  Without guarding or rebound. Extremities: Without cyanosis, clubbing, edema, or obvious deformity. Skin:  Warm and dry, no erythema.  Data Reviewed: I have personally reviewed following labs and imaging  studies  CBC: Recent Labs  Lab 11/11/23 1304 11/14/23 1635 11/15/23 0033  WBC 12.8* 13.8* 10.1  NEUTROABS 5.8 6.2  --   HGB 14.7 14.8 15.3  HCT 44.3 45.1 47.5  MCV 93.5 96.6 97.9  PLT 244 256 265   Basic Metabolic Panel: Recent Labs  Lab 11/11/23 1304 11/14/23 1635 11/15/23 0033  NA 137 137 134*  K 4.4 4.5 5.0  CL 101 103 98  CO2 28 24 21*  GLUCOSE 151* 156* 236*  BUN 14 15 17   CREATININE 1.25* 1.36* 1.36*  CALCIUM 9.9 9.4 9.1   GFR: Estimated Creatinine Clearance: 74.7 mL/min (A) (by C-G formula based on SCr of 1.36 mg/dL (H)).  Liver Function Tests: Recent Labs  Lab 11/11/23 1304 11/14/23 1635 11/15/23 0033  AST 31 40 43*  ALT 18 25 25   ALKPHOS 57 52 55  BILITOT 0.3 0.6 0.8  PROT 7.2 7.5 8.1  ALBUMIN 4.8 4.4 4.6   HbA1C: Recent Labs    11/15/23 0033  HGBA1C 6.5*   CBG: Recent Labs  Lab 11/15/23 0741 11/15/23 1206 11/15/23 1626 11/15/23 2052 11/16/23 0721  GLUCAP 191* 156* 220* 168* 166*    Recent Results (from the past 240 hours)  Resp panel by RT-PCR (RSV, Flu A&B, Covid) Anterior Nasal Swab     Status: None   Collection Time: 11/14/23  4:35 PM   Specimen: Anterior Nasal Swab  Result Value Ref Range Status   SARS Coronavirus 2 by RT PCR NEGATIVE NEGATIVE Final    Comment: (NOTE) SARS-CoV-2 target nucleic acids are NOT DETECTED.  The SARS-CoV-2 RNA is generally detectable in upper respiratory specimens during the acute phase of infection. The lowest concentration of SARS-CoV-2 viral copies this assay can detect is 138 copies/mL. A negative result does not preclude SARS-Cov-2 infection and should not be used as the sole basis for treatment or other patient management decisions. A negative result may occur with  improper specimen collection/handling, submission of specimen other than nasopharyngeal swab, presence of viral mutation(s) within the areas targeted by this assay, and inadequate number of viral copies(<138 copies/mL). A  negative result must be combined with clinical observations, patient history, and epidemiological information. The expected result is Negative.  Fact Sheet for Patients:  BloggerCourse.com  Fact Sheet for Healthcare Providers:  SeriousBroker.it  This test is no t yet approved or cleared by the [place] FDA and  has been authorized for detection and/or diagnosis of SARS-CoV-2 by FDA under an Emergency Use Authorization (EUA). This EUA will remain  in effect (meaning this test can be used) for the duration of the COVID-19 declaration under Section 564(b)(1) of the Act, 21 U.S.C.section 360bbb-3(b)(1), unless the authorization is terminated  or revoked sooner.       Influenza A by PCR NEGATIVE NEGATIVE Final   Influenza B by PCR NEGATIVE NEGATIVE Final    Comment: (NOTE) The Xpert Xpress SARS-CoV-2/FLU/RSV plus assay is intended as an aid in the diagnosis of influenza from Nasopharyngeal swab specimens and should not be used as a sole basis for treatment. Nasal washings and aspirates are unacceptable for Xpert Xpress SARS-CoV-2/FLU/RSV testing.  Fact Sheet for Patients: BloggerCourse.com  Fact Sheet for Healthcare Providers: SeriousBroker.it  This test is not yet  approved or cleared by the [place] and has been authorized for detection and/or diagnosis of SARS-CoV-2 by FDA under an Emergency Use Authorization (EUA). This EUA will remain in effect (meaning this test can be used) for the duration of the COVID-19 declaration under Section 564(b)(1) of the Act, 21 U.S.C. section 360bbb-3(b)(1), unless the authorization is terminated or revoked.     Resp Syncytial Virus by PCR NEGATIVE NEGATIVE Final    Comment: (NOTE) Fact Sheet for Patients: BloggerCourse.com  Fact Sheet for Healthcare  Providers: SeriousBroker.it  This test is not yet approved or cleared by the [place] FDA and has been authorized for detection and/or diagnosis of SARS-CoV-2 by FDA under an Emergency Use Authorization (EUA). This EUA will remain in effect (meaning this test can be used) for the duration of the COVID-19 declaration under Section 564(b)(1) of the Act, 21 U.S.C. section 360bbb-3(b)(1), unless the authorization is terminated or revoked.  Performed at [facility], [address], [place], [place] [address]     Radiology Studies: ECHOCARDIOGRAM COMPLETE Result Date: 11/15/2023    ECHOCARDIOGRAM REPORT   Patient Name:   [name] Date of Exam: 11/15/2023 Medical Rec #:  [id number]         Height:       70.0 in Accession #:    [id number]        Weight:       244.1 lb Date of Birth:  06-07-1966         BSA:          2.272 m Patient Age:    57 years          BP:           110/74 mmHg Patient Gender: M                 HR:           80 bpm. Exam Location:  Inpatient Procedure: 2D Echo, Cardiac Doppler, Color Doppler and Intracardiac            Opacification Agent (Both Spectral and Color Flow Doppler were            utilized during procedure). Indications:    CHF-Acute Diastolic I50.31  History:        Patient has prior history of Echocardiogram examinations, most                 recent 02/15/2023. CHF; Risk Factors:Hypertension and Diabetes.  Sonographer:    Webb Laws Referring Phys: Patrice.Shin MOHAMMAD L GARBA IMPRESSIONS  1. Aortic valve not well visualized but possibly bicuspid; severe AS (mean gradient 41 mmHg; AVA 0.56 cm2; DI 0.18); AS worse compared to 11/15/23.  2. Left ventricular ejection fraction, by estimation, is 60 to 65%. The left ventricle has normal function. The left ventricle has no regional wall motion abnormalities. The left ventricular internal cavity size was mildly dilated. Left ventricular diastolic parameters are consistent with  Grade I diastolic dysfunction (impaired relaxation).  3. Right ventricular systolic function is normal. The right ventricular size is normal.  4. The mitral valve is normal in structure. No evidence of mitral valve regurgitation. No evidence of mitral stenosis.  5. The aortic valve has an indeterminant number of cusps. Aortic valve regurgitation is not visualized. Severe aortic valve stenosis.  6. Aortic dilatation noted. There is mild dilatation of the aortic root, measuring 40 mm.  7. The inferior vena cava is normal in size with greater  than 50% respiratory variability, suggesting right atrial pressure of 3 mmHg. FINDINGS  Left Ventricle: Left ventricular ejection fraction, by estimation, is 60 to 65%. The left ventricle has normal function. The left ventricle has no regional wall motion abnormalities. Definity contrast agent was given IV to delineate the left ventricular  endocardial borders. Strain imaging was not performed. The left ventricular internal cavity size was mildly dilated. There is no left ventricular hypertrophy. Left ventricular diastolic parameters are consistent with Grade I diastolic dysfunction (impaired relaxation). Right Ventricle: The right ventricular size is normal. Right ventricular systolic function is normal. Left Atrium: Left atrial size was normal in size. Right Atrium: Right atrial size was normal in size. Pericardium: There is no evidence of pericardial effusion. Mitral Valve: The mitral valve is normal in structure. Mild mitral annular calcification. No evidence of mitral valve regurgitation. No evidence of mitral valve stenosis. Tricuspid Valve: The tricuspid valve is normal in structure. Tricuspid valve regurgitation is not demonstrated. No evidence of tricuspid stenosis. Aortic Valve: The aortic valve has an indeterminant number of cusps. Aortic valve regurgitation is not visualized. Severe aortic stenosis is present. Aortic valve mean gradient measures 41.4 mmHg. Aortic valve  peak gradient measures 67.7 mmHg. Aortic valve area, by VTI measures 0.56 cm. Pulmonic Valve: The pulmonic valve was not well visualized. Pulmonic valve regurgitation is not visualized. No evidence of pulmonic stenosis. Aorta: Aortic dilatation noted. There is mild dilatation of the aortic root, measuring 40 mm. Venous: The inferior vena cava is normal in size with greater than 50% respiratory variability, suggesting right atrial pressure of 3 mmHg. IAS/Shunts: No atrial level shunt detected by color flow Doppler. Additional Comments: Aortic valve not well visualized but possibly bicuspid; severe AS (mean gradient 41 mmHg; AVA 0.56 cm2; DI 0.18); AS worse compared to 11/15/23. 3D imaging was not performed.  LEFT VENTRICLE PLAX 2D LVIDd:         5.50 cm      Diastology LVIDs:         3.40 cm      LV e' medial:    5.98 cm/s LV PW:         1.10 cm      LV E/e' medial:  13.0 LV IVS:        0.80 cm      LV e' lateral:   8.49 cm/s LVOT diam:     2.00 cm      LV E/e' lateral: 9.2 LV SV:         53 LV SV Index:   23 LVOT Area:     3.14 cm  LV Volumes (MOD) LV vol d, MOD A2C: 131.0 ml LV vol d, MOD A4C: 107.0 ml LV vol s, MOD A2C: 64.0 ml LV vol s, MOD A4C: 56.9 ml LV SV MOD A2C:     67.0 ml LV SV MOD A4C:     107.0 ml LV SV MOD BP:      56.6 ml RIGHT VENTRICLE            IVC RV S prime:     9.79 cm/s  IVC diam: 1.70 cm TAPSE (M-mode): 3.5 cm LEFT ATRIUM             Index        RIGHT ATRIUM           Index LA diam:        3.90 cm 1.72 cm/m   RA Area:     10.50  cm LA Vol (A2C):   42.6 ml 18.75 ml/m  RA Volume:   17.70 ml  7.79 ml/m LA Vol (A4C):   40.8 ml 17.96 ml/m LA Biplane Vol: 44.7 ml 19.68 ml/m  AORTIC VALVE AV Area (Vmax):    0.61 cm AV Area (Vmean):   0.57 cm AV Area (VTI):     0.56 cm AV Vmax:           411.40 cm/s AV Vmean:          301.400 cm/s AV VTI:            0.940 m AV Peak Grad:      67.7 mmHg AV Mean Grad:      41.4 mmHg LVOT Vmax:         80.00 cm/s LVOT Vmean:        54.700 cm/s LVOT VTI:           0.169 m LVOT/AV VTI ratio: 0.18  AORTA Ao Root diam: 3.20 cm Ao Asc diam:  4.00 cm MITRAL VALVE MV Area (PHT): 3.48 cm    SHUNTS MV Decel Time: 218 msec    Systemic VTI:  0.17 m MV E velocity: 78.00 cm/s  Systemic Diam: 2.00 cm MV A velocity: 83.60 cm/s MV E/A ratio:  0.93 Olga Millers MD Electronically signed by Olga Millers MD Signature Date/Time: 11/15/2023/2:08:30 PM    Final    DG Chest Port 1 View Result Date: 11/14/2023 CLINICAL DATA:  Two-week history of cough and shortness of breath EXAM: PORTABLE CHEST 1 VIEW COMPARISON:  Chest radiograph dated 06/28/2023 FINDINGS: Normal lung volumes. No focal consolidations. No pleural effusion or pneumothorax. The heart size and mediastinal contours are within normal limits. No acute osseous abnormality. IMPRESSION: No acute disease. Electronically Signed   By: Agustin Cree M.D.   On: 11/14/2023 16:42   Scheduled Meds:  amLODipine  10 mg Oral Daily   atorvastatin  80 mg Oral Daily   azithromycin  500 mg Oral Daily   empagliflozin  25 mg Oral Daily   enoxaparin (LOVENOX) injection  40 mg Subcutaneous Q24H   ezetimibe  10 mg Oral Daily   famotidine  20 mg Oral QPM   fluticasone  2 spray Each Nare Daily   insulin aspart  0-20 Units Subcutaneous TID WC   insulin aspart  0-5 Units Subcutaneous QHS   ipratropium-albuterol  3 mL Nebulization Q6H   losartan  100 mg Oral Daily   predniSONE  40 mg Oral Q breakfast   tamsulosin  0.4 mg Oral Daily   Continuous Infusions:  cefTRIAXone (ROCEPHIN)  IV 1 g (11/15/23 2302)    LOS: 2 days   Time spent:  Azucena Fallen, DO [facility]  If 7PM-7AM, please contact night-coverage [email or web address]  11/16/2023, 7:53 AM

## 2023-11-17 ENCOUNTER — Telehealth: Payer: Self-pay | Admitting: Cardiology

## 2023-11-17 DIAGNOSIS — J9601 Acute respiratory failure with hypoxia: Secondary | ICD-10-CM | POA: Diagnosis not present

## 2023-11-17 DIAGNOSIS — I350 Nonrheumatic aortic (valve) stenosis: Secondary | ICD-10-CM

## 2023-11-17 LAB — GLUCOSE, CAPILLARY
Glucose-Capillary: 140 mg/dL — ABNORMAL HIGH (ref 70–99)
Glucose-Capillary: 149 mg/dL — ABNORMAL HIGH (ref 70–99)
Glucose-Capillary: 227 mg/dL — ABNORMAL HIGH (ref 70–99)
Glucose-Capillary: 328 mg/dL — ABNORMAL HIGH (ref 70–99)

## 2023-11-17 MED ORDER — ALBUTEROL SULFATE (2.5 MG/3ML) 0.083% IN NEBU
2.5000 mg | INHALATION_SOLUTION | RESPIRATORY_TRACT | 5 refills | Status: DC | PRN
Start: 2023-11-17 — End: 2023-12-07

## 2023-11-17 MED ORDER — PREDNISONE 10 MG PO TABS
ORAL_TABLET | ORAL | 0 refills | Status: DC
Start: 2023-11-17 — End: 2023-12-05

## 2023-11-17 MED ORDER — AZITHROMYCIN 500 MG PO TABS
500.0000 mg | ORAL_TABLET | Freq: Every day | ORAL | 0 refills | Status: DC
Start: 2023-11-17 — End: 2023-11-20

## 2023-11-17 MED ORDER — HYDROCODONE BIT-HOMATROP MBR 5-1.5 MG/5ML PO SOLN: 5.0000 mL | Freq: Four times a day (QID) | ORAL | 0 refills | Status: DC | PRN

## 2023-11-17 NOTE — Plan of Care (Signed)

## 2023-11-17 NOTE — Telephone Encounter (Signed)
 Aram Beecham,  I sent you a staff message about this.  In the hospital for COPD. Had ECHO. Now severe bicuspid aortic valve stenosis. Aorta 46 mm. Has seen Dorris Fetch in the past 2023. Please have him come into office to discuss next steps ( R and L heart cath and referral back to [facility]) Thanks -Loraine Leriche

## 2023-11-17 NOTE — Progress Notes (Signed)
 PROGRESS NOTE    Russell Burns  ZHY:[id number] DOB: [birth year] DOA: 11/14/2023 PCP: [name], MD   Brief Narrative:  Russell Burns is a 58 y.o. 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   Assessment & Plan:   Principal Problem:   Acute hypoxemic respiratory failure (HCC) Active Problems:   Type 2 diabetes mellitus (HCC)   Hypertension associated with diabetes (HCC)   Hyperlipidemia associated with type 2 diabetes mellitus (HCC)   Chronic diastolic CHF (congestive heart failure) (HCC)   Malignant neoplasm of left kidney (HCC)   GERD (gastroesophageal reflux disease)   Asthmatic bronchitis , chronic (HCC)   Hypothyroidism   AKI (acute kidney injury) (HCC)  Acute hypoxic respiratory failure Likely acute asthmatic bronchitis versus COPD exacerbation:  -Chest x-ray shows questionable bilateral edema -Given timing concerning for previous respiratory infection with progressive respiratory distress over the past 2 weeks -Viral panel negative -Treating for community-acquired pneumonia x 5 days given history -Patient has known previous workspace inhalant reactivity, denies any recent exposures at work but this remains most likely etiology for his acute onset respiratory distress -Continue high-dose steroids, wean pending clinical improvement -Compliant with asthma inhalers, most recent flare 5 months ago, usually well-controlled -Patient ambulating this morning on room air without hypoxia, unfortunately this afternoon while attempting to ambulate with family he was noted to be dyspneic with notable dizziness found to be  hypoxic on room air into the high 80s.  Will discontinue his discharge and continue current management as above.   Non-insulin-dependent type 2 diabetes  Continue sliding scale, hypoglycemic protocol; monitor closely the setting of steroids   Essential hypertension: Continue home amlodipine, losartan Hyperlipidemia: Continue atorvastatin, ezetimibe GERD: Continue PPI Chronic kidney disease stage IIIa: Status post radical nephrectomy. Chronic diastolic CHF: Appears compensated.  Diastolic in nature. Unspecified gammopathy -followed outpatient  DVT prophylaxis: enoxaparin (LOVENOX) injection 40 mg Start: 11/15/23 1000 Code Status:   Code Status: Full Code Family Communication: Wife at bedside  Status is: Inpt  Dispo: The patient is from: Home              Anticipated d/c is to: Home              Anticipated d/c date is: 24 hours              Patient currently NOT medically stable for discharge  Consultants:  None  Procedures:  None  Antimicrobials:  Azithromycin/Ceftriaxone   Subjective: Hypoxic event while walking today  Objective: Vitals:   11/17/23 0559 11/17/23 0822 11/17/23 1229 11/17/23 1315  BP: 115/85  120/79   Pulse: 73  73   Resp: 18  20   Temp: (!) 97.5 F (36.4 C)  (!) 97.5 F (36.4 C)   TempSrc: Oral  Oral   SpO2: 99% 92% 94% 92%    Intake/Output Summary (Last 24 hours) at 11/17/2023 1404 Last data filed at 11/17/2023 0900 Gross per 24 hour  Intake 840 ml  Output --  Net 840 ml   There were no vitals filed for this visit.  Examination:  General:  Pleasantly resting in bed, No acute distress. HEENT:  Normocephalic atraumatic.  Sclerae nonicteric, noninjected.  Extraocular movements intact bilaterally. Neck:  Without mass or deformity.  Trachea is midline. Lungs:  Clear to auscultate bilaterally without rhonchi, wheeze, or rales. Heart:  Regular rate and rhythm.  Without murmurs, rubs, or gallops. Abdomen:  Soft, nontender, nondistended.   Without guarding or rebound. Extremities: Without cyanosis, clubbing, edema, or obvious deformity. Skin:  Warm and dry, no erythema.  Data Reviewed: I have personally reviewed following labs and imaging studies  CBC: Recent Labs  Lab 11/11/23 1304 11/14/23 1635 11/15/23 0033  WBC 12.8* 13.8* 10.1  NEUTROABS 5.8 6.2  --   HGB 14.7 14.8 15.3  HCT 44.3 45.1 47.5  MCV 93.5 96.6 97.9  PLT 244 256 265   Basic Metabolic Panel: Recent Labs  Lab 11/11/23 1304 11/14/23 1635 11/15/23 0033  NA 137 137 134*  K 4.4 4.5 5.0  CL 101 103 98  CO2 28 24 21*  GLUCOSE 151* 156* 236*  BUN 14 15 17   CREATININE 1.25* 1.36* 1.36*  CALCIUM 9.9 9.4 9.1   GFR: Estimated Creatinine Clearance: 74.7 mL/min (A) (by C-G formula based on SCr of 1.36 mg/dL (H)).  Liver Function Tests: Recent Labs  Lab 11/11/23 1304 11/14/23 1635 11/15/23 0033  AST 31 40 43*  ALT 18 25 25   ALKPHOS 57 52 55  BILITOT 0.3 0.6 0.8  PROT 7.2 7.5 8.1  ALBUMIN 4.8 4.4 4.6   HbA1C: Recent Labs    11/15/23 0033  HGBA1C 6.5*   CBG: Recent Labs  Lab 11/16/23 1128 11/16/23 1715 11/16/23 2240 11/17/23 0740 11/17/23 1146  GLUCAP 147* 247* 327* 140* 149*    Recent Results (from the past 240 hours)  Resp panel by RT-PCR (RSV, Flu A&B, Covid) Anterior Nasal Swab     Status: None   Collection Time: 11/14/23  4:35 PM   Specimen: Anterior Nasal Swab  Result Value Ref Range Status   SARS Coronavirus 2 by RT PCR NEGATIVE NEGATIVE Final    Comment: (NOTE) SARS-CoV-2 target nucleic acids are NOT DETECTED.  The SARS-CoV-2 RNA is generally detectable in upper respiratory specimens during the acute phase of infection. The lowest concentration of SARS-CoV-2 viral copies this assay can detect is 138 copies/mL. A negative result does not preclude SARS-Cov-2 infection and should not be used as the sole basis for treatment or other patient management decisions. A negative result may occur with  improper specimen  collection/handling, submission of specimen other than nasopharyngeal swab, presence of viral mutation(s) within the areas targeted by this assay, and inadequate number of viral copies(<138 copies/mL). A negative result must be combined with clinical observations, patient history, and epidemiological information. The expected result is Negative.  Fact Sheet for Patients:  BloggerCourse.com  Fact Sheet for Healthcare Providers:  SeriousBroker.it  This test is no t yet approved or cleared by the [place] FDA and  has been authorized for detection and/or diagnosis of SARS-CoV-2 by FDA under an Emergency Use Authorization (EUA). This EUA will remain  in effect (meaning this test can be used) for the duration of the COVID-19 declaration under Section 564(b)(1) of the Act, 21 U.S.C.section 360bbb-3(b)(1), unless the authorization is terminated  or revoked sooner.       Influenza A by PCR NEGATIVE NEGATIVE Final   Influenza B by PCR NEGATIVE NEGATIVE Final    Comment: (NOTE) The Xpert Xpress SARS-CoV-2/FLU/RSV plus assay is intended as an aid in the diagnosis of influenza from Nasopharyngeal swab specimens and should not be used as a sole basis for treatment.  Nasal washings and aspirates are unacceptable for Xpert Xpress SARS-CoV-2/FLU/RSV testing.  Fact Sheet for Patients: BloggerCourse.com  Fact Sheet for Healthcare Providers: SeriousBroker.it  This test is not yet approved or cleared by the [place] FDA and has been authorized for detection and/or diagnosis of SARS-CoV-2 by FDA under an Emergency Use Authorization (EUA). This EUA will remain in effect (meaning this test can be used) for the duration of the COVID-19 declaration under Section 564(b)(1) of the Act, 21 U.S.C. section 360bbb-3(b)(1), unless the authorization is terminated or revoked.     Resp Syncytial  Virus by PCR NEGATIVE NEGATIVE Final    Comment: (NOTE) Fact Sheet for Patients: BloggerCourse.com  Fact Sheet for Healthcare Providers: SeriousBroker.it  This test is not yet approved or cleared by the [place] FDA and has been authorized for detection and/or diagnosis of SARS-CoV-2 by FDA under an Emergency Use Authorization (EUA). This EUA will remain in effect (meaning this test can be used) for the duration of the COVID-19 declaration under Section 564(b)(1) of the Act, 21 U.S.C. section 360bbb-3(b)(1), unless the authorization is terminated or revoked.  Performed at [facility], [address], [place], [place] [address]     Radiology Studies: No results found.  Scheduled Meds:  amLODipine  10 mg Oral Daily   atorvastatin  80 mg Oral Daily   azithromycin  500 mg Oral Daily   empagliflozin  25 mg Oral Daily   enoxaparin (LOVENOX) injection  40 mg Subcutaneous Q24H   ezetimibe  10 mg Oral Daily   famotidine  20 mg Oral QPM   fluticasone  2 spray Each Nare Daily   insulin aspart  0-20 Units Subcutaneous TID WC   insulin aspart  0-5 Units Subcutaneous QHS   ipratropium-albuterol  3 mL Nebulization Q4H   losartan  100 mg Oral Daily   predniSONE  40 mg Oral Q breakfast   tamsulosin  0.4 mg Oral Daily   Continuous Infusions:  cefTRIAXone (ROCEPHIN)  IV 1 g (11/17/23 0003)    LOS: 3 days   Time spent:  Azucena Fallen, DO [facility]  If 7PM-7AM, please contact night-coverage [email or web address]  11/17/2023, 2:04 PM

## 2023-11-17 NOTE — Discharge Summary (Addendum)
 Physician Discharge Summary  Arpan Eskelson Burns GNF:[id number] DOB: [birth year] DOA: 11/14/2023  PCP: [name], MD  Admit date: 11/14/2023 Discharge date: 11/17/2023  Admitted From: Home Disposition:  Home  Recommendations for Outpatient Follow-up:  Follow up with PCP in 1-2 weeks Follow-up with pulmonology as discussed  Home Health: None Equipment/Devices: None  Discharge Condition: Stable CODE STATUS: Full Diet recommendation: Low-salt low-fat diet  Brief/Interim Summary: Russell Burns is a 58 y.o. 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  Patient admitted as above with acute hypoxic respiratory failure in the setting of asthmatic bronchitis versus COPD exacerbation.  Patient has had somewhat long complicated duration of symptoms.  Viral panel here negative, given recent exposures concern for possible community-acquired pneumonia, will complete antibiotic course over the next 24 hours, medications discharged as below.  Patient also has known workspace inhalant reactivity per previous pulmonology evaluation and there was concern that he was exposed to something at work over the past week causing his respiratory status to decline rapidly.  At this time on current regimen he continues to improve, no longer hypoxic with ambulation.  Otherwise stable and agreeable for discharge home.  Continue steroid taper, respiratory medications, antibiotics as below.  Otherwise no medication changes follow-up with pulmonology in the next 1 to 2 weeks if possible for further evaluation and treatment.  While getting ready to leave patient went to walk  again w/ family and staff - noted to become profoundly dizzy with mild hypoxia - will hold DC for an additional 24h to ensure safe disposition.  Discharge Diagnoses:  Principal Problem:   Acute hypoxemic respiratory failure (HCC) Active Problems:   Type 2 diabetes mellitus (HCC)   Hypertension associated with diabetes (HCC)   Hyperlipidemia associated with type 2 diabetes mellitus (HCC)   Chronic diastolic CHF (congestive heart failure) (HCC)   Malignant neoplasm of left kidney (HCC)   GERD (gastroesophageal reflux disease)   Asthmatic bronchitis , chronic (HCC)   Hypothyroidism   AKI (acute kidney injury) [facility])    Discharge Instructions  Discharge Instructions     Call MD for:  difficulty breathing, headache or visual disturbances   Complete by: As directed    Call MD for:  extreme fatigue   Complete by: As directed    Call MD for:  persistant dizziness or light-headedness   Complete by: As directed    Call MD for:  temperature >100.4   Complete by: As directed    Diet - low sodium heart healthy   Complete by: As directed    Increase activity slowly   Complete by: As directed       Allergies as of 11/17/2023       Reactions   Bee Venom Anaphylaxis   Cat Dander Itching, Swelling, Other (See Comments)   Runny nose, itchy/puffy eyes,         Medication List     STOP taking these medications    Dupixent 200 MG/1. Soaj Generic drug: Dupilumab   Mucinex DM Maximum Strength 60-1200 MG Tb12   promethazine-dextromethorphan 6.25-15 MG/5ML syrup Commonly known as: PROMETHAZINE-DM       TAKE these medications    acetaminophen 500 MG  tablet Commonly known as: TYLENOL Take 1,000 mg by mouth in the morning.   albuterol 108 (90 Base) MCG/ACT inhaler Commonly known as: VENTOLIN HFA Inhale 2 puffs into the lungs every 6 (six) hours as needed for wheezing or shortness of breath. What changed: Another medication with the same name was changed. Make sure you  understand how and when to take each.   albuterol (2.5 MG/3ML) 0.083% nebulizer solution Commonly known as: PROVENTIL Take 3 mLs (2.5 mg total) by nebulization every 4 (four) hours as needed for wheezing or shortness of breath. What changed: See the new instructions.   amLODipine 10 MG tablet Commonly known as: NORVASC Take 10 mg by mouth daily.   atorvastatin 80 MG tablet Commonly known as: LIPITOR Take 80 mg by mouth daily.   azithromycin 500 MG tablet Commonly known as: Zithromax Take 1 tablet (500 mg total) by mouth daily for 2 days.   Benadryl Allergy 25 MG tablet Generic drug: diphenhydrAMINE Take 50 mg by mouth 3 (three) times daily.   benzonatate 200 MG capsule Commonly known as: TESSALON Take 1 capsule (200 mg total) by mouth 3 (three) times daily as needed. What changed: reasons to take this   Breztri Aerosphere 160-9-4.8 MCG/ACT Aero Generic drug: Budeson-Glycopyrrol-Formoterol Inhale 2 puffs into the lungs 2 (two) times daily. Take 2 puffs first thing in am and then another 2 puffs about 12 hours later.   cetirizine 10 MG tablet Commonly known as: ZYRTEC Take 10 mg by mouth 2 (two) times daily.   Delsym 30 MG/5ML liquid Generic drug: dextromethorphan Take 15-30 mg by mouth in the morning and at bedtime.   EPINEPHrine 0.3 mg/0.3 mL Soaj injection Commonly known as: EPI-PEN Inject 0.3 mg into the muscle as needed for anaphylaxis.   ezetimibe 10 MG tablet Commonly known as: ZETIA Take 10 mg by mouth daily.   famotidine 20 MG tablet Commonly known as: PEPCID TAKE 1 TABLET BY MOUTH EVERY DAY AFTER SUPPER What changed:  how much to take how to take this when to take this additional instructions   fluticasone 50 MCG/ACT nasal spray Commonly known as: FLONASE Place 2 sprays into both nostrils in the morning and at bedtime.   HYDROcodone bit-homatropine 5-1.5 MG/5ML syrup Commonly known as: HYCODAN Take 5 mLs by mouth every 6 (six) hours as needed for  cough.   ipratropium-albuterol 0.5-2.5 (3) MG/3ML Soln Commonly known as: DUONEB Take 3 mLs by nebulization every 3 (three) hours as needed (shortness of breath or wheezing).   Jardiance 25 MG Tabs tablet Generic drug: empagliflozin Take 25 mg by mouth daily.   levothyroxine 25 MCG tablet Commonly known as: Synthroid Take 1 tablet (25 mcg total) by mouth daily before breakfast.   losartan 100 MG tablet Commonly known as: COZAAR Take 100 mg by mouth daily.   metFORMIN 1000 MG tablet Commonly known as: GLUCOPHAGE Take 1,000 mg by mouth in the morning and at bedtime.   Mounjaro 10 MG/0.5ML Pen Generic drug: tirzepatide Inject 10 mg into the skin once a week. What changed: when to take this   nystatin 100000 UNIT/ML suspension Commonly known as: MYCOSTATIN Take 5 mLs by mouth 4 (four) times daily as needed (as directed for thrush).   OneTouch Verio test strip Generic drug: glucose blood 1 each by Other route 5 (five) times daily.   pantoprazole 40 MG tablet Commonly known as: PROTONIX Take 1 tablet (40 mg total) by mouth 2 (two) times daily.   predniSONE 10 MG tablet  Commonly known as: DELTASONE Take 4 tablets (40 mg total) by mouth daily for 3 days, THEN 3 tablets (30 mg total) daily for 5 days, THEN 2 tablets (20 mg total) daily for 5 days, THEN 1 tablet (10 mg total) daily for 5 days. Start taking on: November 17, 2023   prochlorperazine 10 MG tablet Commonly known as: COMPAZINE Take 1 tablet (10 mg total) by mouth every 6 (six) hours as needed. What changed: reasons to take this   Repatha 140 MG/ML Sosy Generic drug: Evolocumab Inject 140 mg into the skin every 14 (fourteen) days.   tamsulosin 0.4 MG Caps capsule Commonly known as: FLOMAX Take 1 capsule (0.4 mg total) by mouth daily.        Allergies  Allergen Reactions   Bee Venom Anaphylaxis   Cat Dander Itching, Swelling and Other (See Comments)    Runny nose, itchy/puffy eyes,      Consultations: None   Procedures/Studies: ECHOCARDIOGRAM COMPLETE Result Date: 11/15/2023    ECHOCARDIOGRAM REPORT   Patient Name:   Russell Burns Date of Exam: 11/15/2023 Medical Rec #:  [id number]         Height:       70.0 in Accession #:    [id number]        Weight:       244.1 lb Date of Birth:  1965-11-12         BSA:          2.272 m Patient Age:    57 years          BP:           110/74 mmHg Patient Gender: M                 HR:           80 bpm. Exam Location:  Inpatient Procedure: 2D Echo, Cardiac Doppler, Color Doppler and Intracardiac            Opacification Agent (Both Spectral and Color Flow Doppler were            utilized during procedure). Indications:    CHF-Acute Diastolic I50.31  History:        Patient has prior history of Echocardiogram examinations, most                 recent 02/15/2023. CHF; Risk Factors:Hypertension and Diabetes.  Sonographer:    Webb Laws Referring Phys: Patrice.Shin MOHAMMAD L GARBA IMPRESSIONS  1. Aortic valve not well visualized but possibly bicuspid; severe AS (mean gradient 41 mmHg; AVA 0.56 cm2; DI 0.18); AS worse compared to 11/15/23.  2. Left ventricular ejection fraction, by estimation, is 60 to 65%. The left ventricle has normal function. The left ventricle has no regional wall motion abnormalities. The left ventricular internal cavity size was mildly dilated. Left ventricular diastolic parameters are consistent with Grade I diastolic dysfunction (impaired relaxation).  3. Right ventricular systolic function is normal. The right ventricular size is normal.  4. The mitral valve is normal in structure. No evidence of mitral valve regurgitation. No evidence of mitral stenosis.  5. The aortic valve has an indeterminant number of cusps. Aortic valve regurgitation is not visualized. Severe aortic valve stenosis.  6. Aortic dilatation noted. There is mild dilatation of the aortic root, measuring 40 mm.  7. The inferior vena cava is normal in size with  greater than 50% respiratory variability, suggesting right atrial pressure of 3 mmHg. FINDINGS  Left Ventricle: Left  ventricular ejection fraction, by estimation, is 60 to 65%. The left ventricle has normal function. The left ventricle has no regional wall motion abnormalities. Definity contrast agent was given IV to delineate the left ventricular  endocardial borders. Strain imaging was not performed. The left ventricular internal cavity size was mildly dilated. There is no left ventricular hypertrophy. Left ventricular diastolic parameters are consistent with Grade I diastolic dysfunction (impaired relaxation). Right Ventricle: The right ventricular size is normal. Right ventricular systolic function is normal. Left Atrium: Left atrial size was normal in size. Right Atrium: Right atrial size was normal in size. Pericardium: There is no evidence of pericardial effusion. Mitral Valve: The mitral valve is normal in structure. Mild mitral annular calcification. No evidence of mitral valve regurgitation. No evidence of mitral valve stenosis. Tricuspid Valve: The tricuspid valve is normal in structure. Tricuspid valve regurgitation is not demonstrated. No evidence of tricuspid stenosis. Aortic Valve: The aortic valve has an indeterminant number of cusps. Aortic valve regurgitation is not visualized. Severe aortic stenosis is present. Aortic valve mean gradient measures 41.4 mmHg. Aortic valve peak gradient measures 67.7 mmHg. Aortic valve area, by VTI measures 0.56 cm. Pulmonic Valve: The pulmonic valve was not well visualized. Pulmonic valve regurgitation is not visualized. No evidence of pulmonic stenosis. Aorta: Aortic dilatation noted. There is mild dilatation of the aortic root, measuring 40 mm. Venous: The inferior vena cava is normal in size with greater than 50% respiratory variability, suggesting right atrial pressure of 3 mmHg. IAS/Shunts: No atrial level shunt detected by color flow Doppler. Additional  Comments: Aortic valve not well visualized but possibly bicuspid; severe AS (mean gradient 41 mmHg; AVA 0.56 cm2; DI 0.18); AS worse compared to 11/15/23. 3D imaging was not performed.  LEFT VENTRICLE PLAX 2D LVIDd:         5.50 cm      Diastology LVIDs:         3.40 cm      LV e' medial:    5.98 cm/s LV PW:         1.10 cm      LV E/e' medial:  13.0 LV IVS:        0.80 cm      LV e' lateral:   8.49 cm/s LVOT diam:     2.00 cm      LV E/e' lateral: 9.2 LV SV:         53 LV SV Index:   23 LVOT Area:     3.14 cm  LV Volumes (MOD) LV vol d, MOD A2C: 131.0 ml LV vol d, MOD A4C: 107.0 ml LV vol s, MOD A2C: 64.0 ml LV vol s, MOD A4C: 56.9 ml LV SV MOD A2C:     67.0 ml LV SV MOD A4C:     107.0 ml LV SV MOD BP:      56.6 ml RIGHT VENTRICLE            IVC RV S prime:     9.79 cm/s  IVC diam: 1.70 cm TAPSE (M-mode): 3.5 cm LEFT ATRIUM             Index        RIGHT ATRIUM           Index LA diam:        3.90 cm 1.72 cm/m   RA Area:     10.50 cm LA Vol (A2C):   42.6 ml 18.75 ml/m  RA Volume:   17.70  ml  7.79 ml/m LA Vol (A4C):   40.8 ml 17.96 ml/m LA Biplane Vol: 44.7 ml 19.68 ml/m  AORTIC VALVE AV Area (Vmax):    0.61 cm AV Area (Vmean):   0.57 cm AV Area (VTI):     0.56 cm AV Vmax:           411.40 cm/s AV Vmean:          301.400 cm/s AV VTI:            0.940 m AV Peak Grad:      67.7 mmHg AV Mean Grad:      41.4 mmHg LVOT Vmax:         80.00 cm/s LVOT Vmean:        54.700 cm/s LVOT VTI:          0.169 m LVOT/AV VTI ratio: 0.18  AORTA Ao Root diam: 3.20 cm Ao Asc diam:  4.00 cm MITRAL VALVE MV Area (PHT): 3.48 cm    SHUNTS MV Decel Time: 218 msec    Systemic VTI:  0.17 m MV E velocity: 78.00 cm/s  Systemic Diam: 2.00 cm MV A velocity: 83.60 cm/s MV E/A ratio:  0.93 Olga Millers MD Electronically signed by Olga Millers MD Signature Date/Time: 11/15/2023/2:08:30 PM    Final    DG Chest Port 1 View Result Date: 11/14/2023 CLINICAL DATA:  Two-week history of cough and shortness of breath EXAM: PORTABLE CHEST 1  VIEW COMPARISON:  Chest radiograph dated 06/28/2023 FINDINGS: Normal lung volumes. No focal consolidations. No pleural effusion or pneumothorax. The heart size and mediastinal contours are within normal limits. No acute osseous abnormality. IMPRESSION: No acute disease. Electronically Signed   By: Agustin Cree M.D.   On: 11/14/2023 16:42   CT CHEST ABDOMEN PELVIS WO CONTRAST Result Date: 11/06/2023 CLINICAL DATA:  Kidney cancer, metastatic disease evaluation. * Tracking Code: BO * EXAM: CT CHEST, ABDOMEN AND PELVIS WITHOUT CONTRAST TECHNIQUE: Multidetector CT imaging of the chest, abdomen and pelvis was performed following the standard protocol without IV contrast. RADIATION DOSE REDUCTION: This exam was performed according to the departmental dose-optimization program which includes automated exposure control, adjustment of the mA and/or kV according to patient size and/or use of iterative reconstruction technique. COMPARISON:  Multiple priors including most recent CT July 04, 2023 FINDINGS: CT CHEST FINDINGS Cardiovascular: Aortic atherosclerosis. 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 Coronary artery calcifications. Calcifications of the aortic valve. Normal size heart. No significant pericardial effusion/thickening. Mediastinum/Nodes: Prominent partially visualized low cervical lymph nodes are similar prior examination. No suspicious thyroid nodule. Prominent mediastinal lymph nodes are stable from prior examination. No pathologically enlarged mediastinal, hilar or axillary lymph nodes. The esophagus is grossly unremarkable. Lungs/Pleura: Stable scattered pulmonary nodules for instance a 2 mm nodule in the right lung apex on image 42/6. No new suspicious pulmonary nodules or masses. Musculoskeletal: No aggressive lytic or blastic lesion of bone. Multilevel degenerative changes spine. CT ABDOMEN PELVIS FINDINGS Hepatobiliary: No suspicious hepatic lesion on noncontrast enhanced  examination of the liver. Gallbladder is unremarkable. No biliary ductal dilation. Pancreas: No pancreatic ductal dilation or evidence of acute inflammation Spleen: No splenomegaly. Adrenals/Urinary Tract: No suspicious adrenal nodule/mass. Left kidney is surgically absent without new suspicious nodularity in the nephrectomy bed. Unremarkable noncontrast enhanced appearance of the right kidney without hydronephrosis. Urinary bladder is unremarkable for degree of distension. Stomach/Bowel: No radiopaque enteric contrast material was administered. Stomach is unremarkable for degree of distension. Normal appendix. Colonic diverticulosis.  Vascular/Lymphatic: Normal caliber abdominal aorta. Aortic atherosclerosis. Smooth IVC contours. Stable 14 mm portacaval lymph node on image 66/2. No concerning abdominal or pelvic adenopathy. Reproductive: Prostate is unremarkable. Other: No significant abdominopelvic free fluid. Small fat containing ventral hernias. Postsurgical change in the abdominal wall. Musculoskeletal: No aggressive lytic or blastic lesion of bone. Chronic bilateral pars defects at L5. IMPRESSION: 1. Stable examination status post left nephrectomy without evidence of local recurrence or metastatic disease in the chest, abdomen or pelvis on this noncontrast enhanced examination. 2. 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 3.  Aortic Atherosclerosis (ICD10-I70.0). Electronically Signed   By: Maudry Mayhew M.D.   On: 11/06/2023 15:05     Subjective: No acute issues or events overnight   Discharge Exam: Vitals:   11/17/23 1229 11/17/23 1315  BP: 120/79   Pulse: 73   Resp: 20   Temp: (!) 97.5 F (36.4 C)   SpO2: 94% 92%   Vitals:   11/17/23 0559 11/17/23 0822 11/17/23 1229 11/17/23 1315  BP: 115/85  120/79   Pulse: 73  73   Resp: 18  20   Temp: (!) 97.5 F (36.4 C)  (!) 97.5 F (36.4 C)   TempSrc: Oral  Oral   SpO2: 99% 92% 94% 92%    General: Pt is alert,  awake, not in acute distress Cardiovascular: RRR, S1/S2 +, no rubs, no gallops Respiratory: CTA bilaterally, no wheezing, no rhonchi Abdominal: Soft, NT, ND, bowel sounds + Extremities: no edema, no cyanosis    The results of significant diagnostics from this hospitalization (including imaging, microbiology, ancillary and laboratory) are listed below for reference.     Microbiology: Recent Results (from the past 240 hours)  Resp panel by RT-PCR (RSV, Flu A&B, Covid) Anterior Nasal Swab     Status: None   Collection Time: 11/14/23  4:35 PM   Specimen: Anterior Nasal Swab  Result Value Ref Range Status   SARS Coronavirus 2 by RT PCR NEGATIVE NEGATIVE Final    Comment: (NOTE) SARS-CoV-2 target nucleic acids are NOT DETECTED.  The SARS-CoV-2 RNA is generally detectable in upper respiratory specimens during the acute phase of infection. The lowest concentration of SARS-CoV-2 viral copies this assay can detect is 138 copies/mL. A negative result does not preclude SARS-Cov-2 infection and should not be used as the sole basis for treatment or other patient management decisions. A negative result may occur with  improper specimen collection/handling, submission of specimen other than nasopharyngeal swab, presence of viral mutation(s) within the areas targeted by this assay, and inadequate number of viral copies(<138 copies/mL). A negative result must be combined with clinical observations, patient history, and epidemiological information. The expected result is Negative.  Fact Sheet for Patients:  BloggerCourse.com  Fact Sheet for Healthcare Providers:  SeriousBroker.it  This test is no t yet approved or cleared by the [place] FDA and  has been authorized for detection and/or diagnosis of SARS-CoV-2 by FDA under an Emergency Use Authorization (EUA). This EUA will remain  in effect (meaning this test can be used) for the duration  of the COVID-19 declaration under Section 564(b)(1) of the Act, 21 U.S.C.section 360bbb-3(b)(1), unless the authorization is terminated  or revoked sooner.       Influenza A by PCR NEGATIVE NEGATIVE Final   Influenza B by PCR NEGATIVE NEGATIVE Final    Comment: (NOTE) The Xpert Xpress SARS-CoV-2/FLU/RSV plus assay is intended as an aid in the diagnosis of influenza from Nasopharyngeal swab specimens  and should not be used as a sole basis for treatment. Nasal washings and aspirates are unacceptable for Xpert Xpress SARS-CoV-2/FLU/RSV testing.  Fact Sheet for Patients: BloggerCourse.com  Fact Sheet for Healthcare Providers: SeriousBroker.it  This test is not yet approved or cleared by the [place] FDA and has been authorized for detection and/or diagnosis of SARS-CoV-2 by FDA under an Emergency Use Authorization (EUA). This EUA will remain in effect (meaning this test can be used) for the duration of the COVID-19 declaration under Section 564(b)(1) of the Act, 21 U.S.C. section 360bbb-3(b)(1), unless the authorization is terminated or revoked.     Resp Syncytial Virus by PCR NEGATIVE NEGATIVE Final    Comment: (NOTE) Fact Sheet for Patients: BloggerCourse.com  Fact Sheet for Healthcare Providers: SeriousBroker.it  This test is not yet approved or cleared by the [place] FDA and has been authorized for detection and/or diagnosis of SARS-CoV-2 by FDA under an Emergency Use Authorization (EUA). This EUA will remain in effect (meaning this test can be used) for the duration of the COVID-19 declaration under Section 564(b)(1) of the Act, 21 U.S.C. section 360bbb-3(b)(1), unless the authorization is terminated or revoked.  Performed at [facility], [address], [place], [place] [address]      Labs: BNP (last 3 results) Recent Labs     02/19/23 2027 06/28/23 0902 11/14/23 1635  BNP 17.8 53.6 30.6   Basic Metabolic Panel: Recent Labs  Lab 11/11/23 1304 11/14/23 1635 11/15/23 0033  NA 137 137 134*  K 4.4 4.5 5.0  CL 101 103 98  CO2 28 24 21*  GLUCOSE 151* 156* 236*  BUN 14 15 17   CREATININE 1.25* 1.36* 1.36*  CALCIUM 9.9 9.4 9.1   Liver Function Tests: Recent Labs  Lab 11/11/23 1304 11/14/23 1635 11/15/23 0033  AST 31 40 43*  ALT 18 25 25   ALKPHOS 57 52 55  BILITOT 0.3 0.6 0.8  PROT 7.2 7.5 8.1  ALBUMIN 4.8 4.4 4.6   No results for input(s): "LIPASE", "AMYLASE" in the last 168 hours. No results for input(s): "AMMONIA" in the last 168 hours. CBC: Recent Labs  Lab 11/11/23 1304 11/14/23 1635 11/15/23 0033  WBC 12.8* 13.8* 10.1  NEUTROABS 5.8 6.2  --   HGB 14.7 14.8 15.3  HCT 44.3 45.1 47.5  MCV 93.5 96.6 97.9  PLT 244 256 265   CBG: Recent Labs  Lab 11/16/23 1128 11/16/23 1715 11/16/23 2240 11/17/23 0740 11/17/23 1146  GLUCAP 147* 247* 327* 140* 149*   D-Dimer Recent Labs    11/14/23 1713  DDIMER <0.27   Hgb A1c Recent Labs    11/15/23 0033  HGBA1C 6.5*   Urinalysis    Component Value Date/Time   COLORURINE STRAW (A) 06/15/2022 0456   APPEARANCEUR CLEAR 06/15/2022 0456   LABSPEC 1.030 06/15/2022 0456   PHURINE 5.0 06/15/2022 0456   GLUCOSEU >=500 (A) 06/15/2022 0456   HGBUR NEGATIVE 06/15/2022 0456   BILIRUBINUR NEGATIVE 06/15/2022 0456   KETONESUR NEGATIVE 06/15/2022 0456   PROTEINUR NEGATIVE 06/15/2022 0456   NITRITE NEGATIVE 06/15/2022 0456   LEUKOCYTESUR NEGATIVE 06/15/2022 0456   Sepsis Labs Recent Labs  Lab 11/11/23 1304 11/14/23 1635 11/15/23 0033  WBC 12.8* 13.8* 10.1   Microbiology Recent Results (from the past 240 hours)  Resp panel by RT-PCR (RSV, Flu A&B, Covid) Anterior Nasal Swab     Status: None   Collection Time: 11/14/23  4:35 PM   Specimen: Anterior Nasal Swab  Result Value Ref  Range Status   SARS Coronavirus 2 by RT PCR NEGATIVE  NEGATIVE Final    Comment: (NOTE) SARS-CoV-2 target nucleic acids are NOT DETECTED.  The SARS-CoV-2 RNA is generally detectable in upper respiratory specimens during the acute phase of infection. The lowest concentration of SARS-CoV-2 viral copies this assay can detect is 138 copies/mL. A negative result does not preclude SARS-Cov-2 infection and should not be used as the sole basis for treatment or other patient management decisions. A negative result may occur with  improper specimen collection/handling, submission of specimen other than nasopharyngeal swab, presence of viral mutation(s) within the areas targeted by this assay, and inadequate number of viral copies(<138 copies/mL). A negative result must be combined with clinical observations, patient history, and epidemiological information. The expected result is Negative.  Fact Sheet for Patients:  BloggerCourse.com  Fact Sheet for Healthcare Providers:  SeriousBroker.it  This test is no t yet approved or cleared by the [place] FDA and  has been authorized for detection and/or diagnosis of SARS-CoV-2 by FDA under an Emergency Use Authorization (EUA). This EUA will remain  in effect (meaning this test can be used) for the duration of the COVID-19 declaration under Section 564(b)(1) of the Act, 21 U.S.C.section 360bbb-3(b)(1), unless the authorization is terminated  or revoked sooner.       Influenza A by PCR NEGATIVE NEGATIVE Final   Influenza B by PCR NEGATIVE NEGATIVE Final    Comment: (NOTE) The Xpert Xpress SARS-CoV-2/FLU/RSV plus assay is intended as an aid in the diagnosis of influenza from Nasopharyngeal swab specimens and should not be used as a sole basis for treatment. Nasal washings and aspirates are unacceptable for Xpert Xpress SARS-CoV-2/FLU/RSV testing.  Fact Sheet for Patients: BloggerCourse.com  Fact Sheet for Healthcare  Providers: SeriousBroker.it  This test is not yet approved or cleared by the [place] FDA and has been authorized for detection and/or diagnosis of SARS-CoV-2 by FDA under an Emergency Use Authorization (EUA). This EUA will remain in effect (meaning this test can be used) for the duration of the COVID-19 declaration under Section 564(b)(1) of the Act, 21 U.S.C. section 360bbb-3(b)(1), unless the authorization is terminated or revoked.     Resp Syncytial Virus by PCR NEGATIVE NEGATIVE Final    Comment: (NOTE) Fact Sheet for Patients: BloggerCourse.com  Fact Sheet for Healthcare Providers: SeriousBroker.it  This test is not yet approved or cleared by the [place] FDA and has been authorized for detection and/or diagnosis of SARS-CoV-2 by FDA under an Emergency Use Authorization (EUA). This EUA will remain in effect (meaning this test can be used) for the duration of the COVID-19 declaration under Section 564(b)(1) of the Act, 21 U.S.C. section 360bbb-3(b)(1), unless the authorization is terminated or revoked.  Performed at [facility], [address] [place], [place] [address]      Time coordinating discharge: Over 30 minutes  SIGNED:   Azucena Fallen, DO [facility] 11/17/2023, 1:55 PM Pager   If 7PM-7AM, please contact night-coverage [email or web address]

## 2023-11-18 DIAGNOSIS — J4541 Moderate persistent asthma with (acute) exacerbation: Secondary | ICD-10-CM | POA: Diagnosis not present

## 2023-11-18 DIAGNOSIS — J9601 Acute respiratory failure with hypoxia: Secondary | ICD-10-CM | POA: Diagnosis not present

## 2023-11-18 LAB — GLUCOSE, CAPILLARY
Glucose-Capillary: 110 mg/dL — ABNORMAL HIGH (ref 70–99)
Glucose-Capillary: 191 mg/dL — ABNORMAL HIGH (ref 70–99)
Glucose-Capillary: 302 mg/dL — ABNORMAL HIGH (ref 70–99)
Glucose-Capillary: 265 mg/dL — ABNORMAL HIGH (ref 70–99)

## 2023-11-18 LAB — PROCALCITONIN: Procalcitonin: <0.1 ng/mL

## 2023-11-18 MED ORDER — BUDESON-GLYCOPYRROL-FORMOTEROL 160-9-4.8 MCG/ACT IN AERO: 2.0000 | INHALATION_SPRAY | Freq: Two times a day (BID) | RESPIRATORY_TRACT | Status: DC

## 2023-11-18 MED ORDER — IPRATROPIUM-ALBUTEROL 0.5-2.5 (3) MG/3ML IN SOLN
3.0000 mL | Freq: Four times a day (QID) | RESPIRATORY_TRACT | Status: DC
  Administered 2023-11-19 – 2023-11-20 (×5): 3 mL via RESPIRATORY_TRACT
  Filled 2023-11-18 (×5): qty 3

## 2023-11-18 MED ORDER — FLUTICASONE FUROATE-VILANTEROL 200-25 MCG/ACT IN AEPB
1.0000 | INHALATION_SPRAY | Freq: Every day | RESPIRATORY_TRACT | Status: DC
  Administered 2023-11-19 – 2023-11-20 (×2): 1 via RESPIRATORY_TRACT
  Filled 2023-11-18: qty 28

## 2023-11-18 MED ORDER — UMECLIDINIUM BROMIDE 62.5 MCG/ACT IN AEPB
1.0000 | INHALATION_SPRAY | Freq: Every day | RESPIRATORY_TRACT | Status: DC
Start: 2023-11-19 — End: 2023-11-20
  Administered 2023-11-19 – 2023-11-20 (×2): 1 via RESPIRATORY_TRACT
  Filled 2023-11-18: qty 7

## 2023-11-18 MED ORDER — LEVOTHYROXINE SODIUM 25 MCG PO TABS
25.0000 ug | ORAL_TABLET | Freq: Every day | ORAL | Status: DC
  Administered 2023-11-19 – 2023-11-20 (×2): 25 ug via ORAL
  Filled 2023-11-18 (×2): qty 1

## 2023-11-18 NOTE — Telephone Encounter (Signed)
 Pt still in hosp-will call to schedule per MS

## 2023-11-18 NOTE — Consult Note (Signed)
 NAME:  Russell Burns, MRN:  [id number], DOB:  1965-12-25, LOS: 4 ADMISSION DATE:  11/14/2023, CONSULTATION DATE:  11/18/2023 REFERRING MD:  Dr Natale Milch - [facility], CHIEF COMPLAINT:  Dyspnea and hypoxia    History of Present Illness:  Russell Burns is a 58 year old male with a past medical history significant for severe aortic stenosis, asthmatic bronchitis, type 2 diabetes, renal cell carcinoma s/p nephrectomy no in remission, and prior history of occupational lung disease who presented to the ER with complaints of shortness of breath and persistent cough.  Patient was admitted per hospitalist service for management of acute hypoxic respiratory failure likely secondary to acute asthmatic bronchitis versus COPD exacerbation.  He received 5 days of CAP coverage and high-dose steroids without complete resolution of dyspnea therefore PCCM consult for further assistance in management.  Pertinent  Medical History  Severe aortic stenosis, asthmatic bronchitis, type 2 diabetes, renal cell carcinoma s/p nephrectomy no in remission, and prior history of occupational lung disease  Significant Hospital Events: Including procedures, antibiotic start and stop dates in addition to other pertinent events   2/20 presented with complaints of shortness of breath and cough 2/21 echocardiogram with poor visualization of aortic valve but valve continues to appear bicuspid with severe aortic stenosis, mean gradient 41 mmHg, worse compared to echo 10/2022 2/24 continues to have intermittent sudden onset episodes of chest discomfort and hypoxia resulting in PCCM consult.  Interim History / Subjective:  Seen sitting up in bedside recliner preparing to eat lunch.  States he feels well at this moment but reported episode of significant, sudden onset,mid sternal chest pain with associated hypoxia this morning.  States he was awoken from sleep with pain around 7:30 AM  Objective   Blood pressure 127/81, pulse 65,  temperature (!) 97.5 F (36.4 C), temperature source Oral, resp. rate 20, SpO2 95%.        Intake/Output Summary (Last 24 hours) at 11/18/2023 1006 Last data filed at 11/17/2023 1700 Gross per 24 hour  Intake 600 ml  Output --  Net 600 ml   There were no vitals filed for this visit.  Examination: General: Well-appearing middle-aged male sitting up in bedside recliner no acute distress HEENT: [place]/AT, MM pink/moist, PERRL,  Neuro: Alert and oriented x 3, nonfocal CV: s1s2 regular rate and rhythm, no murmur, rubs, or gallops,  PULM: Clear to auscultation bilaterally, no increased work of breathing, no added breath sounds on 2 L nasal cannula GI: soft, bowel sounds active in all 4 quadrants, non-tender, non-distended, tolerating oral diet Extremities: warm/dry, no edema  Skin: no rashes or lesions   Resolved Hospital Problem list     Assessment & Plan:  Acute hypoxic respiratory failure -Reports the episodes of significant, mid sternal chest pain with associated hypoxia occur very suddenly and and will happen during rest. The last episode woke him from sleep  History of asthma with concern for acute exacerbation on admission versus -Currently receiving ceftriaxone with plans to cover 5 days. On Breztri and Dupixent at baseline History of occupational lung disease Severe aortic stenosis -2/21 echocardiogram with poor visualization of aortic valve but valve continues to appear bicuspid with severe aortic stenosis, mean gradient 41 mmHg, worse compared to echo 10/2022 P: No acute signs of significant atypical pneumonia, patient remains afebrile with resolved leukocytosis.  Chest x-ray with no active disease Reasonable to continue empiric ceftriaxone for 5 days  Not wheezing on exam, can likely stop steroids after 5 days of coverage Concern for cardiac  component leading to hypoxia given severe aortic stenosis, recommend inpatient cardiology consult with further workup Check  procalcitonin Wean supplemental oxygen as able Mobilize as able   Best Practice (right click and "Reselect all SmartList Selections" daily)  Per primary   Labs   CBC: Recent Labs  Lab 11/11/23 1304 11/14/23 1635 11/15/23 0033  WBC 12.8* 13.8* 10.1  NEUTROABS 5.8 6.2  --   HGB 14.7 14.8 15.3  HCT 44.3 45.1 47.5  MCV 93.5 96.6 97.9  PLT 244 256 265    Basic Metabolic Panel: Recent Labs  Lab 11/11/23 1304 11/14/23 1635 11/15/23 0033  NA 137 137 134*  K 4.4 4.5 5.0  CL 101 103 98  CO2 28 24 21*  GLUCOSE 151* 156* 236*  BUN 14 15 17   CREATININE 1.25* 1.36* 1.36*  CALCIUM 9.9 9.4 9.1   GFR: Estimated Creatinine Clearance: 74.7 mL/min (A) (by C-G formula based on SCr of 1.36 mg/dL (H)). Recent Labs  Lab 11/11/23 1304 11/14/23 1635 11/15/23 0033  WBC 12.8* 13.8* 10.1    Liver Function Tests: Recent Labs  Lab 11/11/23 1304 11/14/23 1635 11/15/23 0033  AST 31 40 43*  ALT 18 25 25   ALKPHOS 57 52 55  BILITOT 0.3 0.6 0.8  PROT 7.2 7.5 8.1  ALBUMIN 4.8 4.4 4.6   No results for input(s): "LIPASE", "AMYLASE" in the last 168 hours. No results for input(s): "AMMONIA" in the last 168 hours.  ABG    Component Value Date/Time   HCO3 23.1 06/28/2023 0903   TCO2 26 11/26/2022 1633   ACIDBASEDEF 1.8 06/28/2023 0903   O2SAT 86 06/28/2023 0903     Coagulation Profile: No results for input(s): "INR", "PROTIME" in the last 168 hours.  Cardiac Enzymes: No results for input(s): "CKTOTAL", "CKMB", "CKMBINDEX", "TROPONINI" in the last 168 hours.  HbA1C: Hgb A1c MFr Bld  Date/Time Value Ref Range Status  11/15/2023 12:33 AM 6.5 (H) 4.8 - 5.6 % Final    Comment:    (NOTE) Pre diabetes:          5.7%-6.4%  Diabetes:              >6.4%  Glycemic control for   <7.0% adults with diabetes   02/20/2023 12:51 AM 6.7 (H) 4.8 - 5.6 % Final    Comment:    (NOTE)         Prediabetes: 5.7 - 6.4         Diabetes: >6.4         Glycemic control for adults with  diabetes: <7.0     CBG: Recent Labs  Lab 11/17/23 0740 11/17/23 1146 11/17/23 1650 11/17/23 2045 11/18/23 0743  GLUCAP 140* 149* 227* 328* 110*    Review of Systems:   Please see the history of present illness. All other systems reviewed and are negative    Past Medical History:  He,  has a past medical history of Allergy, Aortic stenosis, Arthritis, Blood transfusion without reported diagnosis, Cancer (HCC), Chronic kidney disease, Diabetes mellitus without complication (HCC), FH: thoracic aneurysm, Hyperlipidemia, Hypertension, and Obesity.   Surgical History:   Past Surgical History:  Procedure Laterality Date   CYSTOSCOPY WITH URETEROSCOPY AND STENT PLACEMENT Left 02/12/2022   Procedure: CYSTOSCOPY WITH LEFT RETROGRADE PYELOGRAM, DIAGNOSTIC URETEROSCOPY AND STENT PLACEMENT;  Surgeon: Crista Elliot, MD;  Location: [facility];  Service: Urology;  Laterality: Left;   LAPAROSCOPIC NEPHRECTOMY, HAND ASSISTED Left 03/12/2022   Procedure: LEFT HAND ASSISTED LAPAROSCOPIC NEPHRECTOMY;  Surgeon:  Crista Elliot, MD;  Location: [facility];  Service: Urology;  Laterality: Left;  NEED 2.5 HRS FOR THIS CASE   TONSILLECTOMY     WISDOM TOOTH EXTRACTION       Social History:   reports that he has never smoked. His smokeless tobacco use includes chew. He reports current alcohol use. He reports that he does not use drugs.   Family History:  His family history includes Aneurysm in his maternal grandfather and maternal uncle; Aneurysm (age of onset: 60) in his cousin; CAD in an other family member; Diabetes Mellitus II in his father and another family member; Heart attack in his father; Heart attack (age of onset: 65) in his paternal grandfather; Heart attack (age of onset: 73) in his paternal uncle; Heart attack (age of onset: 21) in his maternal grandmother; Heart failure in an other family member; Hypertension in his mother, paternal uncle, and another family member. There is no history of  Colon polyps, Esophageal cancer, Rectal cancer, or Stomach cancer.   Allergies Allergies  Allergen Reactions   Bee Venom Anaphylaxis   Cat Dander Itching, Swelling and Other (See Comments)    Runny nose, itchy/puffy eyes,      Home Medications  Prior to Admission medications   Medication Sig Start Date End Date Taking? Authorizing Provider  acetaminophen (TYLENOL) 500 MG tablet Take 1,000 mg by mouth in the morning.   Yes [provider]  albuterol (VENTOLIN HFA) 108 (90 Base) MCG/ACT inhaler Inhale 2 puffs into the lungs every 6 (six) hours as needed for wheezing or shortness of breath. 12/11/21  Yes [provider]  amLODipine (NORVASC) 10 MG tablet Take 10 mg by mouth daily. 08/01/20  Yes [provider]  atorvastatin (LIPITOR) 80 MG tablet Take 80 mg by mouth daily.   Yes [provider]  azithromycin (ZITHROMAX) 500 MG tablet Take 1 tablet (500 mg total) by mouth daily for 2 days. 11/17/23 11/19/23 Yes Azucena Fallen, MD  BENADRYL ALLERGY 25 MG tablet Take 50 mg by mouth 3 (three) times daily.   Yes [provider]  benzonatate (TESSALON) 200 MG capsule Take 1 capsule (200 mg total) by mouth 3 (three) times daily as needed. Patient taking differently: Take 200 mg by mouth 3 (three) times daily as needed for cough. 07/02/23  Yes Kathlen Mody, MD  Budeson-Glycopyrrol-Formoterol (BREZTRI AEROSPHERE) 160-9-4.8 MCG/ACT AERO Inhale 2 puffs into the lungs 2 (two) times daily. Take 2 puffs first thing in am and then another 2 puffs about 12 hours later. 12/12/22  Yes Nyoka Cowden, MD  cetirizine (ZYRTEC) 10 MG tablet Take 10 mg by mouth 2 (two) times daily.   Yes [provider]  DELSYM 30 MG/5ML liquid Take 15-30 mg by mouth in the morning and at bedtime.   Yes [provider]  Dextromethorphan-guaiFENesin (MUCINEX DM MAXIMUM STRENGTH) 60-1200 MG TB12 Take 1 tablet by mouth in the morning and at bedtime.   Yes [provider]  EPINEPHrine 0.3 mg/0.3 mL IJ SOAJ injection Inject 0.3 mg into the muscle as needed for anaphylaxis.   Yes [provider]  ezetimibe (ZETIA) 10 MG tablet Take 10 mg by mouth daily. 08/01/20  Yes [provider]  famotidine (PEPCID) 20 MG tablet TAKE 1 TABLET BY MOUTH EVERY DAY AFTER SUPPER Patient taking differently: Take 20 mg by mouth every evening. 06/26/23  Yes Nyoka Cowden, MD  fluticasone (FLONASE) 50 MCG/ACT nasal spray Place 2 sprays into both  nostrils in the morning and at bedtime.   Yes [provider]  ipratropium-albuterol (DUONEB) 0.5-2.5 (3) MG/3ML SOLN Take 3 mLs by nebulization every 3 (three) hours as needed (shortness of breath or wheezing). 11/22/22  Yes [provider]  JARDIANCE 25 MG TABS tablet Take 25 mg by mouth daily. 07/10/20  Yes [provider]  levothyroxine (SYNTHROID) 25 MCG tablet Take 1 tablet (25 mcg total) by mouth daily before breakfast. 12/07/22  Yes Heilingoetter, Cassandra L, PA-C  losartan (COZAAR) 100 MG tablet Take 100 mg by mouth daily.   Yes [provider]  metFORMIN (GLUCOPHAGE) 1000 MG tablet Take 1,000 mg by mouth in the morning and at bedtime.   Yes [provider]  nystatin (MYCOSTATIN) 100000 UNIT/ML suspension Take 5 mLs by mouth 4 (four) times daily as needed (as directed for thrush).   Yes [provider]  predniSONE (DELTASONE) 10 MG tablet Take 4 tablets (40 mg total) by mouth daily for 3 days, THEN 3 tablets (30 mg total) daily for 5 days, THEN 2 tablets (20 mg total) daily for 5 days, THEN 1 tablet (10 mg total) daily for 5 days. 11/17/23 12/05/23 Yes Azucena Fallen, MD  prochlorperazine (COMPAZINE) 10 MG tablet Take 1 tablet (10 mg total) by mouth every 6 (six) hours as needed. Patient taking differently: Take 10 mg by mouth every 6 (six) hours as needed for nausea or vomiting. 12/27/22  Yes Si Gaul, MD  promethazine-dextromethorphan  (PROMETHAZINE-DM) 6.25-15 MG/5ML syrup Take 10 mLs by mouth at bedtime as needed for cough.   Yes [provider]  REPATHA 140 MG/ML SOSY Inject 140 mg into the skin every 14 (fourteen) days.   Yes [provider]  tamsulosin (FLOMAX) 0.4 MG CAPS capsule Take 1 capsule (0.4 mg total) by mouth daily. 02/12/22  Yes Crista Elliot, MD  tirzepatide [facility]) 10 MG/0.5ML Pen Inject 10 mg into the skin once a week. Patient taking differently: Inject 10 mg into the skin every Friday. 11/14/21  Yes   albuterol (PROVENTIL) (2.5 MG/3ML) 0.083% nebulizer solution Take 3 mLs (2.5 mg total) by nebulization every 4 (four) hours as needed for wheezing or shortness of breath. 11/17/23   Azucena Fallen, MD  Dupilumab (DUPIXENT) 200 MG/1. SOAJ Inject 200 mg into the skin every 14 (fourteen) days. Patient not taking: Reported on 11/14/2023 10/09/23   Audie Box L, DO  HYDROcodone bit-homatropine (HYCODAN) 5-1.5 MG/5ML syrup Take 5 mLs by mouth every 6 (six) hours as needed for cough. 11/17/23   Azucena Fallen, MD  [facility] VERIO test strip 1 each by Other route 5 (five) times daily. 10/31/22   [provider]  pantoprazole (PROTONIX) 40 MG tablet Take 1 tablet (40 mg total) by mouth 2 (two) times daily. Patient not taking: Reported on 11/14/2023 02/20/23   Azucena Fallen, MD     Critical care time:  NA  Krishika Bugge D. Harris, NP-C [facility] Personal contact information can be found on Amion  If no contact or response made please call [phone] 11/18/2023, 1:00 PM

## 2023-11-18 NOTE — Consult Note (Signed)
 Cardiology Consultation   Patient ID: Russell Burns MRN: [id number]; DOB: [birth year]  Admit date: 11/14/2023 Date of Consult: 11/18/2023  PCP:  [name], MD   [facility] Providers Cardiologist:  Donato Schultz, MD        Patient Profile:   Russell Burns is a 58 y.o. male with a hx of significant for severe aortic stenosis, asthmatic bronchitis, type 2 diabetes, renal cell carcinoma s/p nephrectomy no in remission, and prior history of occupational lung disease who presented to the ER with complaints of shortness of breath and persistent cough.  who is being seen 11/18/2023 for the evaluation of SOB with severe AS at the request of Dr. Natale Milch.  History of Present Illness:   Mr. Russell Burns presented here with acute hypoxic respiratory failure in the setting of ??asthmatic bronchitis versus COPD exacerbation.  He was managed for CAP as well.  Cardiology was engaged because he has severe AS and the team wanted to determine whether this is contributing to his hypoxemia.  He has a mean gradient of 41 mmhg, AVA of 0.56 cm2, dimensionless index of 0.18.  He is a patient of Dr. Anne Fu.  He has had a prior pharmacologic stress with an elevated CAC of 405, that showed no ischemia in 2022.  He underwent another coronary CTA with a CAC of 530.  No flow-limiting obstructive lesions. he has known AS and the concern is possible BAV, concern for Sievers 1 right to left calcified raphe.  He has a stable thoracic aneurysm.  In May 2024, his AS was noted to be severe. Here he is normal sinus rhythm and his blood pressure is normal.  He is on 4 L of O2.  His BNP was negative.  He has no ischemic changes on his EKG.  Troponin was negative.  Chest x-ray did not show any focal defects.  After an assessment with pulmonology, he was deemed that he unlikely has pulmonary pathology.  CT chest is pending.  Therefore he and I discussed a right heart cath.   Past Medical History:  Diagnosis Date    Allergy    Aortic stenosis    Arthritis    Blood transfusion without reported diagnosis    Cancer (HCC)    Chronic kidney disease    Diabetes mellitus without complication (HCC)    FH: thoracic aneurysm    Hyperlipidemia    Hypertension    Obesity     Past Surgical History:  Procedure Laterality Date   CYSTOSCOPY WITH URETEROSCOPY AND STENT PLACEMENT Left 02/12/2022   Procedure: CYSTOSCOPY WITH LEFT RETROGRADE PYELOGRAM, DIAGNOSTIC URETEROSCOPY AND STENT PLACEMENT;  Surgeon: Crista Elliot, MD;  Location: [facility];  Service: Urology;  Laterality: Left;   LAPAROSCOPIC NEPHRECTOMY, HAND ASSISTED Left 03/12/2022   Procedure: LEFT HAND ASSISTED LAPAROSCOPIC NEPHRECTOMY;  Surgeon: Crista Elliot, MD;  Location: [facility];  Service: Urology;  Laterality: Left;  NEED 2.5 HRS FOR THIS CASE   TONSILLECTOMY     WISDOM TOOTH EXTRACTION       Home Medications:  Prior to Admission medications   Medication Sig Start Date End Date Taking? Authorizing Provider  acetaminophen (TYLENOL) 500 MG tablet Take 1,000 mg by mouth in the morning.   Yes [provider]  albuterol (VENTOLIN HFA) 108 (90 Base) MCG/ACT inhaler Inhale 2 puffs into the lungs every 6 (six) hours as needed for wheezing or shortness of breath. 12/11/21  Yes [provider]  amLODipine (NORVASC) 10 MG  tablet Take 10 mg by mouth daily. 08/01/20  Yes [provider]  atorvastatin (LIPITOR) 80 MG tablet Take 80 mg by mouth daily.   Yes [provider]  azithromycin (ZITHROMAX) 500 MG tablet Take 1 tablet (500 mg total) by mouth daily for 2 days. 11/17/23 11/19/23 Yes Azucena Fallen, MD  BENADRYL ALLERGY 25 MG tablet Take 50 mg by mouth 3 (three) times daily.   Yes [provider]  benzonatate (TESSALON) 200 MG capsule Take 1 capsule (200 mg total) by mouth 3 (three) times daily as needed. Patient taking differently: Take 200 mg by mouth 3 (three) times daily as needed for cough. 07/02/23  Yes  Kathlen Mody, MD  Budeson-Glycopyrrol-Formoterol (BREZTRI AEROSPHERE) 160-9-4.8 MCG/ACT AERO Inhale 2 puffs into the lungs 2 (two) times daily. Take 2 puffs first thing in am and then another 2 puffs about 12 hours later. 12/12/22  Yes Nyoka Cowden, MD  cetirizine (ZYRTEC) 10 MG tablet Take 10 mg by mouth 2 (two) times daily.   Yes [provider]  DELSYM 30 MG/5ML liquid Take 15-30 mg by mouth in the morning and at bedtime.   Yes [provider]  Dextromethorphan-guaiFENesin (MUCINEX DM MAXIMUM STRENGTH) 60-1200 MG TB12 Take 1 tablet by mouth in the morning and at bedtime.   Yes [provider]  EPINEPHrine 0.3 mg/0.3 mL IJ SOAJ injection Inject 0.3 mg into the muscle as needed for anaphylaxis.   Yes [provider]  ezetimibe (ZETIA) 10 MG tablet Take 10 mg by mouth daily. 08/01/20  Yes [provider]  famotidine (PEPCID) 20 MG tablet TAKE 1 TABLET BY MOUTH EVERY DAY AFTER SUPPER Patient taking differently: Take 20 mg by mouth every evening. 06/26/23  Yes Nyoka Cowden, MD  fluticasone (FLONASE) 50 MCG/ACT nasal spray Place 2 sprays into both nostrils in the morning and at bedtime.   Yes [provider]  ipratropium-albuterol (DUONEB) 0.5-2.5 (3) MG/3ML SOLN Take 3 mLs by nebulization every 3 (three) hours as needed (shortness of breath or wheezing). 11/22/22  Yes [provider]  JARDIANCE 25 MG TABS tablet Take 25 mg by mouth daily. 07/10/20  Yes [provider]  levothyroxine (SYNTHROID) 25 MCG tablet Take 1 tablet (25 mcg total) by mouth daily before breakfast. 12/07/22  Yes Heilingoetter, Cassandra L, PA-C  losartan (COZAAR) 100 MG tablet Take 100 mg by mouth daily.   Yes [provider]  metFORMIN (GLUCOPHAGE) 1000 MG tablet Take 1,000 mg by mouth in the morning and at bedtime.   Yes [provider]  nystatin (MYCOSTATIN) 100000 UNIT/ML suspension Take 5 mLs by mouth 4 (four) times daily as needed (as  directed for thrush).   Yes [provider]  predniSONE (DELTASONE) 10 MG tablet Take 4 tablets (40 mg total) by mouth daily for 3 days, THEN 3 tablets (30 mg total) daily for 5 days, THEN 2 tablets (20 mg total) daily for 5 days, THEN 1 tablet (10 mg total) daily for 5 days. 11/17/23 12/05/23 Yes Azucena Fallen, MD  prochlorperazine (COMPAZINE) 10 MG tablet Take 1 tablet (10 mg total) by mouth every 6 (six) hours as needed. Patient taking differently: Take 10 mg by mouth every 6 (six) hours as needed for nausea or vomiting. 12/27/22  Yes Si Gaul, MD  promethazine-dextromethorphan (PROMETHAZINE-DM) 6.25-15 MG/5ML syrup Take 10 mLs by mouth at bedtime as needed for cough.   Yes [provider]  REPATHA 140 MG/ML SOSY Inject 140 mg into the  skin every 14 (fourteen) days.   Yes [provider]  tamsulosin (FLOMAX) 0.4 MG CAPS capsule Take 1 capsule (0.4 mg total) by mouth daily. 02/12/22  Yes Crista Elliot, MD  tirzepatide [facility]) 10 MG/0.5ML Pen Inject 10 mg into the skin once a week. Patient taking differently: Inject 10 mg into the skin every Friday. 11/14/21  Yes   albuterol (PROVENTIL) (2.5 MG/3ML) 0.083% nebulizer solution Take 3 mLs (2.5 mg total) by nebulization every 4 (four) hours as needed for wheezing or shortness of breath. 11/17/23   Azucena Fallen, MD  Dupilumab (DUPIXENT) 200 MG/1. SOAJ Inject 200 mg into the skin every 14 (fourteen) days. Patient not taking: Reported on 11/14/2023 10/09/23   Audie Box L, DO  HYDROcodone bit-homatropine (HYCODAN) 5-1.5 MG/5ML syrup Take 5 mLs by mouth every 6 (six) hours as needed for cough. 11/17/23   Azucena Fallen, MD  [facility] VERIO test strip 1 each by Other route 5 (five) times daily. 10/31/22   [provider]  pantoprazole (PROTONIX) 40 MG tablet Take 1 tablet (40 mg total) by mouth 2 (two) times daily. Patient not taking: Reported on 11/14/2023 02/20/23   Azucena Fallen, MD     Inpatient Medications: Scheduled Meds:  amLODipine  10 mg Oral Daily   atorvastatin  80 mg Oral Daily   azithromycin  500 mg Oral Daily   empagliflozin  25 mg Oral Daily   enoxaparin (LOVENOX) injection  40 mg Subcutaneous Q24H   ezetimibe  10 mg Oral Daily   famotidine  20 mg Oral QPM   fluticasone  2 spray Each Nare Daily   insulin aspart  0-20 Units Subcutaneous TID WC   insulin aspart  0-5 Units Subcutaneous QHS   ipratropium-albuterol  3 mL Nebulization Q4H   losartan  100 mg Oral Daily   predniSONE  40 mg Oral Q breakfast   tamsulosin  0.4 mg Oral Daily   Continuous Infusions:  cefTRIAXone (ROCEPHIN)  IV 1 g (11/17/23 2254)   PRN Meds: albuterol, benzonatate, guaiFENesin, traZODone  Allergies:    Allergies  Allergen Reactions   Bee Venom Anaphylaxis   Cat Dander Itching, Swelling and Other (See Comments)    Runny nose, itchy/puffy eyes,     Social History:   Social History   Socioeconomic History   Marital status: Married    Spouse name: Not on file   Number of children: 2   Years of education: Not on file   Highest education level: Not on file  Occupational History   Not on file  Tobacco Use   Smoking status: Never   Smokeless tobacco: Current    Types: Chew  Vaping Use   Vaping status: Never Used  Substance and Sexual Activity   Alcohol use: Yes    Comment: 3 drinks a week.    Drug use: No   Sexual activity: Yes  Other Topics Concern   Not on file  Social History Narrative   Not on file   Social Drivers of Health   Financial Resource Strain: Not on file  Food Insecurity: No Food Insecurity (11/15/2023)   Hunger Vital Sign    Worried About Running Out of Food in the Last Year: Never true    Ran Out of Food in the Last Year: Never true  Transportation Needs: No Transportation Needs (11/15/2023)   PRAPARE - Administrator, Civil Service (Medical): No    Lack of Transportation (Non-Medical): No  Physical  Activity: Not on file   Stress: Not on file  Social Connections: Unknown (10/24/2022)   Received from [facility], [facility]   Social Network    Social Network: Not on file  Intimate Partner Violence: Not At Risk (11/15/2023)   Humiliation, Afraid, Rape, and Kick questionnaire    Fear of Current or Ex-Partner: No    Emotionally Abused: No    Physically Abused: No    Sexually Abused: No    Family History:    Family History  Problem Relation Age of Onset   CAD Other    Diabetes Mellitus II Other    Hypertension Other    Heart failure Other    Hypertension Mother    Heart attack Father        42, 52 at both ages   Diabetes Mellitus II Father        insulin dependent   Aneurysm Maternal Uncle        aortic   Heart attack Paternal Uncle 28       minor   Hypertension Paternal Uncle    Heart attack Maternal Grandmother 82       massive   Aneurysm Maternal Grandfather        abdominal   Heart attack Paternal Grandfather 43   Aneurysm Cousin 44       aortic   Colon polyps Neg Hx    Esophageal cancer Neg Hx    Rectal cancer Neg Hx    Stomach cancer Neg Hx      ROS:  Please see the history of present illness.   All other ROS reviewed and negative.     Physical Exam/Data:   Vitals:   11/17/23 1944 11/18/23 0447 11/18/23 0708 11/18/23 1200  BP:  127/81    Pulse:  65    Resp:  20    Temp:  (!) 97.5 F (36.4 C)    TempSrc:  Oral    SpO2: 94% 99% 95% 95%    Intake/Output Summary (Last 24 hours) at 11/18/2023 1321 Last data filed at 11/17/2023 1700 Gross per 24 hour  Intake 360 ml  Output --  Net 360 ml      11/11/2023    1:51 PM 08/29/2023   11:32 AM 07/18/2023    9:34 AM  Last 3 Weights  Weight (lbs) 244 lb 1.6 oz 249 lb 12.8 oz 239 lb  Weight (kg) 110.723 kg 113.309 kg 108.41 kg     There is no height or weight on file to calculate BMI.  General:  Well nourished, well developed, in no acute distress HEENT: normal Neck: no JVD Vascular: No carotid bruits; Distal pulses 2+  bilaterally Cardiac:  normal S1, S2; RRR; no murmur  Lungs: On O2 nasal cannula, clear to auscultation bilaterally, no wheezing, rhonchi or rales  Abd: soft, nontender, no hepatomegaly  Ext: no edema Musculoskeletal:  No deformities, BUE and BLE strength normal and equal Skin: warm and dry  Neuro:  CNs 2-12 intact, no focal abnormalities noted Psych:  Normal affect   EKG:  The EKG was personally reviewed and demonstrates: Sinus rhythm, left axis deviation  Telemetry:  Telemetry was personally reviewed and demonstrates: Sinus rhythm  Relevant CV Studies: TTE 11/15/2023  1. Aortic valve not well visualized but possibly bicuspid; severe AS  (mean gradient 41 mmHg; AVA 0.56 cm2; DI 0.18); AS worse compared to  11/15/23.   2. Left ventricular ejection fraction, by estimation, is 60 to 65%. The  left  ventricle has normal function. The left ventricle has no regional  wall motion abnormalities. The left ventricular internal cavity size was  mildly dilated. Left ventricular  diastolic parameters are consistent with Grade I diastolic dysfunction  (impaired relaxation).   3. Right ventricular systolic function is normal. The right ventricular  size is normal.   4. The mitral valve is normal in structure. No evidence of mitral valve  regurgitation. No evidence of mitral stenosis.   5. The aortic valve has an indeterminant number of cusps. Aortic valve  regurgitation is not visualized. Severe aortic valve stenosis.   6. Aortic dilatation noted. There is mild dilatation of the aortic root,  measuring 40 mm.   7. The inferior vena cava is normal in size with greater than 50%  respiratory variability, suggesting right atrial pressure of 3 mmHg.    Laboratory Data:  High Sensitivity Troponin:   Recent Labs  Lab 11/14/23 1635 11/14/23 1827  TROPONINIHS 8 8     Chemistry Recent Labs  Lab 11/14/23 1635 11/15/23 0033  NA 137 134*  K 4.5 5.0  CL 103 98  CO2 24 21*  GLUCOSE 156* 236*   BUN 15 17  CREATININE 1.36* 1.36*  CALCIUM 9.4 9.1  GFRNONAA >60 >60  ANIONGAP 10 15    Recent Labs  Lab 11/14/23 1635 11/15/23 0033  PROT 7.5 8.1  ALBUMIN 4.4 4.6  AST 40 43*  ALT 25 25  ALKPHOS 52 55  BILITOT 0.6 0.8   Lipids No results for input(s): "CHOL", "TRIG", "HDL", "LABVLDL", "LDLCALC", "CHOLHDL" in the last 168 hours.  Hematology Recent Labs  Lab 11/14/23 1635 11/15/23 0033  WBC 13.8* 10.1  RBC 4.67 4.85  HGB 14.8 15.3  HCT 45.1 47.5  MCV 96.6 97.9  MCH 31.7 31.5  MCHC 32.8 32.2  RDW 12.4 12.3  PLT 256 265   Thyroid No results for input(s): "TSH", "FREET4" in the last 168 hours.  BNP Recent Labs  Lab 11/14/23 1635  BNP 30.6    DDimer  Recent Labs  Lab 11/14/23 1713  DDIMER <0.27     Radiology/Studies:  ECHOCARDIOGRAM COMPLETE Result Date: 11/15/2023    ECHOCARDIOGRAM REPORT   Patient Name:   [name] Date of Exam: 11/15/2023 Medical Rec #:  [id number]         Height:       70.0 in Accession #:    [id number]        Weight:       244.1 lb Date of Birth:  1965-11-01         BSA:          2.272 m Patient Age:    57 years          BP:           110/74 mmHg Patient Gender: M                 HR:           80 bpm. Exam Location:  Inpatient Procedure: 2D Echo, Cardiac Doppler, Color Doppler and Intracardiac            Opacification Agent (Both Spectral and Color Flow Doppler were            utilized during procedure). Indications:    CHF-Acute Diastolic I50.31  History:        Patient has prior history of Echocardiogram examinations, most  recent 02/15/2023. CHF; Risk Factors:Hypertension and Diabetes.  Sonographer:    Webb Laws Referring Phys: Patrice.Shin MOHAMMAD L GARBA IMPRESSIONS  1. Aortic valve not well visualized but possibly bicuspid; severe AS (mean gradient 41 mmHg; AVA 0.56 cm2; DI 0.18); AS worse compared to 11/15/23.  2. Left ventricular ejection fraction, by estimation, is 60 to 65%. The left ventricle has normal function. The  left ventricle has no regional wall motion abnormalities. The left ventricular internal cavity size was mildly dilated. Left ventricular diastolic parameters are consistent with Grade I diastolic dysfunction (impaired relaxation).  3. Right ventricular systolic function is normal. The right ventricular size is normal.  4. The mitral valve is normal in structure. No evidence of mitral valve regurgitation. No evidence of mitral stenosis.  5. The aortic valve has an indeterminant number of cusps. Aortic valve regurgitation is not visualized. Severe aortic valve stenosis.  6. Aortic dilatation noted. There is mild dilatation of the aortic root, measuring 40 mm.  7. The inferior vena cava is normal in size with greater than 50% respiratory variability, suggesting right atrial pressure of 3 mmHg. FINDINGS  Left Ventricle: Left ventricular ejection fraction, by estimation, is 60 to 65%. The left ventricle has normal function. The left ventricle has no regional wall motion abnormalities. Definity contrast agent was given IV to delineate the left ventricular  endocardial borders. Strain imaging was not performed. The left ventricular internal cavity size was mildly dilated. There is no left ventricular hypertrophy. Left ventricular diastolic parameters are consistent with Grade I diastolic dysfunction (impaired relaxation). Right Ventricle: The right ventricular size is normal. Right ventricular systolic function is normal. Left Atrium: Left atrial size was normal in size. Right Atrium: Right atrial size was normal in size. Pericardium: There is no evidence of pericardial effusion. Mitral Valve: The mitral valve is normal in structure. Mild mitral annular calcification. No evidence of mitral valve regurgitation. No evidence of mitral valve stenosis. Tricuspid Valve: The tricuspid valve is normal in structure. Tricuspid valve regurgitation is not demonstrated. No evidence of tricuspid stenosis. Aortic Valve: The aortic valve  has an indeterminant number of cusps. Aortic valve regurgitation is not visualized. Severe aortic stenosis is present. Aortic valve mean gradient measures 41.4 mmHg. Aortic valve peak gradient measures 67.7 mmHg. Aortic valve area, by VTI measures 0.56 cm. Pulmonic Valve: The pulmonic valve was not well visualized. Pulmonic valve regurgitation is not visualized. No evidence of pulmonic stenosis. Aorta: Aortic dilatation noted. There is mild dilatation of the aortic root, measuring 40 mm. Venous: The inferior vena cava is normal in size with greater than 50% respiratory variability, suggesting right atrial pressure of 3 mmHg. IAS/Shunts: No atrial level shunt detected by color flow Doppler. Additional Comments: Aortic valve not well visualized but possibly bicuspid; severe AS (mean gradient 41 mmHg; AVA 0.56 cm2; DI 0.18); AS worse compared to 11/15/23. 3D imaging was not performed.  LEFT VENTRICLE PLAX 2D LVIDd:         5.50 cm      Diastology LVIDs:         3.40 cm      LV e' medial:    5.98 cm/s LV PW:         1.10 cm      LV E/e' medial:  13.0 LV IVS:        0.80 cm      LV e' lateral:   8.49 cm/s LVOT diam:     2.00 cm      LV  E/e' lateral: 9.2 LV SV:         53 LV SV Index:   23 LVOT Area:     3.14 cm  LV Volumes (MOD) LV vol d, MOD A2C: 131.0 ml LV vol d, MOD A4C: 107.0 ml LV vol s, MOD A2C: 64.0 ml LV vol s, MOD A4C: 56.9 ml LV SV MOD A2C:     67.0 ml LV SV MOD A4C:     107.0 ml LV SV MOD BP:      56.6 ml RIGHT VENTRICLE            IVC RV S prime:     9.79 cm/s  IVC diam: 1.70 cm TAPSE (M-mode): 3.5 cm LEFT ATRIUM             Index        RIGHT ATRIUM           Index LA diam:        3.90 cm 1.72 cm/m   RA Area:     10.50 cm LA Vol (A2C):   42.6 ml 18.75 ml/m  RA Volume:   17.70 ml  7.79 ml/m LA Vol (A4C):   40.8 ml 17.96 ml/m LA Biplane Vol: 44.7 ml 19.68 ml/m  AORTIC VALVE AV Area (Vmax):    0.61 cm AV Area (Vmean):   0.57 cm AV Area (VTI):     0.56 cm AV Vmax:           411.40 cm/s AV Vmean:           301.400 cm/s AV VTI:            0.940 m AV Peak Grad:      67.7 mmHg AV Mean Grad:      41.4 mmHg LVOT Vmax:         80.00 cm/s LVOT Vmean:        54.700 cm/s LVOT VTI:          0.169 m LVOT/AV VTI ratio: 0.18  AORTA Ao Root diam: 3.20 cm Ao Asc diam:  4.00 cm MITRAL VALVE MV Area (PHT): 3.48 cm    SHUNTS MV Decel Time: 218 msec    Systemic VTI:  0.17 m MV E velocity: 78.00 cm/s  Systemic Diam: 2.00 cm MV A velocity: 83.60 cm/s MV E/A ratio:  0.93 Olga Millers MD Electronically signed by Olga Millers MD Signature Date/Time: 11/15/2023/2:08:30 PM    Final    DG Chest Port 1 View Result Date: 11/14/2023 CLINICAL DATA:  Two-week history of cough and shortness of breath EXAM: PORTABLE CHEST 1 VIEW COMPARISON:  Chest radiograph dated 06/28/2023 FINDINGS: Normal lung volumes. No focal consolidations. No pleural effusion or pneumothorax. The heart size and mediastinal contours are within normal limits. No acute osseous abnormality. IMPRESSION: No acute disease. Electronically Signed   By: Agustin Cree M.D.   On: 11/14/2023 16:42     Assessment and Plan:   Stable severe AS Hypoxemia -AS does not typically contribute to hypoxia.  Typical symptoms include lightheadedness and syncope if progressing.  Should not be contributing to his O2 requirement.  However with no underlying lung pathology will plan for a right heart cath to assess his hemodynamics. -N.p.o. at midnight for right heart cath  Informed Consent   Shared Decision Making/Informed Consent The risks [stroke (1 in 1000), death (1 in 1000), kidney failure [usually temporary] (1 in 500), bleeding (1 in 200), allergic reaction [possibly serious] (1 in 200)], benefits (diagnostic support  and management of coronary artery disease) and alternatives of a cardiac catheterization were discussed in detail with Mr. Domzalski and he is willing to proceed.      Elevated CAC-stable No chest pain Continue Lipitor 80 mg daily and Zetia 10 mg  daily  HTN-well-controlled Continue losartan 100 mg daily, amlodipine 10 mg daily     Risk Assessment/Risk Scores:       For questions or updates, please contact [facility] Please consult [email or web address] for contact info under    Signed, Maisie Fus, MD  11/18/2023 1:21 PM

## 2023-11-18 NOTE — Progress Notes (Addendum)
 PROGRESS NOTE    Russell Burns  UJW:[id number] DOB: 22-May-1966 DOA: 11/14/2023 PCP: [name], MD   Brief Narrative:  Russell Burns is a 58 y.o. 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   Assessment & Plan:   Principal Problem:   Acute hypoxemic respiratory failure (HCC) Active Problems:   Type 2 diabetes mellitus (HCC)   Hypertension associated with diabetes (HCC)   Hyperlipidemia associated with type 2 diabetes mellitus (HCC)   Chronic diastolic CHF (congestive heart failure) (HCC)   Malignant neoplasm of left kidney (HCC)   GERD (gastroesophageal reflux disease)   Asthmatic bronchitis , chronic (HCC)   Hypothyroidism   AKI (acute kidney injury) (HCC)  Acute hypoxic respiratory failure Likely acute asthmatic bronchitis versus COPD exacerbation Cannot rule out concurrent pneumonia (CAP), POA  -Chest x-ray shows questionable bilateral edema -Given timing concerning for previous respiratory infection with progressive respiratory distress over the past 2 weeks -Viral panel negative -Completed antibiotic course for presumed pneumonia -Patient has known previous workspace inhalant reactivity, denies any recent exposures at work but this remains most likely etiology for his acute onset respiratory distress -Continue high-dose steroid taper -Continue home inhaler/nebs -Compliant with asthma inhalers, most recent flare 5 months ago, usually well-controlled -Pulmonology consulted, appreciate insight recommendations given patient's ongoing transient episodes of hypoxia  Severe Aortic stenosis, progressive, POA -Prior  echo notable for mod-severe - now classified as severe -Appreciate cardiology insight recommendations, patient continues to have no symptoms with exertion but continues to have episodes of transient hypoxia and respiratory distress.  Non-insulin-dependent type 2 diabetes  Continue sliding scale, hypoglycemic protocol; monitor closely the setting of steroids   Essential hypertension: Continue home amlodipine, losartan Hyperlipidemia: Continue atorvastatin, ezetimibe GERD: Continue PPI Chronic kidney disease stage IIIa: Status post radical nephrectomy. Chronic diastolic CHF: Appears compensated.  Diastolic in nature. Unspecified gammopathy -followed outpatient  DVT prophylaxis: enoxaparin (LOVENOX) injection 40 mg Start: 11/15/23 1000 Code Status:   Code Status: Full Code Family Communication: Wife at bedside  Status is: Inpt  Dispo: The patient is from: Home              Anticipated d/c is to: Home              Anticipated d/c date is: 24 hours              Patient currently NOT medically stable for discharge  Consultants:  None  Procedures:  None  Antimicrobials:  Azithromycin/Ceftriaxone   Subjective: No acute issues or events overnight denies nausea vomiting diarrhea constipation headache fevers chills or chest pain  Objective: Vitals:   11/17/23 1923 11/17/23 1944 11/18/23 0447 11/18/23 0708  BP: 122/78  127/81   Pulse: 79  65   Resp: 20  20   Temp: (!) 97.5 F (36.4 C)  (!) 97.5 F (36.4 C)   TempSrc:   Oral   SpO2: 97% 94% 99% 95%    Intake/Output Summary (Last 24 hours) at 11/18/2023 0735 Last data filed at 11/17/2023 1700 Gross per 24 hour  Intake 1200 ml  Output --  Net 1200 ml   There were no vitals filed for this visit.  Examination:  General:  Pleasantly resting in bed, No acute distress. HEENT:  Normocephalic atraumatic.  Sclerae nonicteric, noninjected.  Extraocular movements intact bilaterally. Neck:  Without mass or deformity.  Trachea is  midline. Lungs:  Clear to auscultate bilaterally without rhonchi, wheeze, or rales. Heart:  Regular rate and rhythm.  Without murmurs, rubs, or gallops. Abdomen:  Soft, nontender, nondistended.  Without guarding or rebound. Extremities: Without cyanosis, clubbing, edema, or obvious deformity. Skin:  Warm and dry, no erythema.  Data Reviewed: I have personally reviewed following labs and imaging studies  CBC: Recent Labs  Lab 11/11/23 1304 11/14/23 1635 11/15/23 0033  WBC 12.8* 13.8* 10.1  NEUTROABS 5.8 6.2  --   HGB 14.7 14.8 15.3  HCT 44.3 45.1 47.5  MCV 93.5 96.6 97.9  PLT 244 256 265   Basic Metabolic Panel: Recent Labs  Lab 11/11/23 1304 11/14/23 1635 11/15/23 0033  NA 137 137 134*  K 4.4 4.5 5.0  CL 101 103 98  CO2 28 24 21*  GLUCOSE 151* 156* 236*  BUN 14 15 17   CREATININE 1.25* 1.36* 1.36*  CALCIUM 9.9 9.4 9.1   GFR: Estimated Creatinine Clearance: 74.7 mL/min (A) (by C-G formula based on SCr of 1.36 mg/dL (H)).  Liver Function Tests: Recent Labs  Lab 11/11/23 1304 11/14/23 1635 11/15/23 0033  AST 31 40 43*  ALT 18 25 25   ALKPHOS 57 52 55  BILITOT 0.3 0.6 0.8  PROT 7.2 7.5 8.1  ALBUMIN 4.8 4.4 4.6   CBG: Recent Labs  Lab 11/16/23 2240 11/17/23 0740 11/17/23 1146 11/17/23 1650 11/17/23 2045  GLUCAP 327* 140* 149* 227* 328*    Recent Results (from the past 240 hours)  Resp panel by RT-PCR (RSV, Flu A&B, Covid) Anterior Nasal Swab     Status: None   Collection Time: 11/14/23  4:35 PM   Specimen: Anterior Nasal Swab  Result Value Ref Range Status   SARS Coronavirus 2 by RT PCR NEGATIVE NEGATIVE Final    Comment: (NOTE) SARS-CoV-2 target nucleic acids are NOT DETECTED.  The SARS-CoV-2 RNA is generally detectable in upper respiratory specimens during the acute phase of infection. The lowest concentration of SARS-CoV-2 viral copies this assay can detect is 138 copies/mL. A negative result does not preclude SARS-Cov-2 infection and should  not be used as the sole basis for treatment or other patient management decisions. A negative result may occur with  improper specimen collection/handling, submission of specimen other than nasopharyngeal swab, presence of viral mutation(s) within the areas targeted by this assay, and inadequate number of viral copies(<138 copies/mL). A negative result must be combined with clinical observations, patient history, and epidemiological information. The expected result is Negative.  Fact Sheet for Patients:  BloggerCourse.com  Fact Sheet for Healthcare Providers:  SeriousBroker.it  This test is no t yet approved or cleared by the [place] FDA and  has been authorized for detection and/or diagnosis of SARS-CoV-2 by FDA under an Emergency Use Authorization (EUA). This EUA will remain  in effect (meaning this test can be used) for the duration of the COVID-19 declaration under Section 564(b)(1) of the Act, 21 U.S.C.section 360bbb-3(b)(1), unless the authorization is terminated  or revoked sooner.       Influenza A by PCR NEGATIVE NEGATIVE Final   Influenza B by PCR NEGATIVE NEGATIVE Final    Comment: (NOTE) The Xpert Xpress SARS-CoV-2/FLU/RSV plus assay is intended as an aid in the diagnosis of influenza from Nasopharyngeal swab specimens and should not be used as  a sole basis for treatment. Nasal washings and aspirates are unacceptable for Xpert Xpress SARS-CoV-2/FLU/RSV testing.  Fact Sheet for Patients: BloggerCourse.com  Fact Sheet for Healthcare Providers: SeriousBroker.it  This test is not yet approved or cleared by the [place] FDA and has been authorized for detection and/or diagnosis of SARS-CoV-2 by FDA under an Emergency Use Authorization (EUA). This EUA will remain in effect (meaning this test can be used) for the duration of the COVID-19 declaration under  Section 564(b)(1) of the Act, 21 U.S.C. section 360bbb-3(b)(1), unless the authorization is terminated or revoked.     Resp Syncytial Virus by PCR NEGATIVE NEGATIVE Final    Comment: (NOTE) Fact Sheet for Patients: BloggerCourse.com  Fact Sheet for Healthcare Providers: SeriousBroker.it  This test is not yet approved or cleared by the [place] FDA and has been authorized for detection and/or diagnosis of SARS-CoV-2 by FDA under an Emergency Use Authorization (EUA). This EUA will remain in effect (meaning this test can be used) for the duration of the COVID-19 declaration under Section 564(b)(1) of the Act, 21 U.S.C. section 360bbb-3(b)(1), unless the authorization is terminated or revoked.  Performed at [facility], [address] [place], [place] [address]     Radiology Studies: No results found.  Scheduled Meds:  amLODipine  10 mg Oral Daily   atorvastatin  80 mg Oral Daily   azithromycin  500 mg Oral Daily   empagliflozin  25 mg Oral Daily   enoxaparin (LOVENOX) injection  40 mg Subcutaneous Q24H   ezetimibe  10 mg Oral Daily   famotidine  20 mg Oral QPM   fluticasone  2 spray Each Nare Daily   insulin aspart  0-20 Units Subcutaneous TID WC   insulin aspart  0-5 Units Subcutaneous QHS   ipratropium-albuterol  3 mL Nebulization Q4H   losartan  100 mg Oral Daily   predniSONE  40 mg Oral Q breakfast   tamsulosin  0.4 mg Oral Daily   Continuous Infusions:  cefTRIAXone (ROCEPHIN)  IV 1 g (11/17/23 2254)    LOS: 4 days   Time spent:  Azucena Fallen, DO [facility]  If 7PM-7AM, please contact night-coverage [email or web address]  11/18/2023, 7:35 AM

## 2023-11-18 NOTE — Plan of Care (Signed)

## 2023-11-18 NOTE — Inpatient Diabetes Management (Signed)
 Inpatient Diabetes Program Recommendations  AACE/ADA: New Consensus Statement on Inpatient Glycemic Control (2015)  Target Ranges:  Prepandial:   less than 140 mg/dL      Peak postprandial:   less than 180 mg/dL (1-2 hours)      Critically ill patients:  140 - 180 mg/dL    Latest Reference Range & Units 11/17/23 07:40 11/17/23 11:46 11/17/23 16:50 11/17/23 20:45  Glucose-Capillary 70 - 99 mg/dL 161 (H)  3 units Novolog  149 (H)  3 units Novolog  227 (H)  7 units Novolog  328 (H)  4 units Novolog   (H): Data is abnormally high  Latest Reference Range & Units 11/18/23 07:43  Glucose-Capillary 70 - 99 mg/dL 096 (H)  (H): Data is abnormally high    Home DM Meds: Jardiance 25 mg daily      Metformin 1000 mg bid   Current Orders: Novolog Resistant Correction Scale/ SSI (0-20 units) TID AC + HS      Jardiance 25 mg daily     MD- Note pt getting Prednisone 40 mg daily  Late afternoon CBGs are elevated  May consider adding Novolog Meal Coverage at Lunch and Dinner Only: Novolog 4 units BID with Lunch and Dinner only    --Will follow patient during hospitalization--  Ambrose Finland RN, MSN, CDCES Diabetes Coordinator Inpatient Glycemic Control Team Team Pager: [phone] (8a-5p)

## 2023-11-19 ENCOUNTER — Encounter (HOSPITAL_COMMUNITY)
Admission: EM | Disposition: A | Discharge status: Home / Self Care | Payer: Self-pay | Source: Home / Self Care | Attending: Internal Medicine

## 2023-11-19 DIAGNOSIS — J4541 Moderate persistent asthma with (acute) exacerbation: Secondary | ICD-10-CM | POA: Diagnosis not present

## 2023-11-19 DIAGNOSIS — I350 Nonrheumatic aortic (valve) stenosis: Secondary | ICD-10-CM

## 2023-11-19 DIAGNOSIS — J9601 Acute respiratory failure with hypoxia: Secondary | ICD-10-CM | POA: Diagnosis not present

## 2023-11-19 HISTORY — PX: RIGHT HEART CATH AND CORONARY ANGIOGRAPHY: CATH118264

## 2023-11-19 LAB — GLUCOSE, CAPILLARY
Glucose-Capillary: 125 mg/dL — ABNORMAL HIGH (ref 70–99)
Glucose-Capillary: 149 mg/dL — ABNORMAL HIGH (ref 70–99)
Glucose-Capillary: 157 mg/dL — ABNORMAL HIGH (ref 70–99)
Glucose-Capillary: 183 mg/dL — ABNORMAL HIGH (ref 70–99)
Glucose-Capillary: 493 mg/dL — ABNORMAL HIGH (ref 70–99)

## 2023-11-19 LAB — BASIC METABOLIC PANEL
Anion gap: 10 (ref 5–15)
BUN: 23 mg/dL — ABNORMAL HIGH (ref 6–20)
CO2: 28 mmol/L (ref 22–32)
Calcium: 9.4 mg/dL (ref 8.9–10.3)
Chloride: 103 mmol/L (ref 98–111)
Creatinine, Ser: 1.14 mg/dL (ref 0.61–1.24)
GFR, Estimated: >60 mL/min (ref >60)
Glucose, Bld: 139 mg/dL — ABNORMAL HIGH (ref 70–99)
Potassium: 4.3 mmol/L (ref 3.5–5.1)
Sodium: 141 mmol/L (ref 135–145)

## 2023-11-19 LAB — POCT I-STAT 7, (LYTES, BLD GAS, ICA,H+H)
Acid-base deficit: 1 mmol/L (ref 0.0–2.0)
Acid-base deficit: 1 mmol/L (ref 0.0–2.0)
Bicarbonate: 25.5 mmol/L (ref 20.0–28.0)
Bicarbonate: 25.8 mmol/L (ref 20.0–28.0)
Calcium, Ion: 1.15 mmol/L (ref 1.15–1.40)
Calcium, Ion: 1.21 mmol/L (ref 1.15–1.40)
HCT: 45 % (ref 39.0–52.0)
HCT: 46 % (ref 39.0–52.0)
Hemoglobin: 15.3 g/dL (ref 13.0–17.0)
Hemoglobin: 15.6 g/dL (ref 13.0–17.0)
O2 Saturation: 61 %
O2 Saturation: 61 %
Potassium: 4.5 mmol/L (ref 3.5–5.1)
Potassium: 4.7 mmol/L (ref 3.5–5.1)
Sodium: 139 mmol/L (ref 135–145)
Sodium: 139 mmol/L (ref 135–145)
TCO2: 27 mmol/L (ref 22–32)
TCO2: 27 mmol/L (ref 22–32)
pCO2 arterial: 45.8 mmHg (ref 32–48)
pCO2 arterial: 47.3 mmHg (ref 32–48)
pH, Arterial: 7.344 — ABNORMAL LOW (ref 7.35–7.45)
pH, Arterial: 7.354 (ref 7.35–7.45)
pO2, Arterial: 33 mmHg — CL (ref 83–108)
pO2, Arterial: 34 mmHg — CL (ref 83–108)

## 2023-11-19 SURGERY — RIGHT HEART CATH AND CORONARY ANGIOGRAPHY
Anesthesia: LOCAL

## 2023-11-19 MED ORDER — HEPARIN (PORCINE) IN NACL 1000-0.9 UT/500ML-% IV SOLN
INTRAVENOUS | Status: DC | PRN
  Administered 2023-11-19 (×2): 500 mL

## 2023-11-19 MED ORDER — FENTANYL CITRATE (PF) 100 MCG/2ML IJ SOLN
INTRAMUSCULAR | Status: DC | PRN
  Administered 2023-11-19: 25 ug via INTRAVENOUS

## 2023-11-19 MED ORDER — SODIUM CHLORIDE 0.9 % IV SOLN: INTRAVENOUS | Status: DC

## 2023-11-19 MED ORDER — FENTANYL CITRATE (PF) 100 MCG/2ML IJ SOLN
INTRAMUSCULAR | Status: AC
  Filled 2023-11-19: qty 2

## 2023-11-19 MED ORDER — MIDAZOLAM HCL 2 MG/2ML IJ SOLN
INTRAMUSCULAR | Status: AC
  Filled 2023-11-19: qty 2

## 2023-11-19 MED ORDER — HEPARIN SODIUM (PORCINE) 1000 UNIT/ML IJ SOLN
INTRAMUSCULAR | Status: DC | PRN
  Administered 2023-11-19: 5000 [IU] via INTRAVENOUS

## 2023-11-19 MED ORDER — MIDAZOLAM HCL 2 MG/2ML IJ SOLN
INTRAMUSCULAR | Status: DC | PRN
  Administered 2023-11-19: 1 mg via INTRAVENOUS

## 2023-11-19 MED ORDER — IOHEXOL 350 MG/ML SOLN
INTRAVENOUS | Status: DC | PRN
  Administered 2023-11-19: 35 mL

## 2023-11-19 MED ORDER — VERAPAMIL HCL 2.5 MG/ML IV SOLN
INTRAVENOUS | Status: DC | PRN
  Administered 2023-11-19: 10 mL via INTRA_ARTERIAL

## 2023-11-19 MED ORDER — HEPARIN SODIUM (PORCINE) 1000 UNIT/ML IJ SOLN
INTRAMUSCULAR | Status: AC
  Filled 2023-11-19: qty 10

## 2023-11-19 MED ORDER — INSULIN GLARGINE 100 UNIT/ML ~~LOC~~ SOLN
10.0000 [IU] | Freq: Every day | SUBCUTANEOUS | Status: DC
  Administered 2023-11-19: 10 [IU] via SUBCUTANEOUS
  Filled 2023-11-19 (×2): qty 0.1

## 2023-11-19 MED ORDER — SODIUM CHLORIDE 0.9 % WEIGHT BASED INFUSION: 1.0000 mL/kg/h | INTRAVENOUS | Status: DC

## 2023-11-19 MED ORDER — SODIUM CHLORIDE 0.9 % WEIGHT BASED INFUSION
3.0000 mL/kg/h | INTRAVENOUS | Status: AC
  Administered 2023-11-19: 3 mL/kg/h via INTRAVENOUS

## 2023-11-19 MED ORDER — ASPIRIN 81 MG PO CHEW
CHEWABLE_TABLET | ORAL | Status: AC
Start: 2023-11-19 — End: 2023-11-20
  Filled 2023-11-19: qty 1

## 2023-11-19 MED ORDER — INSULIN ASPART 100 UNIT/ML IJ SOLN
20.0000 [IU] | Freq: Once | INTRAMUSCULAR | Status: AC
  Administered 2023-11-19: 20 [IU] via SUBCUTANEOUS

## 2023-11-19 MED ORDER — LIDOCAINE HCL (PF) 1 % IJ SOLN
INTRAMUSCULAR | Status: DC | PRN
  Administered 2023-11-19 (×2): 5 mL via INTRADERMAL

## 2023-11-19 MED ORDER — LIDOCAINE HCL (PF) 1 % IJ SOLN
INTRAMUSCULAR | Status: AC
  Filled 2023-11-19: qty 30

## 2023-11-19 MED ORDER — ASPIRIN 81 MG PO CHEW
81.0000 mg | CHEWABLE_TABLET | ORAL | Status: AC
Start: 2023-11-19 — End: 2023-11-19
  Administered 2023-11-19: 81 mg via ORAL

## 2023-11-19 MED ORDER — VERAPAMIL HCL 2.5 MG/ML IV SOLN
INTRAVENOUS | Status: AC
  Filled 2023-11-19: qty 2

## 2023-11-19 SURGICAL SUPPLY — 13 items
CATH 5FR JL3.5 JR4 ANG PIG MP (CATHETERS) IMPLANT
CATH BALLN WEDGE 5F 110CM (CATHETERS) IMPLANT
CATH INFINITI 5FR JL4 (CATHETERS) IMPLANT
DEVICE RAD COMP TR BAND LRG (VASCULAR PRODUCTS) IMPLANT
GLIDESHEATH SLEND SS 6F .021 (SHEATH) IMPLANT
GUIDEWIRE INQWIRE 1.5J.035X260 (WIRE) IMPLANT
INQWIRE 1.5J .035X260CM (WIRE) ×1 IMPLANT
KIT SINGLE USE MANIFOLD (KITS) IMPLANT
KIT SYRINGE INJ CVI SPIKEX1 (MISCELLANEOUS) IMPLANT
PACK CARDIAC CATHETERIZATION (CUSTOM PROCEDURE TRAY) ×1 IMPLANT
SET ATX-X65L (MISCELLANEOUS) IMPLANT
SHEATH GLIDE SLENDER 4/5FR (SHEATH) IMPLANT
SHEATH PROBE COVER 6X72 (BAG) IMPLANT

## 2023-11-19 NOTE — Inpatient Diabetes Management (Signed)
 Inpatient Diabetes Program Recommendations  AACE/ADA: New Consensus Statement on Inpatient Glycemic Control (2015)  Target Ranges:  Prepandial:   less than 140 mg/dL      Peak postprandial:   less than 180 mg/dL (1-2 hours)      Critically ill patients:  140 - 180 mg/dL    Latest Reference Range & Units 11/17/23 07:40 11/17/23 11:46 11/17/23 16:50 11/17/23 20:45  Glucose-Capillary 70 - 99 mg/dL 161 (H) 096 (H) 045 (H) 328 (H)  (H): Data is abnormally high  Latest Reference Range & Units 11/18/23 07:43 11/18/23 11:50 11/18/23 16:30 11/18/23 20:37  Glucose-Capillary 70 - 99 mg/dL 409 (H) 811 (H) 914 (H) 265 (H)  (H): Data is abnormally high   Home DM Meds: Jardiance 25 mg daily      Metformin 1000 mg bid    Current Orders: Novolog Resistant Correction Scale/ SSI (0-20 units) TID AC + HS                            Jardiance 25 mg daily         MD- Note pt getting Prednisone 40 mg daily   Late afternoon CBGs are elevated   May consider adding Novolog Meal Coverage at [employer] and Dinner Only: Novolog 4 units BID with Lunch and Dinner only      --Will follow patient during hospitalization--  Ambrose Finland RN, MSN, CDCES Diabetes Coordinator Inpatient Glycemic Control Team Team Pager: [phone] (8a-5p)  --Will follow patient during hospitalization--   Ambrose Finland RN, MSN, CDCES Diabetes Coordinator Inpatient Glycemic Control Team Team Pager: [phone] (8a-5p)

## 2023-11-19 NOTE — Plan of Care (Signed)
  Problem: Education: Goal: Ability to describe self-care measures that may prevent or decrease complications (Diabetes Survival Skills Education) will improve Outcome: Progressing Goal: Individualized Educational Video(s) Outcome: Progressing   Problem: Coping: Goal: Ability to adjust to condition or change in health will improve Outcome: Progressing   Problem: Fluid Volume: Goal: Ability to maintain a balanced intake and output will improve Outcome: Progressing   Problem: Health Behavior/Discharge Planning: Goal: Ability to identify and utilize available resources and services will improve Outcome: Progressing Goal: Ability to manage health-related needs will improve Outcome: Progressing   Problem: Metabolic: Goal: Ability to maintain appropriate glucose levels will improve Outcome: Progressing   Problem: Nutritional: Goal: Maintenance of adequate nutrition will improve Outcome: Progressing Goal: Progress toward achieving an optimal weight will improve Outcome: Progressing   Problem: Skin Integrity: Goal: Risk for impaired skin integrity will decrease Outcome: Progressing   Problem: Tissue Perfusion: Goal: Adequacy of tissue perfusion will improve Outcome: Progressing   Problem: Education: Goal: Knowledge of General Education information will improve Description: Including pain rating scale, medication(s)/side effects and non-pharmacologic comfort measures Outcome: Progressing   Problem: Health Behavior/Discharge Planning: Goal: Ability to manage health-related needs will improve Outcome: Progressing   Problem: Clinical Measurements: Goal: Ability to maintain clinical measurements within normal limits will improve Outcome: Progressing Goal: Will remain free from infection Outcome: Progressing Goal: Diagnostic test results will improve Outcome: Progressing Goal: Respiratory complications will improve Outcome: Progressing Goal: Cardiovascular complication will  be avoided Outcome: Progressing   Problem: Activity: Goal: Risk for activity intolerance will decrease Outcome: Progressing   Problem: Nutrition: Goal: Adequate nutrition will be maintained Outcome: Progressing   Problem: Coping: Goal: Level of anxiety will decrease Outcome: Progressing   Problem: Elimination: Goal: Will not experience complications related to bowel motility Outcome: Progressing Goal: Will not experience complications related to urinary retention Outcome: Progressing   Problem: Pain Managment: Goal: General experience of comfort will improve and/or be controlled Outcome: Progressing   Problem: Safety: Goal: Ability to remain free from injury will improve Outcome: Progressing   Problem: Skin Integrity: Goal: Risk for impaired skin integrity will decrease Outcome: Progressing   Problem: Education: Goal: Knowledge of disease or condition will improve Outcome: Progressing Goal: Knowledge of the prescribed therapeutic regimen will improve Outcome: Progressing Goal: Individualized Educational Video(s) Outcome: Progressing   Problem: Activity: Goal: Ability to tolerate increased activity will improve Outcome: Progressing Goal: Will verbalize the importance of balancing activity with adequate rest periods Outcome: Progressing   Problem: Respiratory: Goal: Ability to maintain a clear airway will improve Outcome: Progressing Goal: Levels of oxygenation will improve Outcome: Progressing Goal: Ability to maintain adequate ventilation will improve Outcome: Progressing   Problem: Education: Goal: Understanding of CV disease, CV risk reduction, and recovery process will improve Outcome: Progressing Goal: Individualized Educational Video(s) Outcome: Progressing   Problem: Activity: Goal: Ability to return to baseline activity level will improve Outcome: Progressing   Problem: Cardiovascular: Goal: Ability to achieve and maintain adequate  cardiovascular perfusion will improve Outcome: Progressing Goal: Vascular access site(s) Level 0-1 will be maintained Outcome: Progressing   Problem: Health Behavior/Discharge Planning: Goal: Ability to safely manage health-related needs after discharge will improve Outcome: Progressing

## 2023-11-19 NOTE — Progress Notes (Signed)
 PROGRESS NOTE    Russell Burns  ZOX:[id number] DOB: [birth year] DOA: 11/14/2023 PCP: [name], MD   Brief Narrative:  Russell Burns is a 58 y.o. 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   Assessment & Plan:   Principal Problem:   Acute hypoxemic respiratory failure (HCC) Active Problems:   Type 2 diabetes mellitus (HCC)   Hypertension associated with diabetes (HCC)   Hyperlipidemia associated with type 2 diabetes mellitus (HCC)   Chronic diastolic CHF (congestive heart failure) (HCC)   Malignant neoplasm of left kidney (HCC)   GERD (gastroesophageal reflux disease)   Asthmatic bronchitis , chronic (HCC)   Hypothyroidism   AKI (acute kidney injury) (HCC)   Acute hypoxic respiratory failure Likely acute asthmatic bronchitis versus COPD exacerbation Cannot rule out concurrent pneumonia (CAP), POA  -Chest x-ray shows questionable bilateral edema -Given timing concerning for previous respiratory infection with progressive respiratory distress over the past 2 weeks -Viral panel negative -Completed antibiotic course for presumed pneumonia -Patient has known previous workspace inhalant reactivity, denies any recent exposures at work but this remains most likely etiology for his acute onset respiratory distress -Continue high-dose steroid taper -Continue home inhaler/nebs -Compliant with asthma inhalers, most recent flare 5 months ago, usually well-controlled -Pulmonology consulted, appreciate insight recommendations given patient's ongoing transient episodes of hypoxia  Severe Aortic stenosis, progressive, POA -Prior  echo notable for mod-severe - now classified as severe -Appreciate cardiology insight recommendations, patient continues to have no symptoms with exertion but continues to have episodes of transient hypoxia and respiratory distress even at rest -Plan for right left heart cath today 11/19/2023 with transition to [facility] to return to [facility] after  Non-insulin-dependent type 2 diabetes  Continue sliding scale, hypoglycemic protocol; monitor closely the setting of steroids   Essential hypertension: Continue home amlodipine, losartan Hyperlipidemia: Continue atorvastatin, ezetimibe GERD: Continue PPI Chronic kidney disease stage IIIa: Status post radical nephrectomy. Chronic diastolic CHF: Appears compensated.  Diastolic in nature. Unspecified gammopathy -followed outpatient  DVT prophylaxis: enoxaparin (LOVENOX) injection 40 mg Start: 11/15/23 1000 Code Status:   Code Status: Full Code Family Communication: Wife at bedside  Status is: Inpt  Dispo: The patient is from: Home              Anticipated d/c is to: Home              Anticipated d/c date is: 24 hours              Patient currently NOT medically stable for discharge  Consultants:  None  Procedures:  None  Antimicrobials:  Azithromycin/Ceftriaxone   Subjective: No acute issues or events overnight denies nausea vomiting diarrhea constipation headache fevers chills or chest pain  Objective: Vitals:   11/18/23 1923 11/18/23 2126 11/19/23 0146 11/19/23 0437  BP: (!) 138/90   100/88  Pulse: 80   79  Resp: 20   18  Temp: 98.1 F (36.7 C)   (!) 97.3 F (36.3 C)  TempSrc: Oral   Oral  SpO2: 98% 98% 97% 93%   No intake or output data in the 24 hours ending 11/19/23 0742  There were no vitals filed  for this visit.  Examination:  General:  Pleasantly resting in bed, No acute distress. HEENT:  Normocephalic atraumatic.  Sclerae nonicteric, noninjected.  Extraocular movements intact bilaterally. Neck:   Without mass or deformity.  Trachea is midline. Lungs:  Clear to auscultate bilaterally without rhonchi, wheeze, or rales. Heart:  Regular rate and rhythm.  Without murmurs, rubs, or gallops. Abdomen:  Soft, nontender, nondistended.  Without guarding or rebound. Extremities: Without cyanosis, clubbing, edema, or obvious deformity. Skin:  Warm and dry, no erythema.  Data Reviewed: I have personally reviewed following labs and imaging studies  CBC: Recent Labs  Lab 11/14/23 1635 11/15/23 0033  WBC 13.8* 10.1  NEUTROABS 6.2  --   HGB 14.8 15.3  HCT 45.1 47.5  MCV 96.6 97.9  PLT 256 265   Basic Metabolic Panel: Recent Labs  Lab 11/14/23 1635 11/15/23 0033  NA 137 134*  K 4.5 5.0  CL 103 98  CO2 24 21*  GLUCOSE 156* 236*  BUN 15 17  CREATININE 1.36* 1.36*  CALCIUM 9.4 9.1   GFR: Estimated Creatinine Clearance: 74.7 mL/min (A) (by C-G formula based on SCr of 1.36 mg/dL (H)).  Liver Function Tests: Recent Labs  Lab 11/14/23 1635 11/15/23 0033  AST 40 43*  ALT 25 25  ALKPHOS 52 55  BILITOT 0.6 0.8  PROT 7.5 8.1  ALBUMIN 4.4 4.6   CBG: Recent Labs  Lab 11/17/23 2045 11/18/23 0743 11/18/23 1150 11/18/23 1630 11/18/23 2037  GLUCAP 328* 110* 191* 302* 265*    Recent Results (from the past 240 hours)  Resp panel by RT-PCR (RSV, Flu A&B, Covid) Anterior Nasal Swab     Status: None   Collection Time: 11/14/23  4:35 PM   Specimen: Anterior Nasal Swab  Result Value Ref Range Status   SARS Coronavirus 2 by RT PCR NEGATIVE NEGATIVE Final    Comment: (NOTE) SARS-CoV-2 target nucleic acids are NOT DETECTED.  The SARS-CoV-2 RNA is generally detectable in upper respiratory specimens during the acute phase of infection. The lowest concentration of SARS-CoV-2 viral copies this assay can detect is 138 copies/mL. A negative result does not preclude SARS-Cov-2 infection and should not be used as the sole basis for treatment or other patient management decisions. A  negative result may occur with  improper specimen collection/handling, submission of specimen other than nasopharyngeal swab, presence of viral mutation(s) within the areas targeted by this assay, and inadequate number of viral copies(<138 copies/mL). A negative result must be combined with clinical observations, patient history, and epidemiological information. The expected result is Negative.  Fact Sheet for Patients:  BloggerCourse.com  Fact Sheet for Healthcare Providers:  SeriousBroker.it  This test is no t yet approved or cleared by the [place] FDA and  has been authorized for detection and/or diagnosis of SARS-CoV-2 by FDA under an Emergency Use Authorization (EUA). This EUA will remain  in effect (meaning this test can be used) for the duration of the COVID-19 declaration under Section 564(b)(1) of the Act, 21 U.S.C.section 360bbb-3(b)(1), unless the authorization is terminated  or revoked sooner.       Influenza A by PCR NEGATIVE NEGATIVE Final   Influenza B by PCR NEGATIVE NEGATIVE Final    Comment: (NOTE) The Xpert Xpress SARS-CoV-2/FLU/RSV plus assay is intended as an aid in the diagnosis of influenza from Nasopharyngeal swab specimens and should not be used as a sole basis for treatment. Nasal washings and aspirates are unacceptable for Xpert Xpress SARS-CoV-2/FLU/RSV testing.  Fact Sheet for  Patients: BloggerCourse.com  Fact Sheet for Healthcare Providers: SeriousBroker.it  This test is not yet approved or cleared by the [place] FDA and has been authorized for detection and/or diagnosis of SARS-CoV-2 by FDA under an Emergency Use Authorization (EUA). This EUA will remain in effect (meaning this test can be used) for the duration of the COVID-19 declaration under Section 564(b)(1) of the Act, 21 U.S.C. section 360bbb-3(b)(1), unless the authorization  is terminated or revoked.     Resp Syncytial Virus by PCR NEGATIVE NEGATIVE Final    Comment: (NOTE) Fact Sheet for Patients: BloggerCourse.com  Fact Sheet for Healthcare Providers: SeriousBroker.it  This test is not yet approved or cleared by the [place] FDA and has been authorized for detection and/or diagnosis of SARS-CoV-2 by FDA under an Emergency Use Authorization (EUA). This EUA will remain in effect (meaning this test can be used) for the duration of the COVID-19 declaration under Section 564(b)(1) of the Act, 21 U.S.C. section 360bbb-3(b)(1), unless the authorization is terminated or revoked.  Performed at [facility], [address] [place], [place] [address]     Radiology Studies: No results found.  Scheduled Meds:  amLODipine  10 mg Oral Daily   atorvastatin  80 mg Oral Daily   azithromycin  500 mg Oral Daily   empagliflozin  25 mg Oral Daily   enoxaparin (LOVENOX) injection  40 mg Subcutaneous Q24H   ezetimibe  10 mg Oral Daily   famotidine  20 mg Oral QPM   fluticasone  2 spray Each Nare Daily   umeclidinium bromide  1 puff Inhalation Daily   And   fluticasone furoate-vilanterol  1 puff Inhalation Daily   insulin aspart  0-20 Units Subcutaneous TID WC   insulin aspart  0-5 Units Subcutaneous QHS   ipratropium-albuterol  3 mL Nebulization Q6H   levothyroxine  25 mcg Oral Q0600   losartan  100 mg Oral Daily   predniSONE  40 mg Oral Q breakfast   tamsulosin  0.4 mg Oral Daily   Continuous Infusions:  [START ON 11/20/2023] sodium chloride      LOS: 5 days   Time spent:  Azucena Fallen, DO [facility]  If 7PM-7AM, please contact night-coverage [email or web address]  11/19/2023, 7:42 AM

## 2023-11-19 NOTE — Progress Notes (Signed)
 TR BAND REMOVAL  LOCATION:   R radial   DEFLATED PER PROTOCOL:   yes  TIME BAND OFF / DRESSING APPLIED:  1840  SITE UPON ARRIVAL:    Level 0  SITE AFTER BAND REMOVAL:    Level 0  CIRCULATION SENSATION AND MOVEMENT:    Within Normal Limits   COMMENTS:

## 2023-11-19 NOTE — Plan of Care (Signed)

## 2023-11-19 NOTE — Progress Notes (Signed)
   Patient Name: Russell Burns Date of Encounter: 11/19/2023 [facility] Cardiologist: Donato Schultz, MD   Interval Summary  .    Patient feeling a bit better today. Expresses discomfort 2/2 NPO status prior to [facility].  Vital Signs .    Vitals:   11/18/23 2126 11/19/23 0146 11/19/23 0437 11/19/23 0800  BP:   100/88   Pulse:   79   Resp:   18   Temp:   (!) 97.3 F (36.3 C)   TempSrc:   Oral   SpO2: 98% 97% 93%   Weight:    105.7 kg   No intake or output data in the 24 hours ending 11/19/23 0903    11/19/2023    8:00 AM 11/11/2023    1:51 PM 08/29/2023   11:32 AM  Last 3 Weights  Weight (lbs) 233 lb 244 lb 1.6 oz 249 lb 12.8 oz  Weight (kg) 105.688 kg 110.723 kg 113.309 kg      Telemetry/ECG    Sinus rhythm - Personally Reviewed  Physical Exam .   GEN: No acute distress.   Neck: No JVD Cardiac: RRR, no rubs, or gallops. 3/6 systolic murmur RUSB Respiratory: Clear to auscultation bilaterally. GI: Soft, nontender, non-distended  MS: No edema  Assessment & Plan .     Russell Burns is a 58 y.o. male with a hx of significant for severe aortic stenosis, asthmatic bronchitis, type 2 diabetes, renal cell carcinoma s/p nephrectomy no in remission, and prior history of occupational lung disease who presented to the ER with complaints of shortness of breath and persistent cough.  He is being seen  for the evaluation of SOB with severe AS at the request of Dr. Natale Milch.   Bicuspid Severe Aortic Valve Stenosis Thoracic aortic aneurysm Patient admitted with acute hypoxic respiratory failure (Acute bronchitis vs CAP). Cardiology asked to see patient given severe AS. Echo from this admission shows mean gradient 41.15mmHg, AVA of 0.56 cm2, dimensionless index of 0.18.  Would not expect hypoxic respiratory failure secondary to aortic stenosis. However, given what appears to be bicuspid aortic valve with severe stenosis, will proceed with inpatient right and left heart  catheterization today. Will plan to refer patient back to Dr. Dorris Fetch for outpatient CT surgery evaluation.  Patient without significant traditional aortic stenosis symptoms such as exertional dyspnea, near syncope. Informed Consent   Shared Decision Making/Informed Consent The risks [stroke (1 in 1000), death (1 in 1000), kidney failure [usually temporary] (1 in 500), bleeding (1 in 200), allergic reaction [possibly serious] (1 in 200)], benefits (diagnostic support and management of coronary artery disease) and alternatives of a cardiac catheterization were discussed in detail with Russell Burns and he is willing to proceed.     Acute hypoxic respiratory failure Hx Occupational lung disease Patient with chronic dyspnea x2 years. Currently being treated with empiric ceftriaxone as well as steroids.  Management per primary team and PCCM.   For questions or updates, please contact  [facility] Please consult [email or web address] for contact info under        Signed, Perlie Gold, PA-C

## 2023-11-20 ENCOUNTER — Telehealth: Payer: Self-pay | Admitting: Pulmonary Disease

## 2023-11-20 ENCOUNTER — Encounter (HOSPITAL_COMMUNITY): Payer: Self-pay | Admitting: Cardiology

## 2023-11-20 ENCOUNTER — Encounter: Payer: Self-pay | Admitting: Cardiology

## 2023-11-20 DIAGNOSIS — I5032 Chronic diastolic (congestive) heart failure: Secondary | ICD-10-CM

## 2023-11-20 DIAGNOSIS — J4541 Moderate persistent asthma with (acute) exacerbation: Secondary | ICD-10-CM

## 2023-11-20 DIAGNOSIS — J4489 Other specified chronic obstructive pulmonary disease: Secondary | ICD-10-CM | POA: Diagnosis not present

## 2023-11-20 DIAGNOSIS — J9601 Acute respiratory failure with hypoxia: Secondary | ICD-10-CM | POA: Diagnosis not present

## 2023-11-20 LAB — GLUCOSE, CAPILLARY
Glucose-Capillary: 211 mg/dL — ABNORMAL HIGH (ref 70–99)
Glucose-Capillary: 126 mg/dL — ABNORMAL HIGH (ref 70–99)

## 2023-11-20 MED ORDER — IPRATROPIUM-ALBUTEROL 0.5-2.5 (3) MG/3ML IN SOLN: 3.0000 mL | Freq: Two times a day (BID) | RESPIRATORY_TRACT | Status: DC

## 2023-11-20 NOTE — Progress Notes (Signed)
 NAME:  Russell Burns, MRN:  [id number], DOB:  [birth year], LOS: 6 ADMISSION DATE:  11/14/2023, CONSULTATION DATE:  11/18/2023 REFERRING MD:  Dr Natale Milch - [facility], CHIEF COMPLAINT:  Dyspnea and hypoxia    History of Present Illness:  Russell Burns is a 58 year old male with a past medical history significant for severe aortic stenosis, asthmatic bronchitis, type 2 diabetes, renal cell carcinoma s/p nephrectomy no in remission, and prior history of occupational lung disease who presented to the ER with complaints of shortness of breath and persistent cough.  Patient was admitted per hospitalist service for management of acute hypoxic respiratory failure likely secondary to acute asthmatic bronchitis versus COPD exacerbation.  He received 5 days of CAP coverage and high-dose steroids without complete resolution of dyspnea therefore PCCM consult for further assistance in management.  He reports persistent episodic shortness of breath for the last 2 years. He was being treated with [facility] for renal cell carcinoma and there was some concern for Keytruda pneumonitis but symptoms have continued even after stopping this medication in September 2024.   Pertinent  Medical History  Severe aortic stenosis, asthmatic bronchitis, type 2 diabetes, renal cell carcinoma s/p nephrectomy no in remission, and prior history of occupational lung disease  Significant Hospital Events: Including procedures, antibiotic start and stop dates in addition to other pertinent events   2/20 presented with complaints of shortness of breath and cough 2/21 echocardiogram with poor visualization of aortic valve but valve continues to appear bicuspid with severe aortic stenosis, mean gradient 41 mmHg, worse compared to echo 10/2022 2/24 continues to have intermittent sudden onset episodes of chest discomfort and hypoxia resulting in PCCM consult. 2/25 LHC/RHC normal PCWP, moderately reduced cardiac output, normal  coronaries  Interim History / Subjective:  Sitting up in bed, no distress. Able to walk without oxygen  Objective   Blood pressure 128/79, pulse 72, temperature 98.1 F (36.7 C), resp. rate 18, weight 105.7 kg, SpO2 97%.    FiO2 (%):  [28 %] 28 %   Intake/Output Summary (Last 24 hours) at 11/20/2023 1322 Last data filed at 11/20/2023 1033 Gross per 24 hour  Intake 236 ml  Output --  Net 236 ml   Filed Weights   11/19/23 0800  Weight: 105.7 kg    Examination: General: Well-appearing middle-aged male sitting up in bed HEENT: [place]/AT, MM pink/moist, PERRL,  Neuro: Alert and oriented x 3, nonfocal CV: s1s2 regular rate and rhythm, ejection systolic murmur 2 / 6 at base PULM: Clear bilateral, no accessory muscle use GI: soft, bowel sounds active in all 4 quadrants, non-tender, non-distended, tolerating oral diet Extremities: warm/dry, no edema  Skin: no rashes or lesions   Resolved Hospital Problem list     Assessment & Plan:  Acute hypoxic respiratory failure , resolved -Reports the episodes of significant, mid sternal chest pain with associated hypoxia occur very suddenly and and will happen during rest. The last episode woke him from sleep  History of asthma with concern for acute exacerbation on admission versus -Currently receiving ceftriaxone with plans to cover 5 days. On Breztri and Dupixent at baseline History of occupational lung disease  P:  Do not think that his current episodic dyspnea/hypoxia was related to asthma. He can resume Breztri on discharge and Dupixent. Outpatient pulmonary office follow-up  Severe aortic stenosis -2/21 echocardiogram with poor visualization of aortic valve but valve continues to appear bicuspid with severe aortic stenosis, mean gradient 41 mmHg, worse compared to echo 10/2022  -Plan  is for outpatient CVTS evaluation for aortic valve replacement   Best Practice (right click and "Reselect all SmartList Selections" daily)  Per  primary   Labs   CBC: Recent Labs  Lab 11/14/23 1635 11/15/23 0033 11/19/23 1547  WBC 13.8* 10.1  --   NEUTROABS 6.2  --   --   HGB 14.8 15.3 15.3  15.6  HCT 45.1 47.5 45.0  46.0  MCV 96.6 97.9  --   PLT 256 265  --     Basic Metabolic Panel: Recent Labs  Lab 11/14/23 1635 11/15/23 0033 11/19/23 1017 11/19/23 1547  NA 137 134* 141 139  139  K 4.5 5.0 4.3 4.5  4.7  CL 103 98 103  --   CO2 24 21* 28  --   GLUCOSE 156* 236* 139*  --   BUN 15 17 23*  --   CREATININE 1.36* 1.36* 1.14  --   CALCIUM 9.4 9.1 9.4  --    GFR: Estimated Creatinine Clearance: 87.1 mL/min (by C-G formula based on SCr of 1.14 mg/dL). Recent Labs  Lab 11/14/23 1635 11/15/23 0033 11/18/23 1348  PROCALCITON  --   --  <0.10  WBC 13.8* 10.1  --     Liver Function Tests: Recent Labs  Lab 11/14/23 1635 11/15/23 0033  AST 40 43*  ALT 25 25  ALKPHOS 52 55  BILITOT 0.6 0.8  PROT 7.5 8.1  ALBUMIN 4.4 4.6   No results for input(s): "LIPASE", "AMYLASE" in the last 168 hours. No results for input(s): "AMMONIA" in the last 168 hours.  ABG    Component Value Date/Time   PHART 7.344 (L) 11/19/2023 1547   PHART 7.354 11/19/2023 1547   PCO2ART 47.3 11/19/2023 1547   PCO2ART 45.8 11/19/2023 1547   PO2ART 34 (LL) 11/19/2023 1547   PO2ART 33 (LL) 11/19/2023 1547   HCO3 25.8 11/19/2023 1547   HCO3 25.5 11/19/2023 1547   TCO2 27 11/19/2023 1547   TCO2 27 11/19/2023 1547   ACIDBASEDEF 1.0 11/19/2023 1547   ACIDBASEDEF 1.0 11/19/2023 1547   O2SAT 61 11/19/2023 1547   O2SAT 61 11/19/2023 1547     Coagulation Profile: No results for input(s): "INR", "PROTIME" in the last 168 hours.  Cardiac Enzymes: No results for input(s): "CKTOTAL", "CKMB", "CKMBINDEX", "TROPONINI" in the last 168 hours.  HbA1C: Hgb A1c MFr Bld  Date/Time Value Ref Range Status  11/15/2023 12:33 AM 6.5 (H) 4.8 - 5.6 % Final    Comment:    (NOTE) Pre diabetes:          5.7%-6.4%  Diabetes:               >6.4%  Glycemic control for   <7.0% adults with diabetes   02/20/2023 12:51 AM 6.7 (H) 4.8 - 5.6 % Final    Comment:    (NOTE)         Prediabetes: 5.7 - 6.4         Diabetes: >6.4         Glycemic control for adults with diabetes: <7.0     CBG: Recent Labs  Lab 11/19/23 1411 11/19/23 1624 11/19/23 2055 11/20/23 0738 11/20/23 1159  GLUCAP 157* 183* 493* 211* 126*      Cyril Mourning MD. FCCP. [facility] Pager : [phone]  If no response to pager , please call [phone] until 7 pm After 7:00 pm call Elink  [phone]     11/20/2023, 1:22 PM

## 2023-11-20 NOTE — Plan of Care (Signed)
  Problem: Education: Goal: Ability to describe self-care measures that may prevent or decrease complications (Diabetes Survival Skills Education) will improve Outcome: Progressing Goal: Individualized Educational Video(s) Outcome: Progressing   Problem: Coping: Goal: Ability to adjust to condition or change in health will improve Outcome: Progressing   Problem: Fluid Volume: Goal: Ability to maintain a balanced intake and output will improve Outcome: Progressing   Problem: Health Behavior/Discharge Planning: Goal: Ability to identify and utilize available resources and services will improve Outcome: Progressing Goal: Ability to manage health-related needs will improve Outcome: Progressing   Problem: Metabolic: Goal: Ability to maintain appropriate glucose levels will improve Outcome: Progressing   Problem: Nutritional: Goal: Maintenance of adequate nutrition will improve Outcome: Progressing Goal: Progress toward achieving an optimal weight will improve Outcome: Progressing   Problem: Skin Integrity: Goal: Risk for impaired skin integrity will decrease Outcome: Progressing   Problem: Tissue Perfusion: Goal: Adequacy of tissue perfusion will improve Outcome: Progressing   Problem: Education: Goal: Knowledge of General Education information will improve Description: Including pain rating scale, medication(s)/side effects and non-pharmacologic comfort measures Outcome: Progressing   Problem: Health Behavior/Discharge Planning: Goal: Ability to manage health-related needs will improve Outcome: Progressing   Problem: Clinical Measurements: Goal: Ability to maintain clinical measurements within normal limits will improve Outcome: Progressing Goal: Will remain free from infection Outcome: Progressing Goal: Diagnostic test results will improve Outcome: Progressing Goal: Respiratory complications will improve Outcome: Progressing Goal: Cardiovascular complication will  be avoided Outcome: Progressing   Problem: Activity: Goal: Risk for activity intolerance will decrease Outcome: Progressing   Problem: Nutrition: Goal: Adequate nutrition will be maintained Outcome: Progressing   Problem: Coping: Goal: Level of anxiety will decrease Outcome: Progressing   Problem: Elimination: Goal: Will not experience complications related to bowel motility Outcome: Progressing Goal: Will not experience complications related to urinary retention Outcome: Progressing   Problem: Pain Managment: Goal: General experience of comfort will improve and/or be controlled Outcome: Progressing   Problem: Safety: Goal: Ability to remain free from injury will improve Outcome: Progressing   Problem: Skin Integrity: Goal: Risk for impaired skin integrity will decrease Outcome: Progressing   Problem: Education: Goal: Knowledge of disease or condition will improve Outcome: Progressing Goal: Knowledge of the prescribed therapeutic regimen will improve Outcome: Progressing Goal: Individualized Educational Video(s) Outcome: Progressing   Problem: Activity: Goal: Ability to tolerate increased activity will improve Outcome: Progressing Goal: Will verbalize the importance of balancing activity with adequate rest periods Outcome: Progressing   Problem: Respiratory: Goal: Ability to maintain a clear airway will improve Outcome: Progressing Goal: Levels of oxygenation will improve Outcome: Progressing Goal: Ability to maintain adequate ventilation will improve Outcome: Progressing   Problem: Education: Goal: Understanding of CV disease, CV risk reduction, and recovery process will improve Outcome: Progressing Goal: Individualized Educational Video(s) Outcome: Progressing   Problem: Activity: Goal: Ability to return to baseline activity level will improve Outcome: Progressing   Problem: Cardiovascular: Goal: Ability to achieve and maintain adequate  cardiovascular perfusion will improve Outcome: Progressing Goal: Vascular access site(s) Level 0-1 will be maintained Outcome: Progressing   Problem: Health Behavior/Discharge Planning: Goal: Ability to safely manage health-related needs after discharge will improve Outcome: Progressing

## 2023-11-20 NOTE — Telephone Encounter (Signed)
 Please arrange for hospital follow-up for moderate persistent asthma on Dupixent -4 to 6 weeks with APP

## 2023-11-20 NOTE — Discharge Summary (Signed)
 Physician Discharge Summary   Patient: Russell Burns MRN: [id number] DOB: 1965-10-01  Admit date:     11/14/2023  Discharge date: 11/20/23  Discharge Physician: Meredeth Ide   PCP: [name], MD   Recommendations at discharge:   Follow-up PCP in 1 week  Discharge Diagnoses: Principal Problem:   Acute hypoxemic respiratory failure (HCC) Active Problems:   Type 2 diabetes mellitus (HCC)   Hypertension associated with diabetes (HCC)   Hyperlipidemia associated with type 2 diabetes mellitus (HCC)   Chronic diastolic CHF (congestive heart failure) (HCC)   Malignant neoplasm of left kidney (HCC)   GERD (gastroesophageal reflux disease)   Asthmatic bronchitis , chronic (HCC)   Hypothyroidism   AKI (acute kidney injury) (HCC)  Resolved Problems:   * No resolved hospital problems. *  Hospital Course:   58 y.o. 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   Assessment and Plan:  Acute hypoxic respiratory failure Likely acute asthmatic bronchitis versus COPD exacerbation Cannot rule out concurrent pneumonia (CAP), POA  -Chest x-ray shows questionable bilateral edema -Given timing concerning for previous respiratory infection with progressive respiratory distress over the past 2 weeks -Viral panel negative -Completed antibiotic course for presumed pneumonia -Patient has known previous workspace inhalant reactivity, denies any recent exposures at work but this remains most likely etiology for his acute onset respiratory distress -Completed steroid high-dose taper in the hospital -Continue home inhaler/nebs -Compliant with  asthma inhalers, most recent flare 5 months ago, usually well-controlled -Pulmonology consulted, no new recommendations -Will follow-up with pulmonology as outpatient   Severe Aortic stenosis, progressive, POA -Prior echo notable for mod-severe - now classified as severe -Appreciate cardiology insight recommendations, patient continues to have no symptoms with exertion but continues to have episodes of transient hypoxia and respiratory distress even at rest -Underwent left and right heart cath yesterday.  As part of his workup for AVR and to assess for O2 requirement.  His right heart cath showed normal wedge pressure and PA pressures.  Plan to follow-up with CT surgery as outpatient. -Cardiology will make referral  Non-insulin-dependent type 2 diabetes  Will continue home medications   Essential hypertension: Continue home amlodipine, losartan  Hyperlipidemia: Continue atorvastatin, ezetimibe   Chronic kidney disease stage IIIa: Status post radical nephrectomy.  Chronic diastolic CHF: Appears compensated.  Diastolic in nature.  Unspecified gammopathy -followed outpatient        Consultants: Pulmonology, cardiology Procedures performed:  Disposition: Home Diet recommendation:  Discharge Diet Orders (From admission, onward)     Start     Ordered   11/20/23 0000  Diet - low sodium heart healthy        11/20/23 1324   11/17/23 0000  Diet - low sodium heart healthy        11/17/23 1355           Carb modified diet DISCHARGE MEDICATION: Allergies as of 11/20/2023       Reactions   Bee Venom Anaphylaxis   Cat Dander Itching, Swelling, Other (See Comments)   Runny nose, itchy/puffy eyes,         Medication List     STOP taking these medications  Dupixent 200 MG/1. Soaj Generic drug: Dupilumab   Mucinex DM Maximum Strength 60-1200 MG Tb12   promethazine-dextromethorphan 6.25-15 MG/5ML syrup Commonly known as: PROMETHAZINE-DM       TAKE these  medications    acetaminophen 500 MG tablet Commonly known as: TYLENOL Take 1,000 mg by mouth in the morning.   albuterol 108 (90 Base) MCG/ACT inhaler Commonly known as: VENTOLIN HFA Inhale 2 puffs into the lungs every 6 (six) hours as needed for wheezing or shortness of breath. What changed: Another medication with the same name was changed. Make sure you understand how and when to take each.   albuterol (2.5 MG/3ML) 0.083% nebulizer solution Commonly known as: PROVENTIL Take 3 mLs (2.5 mg total) by nebulization every 4 (four) hours as needed for wheezing or shortness of breath. What changed: See the new instructions.   amLODipine 10 MG tablet Commonly known as: NORVASC Take 10 mg by mouth daily.   atorvastatin 80 MG tablet Commonly known as: LIPITOR Take 80 mg by mouth daily.   Benadryl Allergy 25 MG tablet Generic drug: diphenhydrAMINE Take 50 mg by mouth 3 (three) times daily.   benzonatate 200 MG capsule Commonly known as: TESSALON Take 1 capsule (200 mg total) by mouth 3 (three) times daily as needed. What changed: reasons to take this   Breztri Aerosphere 160-9-4.8 MCG/ACT Aero Generic drug: Budeson-Glycopyrrol-Formoterol Inhale 2 puffs into the lungs 2 (two) times daily. Take 2 puffs first thing in am and then another 2 puffs about 12 hours later.   cetirizine 10 MG tablet Commonly known as: ZYRTEC Take 10 mg by mouth 2 (two) times daily.   Delsym 30 MG/5ML liquid Generic drug: dextromethorphan Take 15-30 mg by mouth in the morning and at bedtime.   EPINEPHrine 0.3 mg/0.3 mL Soaj injection Commonly known as: EPI-PEN Inject 0.3 mg into the muscle as needed for anaphylaxis.   ezetimibe 10 MG tablet Commonly known as: ZETIA Take 10 mg by mouth daily.   famotidine 20 MG tablet Commonly known as: PEPCID TAKE 1 TABLET BY MOUTH EVERY DAY AFTER SUPPER What changed:  how much to take how to take this when to take this additional instructions   fluticasone  50 MCG/ACT nasal spray Commonly known as: FLONASE Place 2 sprays into both nostrils in the morning and at bedtime.   HYDROcodone bit-homatropine 5-1.5 MG/5ML syrup Commonly known as: HYCODAN Take 5 mLs by mouth every 6 (six) hours as needed for cough.   ipratropium-albuterol 0.5-2.5 (3) MG/3ML Soln Commonly known as: DUONEB Take 3 mLs by nebulization every 3 (three) hours as needed (shortness of breath or wheezing).   Jardiance 25 MG Tabs tablet Generic drug: empagliflozin Take 25 mg by mouth daily.   levothyroxine 25 MCG tablet Commonly known as: Synthroid Take 1 tablet (25 mcg total) by mouth daily before breakfast.   losartan 100 MG tablet Commonly known as: COZAAR Take 100 mg by mouth daily.   metFORMIN 1000 MG tablet Commonly known as: GLUCOPHAGE Take 1,000 mg by mouth in the morning and at bedtime.   Mounjaro 10 MG/0.5ML Pen Generic drug: tirzepatide Inject 10 mg into the skin once a week. What changed: when to take this   nystatin 100000 UNIT/ML suspension Commonly known as: MYCOSTATIN Take 5 mLs by mouth 4 (four) times daily as needed (as directed for thrush).   OneTouch Verio test strip Generic drug: glucose blood 1 each by Other route 5 (five) times daily.   prochlorperazine 10 MG tablet Commonly known as:  COMPAZINE Take 1 tablet (10 mg total) by mouth every 6 (six) hours as needed. What changed: reasons to take this   Repatha 140 MG/ML Sosy Generic drug: Evolocumab Inject 140 mg into the skin every 14 (fourteen) days.   tamsulosin 0.4 MG Caps capsule Commonly known as: FLOMAX Take 1 capsule (0.4 mg total) by mouth daily.        Discharge Exam: Filed Weights   11/19/23 0800  Weight: 105.7 kg   General-appears in no acute distress Heart-S1-S2, regular, no murmur auscultated Lungs-clear to auscultation bilaterally, no wheezing or crackles auscultated Abdomen-soft, nontender, no organomegaly Extremities-no edema in the lower  extremities Neuro-alert, oriented x3, no focal deficit noted  Condition at discharge: good  The results of significant diagnostics from this hospitalization (including imaging, microbiology, ancillary and laboratory) are listed below for reference.   Imaging Studies: CARDIAC CATHETERIZATION Result Date: 11/20/2023 HEMODYNAMICS: RA:       5 mmHg (mean) RV:       30/1, 5 mmHg PA:       30/8 mmHg (16 mean) PCWP: 8 mmHg (mean)    Estimated Fick CO/CI   4.54L/min, 2L/min/m2 IMPRESSION: Left dominant, normal coronary arteriography. Normal left and right heart filling pressures. Normal pulmonary artery pressures. Moderately reduced cardiac output in the setting of severe AS. RECOMMENDATIONS: Continue workup for valve replacement.   ECHOCARDIOGRAM COMPLETE Result Date: 11/15/2023    ECHOCARDIOGRAM REPORT   Patient Name:   Dvon Jiles III Date of Exam: 11/15/2023 Medical Rec #:  [id number]         Height:       70.0 in Accession #:    [id number]        Weight:       244.1 lb Date of Birth:  [birth year]         BSA:          2.272 m Patient Age:    57 years          BP:           110/74 mmHg Patient Gender: M                 HR:           80 bpm. Exam Location:  Inpatient Procedure: 2D Echo, Cardiac Doppler, Color Doppler and Intracardiac            Opacification Agent (Both Spectral and Color Flow Doppler were            utilized during procedure). Indications:    CHF-Acute Diastolic I50.31  History:        Patient has prior history of Echocardiogram examinations, most                 recent 02/15/2023. CHF; Risk Factors:Hypertension and Diabetes.  Sonographer:    Webb Laws Referring Phys: Patrice.Shin MOHAMMAD L GARBA IMPRESSIONS  1. Aortic valve not well visualized but possibly bicuspid; severe AS (mean gradient 41 mmHg; AVA 0.56 cm2; DI 0.18); AS worse compared to 11/15/23.  2. Left ventricular ejection fraction, by estimation, is 60 to 65%. The left ventricle has normal function. The left ventricle has no  regional wall motion abnormalities. The left ventricular internal cavity size was mildly dilated. Left ventricular diastolic parameters are consistent with Grade I diastolic dysfunction (impaired relaxation).  3. Right ventricular systolic function is normal. The right ventricular size is normal.  4. The mitral valve is normal in structure. No evidence of mitral valve regurgitation. No  evidence of mitral stenosis.  5. The aortic valve has an indeterminant number of cusps. Aortic valve regurgitation is not visualized. Severe aortic valve stenosis.  6. Aortic dilatation noted. There is mild dilatation of the aortic root, measuring 40 mm.  7. The inferior vena cava is normal in size with greater than 50% respiratory variability, suggesting right atrial pressure of 3 mmHg. FINDINGS  Left Ventricle: Left ventricular ejection fraction, by estimation, is 60 to 65%. The left ventricle has normal function. The left ventricle has no regional wall motion abnormalities. Definity contrast agent was given IV to delineate the left ventricular  endocardial borders. Strain imaging was not performed. The left ventricular internal cavity size was mildly dilated. There is no left ventricular hypertrophy. Left ventricular diastolic parameters are consistent with Grade I diastolic dysfunction (impaired relaxation). Right Ventricle: The right ventricular size is normal. Right ventricular systolic function is normal. Left Atrium: Left atrial size was normal in size. Right Atrium: Right atrial size was normal in size. Pericardium: There is no evidence of pericardial effusion. Mitral Valve: The mitral valve is normal in structure. Mild mitral annular calcification. No evidence of mitral valve regurgitation. No evidence of mitral valve stenosis. Tricuspid Valve: The tricuspid valve is normal in structure. Tricuspid valve regurgitation is not demonstrated. No evidence of tricuspid stenosis. Aortic Valve: The aortic valve has an indeterminant  number of cusps. Aortic valve regurgitation is not visualized. Severe aortic stenosis is present. Aortic valve mean gradient measures 41.4 mmHg. Aortic valve peak gradient measures 67.7 mmHg. Aortic valve area, by VTI measures 0.56 cm. Pulmonic Valve: The pulmonic valve was not well visualized. Pulmonic valve regurgitation is not visualized. No evidence of pulmonic stenosis. Aorta: Aortic dilatation noted. There is mild dilatation of the aortic root, measuring 40 mm. Venous: The inferior vena cava is normal in size with greater than 50% respiratory variability, suggesting right atrial pressure of 3 mmHg. IAS/Shunts: No atrial level shunt detected by color flow Doppler. Additional Comments: Aortic valve not well visualized but possibly bicuspid; severe AS (mean gradient 41 mmHg; AVA 0.56 cm2; DI 0.18); AS worse compared to 11/15/23. 3D imaging was not performed.  LEFT VENTRICLE PLAX 2D LVIDd:         5.50 cm      Diastology LVIDs:         3.40 cm      LV e' medial:    5.98 cm/s LV PW:         1.10 cm      LV E/e' medial:  13.0 LV IVS:        0.80 cm      LV e' lateral:   8.49 cm/s LVOT diam:     2.00 cm      LV E/e' lateral: 9.2 LV SV:         53 LV SV Index:   23 LVOT Area:     3.14 cm  LV Volumes (MOD) LV vol d, MOD A2C: 131.0 ml LV vol d, MOD A4C: 107.0 ml LV vol s, MOD A2C: 64.0 ml LV vol s, MOD A4C: 56.9 ml LV SV MOD A2C:     67.0 ml LV SV MOD A4C:     107.0 ml LV SV MOD BP:      56.6 ml RIGHT VENTRICLE            IVC RV S prime:     9.79 cm/s  IVC diam: 1.70 cm TAPSE (M-mode): 3.5 cm LEFT ATRIUM  Index        RIGHT ATRIUM           Index LA diam:        3.90 cm 1.72 cm/m   RA Area:     10.50 cm LA Vol (A2C):   42.6 ml 18.75 ml/m  RA Volume:   17.70 ml  7.79 ml/m LA Vol (A4C):   40.8 ml 17.96 ml/m LA Biplane Vol: 44.7 ml 19.68 ml/m  AORTIC VALVE AV Area (Vmax):    0.61 cm AV Area (Vmean):   0.57 cm AV Area (VTI):     0.56 cm AV Vmax:           411.40 cm/s AV Vmean:          301.400 cm/s AV  VTI:            0.940 m AV Peak Grad:      67.7 mmHg AV Mean Grad:      41.4 mmHg LVOT Vmax:         80.00 cm/s LVOT Vmean:        54.700 cm/s LVOT VTI:          0.169 m LVOT/AV VTI ratio: 0.18  AORTA Ao Root diam: 3.20 cm Ao Asc diam:  4.00 cm MITRAL VALVE MV Area (PHT): 3.48 cm    SHUNTS MV Decel Time: 218 msec    Systemic VTI:  0.17 m MV E velocity: 78.00 cm/s  Systemic Diam: 2.00 cm MV A velocity: 83.60 cm/s MV E/A ratio:  0.93 Olga Millers MD Electronically signed by Olga Millers MD Signature Date/Time: 11/15/2023/2:08:30 PM    Final    DG Chest Port 1 View Result Date: 11/14/2023 CLINICAL DATA:  Two-week history of cough and shortness of breath EXAM: PORTABLE CHEST 1 VIEW COMPARISON:  Chest radiograph dated 06/28/2023 FINDINGS: Normal lung volumes. No focal consolidations. No pleural effusion or pneumothorax. The heart size and mediastinal contours are within normal limits. No acute osseous abnormality. IMPRESSION: No acute disease. Electronically Signed   By: Agustin Cree M.D.   On: 11/14/2023 16:42   CT CHEST ABDOMEN PELVIS WO CONTRAST Result Date: 11/06/2023 CLINICAL DATA:  Kidney cancer, metastatic disease evaluation. * Tracking Code: BO * EXAM: CT CHEST, ABDOMEN AND PELVIS WITHOUT CONTRAST TECHNIQUE: Multidetector CT imaging of the chest, abdomen and pelvis was performed following the standard protocol without IV contrast. RADIATION DOSE REDUCTION: This exam was performed according to the departmental dose-optimization program which includes automated exposure control, adjustment of the mA and/or kV according to patient size and/or use of iterative reconstruction technique. COMPARISON:  Multiple priors including most recent CT July 04, 2023 FINDINGS: CT CHEST FINDINGS Cardiovascular: Aortic atherosclerosis. 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 Coronary artery calcifications. Calcifications of the aortic valve. Normal size heart. No significant pericardial  effusion/thickening. Mediastinum/Nodes: Prominent partially visualized low cervical lymph nodes are similar prior examination. No suspicious thyroid nodule. Prominent mediastinal lymph nodes are stable from prior examination. No pathologically enlarged mediastinal, hilar or axillary lymph nodes. The esophagus is grossly unremarkable. Lungs/Pleura: Stable scattered pulmonary nodules for instance a 2 mm nodule in the right lung apex on image 42/6. No new suspicious pulmonary nodules or masses. Musculoskeletal: No aggressive lytic or blastic lesion of bone. Multilevel degenerative changes spine. CT ABDOMEN PELVIS FINDINGS Hepatobiliary: No suspicious hepatic lesion on noncontrast enhanced examination of the liver. Gallbladder is unremarkable. No biliary ductal dilation. Pancreas: No pancreatic ductal dilation or evidence of acute inflammation  Spleen: No splenomegaly. Adrenals/Urinary Tract: No suspicious adrenal nodule/mass. Left kidney is surgically absent without new suspicious nodularity in the nephrectomy bed. Unremarkable noncontrast enhanced appearance of the right kidney without hydronephrosis. Urinary bladder is unremarkable for degree of distension. Stomach/Bowel: No radiopaque enteric contrast material was administered. Stomach is unremarkable for degree of distension. Normal appendix. Colonic diverticulosis. Vascular/Lymphatic: Normal caliber abdominal aorta. Aortic atherosclerosis. Smooth IVC contours. Stable 14 mm portacaval lymph node on image 66/2. No concerning abdominal or pelvic adenopathy. Reproductive: Prostate is unremarkable. Other: No significant abdominopelvic free fluid. Small fat containing ventral hernias. Postsurgical change in the abdominal wall. Musculoskeletal: No aggressive lytic or blastic lesion of bone. Chronic bilateral pars defects at L5. IMPRESSION: 1. Stable examination status post left nephrectomy without evidence of local recurrence or metastatic disease in the chest, abdomen or  pelvis on this noncontrast enhanced examination. 2. 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 3.  Aortic Atherosclerosis (ICD10-I70.0). Electronically Signed   By: Maudry Mayhew M.D.   On: 11/06/2023 15:05    Microbiology: Results for orders placed or performed during the hospital encounter of 11/14/23  Resp panel by RT-PCR (RSV, Flu A&B, Covid) Anterior Nasal Swab     Status: None   Collection Time: 11/14/23  4:35 PM   Specimen: Anterior Nasal Swab  Result Value Ref Range Status   SARS Coronavirus 2 by RT PCR NEGATIVE NEGATIVE Final    Comment: (NOTE) SARS-CoV-2 target nucleic acids are NOT DETECTED.  The SARS-CoV-2 RNA is generally detectable in upper respiratory specimens during the acute phase of infection. The lowest concentration of SARS-CoV-2 viral copies this assay can detect is 138 copies/mL. A negative result does not preclude SARS-Cov-2 infection and should not be used as the sole basis for treatment or other patient management decisions. A negative result may occur with  improper specimen collection/handling, submission of specimen other than nasopharyngeal swab, presence of viral mutation(s) within the areas targeted by this assay, and inadequate number of viral copies(<138 copies/mL). A negative result must be combined with clinical observations, patient history, and epidemiological information. The expected result is Negative.  Fact Sheet for Patients:  BloggerCourse.com  Fact Sheet for Healthcare Providers:  SeriousBroker.it  This test is no t yet approved or cleared by the [place] FDA and  has been authorized for detection and/or diagnosis of SARS-CoV-2 by FDA under an Emergency Use Authorization (EUA). This EUA will remain  in effect (meaning this test can be used) for the duration of the COVID-19 declaration under Section 564(b)(1) of the Act, 21 U.S.C.section 360bbb-3(b)(1),  unless the authorization is terminated  or revoked sooner.       Influenza A by PCR NEGATIVE NEGATIVE Final   Influenza B by PCR NEGATIVE NEGATIVE Final    Comment: (NOTE) The Xpert Xpress SARS-CoV-2/FLU/RSV plus assay is intended as an aid in the diagnosis of influenza from Nasopharyngeal swab specimens and should not be used as a sole basis for treatment. Nasal washings and aspirates are unacceptable for Xpert Xpress SARS-CoV-2/FLU/RSV testing.  Fact Sheet for Patients: BloggerCourse.com  Fact Sheet for Healthcare Providers: SeriousBroker.it  This test is not yet approved or cleared by the [place] FDA and has been authorized for detection and/or diagnosis of SARS-CoV-2 by FDA under an Emergency Use Authorization (EUA). This EUA will remain in effect (meaning this test can be used) for the duration of the COVID-19 declaration under Section 564(b)(1) of the Act, 21 U.S.C. section 360bbb-3(b)(1), unless the authorization is terminated  or revoked.     Resp Syncytial Virus by PCR NEGATIVE NEGATIVE Final    Comment: (NOTE) Fact Sheet for Patients: BloggerCourse.com  Fact Sheet for Healthcare Providers: SeriousBroker.it  This test is not yet approved or cleared by the [place] FDA and has been authorized for detection and/or diagnosis of SARS-CoV-2 by FDA under an Emergency Use Authorization (EUA). This EUA will remain in effect (meaning this test can be used) for the duration of the COVID-19 declaration under Section 564(b)(1) of the Act, 21 U.S.C. section 360bbb-3(b)(1), unless the authorization is terminated or revoked.  Performed at [facility], [address] [place], [place] [address]     Labs: CBC: Recent Labs  Lab 11/14/23 1635 11/15/23 0033 11/19/23 1547  WBC 13.8* 10.1  --   NEUTROABS 6.2  --   --   HGB 14.8 15.3 15.3  15.6   HCT 45.1 47.5 45.0  46.0  MCV 96.6 97.9  --   PLT 256 265  --    Basic Metabolic Panel: Recent Labs  Lab 11/14/23 1635 11/15/23 0033 11/19/23 1017 11/19/23 1547  NA 137 134* 141 139  139  K 4.5 5.0 4.3 4.5  4.7  CL 103 98 103  --   CO2 24 21* 28  --   GLUCOSE 156* 236* 139*  --   BUN 15 17 23*  --   CREATININE 1.36* 1.36* 1.14  --   CALCIUM 9.4 9.1 9.4  --    Liver Function Tests: Recent Labs  Lab 11/14/23 1635 11/15/23 0033  AST 40 43*  ALT 25 25  ALKPHOS 52 55  BILITOT 0.6 0.8  PROT 7.5 8.1  ALBUMIN 4.4 4.6   CBG: Recent Labs  Lab 11/19/23 1411 11/19/23 1624 11/19/23 2055 11/20/23 0738 11/20/23 1159  GLUCAP 157* 183* 493* 211* 126*    Discharge time spent: greater than 30 minutes.  Signed: Meredeth Ide, MD [facility] 11/20/2023

## 2023-11-20 NOTE — Telephone Encounter (Signed)
 Pt has been scheduled with Dr Dorris Fetch 12/12/23 @ 4:15 pm.

## 2023-11-20 NOTE — Progress Notes (Signed)
 SATURATION QUALIFICATIONS: (This note is used to comply with regulatory documentation for home oxygen)  Patient Saturations on Room Air at Rest = 95%  Patient Saturations on Room Air while Ambulating = 94%  Patient tolerated ambulating on RA without difficulty. He denied any difficulty breathing, SOB, dizziness, or lightheadedness.

## 2023-11-20 NOTE — Progress Notes (Signed)
   Patient Name: Russell Burns Date of Encounter: 11/20/2023 [facility] Cardiologist: Donato Schultz, MD   Interval Summary  .    Russell Burns is doing well today.  Still on oxygen.   Vital Signs .    Vitals:   11/19/23 1800 11/19/23 1815 11/20/23 0527 11/20/23 0859  BP: 121/75 107/82 134/81   Pulse: 78 76 76   Resp: (!) 23 (!) 21 18   Temp:   98 F (36.7 C)   TempSrc:   Oral   SpO2: 94% 94% 97% 96%  Weight:        Intake/Output Summary (Last 24 hours) at 11/20/2023 1052 Last data filed at 11/20/2023 1033 Gross per 24 hour  Intake 236 ml  Output --  Net 236 ml      11/19/2023    8:00 AM 11/11/2023    1:51 PM 08/29/2023   11:32 AM  Last 3 Weights  Weight (lbs) 233 lb 244 lb 1.6 oz 249 lb 12.8 oz  Weight (kg) 105.688 kg 110.723 kg 113.309 kg      Telemetry/ECG    Sinus rhythm - Personally Reviewed  Physical Exam .   GEN: No acute distress.   Neck: No JVD Cardiac: RRR, no rubs, or gallops. 3/6 systolic murmur RUSB Respiratory: Clear to auscultation bilaterally. GI: Soft, nontender, non-distended  MS: No edema  Assessment & Plan .     Russell Burns is a 58 y.o. male with a hx of significant for severe aortic stenosis, asthmatic bronchitis, type 2 diabetes, renal cell carcinoma s/p nephrectomy no in remission, and prior history of occupational lung disease who presented to the ER with complaints of shortness of breath and persistent cough.  He is being seen  for the evaluation of SOB with severe AS at the request of Dr. Natale Milch.   Hypoxemia He has no pulmonary pathology.  No signs of a viral illness.  The only likely explanation is his reduced cardiac index.  He does not have decompensated heart failure.  Bicuspid Severe Aortic Valve Stenosis Thoracic aortic aneurysm Patient admitted with acute hypoxic respiratory failure (Acute bronchitis vs CAP). Cardiology asked to see patient given severe AS. Echo from this admission shows mean gradient 41.35mmHg,  AVA of 0.56 cm2, dimensionless index of 0.18.  His EF is normal. He has normal renal function. -He had a left heart cath right heart cath yesterday.  As part of his workup for AVR and to assess the etiology for his o2 requirement -His right heart cath showed normal wedge and mean PA pressures.  He does have mild to moderately reduced cardiac output -Will plan to refer patient to Dr. Dorris Fetch for outpatient CT surgery evaluation, for consideration for AVR.  Otherwise, from a cardiac perspective he can be discharged.    For questions or updates, please contact [facility] Please consult [email or web address] for contact info under        Signed, Kerrington Greenhalgh, Alben Spittle, MD

## 2023-11-25 DIAGNOSIS — E1165 Type 2 diabetes mellitus with hyperglycemia: Secondary | ICD-10-CM | POA: Diagnosis not present

## 2023-11-25 DIAGNOSIS — I130 Hypertensive heart and chronic kidney disease with heart failure and stage 1 through stage 4 chronic kidney disease, or unspecified chronic kidney disease: Secondary | ICD-10-CM | POA: Diagnosis not present

## 2023-11-28 ENCOUNTER — Other Ambulatory Visit: Payer: Self-pay | Admitting: Internal Medicine

## 2023-11-28 DIAGNOSIS — R053 Chronic cough: Secondary | ICD-10-CM

## 2023-12-02 DIAGNOSIS — J709 Respiratory conditions due to unspecified external agent: Secondary | ICD-10-CM | POA: Diagnosis not present

## 2023-12-02 DIAGNOSIS — R058 Other specified cough: Secondary | ICD-10-CM | POA: Diagnosis not present

## 2023-12-05 DIAGNOSIS — R0902 Hypoxemia: Secondary | ICD-10-CM | POA: Diagnosis not present

## 2023-12-06 ENCOUNTER — Observation Stay (HOSPITAL_COMMUNITY)
Admission: EM | Admit: 2023-12-06 | Discharge: 2023-12-07 | Disposition: A | Discharge status: Home / Self Care | Attending: Internal Medicine | Admitting: Internal Medicine

## 2023-12-06 ENCOUNTER — Emergency Department (HOSPITAL_COMMUNITY)

## 2023-12-06 ENCOUNTER — Encounter (HOSPITAL_COMMUNITY): Payer: Self-pay

## 2023-12-06 DIAGNOSIS — K219 Gastro-esophageal reflux disease without esophagitis: Secondary | ICD-10-CM | POA: Diagnosis not present

## 2023-12-06 DIAGNOSIS — F17220 Nicotine dependence, chewing tobacco, uncomplicated: Secondary | ICD-10-CM | POA: Diagnosis not present

## 2023-12-06 DIAGNOSIS — I350 Nonrheumatic aortic (valve) stenosis: Secondary | ICD-10-CM | POA: Diagnosis not present

## 2023-12-06 DIAGNOSIS — N189 Chronic kidney disease, unspecified: Secondary | ICD-10-CM | POA: Diagnosis not present

## 2023-12-06 DIAGNOSIS — N400 Enlarged prostate without lower urinary tract symptoms: Secondary | ICD-10-CM | POA: Diagnosis not present

## 2023-12-06 DIAGNOSIS — E785 Hyperlipidemia, unspecified: Secondary | ICD-10-CM | POA: Diagnosis not present

## 2023-12-06 DIAGNOSIS — Z794 Long term (current) use of insulin: Secondary | ICD-10-CM | POA: Insufficient documentation

## 2023-12-06 DIAGNOSIS — E039 Hypothyroidism, unspecified: Secondary | ICD-10-CM | POA: Diagnosis not present

## 2023-12-06 DIAGNOSIS — E119 Type 2 diabetes mellitus without complications: Secondary | ICD-10-CM

## 2023-12-06 DIAGNOSIS — J984 Other disorders of lung: Secondary | ICD-10-CM

## 2023-12-06 DIAGNOSIS — J441 Chronic obstructive pulmonary disease with (acute) exacerbation: Principal | ICD-10-CM

## 2023-12-06 DIAGNOSIS — J9621 Acute and chronic respiratory failure with hypoxia: Secondary | ICD-10-CM | POA: Diagnosis present

## 2023-12-06 DIAGNOSIS — E1122 Type 2 diabetes mellitus with diabetic chronic kidney disease: Secondary | ICD-10-CM | POA: Insufficient documentation

## 2023-12-06 DIAGNOSIS — I129 Hypertensive chronic kidney disease with stage 1 through stage 4 chronic kidney disease, or unspecified chronic kidney disease: Secondary | ICD-10-CM | POA: Insufficient documentation

## 2023-12-06 DIAGNOSIS — J4541 Moderate persistent asthma with (acute) exacerbation: Secondary | ICD-10-CM

## 2023-12-06 DIAGNOSIS — I152 Hypertension secondary to endocrine disorders: Secondary | ICD-10-CM | POA: Diagnosis present

## 2023-12-06 DIAGNOSIS — Z79899 Other long term (current) drug therapy: Secondary | ICD-10-CM | POA: Insufficient documentation

## 2023-12-06 DIAGNOSIS — Z1152 Encounter for screening for COVID-19: Secondary | ICD-10-CM | POA: Insufficient documentation

## 2023-12-06 DIAGNOSIS — I700 Atherosclerosis of aorta: Secondary | ICD-10-CM | POA: Diagnosis not present

## 2023-12-06 DIAGNOSIS — J9601 Acute respiratory failure with hypoxia: Secondary | ICD-10-CM | POA: Diagnosis not present

## 2023-12-06 DIAGNOSIS — E1169 Type 2 diabetes mellitus with other specified complication: Secondary | ICD-10-CM | POA: Diagnosis present

## 2023-12-06 DIAGNOSIS — Z7984 Long term (current) use of oral hypoglycemic drugs: Secondary | ICD-10-CM | POA: Diagnosis not present

## 2023-12-06 DIAGNOSIS — R0602 Shortness of breath: Secondary | ICD-10-CM | POA: Diagnosis not present

## 2023-12-06 DIAGNOSIS — E1159 Type 2 diabetes mellitus with other circulatory complications: Secondary | ICD-10-CM | POA: Diagnosis present

## 2023-12-06 LAB — CBC WITH DIFFERENTIAL/PLATELET
Abs Immature Granulocytes: 0.03 10*3/uL (ref 0.00–0.07)
Basophils Absolute: 0.1 10*3/uL (ref 0.0–0.1)
Basophils Relative: 1 %
Eosinophils Absolute: 1.5 10*3/uL — ABNORMAL HIGH (ref 0.0–0.5)
Eosinophils Relative: 15 %
HCT: 50.8 % (ref 39.0–52.0)
Hemoglobin: 15.9 g/dL (ref 13.0–17.0)
Immature Granulocytes: 0 %
Lymphocytes Relative: 14 %
Lymphs Abs: 1.3 10*3/uL (ref 0.7–4.0)
MCH: 30.8 pg (ref 26.0–34.0)
MCHC: 31.3 g/dL (ref 30.0–36.0)
MCV: 98.3 fL (ref 80.0–100.0)
Monocytes Absolute: 0.6 10*3/uL (ref 0.1–1.0)
Monocytes Relative: 6 %
Neutro Abs: 5.9 10*3/uL (ref 1.7–7.7)
Neutrophils Relative %: 64 %
Platelets: 235 10*3/uL (ref 150–400)
RBC: 5.17 MIL/uL (ref 4.22–5.81)
RDW: 12.2 % (ref 11.5–15.5)
WBC: 9.4 10*3/uL (ref 4.0–10.5)
nRBC: 0 % (ref 0.0–0.2)

## 2023-12-06 LAB — COMPREHENSIVE METABOLIC PANEL
ALT: 24 U/L (ref 0–44)
AST: 23 U/L (ref 15–41)
Albumin: 4.9 g/dL (ref 3.5–5.0)
Alkaline Phosphatase: 75 U/L (ref 38–126)
Anion gap: 11 (ref 5–15)
BUN: 14 mg/dL (ref 6–20)
CO2: 23 mmol/L (ref 22–32)
Calcium: 9.5 mg/dL (ref 8.9–10.3)
Chloride: 103 mmol/L (ref 98–111)
Creatinine, Ser: 1.07 mg/dL (ref 0.61–1.24)
GFR, Estimated: >60 mL/min (ref >60)
Glucose, Bld: 145 mg/dL — ABNORMAL HIGH (ref 70–99)
Potassium: 4.6 mmol/L (ref 3.5–5.1)
Sodium: 137 mmol/L (ref 135–145)
Total Bilirubin: 0.8 mg/dL (ref 0.0–1.2)
Total Protein: 8 g/dL (ref 6.5–8.1)

## 2023-12-06 LAB — BLOOD GAS, VENOUS
Acid-base deficit: 2.7 mmol/L — ABNORMAL HIGH (ref 0.0–2.0)
Bicarbonate: 25.3 mmol/L (ref 20.0–28.0)
O2 Saturation: 92.5 %
Patient temperature: 37
pCO2, Ven: 55 mmHg (ref 44–60)
pH, Ven: 7.27 (ref 7.25–7.43)
pO2, Ven: 63 mmHg — ABNORMAL HIGH (ref 32–45)

## 2023-12-06 LAB — TROPONIN I (HIGH SENSITIVITY)
Troponin I (High Sensitivity): 11 ng/L (ref <18)
Troponin I (High Sensitivity): 10 ng/L (ref <18)

## 2023-12-06 LAB — BRAIN NATRIURETIC PEPTIDE: B Natriuretic Peptide: 45.8 pg/mL (ref 0.0–100.0)

## 2023-12-06 LAB — RESP PANEL BY RT-PCR (RSV, FLU A&B, COVID)  RVPGX2
Influenza A by PCR: NEGATIVE
Influenza B by PCR: NEGATIVE
Resp Syncytial Virus by PCR: NEGATIVE
SARS Coronavirus 2 by RT PCR: NEGATIVE

## 2023-12-06 LAB — GLUCOSE, CAPILLARY
Glucose-Capillary: 206 mg/dL — ABNORMAL HIGH (ref 70–99)
Glucose-Capillary: 234 mg/dL — ABNORMAL HIGH (ref 70–99)

## 2023-12-06 MED ORDER — PREDNISONE 20 MG PO TABS
40.0000 mg | ORAL_TABLET | Freq: Every day | ORAL | Status: DC
  Administered 2023-12-07: 40 mg via ORAL
  Filled 2023-12-06: qty 2

## 2023-12-06 MED ORDER — PHENOL 1.4 % MT LIQD
1.0000 | OROMUCOSAL | Status: DC | PRN
  Administered 2023-12-07: 1 via OROMUCOSAL
  Filled 2023-12-06: qty 177

## 2023-12-06 MED ORDER — ONDANSETRON HCL 4 MG/2ML IJ SOLN: 4.0000 mg | Freq: Four times a day (QID) | INTRAMUSCULAR | Status: DC | PRN

## 2023-12-06 MED ORDER — ACETAMINOPHEN 325 MG PO TABS: 650.0000 mg | ORAL_TABLET | Freq: Four times a day (QID) | ORAL | Status: DC | PRN

## 2023-12-06 MED ORDER — FLUTICASONE FUROATE-VILANTEROL 100-25 MCG/ACT IN AEPB
1.0000 | INHALATION_SPRAY | Freq: Every day | RESPIRATORY_TRACT | Status: DC
  Administered 2023-12-07: 1 via RESPIRATORY_TRACT
  Filled 2023-12-06: qty 28

## 2023-12-06 MED ORDER — IPRATROPIUM-ALBUTEROL 0.5-2.5 (3) MG/3ML IN SOLN
3.0000 mL | Freq: Once | RESPIRATORY_TRACT | Status: AC
  Administered 2023-12-06: 3 mL via RESPIRATORY_TRACT
  Filled 2023-12-06: qty 3

## 2023-12-06 MED ORDER — FLUTICASONE PROPIONATE 50 MCG/ACT NA SUSP
2.0000 | Freq: Every day | NASAL | Status: DC
  Administered 2023-12-06 – 2023-12-07 (×2): 2 via NASAL
  Filled 2023-12-06: qty 16

## 2023-12-06 MED ORDER — BENZONATATE 100 MG PO CAPS
200.0000 mg | ORAL_CAPSULE | Freq: Three times a day (TID) | ORAL | Status: DC | PRN
  Administered 2023-12-06: 200 mg via ORAL
  Filled 2023-12-06: qty 2

## 2023-12-06 MED ORDER — INSULIN ASPART 100 UNIT/ML IJ SOLN
0.0000 [IU] | Freq: Every day | INTRAMUSCULAR | Status: DC
  Administered 2023-12-06: 2 [IU] via SUBCUTANEOUS
  Filled 2023-12-06: qty 0.05

## 2023-12-06 MED ORDER — PNEUMOCOCCAL 20-VAL CONJ VACC 0.5 ML IM SUSY
0.5000 mL | PREFILLED_SYRINGE | INTRAMUSCULAR | Status: DC
  Filled 2023-12-06: qty 0.5

## 2023-12-06 MED ORDER — METHYLPREDNISOLONE SODIUM SUCC 125 MG IJ SOLR
125.0000 mg | Freq: Once | INTRAMUSCULAR | Status: AC
  Administered 2023-12-06: 125 mg via INTRAVENOUS
  Filled 2023-12-06: qty 2

## 2023-12-06 MED ORDER — TRAZODONE HCL 50 MG PO TABS
25.0000 mg | ORAL_TABLET | Freq: Every evening | ORAL | Status: DC | PRN
  Filled 2023-12-06: qty 1

## 2023-12-06 MED ORDER — BUDESON-GLYCOPYRROL-FORMOTEROL 160-9-4.8 MCG/ACT IN AERO: 2.0000 | INHALATION_SPRAY | Freq: Two times a day (BID) | RESPIRATORY_TRACT | Status: DC

## 2023-12-06 MED ORDER — LORATADINE 10 MG PO TABS
10.0000 mg | ORAL_TABLET | Freq: Every day | ORAL | Status: DC
  Administered 2023-12-06 – 2023-12-07 (×2): 10 mg via ORAL
  Filled 2023-12-06 (×2): qty 1

## 2023-12-06 MED ORDER — INSULIN ASPART 100 UNIT/ML IJ SOLN
0.0000 [IU] | Freq: Three times a day (TID) | INTRAMUSCULAR | Status: DC
  Administered 2023-12-07: 5 [IU] via SUBCUTANEOUS
  Filled 2023-12-06: qty 0.15

## 2023-12-06 MED ORDER — TAMSULOSIN HCL 0.4 MG PO CAPS
0.4000 mg | ORAL_CAPSULE | Freq: Every day | ORAL | Status: DC
  Administered 2023-12-06 – 2023-12-07 (×2): 0.4 mg via ORAL
  Filled 2023-12-06 (×2): qty 1

## 2023-12-06 MED ORDER — FAMOTIDINE 20 MG PO TABS
20.0000 mg | ORAL_TABLET | Freq: Every day | ORAL | Status: DC
  Administered 2023-12-06: 20 mg via ORAL
  Filled 2023-12-06: qty 1

## 2023-12-06 MED ORDER — LEVOTHYROXINE SODIUM 25 MCG PO TABS
25.0000 ug | ORAL_TABLET | Freq: Every day | ORAL | Status: DC
  Administered 2023-12-07: 25 ug via ORAL
  Filled 2023-12-06: qty 1

## 2023-12-06 MED ORDER — ALBUTEROL SULFATE (2.5 MG/3ML) 0.083% IN NEBU
2.5000 mg | INHALATION_SOLUTION | RESPIRATORY_TRACT | Status: DC
  Administered 2023-12-06 – 2023-12-07 (×3): 2.5 mg via RESPIRATORY_TRACT
  Filled 2023-12-06 (×5): qty 3

## 2023-12-06 MED ORDER — ONDANSETRON HCL 4 MG PO TABS: 4.0000 mg | ORAL_TABLET | Freq: Four times a day (QID) | ORAL | Status: DC | PRN

## 2023-12-06 MED ORDER — ENOXAPARIN SODIUM 40 MG/0.4ML IJ SOSY: 40.0000 mg | PREFILLED_SYRINGE | INTRAMUSCULAR | Status: DC

## 2023-12-06 MED ORDER — ACETAMINOPHEN 650 MG RE SUPP: 650.0000 mg | Freq: Four times a day (QID) | RECTAL | Status: DC | PRN

## 2023-12-06 MED ORDER — ALBUTEROL SULFATE (2.5 MG/3ML) 0.083% IN NEBU
2.5000 mg | INHALATION_SOLUTION | RESPIRATORY_TRACT | Status: DC | PRN
  Administered 2023-12-06 – 2023-12-07 (×4): 2.5 mg via RESPIRATORY_TRACT
  Filled 2023-12-06 (×3): qty 3

## 2023-12-06 MED ORDER — UMECLIDINIUM BROMIDE 62.5 MCG/ACT IN AEPB
1.0000 | INHALATION_SPRAY | Freq: Every day | RESPIRATORY_TRACT | Status: DC
Start: 2023-12-06 — End: 2023-12-07
  Administered 2023-12-07: 1 via RESPIRATORY_TRACT
  Filled 2023-12-06: qty 7

## 2023-12-06 NOTE — ED Notes (Signed)
 ED TO INPATIENT HANDOFF REPORT  Name/Age/Gender Russell Burns 58 y.o. male  Code Status Code Status History     Date Active Date Inactive Code Status Order ID Comments User Context   11/14/2023 2059 11/20/2023 1927 Full Code [id number]  Rometta Emery, MD ED   06/28/2023 1113 07/02/2023 1717 Full Code [id number]  Bobette Mo, MD ED   02/20/2023 0029 02/20/2023 1949 Full Code [id number]  John Giovanni, MD Inpatient   11/26/2022 2139 11/27/2022 1725 Full Code [id number]  Charlsie Quest, MD Inpatient   06/14/2022 1832 06/19/2022 1835 Full Code [id number]  Synetta Fail, MD ED   03/12/2022 1227 03/15/2022 1550 Full Code [id number]  Harrie Foreman, PA-C Inpatient   01/01/2022 2142 01/04/2022 1947 Full Code [id number]  Anselm Jungling, DO ED    Questions for Most Recent Historical Code Status (Order [id number])     Question Answer   By: Consent: discussion documented in EHR            Home/SNF/Other Home  Chief Complaint COPD with acute exacerbation (HCC) [J44.1]  Level of Care/Admitting Diagnosis ED Disposition     ED Disposition  Admit   Condition  --   Comment  Hospital Area: [facility] [[id number]]  Level of Care: Med-Surg [16]  May place patient in observation at [facility] or [facility] if equivalent level of care is available:: Yes  Covid Evaluation: Confirmed COVID Negative  Diagnosis: COPD with acute exacerbation [facility]) [[id number]]  Admitting Physician: Maryln Gottron [[id number]]  Attending Physician: Olexa.Dam, MIR Jaxson.Roy [[id number]]          Medical History Past Medical History:  Diagnosis Date   Allergy    Aortic stenosis    Arthritis    Blood transfusion without reported diagnosis    Cancer (HCC)    Chronic kidney disease    Diabetes mellitus without complication (HCC)    FH: thoracic aneurysm    Hyperlipidemia    Hypertension    Obesity     Allergies Allergies  Allergen Reactions   Bee Venom Anaphylaxis   Cat Dander Itching,  Swelling and Other (See Comments)    Runny nose, itchy/puffy eyes,     IV Location/Drains/Wounds Patient Lines/Drains/Airways Status     Active Line/Drains/Airways     Name Placement date Placement time Site Days   Peripheral IV 12/06/23 20 G 1" Anterior;Proximal;Right Forearm 12/06/23  1122  Forearm  less than 1   Ureteral Drain/Stent Left ureter 6 Fr. 02/12/22  1032  Left ureter  662   Incision - 2 Ports Abdomen Right;Mid Left;Upper 03/12/22  0957  -- 634            Labs/Imaging Results for orders placed or performed during the hospital encounter of 12/06/23 (from the past 48 hours)  CBC with Differential     Status: Abnormal   Collection Time: 12/06/23 11:25 AM  Result Value Ref Range   WBC 9.4 4.0 - 10.5 K/uL   RBC 5.17 4.22 - 5.81 MIL/uL   Hemoglobin 15.9 13.0 - 17.0 g/dL   HCT 16.0 73.7 - 10.6 %   MCV 98.3 80.0 - 100.0 fL   MCH 30.8 26.0 - 34.0 pg   MCHC 31.3 30.0 - 36.0 g/dL   RDW 26.9 48.5 - 46.2 %   Platelets 235 150 - 400 K/uL   nRBC 0.0 0.0 - 0.2 %   Neutrophils Relative % 64 %   Neutro Abs 5.9 1.7 - 7.7 K/uL  Lymphocytes Relative 14 %   Lymphs Abs 1.3 0.7 - 4.0 K/uL   Monocytes Relative 6 %   Monocytes Absolute 0.6 0.1 - 1.0 K/uL   Eosinophils Relative 15 %   Eosinophils Absolute 1.5 (H) 0.0 - 0.5 K/uL   Basophils Relative 1 %   Basophils Absolute 0.1 0.0 - 0.1 K/uL   Immature Granulocytes 0 %   Abs Immature Granulocytes 0.03 0.00 - 0.07 K/uL    Comment: Performed at [facility], [address], [place], [place] [address]  Comprehensive metabolic panel     Status: Abnormal   Collection Time: 12/06/23 11:25 AM  Result Value Ref Range   Sodium 137 135 - 145 mmol/L   Potassium 4.6 3.5 - 5.1 mmol/L   Chloride 103 98 - 111 mmol/L   CO2 23 22 - 32 mmol/L   Glucose, Bld 145 (H) 70 - 99 mg/dL    Comment: Glucose reference range applies only to samples taken after fasting for at least 8 hours.   BUN 14 6 - 20 mg/dL   Creatinine, Ser  4.09 0.61 - 1.24 mg/dL   Calcium 9.5 8.9 - 81.1 mg/dL   Total Protein 8.0 6.5 - 8.1 g/dL   Albumin 4.9 3.5 - 5.0 g/dL   AST 23 15 - 41 U/L   ALT 24 0 - 44 U/L   Alkaline Phosphatase 75 38 - 126 U/L   Total Bilirubin 0.8 0.0 - 1.2 mg/dL   GFR, Estimated >91 >47 mL/min    Comment: (NOTE) Calculated using the CKD-EPI Creatinine Equation (2021)    Anion gap 11 5 - 15    Comment: Performed at [facility], [address], [place], [place] [address]  Troponin I (High Sensitivity)     Status: None   Collection Time: 12/06/23 11:25 AM  Result Value Ref Range   Troponin I (High Sensitivity) 11 <18 ng/L    Comment: (NOTE) Elevated high sensitivity troponin I (hsTnI) values and significant  changes across serial measurements may suggest ACS but many other  chronic and acute conditions are known to elevate hsTnI results.  Refer to the "Links" section for chest pain algorithms and additional  guidance. Performed at [facility], [address], [place], [place] [address]   Blood gas, venous     Status: Abnormal   Collection Time: 12/06/23 11:25 AM  Result Value Ref Range   pH, Ven 7.27 7.25 - 7.43   pCO2, Ven 55 44 - 60 mmHg   pO2, Ven 63 (H) 32 - 45 mmHg   Bicarbonate 25.3 20.0 - 28.0 mmol/L   Acid-base deficit 2.7 (H) 0.0 - 2.0 mmol/L   O2 Saturation 92.5 %   Patient temperature 37.0     Comment: Performed at [facility], [address], [place], [place] [address]  Resp panel by RT-PCR (RSV, Flu A&B, Covid) Anterior Nasal Swab     Status: None   Collection Time: 12/06/23 11:25 AM   Specimen: Anterior Nasal Swab  Result Value Ref Range   SARS Coronavirus 2 by RT PCR NEGATIVE NEGATIVE    Comment: (NOTE) SARS-CoV-2 target nucleic acids are NOT DETECTED.  The SARS-CoV-2 RNA is generally detectable in upper respiratory specimens during the acute phase of infection. The lowest concentration of SARS-CoV-2 viral copies this assay  can detect is 138 copies/mL. A negative result does not preclude SARS-Cov-2 infection and should not be used as the sole basis for treatment or other  patient management decisions. A negative result may occur with  improper specimen collection/handling, submission of specimen other than nasopharyngeal swab, presence of viral mutation(s) within the areas targeted by this assay, and inadequate number of viral copies(<138 copies/mL). A negative result must be combined with clinical observations, patient history, and epidemiological information. The expected result is Negative.  Fact Sheet for Patients:  BloggerCourse.com  Fact Sheet for Healthcare Providers:  SeriousBroker.it  This test is no t yet approved or cleared by the [place] FDA and  has been authorized for detection and/or diagnosis of SARS-CoV-2 by FDA under an Emergency Use Authorization (EUA). This EUA will remain  in effect (meaning this test can be used) for the duration of the COVID-19 declaration under Section 564(b)(1) of the Act, 21 U.S.C.section 360bbb-3(b)(1), unless the authorization is terminated  or revoked sooner.       Influenza A by PCR NEGATIVE NEGATIVE   Influenza B by PCR NEGATIVE NEGATIVE    Comment: (NOTE) The Xpert Xpress SARS-CoV-2/FLU/RSV plus assay is intended as an aid in the diagnosis of influenza from Nasopharyngeal swab specimens and should not be used as a sole basis for treatment. Nasal washings and aspirates are unacceptable for Xpert Xpress SARS-CoV-2/FLU/RSV testing.  Fact Sheet for Patients: BloggerCourse.com  Fact Sheet for Healthcare Providers: SeriousBroker.it  This test is not yet approved or cleared by the [place] FDA and has been authorized for detection and/or diagnosis of SARS-CoV-2 by FDA under an Emergency Use Authorization (EUA). This EUA will remain in effect  (meaning this test can be used) for the duration of the COVID-19 declaration under Section 564(b)(1) of the Act, 21 U.S.C. section 360bbb-3(b)(1), unless the authorization is terminated or revoked.     Resp Syncytial Virus by PCR NEGATIVE NEGATIVE    Comment: (NOTE) Fact Sheet for Patients: BloggerCourse.com  Fact Sheet for Healthcare Providers: SeriousBroker.it  This test is not yet approved or cleared by the [place] FDA and has been authorized for detection and/or diagnosis of SARS-CoV-2 by FDA under an Emergency Use Authorization (EUA). This EUA will remain in effect (meaning this test can be used) for the duration of the COVID-19 declaration under Section 564(b)(1) of the Act, 21 U.S.C. section 360bbb-3(b)(1), unless the authorization is terminated or revoked.  Performed at [facility], [address], [place], [place] [address]    No results found.  Pending Labs Unresulted Labs (From admission, onward)     Start     Ordered   12/06/23 1107  Urinalysis, w/ Reflex to Culture (Infection Suspected) -Urine, Clean Catch  Once,   URGENT       Question:  Specimen Source  Answer:  Urine, Clean Catch   12/06/23 1106            Vitals/Pain Today's Vitals   12/06/23 1230 12/06/23 1245 12/06/23 1300 12/06/23 1315  BP: (!) 156/124 (!) 128/92 (!) 140/92 134/77  Pulse: (!) 111 96 98 95  Resp: 19 (!) 25 (!) 28 13  Temp:      TempSrc:      SpO2: 97% 98% 98% 94%  PainSc:        Isolation Precautions No active isolations  Medications Medications  methylPREDNISolone sodium succinate (SOLU-MEDROL) 125 mg/2 mL injection 125 mg (125 mg Intravenous Given 12/06/23 1127)  ipratropium-albuterol (DUONEB) 0.5-2.5 (3) MG/3ML nebulizer solution 3 mL (3 mLs Nebulization Given 12/06/23 1123)  ipratropium-albuterol (DUONEB) 0.5-2.5 (3) MG/3ML nebulizer solution 3 mL (3 mLs Nebulization Given 12/06/23 1241)  Mobility walks

## 2023-12-06 NOTE — ED Notes (Signed)
 ED TO INPATIENT HANDOFF REPORT  Name/Age/Gender Russell Burns 58 y.o. male  Code Status    Code Status Orders  (From admission, onward)           Start     Ordered   12/06/23 1337  Full code  Continuous       Question:  By:  Answer:  Consent: discussion documented in EHR   12/06/23 1337           Code Status History     Date Active Date Inactive Code Status Order ID Comments User Context   11/14/2023 2059 11/20/2023 1927 Full Code [id number]  Rometta Emery, MD ED   06/28/2023 1113 07/02/2023 1717 Full Code [id number]  Bobette Mo, MD ED   02/20/2023 0029 02/20/2023 1949 Full Code [id number]  John Giovanni, MD Inpatient   11/26/2022 2139 11/27/2022 1725 Full Code [id number]  Charlsie Quest, MD Inpatient   06/14/2022 1832 06/19/2022 1835 Full Code [id number]  Synetta Fail, MD ED   03/12/2022 1227 03/15/2022 1550 Full Code [id number]  [facility] Inpatient   01/01/2022 2142 01/04/2022 1947 Full Code [id number]  Anselm Jungling, DO ED       Home/SNF/Other Home  Chief Complaint COPD with acute exacerbation (HCC) [J44.1] Acute respiratory failure with hypoxia (HCC) [J96.01]  Level of Care/Admitting Diagnosis ED Disposition     ED Disposition  Admit   Condition  --   Comment  Hospital Area: [facility] [[id number]]  Level of Care: Telemetry [5]  Admit to tele based on following criteria: Monitor for Ischemic changes  May place patient in observation at [facility] or [facility] if equivalent level of care is available:: Yes  Covid Evaluation: Confirmed COVID Negative  Diagnosis: Acute respiratory failure with hypoxia [facility]) [[id number]]  Admitting Physician: Maryln Gottron [[id number]]  Attending Physician: Olexa.Dam, MIR Jaxson.Roy [[id number]]          Medical History Past Medical History:  Diagnosis Date   Allergy    Aortic stenosis    Arthritis    Blood transfusion without reported diagnosis    Cancer (HCC)    Chronic kidney disease     Diabetes mellitus without complication (HCC)    FH: thoracic aneurysm    Hyperlipidemia    Hypertension    Obesity     Allergies Allergies  Allergen Reactions   Bee Venom Anaphylaxis   Cat Dander Itching, Swelling and Other (See Comments)    Runny nose, itchy/puffy eyes,     IV Location/Drains/Wounds Patient Lines/Drains/Airways Status     Active Line/Drains/Airways     Name Placement date Placement time Site Days   Peripheral IV 12/06/23 20 G 1" Anterior;Proximal;Right Forearm 12/06/23  1122  Forearm  less than 1   Ureteral Drain/Stent Left ureter 6 Fr. 02/12/22  1032  Left ureter  662   Incision - 2 Ports Abdomen Right;Mid Left;Upper 03/12/22  0957  -- 634            Labs/Imaging Results for orders placed or performed during the hospital encounter of 12/06/23 (from the past 48 hours)  CBC with Differential     Status: Abnormal   Collection Time: 12/06/23 11:25 AM  Result Value Ref Range   WBC 9.4 4.0 - 10.5 K/uL   RBC 5.17 4.22 - 5.81 MIL/uL   Hemoglobin 15.9 13.0 - 17.0 g/dL   HCT 84.1 66.0 - 63.0 %   MCV 98.3 80.0 - 100.0 fL  MCH 30.8 26.0 - 34.0 pg   MCHC 31.3 30.0 - 36.0 g/dL   RDW 16.1 09.6 - 04.5 %   Platelets 235 150 - 400 K/uL   nRBC 0.0 0.0 - 0.2 %   Neutrophils Relative % 64 %   Neutro Abs 5.9 1.7 - 7.7 K/uL   Lymphocytes Relative 14 %   Lymphs Abs 1.3 0.7 - 4.0 K/uL   Monocytes Relative 6 %   Monocytes Absolute 0.6 0.1 - 1.0 K/uL   Eosinophils Relative 15 %   Eosinophils Absolute 1.5 (H) 0.0 - 0.5 K/uL   Basophils Relative 1 %   Basophils Absolute 0.1 0.0 - 0.1 K/uL   Immature Granulocytes 0 %   Abs Immature Granulocytes 0.03 0.00 - 0.07 K/uL    Comment: Performed at [facility], [address], [place], [place] [address]  Comprehensive metabolic panel     Status: Abnormal   Collection Time: 12/06/23 11:25 AM  Result Value Ref Range   Sodium 137 135 - 145 mmol/L   Potassium 4.6 3.5 - 5.1 mmol/L   Chloride 103 98 -  111 mmol/L   CO2 23 22 - 32 mmol/L   Glucose, Bld 145 (H) 70 - 99 mg/dL    Comment: Glucose reference range applies only to samples taken after fasting for at least 8 hours.   BUN 14 6 - 20 mg/dL   Creatinine, Ser 1.91 0.61 - 1.24 mg/dL   Calcium 9.5 8.9 - 47.8 mg/dL   Total Protein 8.0 6.5 - 8.1 g/dL   Albumin 4.9 3.5 - 5.0 g/dL   AST 23 15 - 41 U/L   ALT 24 0 - 44 U/L   Alkaline Phosphatase 75 38 - 126 U/L   Total Bilirubin 0.8 0.0 - 1.2 mg/dL   GFR, Estimated >29 >56 mL/min    Comment: (NOTE) Calculated using the CKD-EPI Creatinine Equation (2021)    Anion gap 11 5 - 15    Comment: Performed at [facility], [address], [place], [place] [address]  Troponin I (High Sensitivity)     Status: None   Collection Time: 12/06/23 11:25 AM  Result Value Ref Range   Troponin I (High Sensitivity) 11 <18 ng/L    Comment: (NOTE) Elevated high sensitivity troponin I (hsTnI) values and significant  changes across serial measurements may suggest ACS but many other  chronic and acute conditions are known to elevate hsTnI results.  Refer to the "Links" section for chest pain algorithms and additional  guidance. Performed at [facility], [address] [place], [place] [address]   Blood gas, venous     Status: Abnormal   Collection Time: 12/06/23 11:25 AM  Result Value Ref Range   pH, Ven 7.27 7.25 - 7.43   pCO2, Ven 55 44 - 60 mmHg   pO2, Ven 63 (H) 32 - 45 mmHg   Bicarbonate 25.3 20.0 - 28.0 mmol/L   Acid-base deficit 2.7 (H) 0.0 - 2.0 mmol/L   O2 Saturation 92.5 %   Patient temperature 37.0     Comment: Performed at [facility], [address] [place], [place] [address]  Resp panel by RT-PCR (RSV, Flu A&B, Covid) Anterior Nasal Swab     Status: None   Collection Time: 12/06/23 11:25 AM   Specimen: Anterior Nasal Swab  Result Value Ref Range   SARS Coronavirus 2 by RT PCR NEGATIVE NEGATIVE    Comment: (NOTE) SARS-CoV-2  target nucleic  acids are NOT DETECTED.  The SARS-CoV-2 RNA is generally detectable in upper respiratory specimens during the acute phase of infection. The lowest concentration of SARS-CoV-2 viral copies this assay can detect is 138 copies/mL. A negative result does not preclude SARS-Cov-2 infection and should not be used as the sole basis for treatment or other patient management decisions. A negative result may occur with  improper specimen collection/handling, submission of specimen other than nasopharyngeal swab, presence of viral mutation(s) within the areas targeted by this assay, and inadequate number of viral copies(<138 copies/mL). A negative result must be combined with clinical observations, patient history, and epidemiological information. The expected result is Negative.  Fact Sheet for Patients:  BloggerCourse.com  Fact Sheet for Healthcare Providers:  SeriousBroker.it  This test is no t yet approved or cleared by the [place] FDA and  has been authorized for detection and/or diagnosis of SARS-CoV-2 by FDA under an Emergency Use Authorization (EUA). This EUA will remain  in effect (meaning this test can be used) for the duration of the COVID-19 declaration under Section 564(b)(1) of the Act, 21 U.S.C.section 360bbb-3(b)(1), unless the authorization is terminated  or revoked sooner.       Influenza A by PCR NEGATIVE NEGATIVE   Influenza B by PCR NEGATIVE NEGATIVE    Comment: (NOTE) The Xpert Xpress SARS-CoV-2/FLU/RSV plus assay is intended as an aid in the diagnosis of influenza from Nasopharyngeal swab specimens and should not be used as a sole basis for treatment. Nasal washings and aspirates are unacceptable for Xpert Xpress SARS-CoV-2/FLU/RSV testing.  Fact Sheet for Patients: BloggerCourse.com  Fact Sheet for Healthcare Providers: SeriousBroker.it  This  test is not yet approved or cleared by the [place] FDA and has been authorized for detection and/or diagnosis of SARS-CoV-2 by FDA under an Emergency Use Authorization (EUA). This EUA will remain in effect (meaning this test can be used) for the duration of the COVID-19 declaration under Section 564(b)(1) of the Act, 21 U.S.C. section 360bbb-3(b)(1), unless the authorization is terminated or revoked.     Resp Syncytial Virus by PCR NEGATIVE NEGATIVE    Comment: (NOTE) Fact Sheet for Patients: BloggerCourse.com  Fact Sheet for Healthcare Providers: SeriousBroker.it  This test is not yet approved or cleared by the [place] FDA and has been authorized for detection and/or diagnosis of SARS-CoV-2 by FDA under an Emergency Use Authorization (EUA). This EUA will remain in effect (meaning this test can be used) for the duration of the COVID-19 declaration under Section 564(b)(1) of the Act, 21 U.S.C. section 360bbb-3(b)(1), unless the authorization is terminated or revoked.  Performed at [facility], [address] [place], [place] [address]    DG Chest Port 1 View Result Date: 12/06/2023 CLINICAL DATA:  Shortness of breath EXAM: PORTABLE CHEST 1 VIEW COMPARISON:  11/14/2023 FINDINGS: Heart size upper limits of normal. Chronic aortic atherosclerosis. The lungs are clear. No edema, infiltrate, collapse or effusion. IMPRESSION: No active disease. Aortic atherosclerosis. Electronically Signed   By: Paulina Fusi M.D.   On: 12/06/2023 13:35    Pending Labs Unresulted Labs (From admission, onward)     Start     Ordered   12/07/23 0500  Basic metabolic panel  Tomorrow morning,   R        12/06/23 1337   12/07/23 0500  CBC  Tomorrow morning,   R        12/06/23 1337   12/06/23 1335  Brain natriuretic peptide  Add-on,  AD        12/06/23 1334   12/06/23 1107  Urinalysis, w/ Reflex to Culture (Infection  Suspected) -Urine, Clean Catch  Once,   URGENT       Question:  Specimen Source  Answer:  Urine, Clean Catch   12/06/23 1106            Vitals/Pain Today's Vitals   12/06/23 1345 12/06/23 1400 12/06/23 1415 12/06/23 1430  BP: (!) 145/86 (!) 141/87 139/88 134/84  Pulse: (!) 103 94 92 95  Resp: (!) 37 (!) 22 15 (!) 23  Temp:      TempSrc:      SpO2: 94% 93% 96% 98%  PainSc:        Isolation Precautions No active isolations  Medications Medications  levothyroxine (SYNTHROID) tablet 25 mcg (has no administration in time range)  famotidine (PEPCID) tablet 20 mg (has no administration in time range)  tamsulosin (FLOMAX) capsule 0.4 mg (has no administration in time range)  benzonatate (TESSALON) capsule 200 mg (has no administration in time range)  budeson-glycopyrrolate-formoterol 160-9-4.8 MCG/ACT AERO 2 puff (has no administration in time range)  loratadine (CLARITIN) tablet 10 mg (has no administration in time range)  fluticasone (FLONASE) 50 MCG/ACT nasal spray 2 spray (has no administration in time range)  enoxaparin (LOVENOX) injection 40 mg (has no administration in time range)  insulin aspart (novoLOG) injection 0-15 Units (has no administration in time range)  insulin aspart (novoLOG) injection 0-5 Units (has no administration in time range)  acetaminophen (TYLENOL) tablet 650 mg (has no administration in time range)    Or  acetaminophen (TYLENOL) suppository 650 mg (has no administration in time range)  traZODone (DESYREL) tablet 25 mg (has no administration in time range)  ondansetron (ZOFRAN) tablet 4 mg (has no administration in time range)    Or  ondansetron (ZOFRAN) injection 4 mg (has no administration in time range)  albuterol (PROVENTIL) (2.5 MG/3ML) 0.083% nebulizer solution 2.5 mg (has no administration in time range)  predniSONE (DELTASONE) tablet 40 mg (has no administration in time range)  methylPREDNISolone sodium succinate (SOLU-MEDROL) 125 mg/2 mL  injection 125 mg (125 mg Intravenous Given 12/06/23 1127)  ipratropium-albuterol (DUONEB) 0.5-2.5 (3) MG/3ML nebulizer solution 3 mL (3 mLs Nebulization Given 12/06/23 1123)  ipratropium-albuterol (DUONEB) 0.5-2.5 (3) MG/3ML nebulizer solution 3 mL (3 mLs Nebulization Given 12/06/23 1241)    Mobility walks

## 2023-12-06 NOTE — ED Provider Notes (Signed)
 [facility] AT [facility] Provider Note   CSN: [id number] Arrival date & time: 12/06/23  1058     History  Chief Complaint  Patient presents with   Shortness of Breath    Russell Burns is a 58 y.o. male.  58 year old male with prior medical history as detailed below presents for evaluation.  Patient is on 2 L nasal cannula O2 at all times.  He reports increased shortness of breath x 2 days.  He reports increased wheezing.  He reports at home nebulizer treatments are not effective.  He has increased his O2 up to 4 to 5 L with some improvement in his dyspnea.  He denies fever.  He denies chest pain.    The history is provided by the patient and medical records.       Home Medications Prior to Admission medications   Medication Sig Start Date End Date Taking? Authorizing Provider  acetaminophen (TYLENOL) 500 MG tablet Take 1,000 mg by mouth in the morning.    [provider]  albuterol (PROVENTIL) (2.5 MG/3ML) 0.083% nebulizer solution Take 3 mLs (2.5 mg total) by nebulization every 4 (four) hours as needed for wheezing or shortness of breath. 11/17/23   Azucena Fallen, MD  albuterol (VENTOLIN HFA) 108 (90 Base) MCG/ACT inhaler Inhale 2 puffs into the lungs every 6 (six) hours as needed for wheezing or shortness of breath. 12/11/21   [provider]  amLODipine (NORVASC) 10 MG tablet Take 10 mg by mouth daily. 08/01/20   [provider]  atorvastatin (LIPITOR) 80 MG tablet Take 80 mg by mouth daily.    [provider]  BENADRYL ALLERGY 25 MG tablet Take 50 mg by mouth 3 (three) times daily.    [provider]  benzonatate (TESSALON) 200 MG capsule Take 1 capsule (200 mg total) by mouth 3 (three) times daily as needed. Patient taking differently: Take 200 mg by mouth 3 (three) times daily as needed for cough. 07/02/23   Kathlen Mody, MD  Budeson-Glycopyrrol-Formoterol (BREZTRI AEROSPHERE) 160-9-4.8  MCG/ACT AERO Inhale 2 puffs into the lungs 2 (two) times daily. Take 2 puffs first thing in am and then another 2 puffs about 12 hours later. 12/12/22   Nyoka Cowden, MD  cetirizine (ZYRTEC) 10 MG tablet Take 10 mg by mouth 2 (two) times daily.    [provider]  DELSYM 30 MG/5ML liquid Take 15-30 mg by mouth in the morning and at bedtime.    [provider]  EPINEPHrine 0.3 mg/0.3 mL IJ SOAJ injection Inject 0.3 mg into the muscle as needed for anaphylaxis.    [provider]  ezetimibe (ZETIA) 10 MG tablet Take 10 mg by mouth daily. 08/01/20   [provider]  famotidine (PEPCID) 20 MG tablet TAKE 1 TABLET BY MOUTH EVERY DAY AFTER SUPPER 11/28/23   Nyoka Cowden, MD  fluticasone [facility]) 50 MCG/ACT nasal spray Place 2 sprays into both nostrils in the morning and at bedtime.    [provider]  HYDROcodone bit-homatropine (HYCODAN) 5-1.5 MG/5ML syrup Take 5 mLs by mouth every 6 (six) hours as needed for cough. 11/17/23   Azucena Fallen, MD  ipratropium-albuterol (DUONEB) 0.5-2.5 (3) MG/3ML SOLN Take 3 mLs by nebulization every 3 (three) hours as needed (shortness of breath or wheezing). 11/22/22   [provider]  JARDIANCE 25 MG TABS tablet Take 25 mg by mouth daily. 07/10/20   [provider]  levothyroxine (SYNTHROID) 25  MCG tablet Take 1 tablet (25 mcg total) by mouth daily before breakfast. 12/07/22   Heilingoetter, Cassandra L, PA-C  losartan (COZAAR) 100 MG tablet Take 100 mg by mouth daily.    [provider]  metFORMIN (GLUCOPHAGE) 1000 MG tablet Take 1,000 mg by mouth in the morning and at bedtime.    [provider]  nystatin (MYCOSTATIN) 100000 UNIT/ML suspension Take 5 mLs by mouth 4 (four) times daily as needed (as directed for thrush).    [provider]  [facility] VERIO test strip 1 each by Other route 5 (five) times daily. 10/31/22   [provider]  prochlorperazine (COMPAZINE) 10  MG tablet Take 1 tablet (10 mg total) by mouth every 6 (six) hours as needed. Patient taking differently: Take 10 mg by mouth every 6 (six) hours as needed for nausea or vomiting. 12/27/22   Si Gaul, MD  REPATHA 140 MG/ML SOSY Inject 140 mg into the skin every 14 (fourteen) days.    [provider]  tamsulosin (FLOMAX) 0.4 MG CAPS capsule Take 1 capsule (0.4 mg total) by mouth daily. 02/12/22   Crista Elliot, MD  tirzepatide [facility]) 10 MG/0.5ML Pen Inject 10 mg into the skin once a week. Patient taking differently: Inject 10 mg into the skin every Friday. 11/14/21         Allergies    Bee venom and Cat dander    Review of Systems   Review of Systems  All other systems reviewed and are negative.   Physical Exam Updated Vital Signs BP (!) 173/112 (BP Location: Right Arm)   Pulse (!) 111   Temp 98.2 F (36.8 C) (Oral)   Resp (!) 24   SpO2 94%  Physical Exam Vitals and nursing note reviewed.  Constitutional:      General: He is not in acute distress.    Appearance: Normal appearance. He is well-developed.  HENT:     Head: Normocephalic and atraumatic.  Eyes:     Conjunctiva/sclera: Conjunctivae normal.     Pupils: Pupils are equal, round, and reactive to light.  Cardiovascular:     Rate and Rhythm: Normal rate and regular rhythm.     Heart sounds: Normal heart sounds.  Pulmonary:     Effort: Pulmonary effort is normal. No respiratory distress.     Comments: Diffuse expiratory wheezes in all lung fields. Abdominal:     General: There is no distension.     Palpations: Abdomen is soft.     Tenderness: There is no abdominal tenderness.  Musculoskeletal:        General: No deformity. Normal range of motion.     Cervical back: Normal range of motion and neck supple.  Skin:    General: Skin is warm and dry.  Neurological:     General: No focal deficit present.     Mental Status: He is alert and oriented to person, place, and time.     ED Results /  Procedures / Treatments   Labs (all labs ordered are listed, but only abnormal results are displayed) Labs Reviewed  RESP PANEL BY RT-PCR (RSV, FLU A&B, COVID)  [id number]  CBC WITH DIFFERENTIAL/PLATELET  COMPREHENSIVE METABOLIC PANEL  BLOOD GAS, VENOUS  URINALYSIS, W/ REFLEX TO CULTURE (INFECTION SUSPECTED)  TROPONIN I (HIGH SENSITIVITY)    EKG None  Radiology No results found.  Procedures Procedures    Medications Ordered in ED Medications  methylPREDNISolone sodium succinate (SOLU-MEDROL) 125 mg/2 mL injection 125 mg (has  no administration in time range)  ipratropium-albuterol (DUONEB) 0.5-2.5 (3) MG/3ML nebulizer solution 3 mL (has no administration in time range)    ED Course/ Medical Decision Making/ A&P                                 Medical Decision Making Amount and/or Complexity of Data Reviewed Labs: ordered. Radiology: ordered.  Risk Prescription drug management. Decision regarding hospitalization.    Medical Screen Complete  This patient presented to the ED with complaint of wheezing, shortness of breath.  This complaint involves an extensive number of treatment options. The initial differential diagnosis includes, but is not limited to, COPD exacerbation, metabolic abnormality, pneumonia, viral infection, etc.  This presentation is: Acute, Chronic, Self-Limited, Previously Undiagnosed, Uncertain Prognosis, Complicated, Systemic Symptoms, and Threat to Life/Bodily Function  Patient with known history of chronic bronchitis presents with increased shortness of breath.  Patient reports chronic use of O2 at 2 L nasal cannula.  Patient with increased cough and wheezing x 2 days.  He reports increasing his O2 up to 5 L at home without significant troponin symptoms.  Patient with diffuse wheezing initial evaluation.  Patient with higher FiO2 requirement than his baseline.  With breathing treatments and supplemental O2 the patient feels improved.  Patient  would benefit from admission for further workup and treatment.  Hospitalist service made aware of case and evaluate for admission.  Co morbidities that complicated the patient's evaluation  See HPI   Additional history obtained:  External records from outside sources obtained and reviewed including prior ED visits and prior Inpatient records.    Lab Tests:  I ordered and personally interpreted labs.  The pertinent results include: CBC, CMP, VBG, COVID, flu, troponin,   Imaging Studies ordered:  I ordered imaging studies including chest x-ray I independently visualized and interpreted obtained imaging which showed NAD I agree with the radiologist interpretation.   Cardiac Monitoring:  The patient was maintained on a cardiac monitor.  I personally viewed and interpreted the cardiac monitor which showed an underlying rhythm of: Sinus tach   Medicines ordered:  I ordered medication including nebulized treatments, Solu-Medrol for bronchospasm Reevaluation of the patient after these medicines showed that the patient: improved     Problem List / ED Course:  Shortness of breath, bronchospasm   Reevaluation:  After the interventions noted above, I reevaluated the patient and found that they have: improved Disposition:  After consideration of the diagnostic results and the patients response to treatment, I feel that the patent would benefit from admission.          Final Clinical Impression(s) / ED Diagnoses Final diagnoses:  SOB (shortness of breath)    Rx / DC Orders ED Discharge Orders     None         Wynetta Fines, MD 12/06/23 1240

## 2023-12-06 NOTE — ED Triage Notes (Signed)
 Pt presents with c/o shortness of breath. Pt has a hx of some breathing issues, currently on oxygen at home. Pt with increased work of breathing today, 86% on his O2 upon arrival. Pt with obvious difficulty breathing upon arrival.

## 2023-12-06 NOTE — H&P (Signed)
 History and Physical  Russell Burns WUJ:[id number] DOB: 07-23-1966 DOA: 12/06/2023  PCP: [name], MD   Chief Complaint: Shortness of breath  HPI: Russell Burns is a 58 y.o. male with medical history significant for type 2 diabetes, asthmatic bronchitis, severe aortic stenosis, occupational lung disease being admitted to the hospital with hypoxemic respiratory failure likely due to asthma exacerbation as well as hypoxemia from severe aortic stenosis.  Patient was hospitalized 2/20 to 11/20/23 with acute hypoxemic respiratory failure, felt to be due to asthmatic bronchitis versus COPD exacerbation.  During that hospital stay he was found to have severe progression of his aortic stenosis which is now severe.  He underwent left and right heart catheterization, was seen by cardiology with plans for outpatient CT surgery evaluation for likely aortic valve replacement or TAVR.  He has this consultation with CT surgery next week.  In the last couple of days, he has had increasing shortness of breath with wheezing, nonproductive cough, and chest tightness.  He does have some orthopnea, but he denies any chest pain or lower extremity edema.  He was treated in the emergency department with multiple DuoNeb breathing treatments, as well as IV steroids says that his breathing feels much better at this time.  Review of Systems: Please see HPI for pertinent positives and negatives. A complete 10 system review of systems are otherwise negative.  Past Medical History:  Diagnosis Date   Allergy    Aortic stenosis    Arthritis    Blood transfusion without reported diagnosis    Cancer (HCC)    Chronic kidney disease    Diabetes mellitus without complication (HCC)    FH: thoracic aneurysm    Hyperlipidemia    Hypertension    Obesity    Past Surgical History:  Procedure Laterality Date   CYSTOSCOPY WITH URETEROSCOPY AND STENT PLACEMENT Left 02/12/2022   Procedure: CYSTOSCOPY WITH LEFT RETROGRADE  PYELOGRAM, DIAGNOSTIC URETEROSCOPY AND STENT PLACEMENT;  Surgeon: Crista Elliot, MD;  Location: [facility];  Service: Urology;  Laterality: Left;   LAPAROSCOPIC NEPHRECTOMY, HAND ASSISTED Left 03/12/2022   Procedure: LEFT HAND ASSISTED LAPAROSCOPIC NEPHRECTOMY;  Surgeon: Crista Elliot, MD;  Location: [facility];  Service: Urology;  Laterality: Left;  NEED 2.5 HRS FOR THIS CASE   RIGHT HEART CATH AND CORONARY ANGIOGRAPHY N/A 11/19/2023   Procedure: RIGHT HEART CATH AND CORONARY ANGIOGRAPHY;  Surgeon: Romie Minus, MD;  Location: [facility] INVASIVE CV LAB;  Service: Cardiovascular;  Laterality: N/A;   TONSILLECTOMY     WISDOM TOOTH EXTRACTION     Social History:  reports that he has never smoked. His smokeless tobacco use includes chew. He reports current alcohol use. He reports that he does not use drugs.  Allergies  Allergen Reactions   Bee Venom Anaphylaxis   Cat Dander Itching, Swelling and Other (See Comments)    Runny nose, itchy/puffy eyes,     Family History  Problem Relation Age of Onset   CAD Other    Diabetes Mellitus II Other    Hypertension Other    Heart failure Other    Hypertension Mother    Heart attack Father        31, 47 at both ages   Diabetes Mellitus II Father        insulin dependent   Aneurysm Maternal Uncle        aortic   Heart attack Paternal Uncle 43       minor   Hypertension  Paternal Uncle    Heart attack Maternal Grandmother 54       massive   Aneurysm Maternal Grandfather        abdominal   Heart attack Paternal Grandfather 48   Aneurysm Cousin 44       aortic   Colon polyps Neg Hx    Esophageal cancer Neg Hx    Rectal cancer Neg Hx    Stomach cancer Neg Hx      Prior to Admission medications   Medication Sig Start Date End Date Taking? Authorizing Provider  acetaminophen (TYLENOL) 500 MG tablet Take 1,000 mg by mouth in the morning.    [provider]  albuterol (PROVENTIL) (2.5 MG/3ML) 0.083% nebulizer solution Take 3 mLs (2.5  mg total) by nebulization every 4 (four) hours as needed for wheezing or shortness of breath. 11/17/23   Azucena Fallen, MD  albuterol (VENTOLIN HFA) 108 (90 Base) MCG/ACT inhaler Inhale 2 puffs into the lungs every 6 (six) hours as needed for wheezing or shortness of breath. 12/11/21   [provider]  amLODipine (NORVASC) 10 MG tablet Take 10 mg by mouth daily. 08/01/20   [provider]  atorvastatin (LIPITOR) 80 MG tablet Take 80 mg by mouth daily.    [provider]  BENADRYL ALLERGY 25 MG tablet Take 50 mg by mouth 3 (three) times daily.    [provider]  benzonatate (TESSALON) 200 MG capsule Take 1 capsule (200 mg total) by mouth 3 (three) times daily as needed. Patient taking differently: Take 200 mg by mouth 3 (three) times daily as needed for cough. 07/02/23   Kathlen Mody, MD  Budeson-Glycopyrrol-Formoterol (BREZTRI AEROSPHERE) 160-9-4.8 MCG/ACT AERO Inhale 2 puffs into the lungs 2 (two) times daily. Take 2 puffs first thing in am and then another 2 puffs about 12 hours later. 12/12/22   Nyoka Cowden, MD  cetirizine (ZYRTEC) 10 MG tablet Take 10 mg by mouth 2 (two) times daily.    [provider]  DELSYM 30 MG/5ML liquid Take 15-30 mg by mouth in the morning and at bedtime.    [provider]  EPINEPHrine 0.3 mg/0.3 mL IJ SOAJ injection Inject 0.3 mg into the muscle as needed for anaphylaxis.    [provider]  ezetimibe (ZETIA) 10 MG tablet Take 10 mg by mouth daily. 08/01/20   [provider]  famotidine (PEPCID) 20 MG tablet TAKE 1 TABLET BY MOUTH EVERY DAY AFTER SUPPER 11/28/23   Nyoka Cowden, MD  fluticasone [facility]) 50 MCG/ACT nasal spray Place 2 sprays into both nostrils in the morning and at bedtime.    [provider]  HYDROcodone bit-homatropine (HYCODAN) 5-1.5 MG/5ML syrup Take 5 mLs by mouth every 6 (six) hours as needed for cough. 11/17/23   Azucena Fallen, MD  ipratropium-albuterol  (DUONEB) 0.5-2.5 (3) MG/3ML SOLN Take 3 mLs by nebulization every 3 (three) hours as needed (shortness of breath or wheezing). 11/22/22   [provider]  JARDIANCE 25 MG TABS tablet Take 25 mg by mouth daily. 07/10/20   [provider]  levothyroxine (SYNTHROID) 25 MCG tablet Take 1 tablet (25 mcg total) by mouth daily before breakfast. 12/07/22   Heilingoetter, Cassandra L, PA-C  losartan (COZAAR) 100 MG tablet Take 100 mg by mouth daily.    [provider]  metFORMIN (GLUCOPHAGE) 1000 MG tablet Take 1,000 mg by mouth in the morning and at bedtime.    [provider]  nystatin (MYCOSTATIN) 100000  UNIT/ML suspension Take 5 mLs by mouth 4 (four) times daily as needed (as directed for thrush).    [provider]  [facility] VERIO test strip 1 each by Other route 5 (five) times daily. 10/31/22   [provider]  prochlorperazine (COMPAZINE) 10 MG tablet Take 1 tablet (10 mg total) by mouth every 6 (six) hours as needed. Patient taking differently: Take 10 mg by mouth every 6 (six) hours as needed for nausea or vomiting. 12/27/22   Si Gaul, MD  REPATHA 140 MG/ML SOSY Inject 140 mg into the skin every 14 (fourteen) days.    [provider]  tamsulosin (FLOMAX) 0.4 MG CAPS capsule Take 1 capsule (0.4 mg total) by mouth daily. 02/12/22   Crista Elliot, MD  tirzepatide [facility]) 10 MG/0.5ML Pen Inject 10 mg into the skin once a week. Patient taking differently: Inject 10 mg into the skin every Friday. 11/14/21       Physical Exam: BP 134/77   Pulse 95   Temp 98.2 F (36.8 C) (Oral)   Resp 13   SpO2 94%  General:  Alert, oriented x4, calm, in no acute distress, currently resting comfortably on 3 L nasal cannula oxygen.  His wife is at the bedside. Cardiovascular: RRR, systolic murmur, no peripheral edema  Respiratory: Breath sounds are distant but with good bilateral equal air movement, without rhonchi but with some mild end  expiratory wheezing globally.  He is not tachypneic, not in any respiratory distress Abdomen: soft, nontender, nondistended, normal bowel tones heard  Skin: dry, no rashes  Musculoskeletal: no joint effusions, normal range of motion  Psychiatric: appropriate affect, normal speech  Neurologic: extraocular muscles intact, clear speech, moving all extremities with intact sensorium         Labs on Admission:  Basic Metabolic Panel: Recent Labs  Lab 12/06/23 1125  NA 137  K 4.6  CL 103  CO2 23  GLUCOSE 145*  BUN 14  CREATININE 1.07  CALCIUM 9.5   Liver Function Tests: Recent Labs  Lab 12/06/23 1125  AST 23  ALT 24  ALKPHOS 75  BILITOT 0.8  PROT 8.0  ALBUMIN 4.9   No results for input(s): "LIPASE", "AMYLASE" in the last 168 hours. No results for input(s): "AMMONIA" in the last 168 hours. CBC: Recent Labs  Lab 12/06/23 1125  WBC 9.4  NEUTROABS 5.9  HGB 15.9  HCT 50.8  MCV 98.3  PLT 235   Cardiac Enzymes: No results for input(s): "CKTOTAL", "CKMB", "CKMBINDEX", "TROPONINI" in the last 168 hours. BNP (last 3 results) Recent Labs    02/19/23 2027 06/28/23 0902 11/14/23 1635  BNP 17.8 53.6 30.6    ProBNP (last 3 results) No results for input(s): "PROBNP" in the last 8760 hours.  CBG: No results for input(s): "GLUCAP" in the last 168 hours.  Radiological Exams on Admission: DG Chest Port 1 View Result Date: 12/06/2023 CLINICAL DATA:  Shortness of breath EXAM: PORTABLE CHEST 1 VIEW COMPARISON:  11/14/2023 FINDINGS: Heart size upper limits of normal. Chronic aortic atherosclerosis. The lungs are clear. No edema, infiltrate, collapse or effusion. IMPRESSION: No active disease. Aortic atherosclerosis. Electronically Signed   By: Paulina Fusi M.D.   On: 12/06/2023 13:35   Assessment/Plan Russell Burns is a 58 y.o. male with medical history significant for type 2 diabetes, asthmatic bronchitis, severe aortic stenosis, occupational lung disease being admitted to  the hospital with hypoxemic respiratory failure likely due to asthma exacerbation as well as hypoxemia from  severe aortic stenosis.   Acute hypoxic respiratory failure-I feel this is likely due to combination of known previous hypoxemia from his severe arctic stenosis as well as exacerbation of his asthma/COPD.  I do not think he is experiencing significant worsening symptoms due to his aortic stenosis, as he has no chest pain, no syncope, no peripheral edema.  His troponin is negative and he feels that his breathing is much improved with the above-mentioned interventions. -Observation admission -Continue steroids -Continue scheduled breathing treatments, with as needed albuterol  Severe aortic stenosis-this is a known issue for him which was recently diagnosed last month.  He has outpatient CT surgery evaluation scheduled next week.  Currently this appears stable without acute complication.  Type 2 diabetes-hold home Jardiance and metformin, carb modified diet with moderate dose sliding scale insulin  BPH-Flomax  Hypothyroidism-Synthroid  GERD-Pepcid  DVT prophylaxis: Lovenox     Code Status: Full Code  Consults called: None  Admission status: Observation  Time spent: 55 minutes  Ariel Dimitri Sharlette Dense MD [facility] Pager [phone]  If 7PM-7AM, please contact night-coverage [email or web address] Password [facility]  12/06/2023, 1:39 PM

## 2023-12-07 ENCOUNTER — Encounter: Payer: Self-pay | Admitting: Internal Medicine

## 2023-12-07 ENCOUNTER — Other Ambulatory Visit: Payer: Self-pay

## 2023-12-07 DIAGNOSIS — J441 Chronic obstructive pulmonary disease with (acute) exacerbation: Secondary | ICD-10-CM | POA: Diagnosis not present

## 2023-12-07 DIAGNOSIS — J9621 Acute and chronic respiratory failure with hypoxia: Secondary | ICD-10-CM

## 2023-12-07 DIAGNOSIS — I350 Nonrheumatic aortic (valve) stenosis: Secondary | ICD-10-CM | POA: Diagnosis not present

## 2023-12-07 LAB — CBC
HCT: 46.6 % (ref 39.0–52.0)
Hemoglobin: 15.3 g/dL (ref 13.0–17.0)
MCH: 31.4 pg (ref 26.0–34.0)
MCHC: 32.8 g/dL (ref 30.0–36.0)
MCV: 95.5 fL (ref 80.0–100.0)
Platelets: 236 10*3/uL (ref 150–400)
RBC: 4.88 MIL/uL (ref 4.22–5.81)
RDW: 12.2 % (ref 11.5–15.5)
WBC: 8.4 10*3/uL (ref 4.0–10.5)
nRBC: 0 % (ref 0.0–0.2)

## 2023-12-07 LAB — GLUCOSE, CAPILLARY: Glucose-Capillary: 236 mg/dL — ABNORMAL HIGH (ref 70–99)

## 2023-12-07 MED ORDER — GUAIFENESIN ER 600 MG PO TB12: 1200.0000 mg | ORAL_TABLET | Freq: Two times a day (BID) | ORAL | Status: DC

## 2023-12-07 MED ORDER — ALBUTEROL SULFATE (2.5 MG/3ML) 0.083% IN NEBU: 2.5000 mg | INHALATION_SOLUTION | RESPIRATORY_TRACT | 5 refills | Status: DC | PRN

## 2023-12-07 MED ORDER — PREDNISONE 10 MG PO TABS: ORAL_TABLET | ORAL | 0 refills | Status: DC

## 2023-12-07 NOTE — Progress Notes (Signed)
   12/07/23 1112  TOC Brief Assessment  Insurance and Status Reviewed  Patient has primary care physician Yes  Home environment has been reviewed Resides in single family home with spouse  Prior level of function: Independent with ADLs at baseline  Prior/Current Home Services No current home services  Social Drivers of Health Review SDOH reviewed no interventions necessary  Readmission risk has been reviewed Yes  Transition of care needs no transition of care needs at this time

## 2023-12-07 NOTE — Discharge Summary (Signed)
 Physician Discharge Summary   Russell Burns UJW:[id number] DOB: Jun 03, 1966 DOA: 12/06/2023  PCP: [name], MD  Admit date: 12/06/2023 Discharge date: 12/07/2023   Admitted From: Home  Disposition:  Home Discharging physician: Lewie Chamber, MD Barriers to discharge: none  Recommendations at discharge: Follow up with CT surgery Establish with pulmonology (changing practices)   Discharge Condition: stable CODE STATUS: Full  Diet recommendation:  Diet Orders (From admission, onward)     Start     Ordered   12/07/23 0000  Diet Carb Modified        12/07/23 0907   12/06/23 1338  Diet Carb Modified Fluid consistency: Thin; Room service appropriate? Yes  Diet effective now       Question Answer Comment  Diet-HS Snack? Nothing   Calorie Level Medium 1600-2000   Fluid consistency: Thin   Room service appropriate? Yes      12/06/23 1337            Hospital Course: Russell Burns is a 58 yo male with PMH severe aortic stenosis, asthmatic bronchitis, COPD, occupational lung disease, DM II who presented with shortness of breath.   Patient was hospitalized 2/20 to 11/20/23 with acute hypoxemic respiratory failure, felt to be due to asthmatic bronchitis versus COPD exacerbation.  During that hospital stay he was found to have severe progression of his aortic stenosis which is now severe.  He underwent left and right heart catheterization, was seen by cardiology with plans for outpatient CT surgery evaluation for likely aortic valve replacement or TAVR.  He has this consultation with CT surgery next week.    In the last couple of days, he has had increasing shortness of breath with wheezing, nonproductive cough, and chest tightness.  He does have some orthopnea, but he denies any chest pain or lower extremity edema.  He was treated in the emergency department with multiple DuoNeb breathing treatments, as well as IV steroids.  SOB/dyspnea improved after steroid treatment.  He is  chronically on 2 L oxygen at home and was stable around 4 L at discharge.  He was prescribed prednisone taper at discharge and will continue outpatient follow-ups with pulmonology and upcoming CT surgery.    The patient's acute and chronic medical conditions were treated accordingly. On day of discharge, patient was felt deemed stable for discharge. Patient/family member advised to call PCP or come back to ER if needed.   Principal Diagnosis: COPD with acute exacerbation [facility])  Discharge Diagnoses: Active Hospital Problems   Diagnosis Date Noted   COPD with acute exacerbation (HCC) 12/06/2023    Priority: 2.   Acute on chronic respiratory failure with hypoxia (HCC) 01/01/2022    Priority: 1.   Aortic stenosis 12/07/2023    Priority: 3.   Occupational lung disease 11/30/2022   Hypertension associated with diabetes (HCC) 01/01/2022   Hyperlipidemia associated with type 2 diabetes mellitus (HCC) 01/01/2022   Type 2 diabetes mellitus [facility])     Resolved Hospital Problems  No resolved problems to display.     Discharge Instructions     Diet Carb Modified   Complete by: As directed    Increase activity slowly   Complete by: As directed       Allergies as of 12/07/2023       Reactions   Bee Venom Anaphylaxis   Cat Dander Itching, Swelling, Other (See Comments)   Runny nose, itchy/puffy eyes,         Medication List     TAKE  these medications    acetaminophen 500 MG tablet Commonly known as: TYLENOL Take 1,000 mg by mouth in the morning.   albuterol 108 (90 Base) MCG/ACT inhaler Commonly known as: VENTOLIN HFA Inhale 2 puffs into the lungs every 6 (six) hours as needed for wheezing or shortness of breath. What changed: Another medication with the same name was changed. Make sure you understand how and when to take each.   albuterol (2.5 MG/3ML) 0.083% nebulizer solution Commonly known as: PROVENTIL Take 3 mLs (2.5 mg total) by nebulization every 4 (four) hours as  needed for wheezing or shortness of breath. What changed: when to take this   amLODipine 10 MG tablet Commonly known as: NORVASC Take 10 mg by mouth at bedtime.   aspirin 81 MG chewable tablet Chew 162 mg by mouth once as needed (for chest pain).   atorvastatin 80 MG tablet Commonly known as: LIPITOR Take 80 mg by mouth daily.   Benadryl Allergy 25 MG tablet Generic drug: diphenhydrAMINE Take 50 mg by mouth 3 (three) times daily as needed (for post-nasal drip).   benzonatate 200 MG capsule Commonly known as: TESSALON Take 1 capsule (200 mg total) by mouth 3 (three) times daily as needed. What changed: reasons to take this   Breztri Aerosphere 160-9-4.8 MCG/ACT Aero Generic drug: budeson-glycopyrrolate-formoterol Inhale 2 puffs into the lungs 2 (two) times daily. Take 2 puffs first thing in am and then another 2 puffs about 12 hours later. What changed: additional instructions   cetirizine 10 MG tablet Commonly known as: ZYRTEC Take 10 mg by mouth 2 (two) times daily.   EPINEPHrine 0.3 mg/0.3 mL Soaj injection Commonly known as: EPI-PEN Inject 0.3 mg into the muscle as needed for anaphylaxis.   ezetimibe 10 MG tablet Commonly known as: ZETIA Take 10 mg by mouth daily.   famotidine 20 MG tablet Commonly known as: PEPCID TAKE 1 TABLET BY MOUTH EVERY DAY AFTER SUPPER What changed: See the new instructions.   fluticasone 50 MCG/ACT nasal spray Commonly known as: FLONASE Place 2 sprays into both nostrils in the morning and at bedtime.   HYDROcodone bit-homatropine 5-1.5 MG/5ML syrup Commonly known as: HYCODAN Take 5 mLs by mouth every 6 (six) hours as needed for cough.   ipratropium-albuterol 0.5-2.5 (3) MG/3ML Soln Commonly known as: DUONEB Take 3 mLs by nebulization every 3 (three) hours as needed (shortness of breath or wheezing).   Jardiance 25 MG Tabs tablet Generic drug: empagliflozin Take 25 mg by mouth in the morning.   levothyroxine 25 MCG  tablet Commonly known as: Synthroid Take 1 tablet (25 mcg total) by mouth daily before breakfast.   lip balm ointment Apply 1 Application topically as needed (for dryness- lips).   losartan 100 MG tablet Commonly known as: COZAAR Take 100 mg by mouth daily.   metFORMIN 1000 MG tablet Commonly known as: GLUCOPHAGE Take 1,000 mg by mouth in the morning and at bedtime.   Mounjaro 10 MG/0.5ML Pen Generic drug: tirzepatide Inject 10 mg into the skin once a week. What changed: when to take this   NON FORMULARY Navge - Clean the sinuses once a week   nystatin 100000 UNIT/ML suspension Commonly known as: MYCOSTATIN Take 5 mLs by mouth 4 (four) times daily as needed (as directed for thrush).   OneTouch Verio test strip Generic drug: glucose blood 1 each by Other route 5 (five) times daily.   OXYGEN Inhale 3-5 L/min into the lungs continuous.   predniSONE 10 MG tablet Commonly  known as: DELTASONE Take 4 tablets daily for 3 days, then 3 tabs daily for 3 days, then 2 tabs daily for 3 days, then 1 tab daily for 3 days   prochlorperazine 10 MG tablet Commonly known as: COMPAZINE Take 1 tablet (10 mg total) by mouth every 6 (six) hours as needed.   Repatha 140 MG/ML Sosy Generic drug: Evolocumab Inject 140 mg into the skin every 14 (fourteen) days.   tamsulosin 0.4 MG Caps capsule Commonly known as: FLOMAX Take 1 capsule (0.4 mg total) by mouth daily.        Allergies  Allergen Reactions   Bee Venom Anaphylaxis   Cat Dander Itching, Swelling and Other (See Comments)    Runny nose, itchy/puffy eyes,     Consultations:   Procedures:   Discharge Exam: BP (!) 150/93 (BP Location: Left Arm)   Pulse (!) 102   Temp 97.7 F (36.5 C) (Oral)   Resp 16   SpO2 95%  Physical Exam Constitutional:      General: He is not in acute distress.    Appearance: Normal appearance.  HENT:     Head: Normocephalic and atraumatic.     Mouth/Throat:     Mouth: Mucous membranes  are moist.  Eyes:     Extraocular Movements: Extraocular movements intact.  Cardiovascular:     Rate and Rhythm: Normal rate and regular rhythm.  Pulmonary:     Effort: Pulmonary effort is normal. No respiratory distress.     Breath sounds: Wheezing present.     Comments: Coarse breath sounds bilaterally  Abdominal:     General: Bowel sounds are normal. There is no distension.     Palpations: Abdomen is soft.     Tenderness: There is no abdominal tenderness.  Musculoskeletal:        General: Normal range of motion.     Cervical back: Normal range of motion and neck supple.  Skin:    General: Skin is warm and dry.  Neurological:     General: No focal deficit present.     Mental Status: He is alert.  Psychiatric:        Mood and Affect: Mood normal.        Behavior: Behavior normal.      The results of significant diagnostics from this hospitalization (including imaging, microbiology, ancillary and laboratory) are listed below for reference.   Microbiology: Recent Results (from the past 240 hours)  Resp panel by RT-PCR (RSV, Flu A&B, Covid) Anterior Nasal Swab     Status: None   Collection Time: 12/06/23 11:25 AM   Specimen: Anterior Nasal Swab  Result Value Ref Range Status   SARS Coronavirus 2 by RT PCR NEGATIVE NEGATIVE Final    Comment: (NOTE) SARS-CoV-2 target nucleic acids are NOT DETECTED.  The SARS-CoV-2 RNA is generally detectable in upper respiratory specimens during the acute phase of infection. The lowest concentration of SARS-CoV-2 viral copies this assay can detect is 138 copies/mL. A negative result does not preclude SARS-Cov-2 infection and should not be used as the sole basis for treatment or other patient management decisions. A negative result may occur with  improper specimen collection/handling, submission of specimen other than nasopharyngeal swab, presence of viral mutation(s) within the areas targeted by this assay, and inadequate number of  viral copies(<138 copies/mL). A negative result must be combined with clinical observations, patient history, and epidemiological information. The expected result is Negative.  Fact Sheet for Patients:  BloggerCourse.com  Fact Sheet for  Healthcare Providers:  SeriousBroker.it  This test is no t yet approved or cleared by the [place] and  has been authorized for detection and/or diagnosis of SARS-CoV-2 by FDA under an Emergency Use Authorization (EUA). This EUA will remain  in effect (meaning this test can be used) for the duration of the COVID-19 declaration under Section 564(b)(1) of the Act, 21 U.S.C.section 360bbb-3(b)(1), unless the authorization is terminated  or revoked sooner.       Influenza A by PCR NEGATIVE NEGATIVE Final   Influenza B by PCR NEGATIVE NEGATIVE Final    Comment: (NOTE) The Xpert Xpress SARS-CoV-2/FLU/RSV plus assay is intended as an aid in the diagnosis of influenza from Nasopharyngeal swab specimens and should not be used as a sole basis for treatment. Nasal washings and aspirates are unacceptable for Xpert Xpress SARS-CoV-2/FLU/RSV testing.  Fact Sheet for Patients: BloggerCourse.com  Fact Sheet for Healthcare Providers: SeriousBroker.it  This test is not yet approved or cleared by the [place] FDA and has been authorized for detection and/or diagnosis of SARS-CoV-2 by FDA under an Emergency Use Authorization (EUA). This EUA will remain in effect (meaning this test can be used) for the duration of the COVID-19 declaration under Section 564(b)(1) of the Act, 21 U.S.C. section 360bbb-3(b)(1), unless the authorization is terminated or revoked.     Resp Syncytial Virus by PCR NEGATIVE NEGATIVE Final    Comment: (NOTE) Fact Sheet for Patients: BloggerCourse.com  Fact Sheet for Healthcare  Providers: SeriousBroker.it  This test is not yet approved or cleared by the [place] FDA and has been authorized for detection and/or diagnosis of SARS-CoV-2 by FDA under an Emergency Use Authorization (EUA). This EUA will remain in effect (meaning this test can be used) for the duration of the COVID-19 declaration under Section 564(b)(1) of the Act, 21 U.S.C. section 360bbb-3(b)(1), unless the authorization is terminated or revoked.  Performed at [facility], [address] [place], [place] [address]      Labs: BNP (last 3 results) Recent Labs    06/28/23 0902 11/14/23 1635 12/06/23 1125  BNP 53.6 30.6 45.8   Basic Metabolic Panel: Recent Labs  Lab 12/06/23 1125  NA 137  K 4.6  CL 103  CO2 23  GLUCOSE 145*  BUN 14  CREATININE 1.07  CALCIUM 9.5   Liver Function Tests: Recent Labs  Lab 12/06/23 1125  AST 23  ALT 24  ALKPHOS 75  BILITOT 0.8  PROT 8.0  ALBUMIN 4.9   No results for input(s): "LIPASE", "AMYLASE" in the last 168 hours. No results for input(s): "AMMONIA" in the last 168 hours. CBC: Recent Labs  Lab 12/06/23 1125 12/07/23 0334  WBC 9.4 8.4  NEUTROABS 5.9  --   HGB 15.9 15.3  HCT 50.8 46.6  MCV 98.3 95.5  PLT 235 236   Cardiac Enzymes: No results for input(s): "CKTOTAL", "CKMB", "CKMBINDEX", "TROPONINI" in the last 168 hours. BNP: Invalid input(s): "POCBNP" CBG: Recent Labs  Lab 12/06/23 1625 12/06/23 1943 12/07/23 0730  GLUCAP 206* 234* 236*   D-Dimer No results for input(s): "DDIMER" in the last 72 hours. Hgb A1c No results for input(s): "HGBA1C" in the last 72 hours. Lipid Profile No results for input(s): "CHOL", "HDL", "LDLCALC", "TRIG", "CHOLHDL", "LDLDIRECT" in the last 72 hours. Thyroid function studies No results for input(s): "TSH", "T4TOTAL", "T3FREE", "THYROIDAB" in the last 72 hours.  Invalid input(s): "FREET3" Anemia work up No results for input(s):  "VITAMINB12", "FOLATE", "FERRITIN", "TIBC", "IRON", "RETICCTPCT" in  the last 72 hours. Urinalysis    Component Value Date/Time   COLORURINE STRAW (A) 06/15/2022 0456   APPEARANCEUR CLEAR 06/15/2022 0456   LABSPEC 1.030 06/15/2022 0456   PHURINE 5.0 06/15/2022 0456   GLUCOSEU >=500 (A) 06/15/2022 0456   HGBUR NEGATIVE 06/15/2022 0456   BILIRUBINUR NEGATIVE 06/15/2022 0456   KETONESUR NEGATIVE 06/15/2022 0456   PROTEINUR NEGATIVE 06/15/2022 0456   NITRITE NEGATIVE 06/15/2022 0456   LEUKOCYTESUR NEGATIVE 06/15/2022 0456   Sepsis Labs Recent Labs  Lab 12/06/23 1125 12/07/23 0334  WBC 9.4 8.4   Microbiology Recent Results (from the past 240 hours)  Resp panel by RT-PCR (RSV, Flu A&B, Covid) Anterior Nasal Swab     Status: None   Collection Time: 12/06/23 11:25 AM   Specimen: Anterior Nasal Swab  Result Value Ref Range Status   SARS Coronavirus 2 by RT PCR NEGATIVE NEGATIVE Final    Comment: (NOTE) SARS-CoV-2 target nucleic acids are NOT DETECTED.  The SARS-CoV-2 RNA is generally detectable in upper respiratory specimens during the acute phase of infection. The lowest concentration of SARS-CoV-2 viral copies this assay can detect is 138 copies/mL. A negative result does not preclude SARS-Cov-2 infection and should not be used as the sole basis for treatment or other patient management decisions. A negative result may occur with  improper specimen collection/handling, submission of specimen other than nasopharyngeal swab, presence of viral mutation(s) within the areas targeted by this assay, and inadequate number of viral copies(<138 copies/mL). A negative result must be combined with clinical observations, patient history, and epidemiological information. The expected result is Negative.  Fact Sheet for Patients:  BloggerCourse.com  Fact Sheet for Healthcare Providers:  SeriousBroker.it  This test is no t yet approved or  cleared by the [place] FDA and  has been authorized for detection and/or diagnosis of SARS-CoV-2 by FDA under an Emergency Use Authorization (EUA). This EUA will remain  in effect (meaning this test can be used) for the duration of the COVID-19 declaration under Section 564(b)(1) of the Act, 21 U.S.C.section 360bbb-3(b)(1), unless the authorization is terminated  or revoked sooner.       Influenza A by PCR NEGATIVE NEGATIVE Final   Influenza B by PCR NEGATIVE NEGATIVE Final    Comment: (NOTE) The Xpert Xpress SARS-CoV-2/FLU/RSV plus assay is intended as an aid in the diagnosis of influenza from Nasopharyngeal swab specimens and should not be used as a sole basis for treatment. Nasal washings and aspirates are unacceptable for Xpert Xpress SARS-CoV-2/FLU/RSV testing.  Fact Sheet for Patients: BloggerCourse.com  Fact Sheet for Healthcare Providers: SeriousBroker.it  This test is not yet approved or cleared by the [place] FDA and has been authorized for detection and/or diagnosis of SARS-CoV-2 by FDA under an Emergency Use Authorization (EUA). This EUA will remain in effect (meaning this test can be used) for the duration of the COVID-19 declaration under Section 564(b)(1) of the Act, 21 U.S.C. section 360bbb-3(b)(1), unless the authorization is terminated or revoked.     Resp Syncytial Virus by PCR NEGATIVE NEGATIVE Final    Comment: (NOTE) Fact Sheet for Patients: BloggerCourse.com  Fact Sheet for Healthcare Providers: SeriousBroker.it  This test is not yet approved or cleared by the [place] FDA and has been authorized for detection and/or diagnosis of SARS-CoV-2 by FDA under an Emergency Use Authorization (EUA). This EUA will remain in effect (meaning this test can be used) for the duration of the COVID-19 declaration under Section 564(b)(1) of the Act, 21  U.S.C. section 360bbb-3(b)(1), unless the authorization is terminated or revoked.  Performed at [facility], [address], [place], [place] [address]     Procedures/Studies: DG Chest Port 1 View Result Date: 12/06/2023 CLINICAL DATA:  Shortness of breath EXAM: PORTABLE CHEST 1 VIEW COMPARISON:  11/14/2023 FINDINGS: Heart size upper limits of normal. Chronic aortic atherosclerosis. The lungs are clear. No edema, infiltrate, collapse or effusion. IMPRESSION: No active disease. Aortic atherosclerosis. Electronically Signed   By: Paulina Fusi M.D.   On: 12/06/2023 13:35   CARDIAC CATHETERIZATION Result Date: 11/20/2023 HEMODYNAMICS: RA:       5 mmHg (mean) RV:       30/1, 5 mmHg PA:       30/8 mmHg (16 mean) PCWP: 8 mmHg (mean)    Estimated Fick CO/CI   4.54L/min, 2L/min/m2 IMPRESSION: Left dominant, normal coronary arteriography. Normal left and right heart filling pressures. Normal pulmonary artery pressures. Moderately reduced cardiac output in the setting of severe AS. RECOMMENDATIONS: Continue workup for valve replacement.   ECHOCARDIOGRAM COMPLETE Result Date: 11/15/2023    ECHOCARDIOGRAM REPORT   Patient Name:   Russell Burns Date of Exam: 11/15/2023 Medical Rec #:  [id number]         Height:       70.0 in Accession #:    [id number]        Weight:       244.1 lb Date of Birth:  Feb 08, 1966         BSA:          2.272 m Patient Age:    57 years          BP:           110/74 mmHg Patient Gender: M                 HR:           80 bpm. Exam Location:  Inpatient Procedure: 2D Echo, Cardiac Doppler, Color Doppler and Intracardiac            Opacification Agent (Both Spectral and Color Flow Doppler were            utilized during procedure). Indications:    CHF-Acute Diastolic I50.31  History:        Patient has prior history of Echocardiogram examinations, most                 recent 02/15/2023. CHF; Risk Factors:Hypertension and Diabetes.  Sonographer:    Webb Laws  Referring Phys: Patrice.Shin MOHAMMAD L GARBA IMPRESSIONS  1. Aortic valve not well visualized but possibly bicuspid; severe AS (mean gradient 41 mmHg; AVA 0.56 cm2; DI 0.18); AS worse compared to 11/15/23.  2. Left ventricular ejection fraction, by estimation, is 60 to 65%. The left ventricle has normal function. The left ventricle has no regional wall motion abnormalities. The left ventricular internal cavity size was mildly dilated. Left ventricular diastolic parameters are consistent with Grade I diastolic dysfunction (impaired relaxation).  3. Right ventricular systolic function is normal. The right ventricular size is normal.  4. The mitral valve is normal in structure. No evidence of mitral valve regurgitation. No evidence of mitral stenosis.  5. The aortic valve has an indeterminant number of cusps. Aortic valve regurgitation is not visualized. Severe aortic valve stenosis.  6. Aortic dilatation noted. There is mild dilatation of the aortic root, measuring 40 mm.  7. The inferior vena cava is normal in size with greater than  50% respiratory variability, suggesting right atrial pressure of 3 mmHg. FINDINGS  Left Ventricle: Left ventricular ejection fraction, by estimation, is 60 to 65%. The left ventricle has normal function. The left ventricle has no regional wall motion abnormalities. Definity contrast agent was given IV to delineate the left ventricular  endocardial borders. Strain imaging was not performed. The left ventricular internal cavity size was mildly dilated. There is no left ventricular hypertrophy. Left ventricular diastolic parameters are consistent with Grade I diastolic dysfunction (impaired relaxation). Right Ventricle: The right ventricular size is normal. Right ventricular systolic function is normal. Left Atrium: Left atrial size was normal in size. Right Atrium: Right atrial size was normal in size. Pericardium: There is no evidence of pericardial effusion. Mitral Valve: The mitral valve is  normal in structure. Mild mitral annular calcification. No evidence of mitral valve regurgitation. No evidence of mitral valve stenosis. Tricuspid Valve: The tricuspid valve is normal in structure. Tricuspid valve regurgitation is not demonstrated. No evidence of tricuspid stenosis. Aortic Valve: The aortic valve has an indeterminant number of cusps. Aortic valve regurgitation is not visualized. Severe aortic stenosis is present. Aortic valve mean gradient measures 41.4 mmHg. Aortic valve peak gradient measures 67.7 mmHg. Aortic valve area, by VTI measures 0.56 cm. Pulmonic Valve: The pulmonic valve was not well visualized. Pulmonic valve regurgitation is not visualized. No evidence of pulmonic stenosis. Aorta: Aortic dilatation noted. There is mild dilatation of the aortic root, measuring 40 mm. Venous: The inferior vena cava is normal in size with greater than 50% respiratory variability, suggesting right atrial pressure of 3 mmHg. IAS/Shunts: No atrial level shunt detected by color flow Doppler. Additional Comments: Aortic valve not well visualized but possibly bicuspid; severe AS (mean gradient 41 mmHg; AVA 0.56 cm2; DI 0.18); AS worse compared to 11/15/23. 3D imaging was not performed.  LEFT VENTRICLE PLAX 2D LVIDd:         5.50 cm      Diastology LVIDs:         3.40 cm      LV e' medial:    5.98 cm/s LV PW:         1.10 cm      LV E/e' medial:  13.0 LV IVS:        0.80 cm      LV e' lateral:   8.49 cm/s LVOT diam:     2.00 cm      LV E/e' lateral: 9.2 LV SV:         53 LV SV Index:   23 LVOT Area:     3.14 cm  LV Volumes (MOD) LV vol d, MOD A2C: 131.0 ml LV vol d, MOD A4C: 107.0 ml LV vol s, MOD A2C: 64.0 ml LV vol s, MOD A4C: 56.9 ml LV SV MOD A2C:     67.0 ml LV SV MOD A4C:     107.0 ml LV SV MOD BP:      56.6 ml RIGHT VENTRICLE            IVC RV S prime:     9.79 cm/s  IVC diam: 1.70 cm TAPSE (M-mode): 3.5 cm LEFT ATRIUM             Index        RIGHT ATRIUM           Index LA diam:        3.90 cm 1.72  cm/m   RA Area:     10.50  cm LA Vol (A2C):   42.6 ml 18.75 ml/m  RA Volume:   17.70 ml  7.79 ml/m LA Vol (A4C):   40.8 ml 17.96 ml/m LA Biplane Vol: 44.7 ml 19.68 ml/m  AORTIC VALVE AV Area (Vmax):    0.61 cm AV Area (Vmean):   0.57 cm AV Area (VTI):     0.56 cm AV Vmax:           411.40 cm/s AV Vmean:          301.400 cm/s AV VTI:            0.940 m AV Peak Grad:      67.7 mmHg AV Mean Grad:      41.4 mmHg LVOT Vmax:         80.00 cm/s LVOT Vmean:        54.700 cm/s LVOT VTI:          0.169 m LVOT/AV VTI ratio: 0.18  AORTA Ao Root diam: 3.20 cm Ao Asc diam:  4.00 cm MITRAL VALVE MV Area (PHT): 3.48 cm    SHUNTS MV Decel Time: 218 msec    Systemic VTI:  0.17 m MV E velocity: 78.00 cm/s  Systemic Diam: 2.00 cm MV A velocity: 83.60 cm/s MV E/A ratio:  0.93 Olga Millers MD Electronically signed by Olga Millers MD Signature Date/Time: 11/15/2023/2:08:30 PM    Final    DG Chest Port 1 View Result Date: 11/14/2023 CLINICAL DATA:  Two-week history of cough and shortness of breath EXAM: PORTABLE CHEST 1 VIEW COMPARISON:  Chest radiograph dated 06/28/2023 FINDINGS: Normal lung volumes. No focal consolidations. No pleural effusion or pneumothorax. The heart size and mediastinal contours are within normal limits. No acute osseous abnormality. IMPRESSION: No acute disease. Electronically Signed   By: Agustin Cree M.D.   On: 11/14/2023 16:42     Time coordinating discharge: Over 30 minutes    Lewie Chamber, MD  [facility] Hospitalists 12/07/2023, 11:48 AM

## 2023-12-07 NOTE — Hospital Course (Signed)
 Mr. Russell Burns is a 58 yo male with PMH severe aortic stenosis, asthmatic bronchitis, COPD, occupational lung disease, DM II who presented with shortness of breath.   Patient was hospitalized 2/20 to 11/20/23 with acute hypoxemic respiratory failure, felt to be due to asthmatic bronchitis versus COPD exacerbation.  During that hospital stay he was found to have severe progression of his aortic stenosis which is now severe.  He underwent left and right heart catheterization, was seen by cardiology with plans for outpatient CT surgery evaluation for likely aortic valve replacement or TAVR.  He has this consultation with CT surgery next week.    In the last couple of days, he has had increasing shortness of breath with wheezing, nonproductive cough, and chest tightness.  He does have some orthopnea, but he denies any chest pain or lower extremity edema.  He was treated in the emergency department with multiple DuoNeb breathing treatments, as well as IV steroids.  SOB/dyspnea improved after steroid treatment.  He is chronically on 2 L oxygen at home and was stable around 4 L at discharge.  He was prescribed prednisone taper at discharge and will continue outpatient follow-ups with pulmonology and upcoming CT surgery.

## 2023-12-07 NOTE — Plan of Care (Signed)
  Problem: Tissue Perfusion: Goal: Adequacy of tissue perfusion will improve Outcome: Progressing   Problem: Health Behavior/Discharge Planning: Goal: Ability to manage health-related needs will improve Outcome: Progressing   Problem: Clinical Measurements: Goal: Respiratory complications will improve Outcome: Progressing   Problem: Coping: Goal: Level of anxiety will decrease Outcome: Progressing   Problem: Safety: Goal: Ability to remain free from injury will improve Outcome: Progressing

## 2023-12-12 ENCOUNTER — Encounter: Payer: BC Managed Care – PPO | Admitting: Thoracic Surgery (Cardiothoracic Vascular Surgery)

## 2023-12-12 DIAGNOSIS — I350 Nonrheumatic aortic (valve) stenosis: Secondary | ICD-10-CM | POA: Diagnosis not present

## 2023-12-12 DIAGNOSIS — I7121 Aneurysm of the ascending aorta, without rupture: Secondary | ICD-10-CM | POA: Diagnosis not present

## 2023-12-20 DIAGNOSIS — E119 Type 2 diabetes mellitus without complications: Secondary | ICD-10-CM | POA: Diagnosis not present

## 2023-12-20 DIAGNOSIS — I7121 Aneurysm of the ascending aorta, without rupture: Secondary | ICD-10-CM | POA: Diagnosis not present

## 2023-12-20 DIAGNOSIS — Z01818 Encounter for other preprocedural examination: Secondary | ICD-10-CM | POA: Diagnosis not present

## 2023-12-20 DIAGNOSIS — I350 Nonrheumatic aortic (valve) stenosis: Secondary | ICD-10-CM | POA: Diagnosis not present

## 2023-12-20 DIAGNOSIS — I158 Other secondary hypertension: Secondary | ICD-10-CM | POA: Diagnosis not present

## 2023-12-20 DIAGNOSIS — Z79899 Other long term (current) drug therapy: Secondary | ICD-10-CM | POA: Diagnosis not present

## 2023-12-20 DIAGNOSIS — E038 Other specified hypothyroidism: Secondary | ICD-10-CM | POA: Diagnosis not present

## 2023-12-23 DIAGNOSIS — G4733 Obstructive sleep apnea (adult) (pediatric): Secondary | ICD-10-CM | POA: Diagnosis not present

## 2023-12-23 DIAGNOSIS — I10 Essential (primary) hypertension: Secondary | ICD-10-CM | POA: Diagnosis not present

## 2023-12-24 DIAGNOSIS — E785 Hyperlipidemia, unspecified: Secondary | ICD-10-CM | POA: Diagnosis not present

## 2023-12-24 DIAGNOSIS — E039 Hypothyroidism, unspecified: Secondary | ICD-10-CM | POA: Diagnosis not present

## 2023-12-24 DIAGNOSIS — Z9981 Dependence on supplemental oxygen: Secondary | ICD-10-CM | POA: Diagnosis not present

## 2023-12-24 DIAGNOSIS — N400 Enlarged prostate without lower urinary tract symptoms: Secondary | ICD-10-CM | POA: Diagnosis not present

## 2023-12-24 DIAGNOSIS — E1159 Type 2 diabetes mellitus with other circulatory complications: Secondary | ICD-10-CM | POA: Diagnosis not present

## 2023-12-24 DIAGNOSIS — Z794 Long term (current) use of insulin: Secondary | ICD-10-CM | POA: Diagnosis not present

## 2023-12-24 DIAGNOSIS — Z6838 Body mass index (BMI) 38.0-38.9, adult: Secondary | ICD-10-CM | POA: Diagnosis not present

## 2023-12-24 DIAGNOSIS — I110 Hypertensive heart disease with heart failure: Secondary | ICD-10-CM | POA: Diagnosis not present

## 2023-12-24 DIAGNOSIS — I5032 Chronic diastolic (congestive) heart failure: Secondary | ICD-10-CM | POA: Diagnosis not present

## 2023-12-24 DIAGNOSIS — Z7982 Long term (current) use of aspirin: Secondary | ICD-10-CM | POA: Diagnosis not present

## 2023-12-24 DIAGNOSIS — I1310 Hypertensive heart and chronic kidney disease without heart failure, with stage 1 through stage 4 chronic kidney disease, or unspecified chronic kidney disease: Secondary | ICD-10-CM | POA: Diagnosis not present

## 2023-12-24 DIAGNOSIS — Z950 Presence of cardiac pacemaker: Secondary | ICD-10-CM | POA: Diagnosis not present

## 2023-12-24 DIAGNOSIS — Z45018 Encounter for adjustment and management of other part of cardiac pacemaker: Secondary | ICD-10-CM | POA: Diagnosis not present

## 2023-12-24 DIAGNOSIS — R9431 Abnormal electrocardiogram [ECG] [EKG]: Secondary | ICD-10-CM | POA: Diagnosis not present

## 2023-12-24 DIAGNOSIS — Q2381 Bicuspid aortic valve: Secondary | ICD-10-CM | POA: Diagnosis not present

## 2023-12-24 DIAGNOSIS — R918 Other nonspecific abnormal finding of lung field: Secondary | ICD-10-CM | POA: Diagnosis not present

## 2023-12-24 DIAGNOSIS — J9811 Atelectasis: Secondary | ICD-10-CM | POA: Diagnosis not present

## 2023-12-24 DIAGNOSIS — N183 Chronic kidney disease, stage 3 unspecified: Secondary | ICD-10-CM | POA: Diagnosis not present

## 2023-12-24 DIAGNOSIS — K219 Gastro-esophageal reflux disease without esophagitis: Secondary | ICD-10-CM | POA: Diagnosis not present

## 2023-12-24 DIAGNOSIS — I7121 Aneurysm of the ascending aorta, without rupture: Secondary | ICD-10-CM | POA: Diagnosis not present

## 2023-12-24 DIAGNOSIS — G4733 Obstructive sleep apnea (adult) (pediatric): Secondary | ICD-10-CM | POA: Diagnosis not present

## 2023-12-24 DIAGNOSIS — I10 Essential (primary) hypertension: Secondary | ICD-10-CM | POA: Diagnosis not present

## 2023-12-24 DIAGNOSIS — E1122 Type 2 diabetes mellitus with diabetic chronic kidney disease: Secondary | ICD-10-CM | POA: Diagnosis not present

## 2023-12-24 DIAGNOSIS — T45AX5A Adverse effect of immune checkpoint inhibitors and immunostimulant drugs, initial encounter: Secondary | ICD-10-CM | POA: Diagnosis not present

## 2023-12-24 DIAGNOSIS — I340 Nonrheumatic mitral (valve) insufficiency: Secondary | ICD-10-CM | POA: Diagnosis not present

## 2023-12-24 DIAGNOSIS — I350 Nonrheumatic aortic (valve) stenosis: Secondary | ICD-10-CM | POA: Diagnosis not present

## 2023-12-24 DIAGNOSIS — I152 Hypertension secondary to endocrine disorders: Secondary | ICD-10-CM | POA: Diagnosis not present

## 2023-12-24 DIAGNOSIS — J9611 Chronic respiratory failure with hypoxia: Secondary | ICD-10-CM | POA: Diagnosis not present

## 2023-12-24 DIAGNOSIS — I444 Left anterior fascicular block: Secondary | ICD-10-CM | POA: Diagnosis not present

## 2023-12-24 DIAGNOSIS — J4489 Other specified chronic obstructive pulmonary disease: Secondary | ICD-10-CM | POA: Diagnosis not present

## 2023-12-24 DIAGNOSIS — I5030 Unspecified diastolic (congestive) heart failure: Secondary | ICD-10-CM | POA: Diagnosis not present

## 2023-12-24 DIAGNOSIS — D62 Acute posthemorrhagic anemia: Secondary | ICD-10-CM | POA: Diagnosis not present

## 2023-12-26 ENCOUNTER — Inpatient Hospital Stay: Payer: BC Managed Care – PPO | Admitting: Adult Health

## 2024-01-03 DIAGNOSIS — I130 Hypertensive heart and chronic kidney disease with heart failure and stage 1 through stage 4 chronic kidney disease, or unspecified chronic kidney disease: Secondary | ICD-10-CM | POA: Diagnosis not present

## 2024-01-03 DIAGNOSIS — I350 Nonrheumatic aortic (valve) stenosis: Secondary | ICD-10-CM | POA: Diagnosis not present

## 2024-01-03 DIAGNOSIS — E1165 Type 2 diabetes mellitus with hyperglycemia: Secondary | ICD-10-CM | POA: Diagnosis not present

## 2024-01-17 DIAGNOSIS — E1165 Type 2 diabetes mellitus with hyperglycemia: Secondary | ICD-10-CM | POA: Diagnosis not present

## 2024-01-29 DIAGNOSIS — I7121 Aneurysm of the ascending aorta, without rupture: Secondary | ICD-10-CM | POA: Diagnosis not present

## 2024-01-31 DIAGNOSIS — F331 Major depressive disorder, recurrent, moderate: Secondary | ICD-10-CM | POA: Diagnosis not present

## 2024-01-31 DIAGNOSIS — F411 Generalized anxiety disorder: Secondary | ICD-10-CM | POA: Diagnosis not present

## 2024-02-04 DIAGNOSIS — R0902 Hypoxemia: Secondary | ICD-10-CM | POA: Diagnosis not present

## 2024-02-05 DIAGNOSIS — Z954 Presence of other heart-valve replacement: Secondary | ICD-10-CM | POA: Diagnosis not present

## 2024-02-05 DIAGNOSIS — Z95818 Presence of other cardiac implants and grafts: Secondary | ICD-10-CM | POA: Diagnosis not present

## 2024-02-05 DIAGNOSIS — I7121 Aneurysm of the ascending aorta, without rupture: Secondary | ICD-10-CM | POA: Diagnosis not present

## 2024-02-05 DIAGNOSIS — I350 Nonrheumatic aortic (valve) stenosis: Secondary | ICD-10-CM | POA: Diagnosis not present

## 2024-02-12 DIAGNOSIS — Z952 Presence of prosthetic heart valve: Secondary | ICD-10-CM | POA: Diagnosis not present

## 2024-02-13 DIAGNOSIS — Z952 Presence of prosthetic heart valve: Secondary | ICD-10-CM | POA: Diagnosis not present

## 2024-02-14 DIAGNOSIS — F331 Major depressive disorder, recurrent, moderate: Secondary | ICD-10-CM | POA: Diagnosis not present

## 2024-02-14 DIAGNOSIS — F411 Generalized anxiety disorder: Secondary | ICD-10-CM | POA: Diagnosis not present

## 2024-02-19 DIAGNOSIS — Z952 Presence of prosthetic heart valve: Secondary | ICD-10-CM | POA: Diagnosis not present

## 2024-02-20 DIAGNOSIS — Z952 Presence of prosthetic heart valve: Secondary | ICD-10-CM | POA: Diagnosis not present

## 2024-02-24 DIAGNOSIS — Z952 Presence of prosthetic heart valve: Secondary | ICD-10-CM | POA: Diagnosis not present

## 2024-02-26 DIAGNOSIS — Z952 Presence of prosthetic heart valve: Secondary | ICD-10-CM | POA: Diagnosis not present

## 2024-02-27 DIAGNOSIS — Z952 Presence of prosthetic heart valve: Secondary | ICD-10-CM | POA: Diagnosis not present

## 2024-02-28 DIAGNOSIS — Z5181 Encounter for therapeutic drug level monitoring: Secondary | ICD-10-CM | POA: Diagnosis not present

## 2024-02-28 DIAGNOSIS — F331 Major depressive disorder, recurrent, moderate: Secondary | ICD-10-CM | POA: Diagnosis not present

## 2024-02-28 DIAGNOSIS — F411 Generalized anxiety disorder: Secondary | ICD-10-CM | POA: Diagnosis not present

## 2024-02-29 ENCOUNTER — Other Ambulatory Visit: Payer: Self-pay | Admitting: Internal Medicine

## 2024-02-29 DIAGNOSIS — R053 Chronic cough: Secondary | ICD-10-CM

## 2024-03-06 DIAGNOSIS — R0902 Hypoxemia: Secondary | ICD-10-CM | POA: Diagnosis not present

## 2024-03-09 DIAGNOSIS — D2262 Melanocytic nevi of left upper limb, including shoulder: Secondary | ICD-10-CM | POA: Diagnosis not present

## 2024-03-09 DIAGNOSIS — D2239 Melanocytic nevi of other parts of face: Secondary | ICD-10-CM | POA: Diagnosis not present

## 2024-03-09 DIAGNOSIS — D225 Melanocytic nevi of trunk: Secondary | ICD-10-CM | POA: Diagnosis not present

## 2024-03-09 DIAGNOSIS — L905 Scar conditions and fibrosis of skin: Secondary | ICD-10-CM | POA: Diagnosis not present

## 2024-03-09 DIAGNOSIS — D485 Neoplasm of uncertain behavior of skin: Secondary | ICD-10-CM | POA: Diagnosis not present

## 2024-03-11 DIAGNOSIS — Z952 Presence of prosthetic heart valve: Secondary | ICD-10-CM | POA: Diagnosis not present

## 2024-03-12 DIAGNOSIS — Z952 Presence of prosthetic heart valve: Secondary | ICD-10-CM | POA: Diagnosis not present

## 2024-03-16 DIAGNOSIS — Z952 Presence of prosthetic heart valve: Secondary | ICD-10-CM | POA: Diagnosis not present

## 2024-03-18 DIAGNOSIS — Z952 Presence of prosthetic heart valve: Secondary | ICD-10-CM | POA: Diagnosis not present

## 2024-03-23 DIAGNOSIS — L988 Other specified disorders of the skin and subcutaneous tissue: Secondary | ICD-10-CM | POA: Diagnosis not present

## 2024-03-23 DIAGNOSIS — D485 Neoplasm of uncertain behavior of skin: Secondary | ICD-10-CM | POA: Diagnosis not present

## 2024-03-23 DIAGNOSIS — Z952 Presence of prosthetic heart valve: Secondary | ICD-10-CM | POA: Diagnosis not present

## 2024-03-25 DIAGNOSIS — Z952 Presence of prosthetic heart valve: Secondary | ICD-10-CM | POA: Diagnosis not present

## 2024-03-26 DIAGNOSIS — Z952 Presence of prosthetic heart valve: Secondary | ICD-10-CM | POA: Diagnosis not present

## 2024-03-30 DIAGNOSIS — Z952 Presence of prosthetic heart valve: Secondary | ICD-10-CM | POA: Diagnosis not present

## 2024-04-01 DIAGNOSIS — Z952 Presence of prosthetic heart valve: Secondary | ICD-10-CM | POA: Diagnosis not present

## 2024-04-02 DIAGNOSIS — Z952 Presence of prosthetic heart valve: Secondary | ICD-10-CM | POA: Diagnosis not present

## 2024-04-05 DIAGNOSIS — R0902 Hypoxemia: Secondary | ICD-10-CM | POA: Diagnosis not present

## 2024-04-06 DIAGNOSIS — Z952 Presence of prosthetic heart valve: Secondary | ICD-10-CM | POA: Diagnosis not present

## 2024-04-08 DIAGNOSIS — Z952 Presence of prosthetic heart valve: Secondary | ICD-10-CM | POA: Diagnosis not present

## 2024-04-10 DIAGNOSIS — F331 Major depressive disorder, recurrent, moderate: Secondary | ICD-10-CM | POA: Diagnosis not present

## 2024-04-10 DIAGNOSIS — F411 Generalized anxiety disorder: Secondary | ICD-10-CM | POA: Diagnosis not present

## 2024-04-12 ENCOUNTER — Observation Stay (HOSPITAL_COMMUNITY)
Admission: EM | Admit: 2024-04-12 | Discharge: 2024-04-13 | Disposition: A | Discharge status: Home / Self Care | Attending: Internal Medicine | Admitting: Internal Medicine

## 2024-04-12 ENCOUNTER — Encounter (HOSPITAL_COMMUNITY): Payer: Self-pay

## 2024-04-12 ENCOUNTER — Emergency Department (HOSPITAL_COMMUNITY)

## 2024-04-12 ENCOUNTER — Inpatient Hospital Stay (HOSPITAL_COMMUNITY)

## 2024-04-12 ENCOUNTER — Other Ambulatory Visit: Payer: Self-pay

## 2024-04-12 DIAGNOSIS — J441 Chronic obstructive pulmonary disease with (acute) exacerbation: Principal | ICD-10-CM | POA: Diagnosis present

## 2024-04-12 DIAGNOSIS — G4733 Obstructive sleep apnea (adult) (pediatric): Secondary | ICD-10-CM | POA: Diagnosis not present

## 2024-04-12 DIAGNOSIS — Z79899 Other long term (current) drug therapy: Secondary | ICD-10-CM | POA: Insufficient documentation

## 2024-04-12 DIAGNOSIS — E785 Hyperlipidemia, unspecified: Secondary | ICD-10-CM | POA: Insufficient documentation

## 2024-04-12 DIAGNOSIS — I5032 Chronic diastolic (congestive) heart failure: Secondary | ICD-10-CM | POA: Insufficient documentation

## 2024-04-12 DIAGNOSIS — Z7984 Long term (current) use of oral hypoglycemic drugs: Secondary | ICD-10-CM | POA: Diagnosis not present

## 2024-04-12 DIAGNOSIS — Z859 Personal history of malignant neoplasm, unspecified: Secondary | ICD-10-CM | POA: Diagnosis not present

## 2024-04-12 DIAGNOSIS — E119 Type 2 diabetes mellitus without complications: Secondary | ICD-10-CM

## 2024-04-12 DIAGNOSIS — I130 Hypertensive heart and chronic kidney disease with heart failure and stage 1 through stage 4 chronic kidney disease, or unspecified chronic kidney disease: Secondary | ICD-10-CM | POA: Diagnosis not present

## 2024-04-12 DIAGNOSIS — R0600 Dyspnea, unspecified: Secondary | ICD-10-CM | POA: Diagnosis present

## 2024-04-12 DIAGNOSIS — E1122 Type 2 diabetes mellitus with diabetic chronic kidney disease: Secondary | ICD-10-CM | POA: Insufficient documentation

## 2024-04-12 DIAGNOSIS — Z905 Acquired absence of kidney: Secondary | ICD-10-CM | POA: Diagnosis not present

## 2024-04-12 DIAGNOSIS — J9601 Acute respiratory failure with hypoxia: Secondary | ICD-10-CM | POA: Diagnosis not present

## 2024-04-12 DIAGNOSIS — Z952 Presence of prosthetic heart valve: Secondary | ICD-10-CM | POA: Insufficient documentation

## 2024-04-12 DIAGNOSIS — I700 Atherosclerosis of aorta: Secondary | ICD-10-CM | POA: Diagnosis not present

## 2024-04-12 DIAGNOSIS — N182 Chronic kidney disease, stage 2 (mild): Secondary | ICD-10-CM | POA: Insufficient documentation

## 2024-04-12 DIAGNOSIS — Z7982 Long term (current) use of aspirin: Secondary | ICD-10-CM | POA: Insufficient documentation

## 2024-04-12 DIAGNOSIS — J432 Centrilobular emphysema: Secondary | ICD-10-CM | POA: Diagnosis not present

## 2024-04-12 DIAGNOSIS — I2510 Atherosclerotic heart disease of native coronary artery without angina pectoris: Secondary | ICD-10-CM | POA: Diagnosis not present

## 2024-04-12 DIAGNOSIS — E039 Hypothyroidism, unspecified: Secondary | ICD-10-CM | POA: Diagnosis present

## 2024-04-12 DIAGNOSIS — R0602 Shortness of breath: Secondary | ICD-10-CM | POA: Diagnosis not present

## 2024-04-12 LAB — CBC
HCT: 48.1 % (ref 39.0–52.0)
Hemoglobin: 14.7 g/dL (ref 13.0–17.0)
MCH: 26.6 pg (ref 26.0–34.0)
MCHC: 30.6 g/dL (ref 30.0–36.0)
MCV: 87.1 fL (ref 80.0–100.0)
Platelets: 210 K/uL (ref 150–400)
RBC: 5.52 MIL/uL (ref 4.22–5.81)
RDW: 14.8 % (ref 11.5–15.5)
WBC: 9.2 K/uL (ref 4.0–10.5)
nRBC: 0 % (ref 0.0–0.2)

## 2024-04-12 LAB — BLOOD GAS, VENOUS
Acid-Base Excess: 3.8 mmol/L — ABNORMAL HIGH (ref 0.0–2.0)
Bicarbonate: 31.6 mmol/L — ABNORMAL HIGH (ref 20.0–28.0)
O2 Saturation: 46.8 %
Patient temperature: 37
pCO2, Ven: 60 mmHg (ref 44–60)
pH, Ven: 7.33 (ref 7.25–7.43)
pO2, Ven: 31 mmHg — CL (ref 32–45)

## 2024-04-12 LAB — BASIC METABOLIC PANEL WITH GFR
Anion gap: 10 (ref 5–15)
BUN: 17 mg/dL (ref 6–20)
CO2: 25 mmol/L (ref 22–32)
Calcium: 9.2 mg/dL (ref 8.9–10.3)
Chloride: 103 mmol/L (ref 98–111)
Creatinine, Ser: 1.22 mg/dL (ref 0.61–1.24)
GFR, Estimated: >60 mL/min (ref >60)
Glucose, Bld: 114 mg/dL — ABNORMAL HIGH (ref 70–99)
Potassium: 4.6 mmol/L (ref 3.5–5.1)
Sodium: 138 mmol/L (ref 135–145)

## 2024-04-12 LAB — BRAIN NATRIURETIC PEPTIDE: B Natriuretic Peptide: 225.5 pg/mL — ABNORMAL HIGH (ref 0.0–100.0)

## 2024-04-12 LAB — TROPONIN I (HIGH SENSITIVITY): Troponin I (High Sensitivity): 8 ng/L (ref <18)

## 2024-04-12 MED ORDER — METHYLPREDNISOLONE SODIUM SUCC 125 MG IJ SOLR
125.0000 mg | Freq: Once | INTRAMUSCULAR | Status: AC
  Administered 2024-04-12: 125 mg via INTRAVENOUS
  Filled 2024-04-12: qty 2

## 2024-04-12 MED ORDER — IPRATROPIUM-ALBUTEROL 0.5-2.5 (3) MG/3ML IN SOLN
3.0000 mL | Freq: Once | RESPIRATORY_TRACT | Status: AC
  Administered 2024-04-12: 3 mL via RESPIRATORY_TRACT
  Filled 2024-04-12: qty 3

## 2024-04-12 MED ORDER — IOHEXOL 350 MG/ML SOLN
75.0000 mL | Freq: Once | INTRAVENOUS | Status: AC | PRN
  Administered 2024-04-12: 75 mL via INTRAVENOUS

## 2024-04-12 MED ORDER — MAGNESIUM SULFATE 2 GM/50ML IV SOLN
2.0000 g | Freq: Once | INTRAVENOUS | Status: AC
  Administered 2024-04-12: 2 g via INTRAVENOUS
  Filled 2024-04-12: qty 50

## 2024-04-12 MED ORDER — SODIUM CHLORIDE 0.9 % IV BOLUS
1000.0000 mL | Freq: Once | INTRAVENOUS | Status: AC
  Administered 2024-04-12: 1000 mL via INTRAVENOUS

## 2024-04-12 NOTE — ED Triage Notes (Signed)
 Pt reports:  Low Oxygen  88%-89% at home Has oxygen  at home but first time needing it since heart valve replacement Dec 24, 2023

## 2024-04-12 NOTE — ED Provider Notes (Signed)
 [place] EMERGENCY DEPARTMENT AT [facility] Provider Note  CSN: [id number] Arrival date & time: 04/12/24 1755  Chief Complaint(s) Shortness of Breath  HPI Russell Burns is a 59 y.o. male history of aortic stenosis status post aortic valve replacement, COPD, CKD, diabetes presenting to the emergency department shortness of breath.  Reports having shortness of breath starting today.  Reports associated wheezing, dry cough.  Tried home breathing treatment without improvement.  No chest pain, back pain, abdominal pain.  No leg swelling.  Feels similar to prior episodes.  Has oxygen  at home but never usually needs it, but today did have to use it.   Past Medical History Past Medical History:  Diagnosis Date   Allergy    Aortic stenosis    Arthritis    Blood transfusion without reported diagnosis    Cancer (HCC)    Chronic kidney disease    Diabetes mellitus without complication (HCC)    FH: thoracic aneurysm    Hyperlipidemia    Hypertension    Obesity    Patient Active Problem List   Diagnosis Date Noted   COPD exacerbation (HCC) 04/12/2024   Aortic stenosis 12/07/2023   COPD with acute exacerbation (HCC) 12/06/2023   Moderate persistent asthma with exacerbation 11/20/2023   Hypothyroidism 11/14/2023   AKI (acute kidney injury) (HCC) 11/14/2023   Class 1 obesity 06/28/2023   COVID-19 virus infection 06/28/2023   Eosinophilia 06/28/2023   Acute hypoxemic respiratory failure (HCC) 02/19/2023   Occupational lung disease 11/30/2022   Encounter for antineoplastic immunotherapy 11/15/2022   Asthmatic bronchitis , chronic (HCC) 06/27/2022   GERD (gastroesophageal reflux disease) 06/14/2022   DOE (dyspnea on exertion) 06/11/2022   Chronic cough 06/11/2022   Malignant neoplasm of left kidney (HCC) 05/02/2022   Renal mass 03/12/2022   Preop cardiovascular exam 02/07/2022   Chronic diastolic CHF (congestive heart failure) (HCC) 01/02/2022   Acute on chronic  respiratory failure with hypoxia (HCC) 01/01/2022   Hypertension associated with diabetes (HCC) 01/01/2022   Thoracic aortic aneurysm (HCC) 01/01/2022   Hyperlipidemia associated with type 2 diabetes mellitus (HCC) 01/01/2022   Left renal mass 01/01/2022   Type 2 diabetes mellitus (HCC)    Home Medication(s) Prior to Admission medications   Medication Sig Start Date End Date Taking? Authorizing Provider  acetaminophen  (TYLENOL ) 500 MG tablet Take 1,000 mg by mouth in the morning.    [provider]  albuterol  (PROVENTIL ) (2.5 MG/3ML) 0.083% nebulizer solution Take 3 mLs (2.5 mg total) by nebulization every 4 (four) hours as needed for wheezing or shortness of breath. 12/07/23   Patsy Lenis, MD  albuterol  (VENTOLIN  HFA) 108 [phone] Base) MCG/ACT inhaler Inhale 2 puffs into the lungs every 6 (six) hours as needed for wheezing or shortness of breath. 12/11/21   [provider]  amLODipine  (NORVASC ) 10 MG tablet Take 10 mg by mouth at bedtime. 08/01/20   [provider]  aspirin  81 MG chewable tablet Chew 162 mg by mouth once as needed (for chest pain).    [provider]  atorvastatin  (LIPITOR ) 80 MG tablet Take 80 mg by mouth daily.    [provider]  BENADRYL  ALLERGY 25 MG tablet Take 50 mg by mouth 3 (three) times daily as needed (for post-nasal drip).    [provider]  benzonatate  (TESSALON ) 200 MG capsule Take 1 capsule (200 mg total) by mouth 3 (three) times daily as needed. Patient taking differently: Take 200 mg by mouth 3 (three) times daily  as needed for cough. 07/02/23   Akula, Vijaya, MD  Budeson-Glycopyrrol-Formoterol  (BREZTRI  AEROSPHERE) 160-9-4.8 MCG/ACT AERO Inhale 2 puffs into the lungs 2 (two) times daily. Take 2 puffs first thing in am and then another 2 puffs about 12 hours later. Patient taking differently: Inhale 2 puffs into the lungs 2 (two) times daily. 12/12/22   Darlean Ozell NOVAK, MD  cetirizine (ZYRTEC) 10 MG tablet Take  10 mg by mouth 2 (two) times daily.    [provider]  EPINEPHrine  0.3 mg/0.3 mL IJ SOAJ injection Inject 0.3 mg into the muscle as needed for anaphylaxis.    [provider]  ezetimibe  (ZETIA ) 10 MG tablet Take 10 mg by mouth daily. 08/01/20   [provider]  famotidine  (PEPCID ) 20 MG tablet TAKE 1 TABLET BY MOUTH EVERY DAY AFTER SUPPER 03/01/24   Wert, Michael B, MD  fluticasone  (FLONASE ) 50 MCG/ACT nasal spray Place 2 sprays into both nostrils in the morning and at bedtime.    [provider]  HYDROcodone  bit-homatropine (HYCODAN) 5-1.5 MG/5ML syrup Take 5 mLs by mouth every 6 (six) hours as needed for cough. Patient not taking: Reported on 12/06/2023 11/17/23   Lue Elsie BROCKS, MD  ipratropium-albuterol  (DUONEB) 0.5-2.5 (3) MG/3ML SOLN Take 3 mLs by nebulization every 3 (three) hours as needed (shortness of breath or wheezing). 11/22/22   [provider]  JARDIANCE  25 MG TABS tablet Take 25 mg by mouth in the morning. 07/10/20   [provider]  levothyroxine  (SYNTHROID ) 25 MCG tablet Take 1 tablet (25 mcg total) by mouth daily before breakfast. 12/07/22   Heilingoetter, Cassandra L, PA-C  lip balm (CARMEX) ointment Apply 1 Application topically as needed (for dryness- lips).    [provider]  losartan  (COZAAR ) 100 MG tablet Take 100 mg by mouth daily.    [provider]  metFORMIN  (GLUCOPHAGE ) 1000 MG tablet Take 1,000 mg by mouth in the morning and at bedtime.    [provider]  NON FORMULARY Navge - Clean the sinuses once a week    [provider]  nystatin (MYCOSTATIN) 100000 UNIT/ML suspension Take 5 mLs by mouth 4 (four) times daily as needed (as directed for thrush).    [provider]  [facility] VERIO test strip 1 each by Other route 5 (five) times daily. 10/31/22   [provider]  OXYGEN  Inhale 3-5 L/min into the lungs continuous.    [provider]  predniSONE   (DELTASONE ) 10 MG tablet Take 4 tablets daily for 3 days, then 3 tabs daily for 3 days, then 2 tabs daily for 3 days, then 1 tab daily for 3 days 12/07/23   Patsy Lenis, MD  prochlorperazine  (COMPAZINE ) 10 MG tablet Take 1 tablet (10 mg total) by mouth every 6 (six) hours as needed. Patient not taking: Reported on 12/06/2023 12/27/22   Sherrod Sherrod, MD  REPATHA 140 MG/ML SOSY Inject 140 mg into the skin every 14 (fourteen) days.    [provider]  tamsulosin  (FLOMAX ) 0.4 MG CAPS capsule Take 1 capsule (0.4 mg total) by mouth daily. 02/12/22   Carolee Sherwood JONETTA DOUGLAS, MD  tirzepatide  (MOUNJARO ) 10 MG/0.5ML Pen Inject 10 mg into the skin once a week. Patient taking differently: Inject 10 mg into the skin every Friday. 11/14/21  Past Surgical History Past Surgical History:  Procedure Laterality Date   CYSTOSCOPY WITH URETEROSCOPY AND STENT PLACEMENT Left 02/12/2022   Procedure: CYSTOSCOPY WITH LEFT RETROGRADE PYELOGRAM, DIAGNOSTIC URETEROSCOPY AND STENT PLACEMENT;  Surgeon: Carolee Sherwood JONETTA DOUGLAS, MD;  Location: [facility];  Service: Urology;  Laterality: Left;   LAPAROSCOPIC NEPHRECTOMY, HAND ASSISTED Left 03/12/2022   Procedure: LEFT HAND ASSISTED LAPAROSCOPIC NEPHRECTOMY;  Surgeon: Carolee Sherwood JONETTA DOUGLAS, MD;  Location: [facility];  Service: Urology;  Laterality: Left;  NEED 2.5 HRS FOR THIS CASE   RIGHT HEART CATH AND CORONARY ANGIOGRAPHY N/A 11/19/2023   Procedure: RIGHT HEART CATH AND CORONARY ANGIOGRAPHY;  Surgeon: Zenaida Morene PARAS, MD;  Location: [facility];  Service: Cardiovascular;  Laterality: N/A;   TONSILLECTOMY     WISDOM TOOTH EXTRACTION     Family History Family History  Problem Relation Age of Onset   CAD Other    Diabetes Mellitus II Other    Hypertension Other    Heart failure Other    Hypertension Mother    Heart attack Father        82, 26 at  both ages   Diabetes Mellitus II Father        insulin  dependent   Aneurysm Maternal Uncle        aortic   Heart attack Paternal Uncle 26       minor   Hypertension Paternal Uncle    Heart attack Maternal Grandmother 65       massive   Aneurysm Maternal Grandfather        abdominal   Heart attack Paternal Grandfather 104   Aneurysm Cousin 44       aortic   Colon polyps Neg Hx    Esophageal cancer Neg Hx    Rectal cancer Neg Hx    Stomach cancer Neg Hx     Social History Social History   Tobacco Use   Smoking status: Never   Smokeless tobacco: Current    Types: Chew  Vaping Use   Vaping status: Never Used  Substance Use Topics   Alcohol  use: Yes    Comment: 3 drinks a week.    Drug use: No   Allergies Bee venom and Cat dander  Review of Systems Review of Systems  All other systems reviewed and are negative.   Physical Exam Vital Signs  I have reviewed the triage vital signs BP (!) 159/92   Pulse 90   Temp 98.5 F (36.9 C) (Oral)   Resp 12   Ht 5' 10 (1.778 m)   Wt 106.6 kg   SpO2 92%   BMI 33.72 kg/m  Physical Exam Vitals and nursing note reviewed.  Constitutional:      General: He is not in acute distress.    Appearance: Normal appearance.  HENT:     Mouth/Throat:     Mouth: Mucous membranes are moist.  Eyes:     Conjunctiva/sclera: Conjunctivae normal.  Cardiovascular:     Rate and Rhythm: Normal rate and regular rhythm.  Pulmonary:     Effort: Tachypnea, accessory muscle usage and respiratory distress present.     Breath sounds: Examination of the right-upper field reveals wheezing. Examination of the left-upper field reveals wheezing. Examination of the right-middle field reveals wheezing. Examination of the left-middle field reveals wheezing. Examination of the right-lower field reveals wheezing. Examination of the left-lower field reveals wheezing. Wheezing present.  Abdominal:     General: Abdomen is flat.  Palpations: Abdomen is soft.      Tenderness: There is no abdominal tenderness.  Musculoskeletal:     Right lower leg: No edema.     Left lower leg: No edema.  Skin:    General: Skin is warm and dry.     Capillary Refill: Capillary refill takes less than 2 seconds.  Neurological:     Mental Status: He is alert and oriented to person, place, and time. Mental status is at baseline.  Psychiatric:        Mood and Affect: Mood normal.        Behavior: Behavior normal.     ED Results and Treatments Labs (all labs ordered are listed, but only abnormal results are displayed) Labs Reviewed  BASIC METABOLIC PANEL WITH GFR - Abnormal; Notable for the following components:      Result Value   Glucose, Bld 114 (*)    All other components within normal limits  BLOOD GAS, VENOUS - Abnormal; Notable for the following components:   pO2, Ven 31 (*)    Bicarbonate 31.6 (*)    Acid-Base Excess 3.8 (*)    All other components within normal limits  BRAIN NATRIURETIC PEPTIDE - Abnormal; Notable for the following components:   B Natriuretic Peptide 225.5 (*)    All other components within normal limits  CBC                                                                                                                          Radiology DG Chest 2 View Result Date: 04/12/2024 CLINICAL DATA:  sob EXAM: CHEST - 2 VIEW COMPARISON:  Chest x-ray 12/06/2023 FINDINGS: The heart and mediastinal contours are within normal limits. Aortic valve replacement noted. No focal consolidation. No pulmonary edema. No pleural effusion. No pneumothorax. No acute osseous abnormality.  Intact sternotomy wires. IMPRESSION: No active cardiopulmonary disease. Electronically Signed   By: Morgane  Naveau M.D.   On: 04/12/2024 19:19    Pertinent labs & imaging results that were available during my care of the patient were reviewed by me and considered in my medical decision making (see MDM for details).  Medications Ordered in ED Medications  sodium  chloride 0.9 % bolus 1,000 mL (0 mLs Intravenous Stopped 04/12/24 2033)  methylPREDNISolone  sodium succinate (SOLU-MEDROL ) 125 mg/2 mL injection 125 mg (125 mg Intravenous Given 04/12/24 1926)  magnesium  sulfate IVPB 2 g 50 mL (0 g Intravenous Stopped 04/12/24 1944)  ipratropium-albuterol  (DUONEB) 0.5-2.5 (3) MG/3ML nebulizer solution 3 mL (3 mLs Nebulization Given 04/12/24 1926)  ipratropium-albuterol  (DUONEB) 0.5-2.5 (3) MG/3ML nebulizer solution 3 mL (3 mLs Nebulization Given 04/12/24 2045)  Procedures .Critical Care  Performed by: Francesca Elsie CROME, MD Authorized by: Francesca Elsie CROME, MD   Critical care provider statement:    Critical care time (minutes):  30   Critical care was necessary to treat or prevent imminent or life-threatening deterioration of the following conditions:  Respiratory failure   Critical care was time spent personally by me on the following activities:  Development of treatment plan with patient or surrogate, discussions with consultants, evaluation of patient's response to treatment, examination of patient, ordering and review of laboratory studies, ordering and review of radiographic studies, ordering and performing treatments and interventions, pulse oximetry, re-evaluation of patient's condition and review of old charts   Care discussed with: admitting provider     (including critical care time)  Medical Decision Making / ED Course   MDM:  58 year old presenting to the emergency department shortness of breath.  Patient initially with mild respiratory distress, tachypnea, accessory muscle use, hypoxic.  Patient placed on oxygen , given breathing treatments.  Suspect COPD exacerbation.  Symptoms did significantly improve following steroid, magnesium , DuoNeb.  He appears much more comfortable.  Chest x-ray obtained with no evidence  of pneumothorax, pneumonia.  No lower extremity swelling or crackles to suggest CHF exacerbation.  No chest pain or other symptoms to suggest ACS.  Attempted to ambulate patient, however he became dyspneic very quickly and had recurrent symptoms, so believe he will need to be admitted overnight for observation.  Discussed with hospitalist Dr. Debby who has accepted patient for admission.      Additional history obtained: -Additional history obtained from family -External records from outside source obtained and reviewed including: Chart review including previous notes, labs, imaging, consultation notes including prior admission for same    Lab Tests: -I ordered, reviewed, and interpreted labs.   The pertinent results include:   Labs Reviewed  BASIC METABOLIC PANEL WITH GFR - Abnormal; Notable for the following components:      Result Value   Glucose, Bld 114 (*)    All other components within normal limits  BLOOD GAS, VENOUS - Abnormal; Notable for the following components:   pO2, Ven 31 (*)    Bicarbonate 31.6 (*)    Acid-Base Excess 3.8 (*)    All other components within normal limits  BRAIN NATRIURETIC PEPTIDE - Abnormal; Notable for the following components:   B Natriuretic Peptide 225.5 (*)    All other components within normal limits  CBC    Notable for mild hyperglycemia   EKG   EKG Interpretation Date/Time:  Sunday April 12 2024 18:05:03 EDT Ventricular Rate:  113 PR Interval:  171 QRS Duration:  107 QT Interval:  341 QTC Calculation: 470 R Axis:   -32  Text Interpretation: Sinus tachycardia Consider right atrial enlargement Low voltage, extremity leads Consider anterior infarct Nonspecific T abnormalities, lateral leads Confirmed by Francesca Elsie ([id number]) on 04/12/2024 8:47:24 PM         Imaging Studies ordered: I ordered imaging studies including CXR On my interpretation imaging demonstrates no acute process I independently visualized and interpreted  imaging. I agree with the radiologist interpretation   Medicines ordered and prescription drug management: Meds ordered this encounter  Medications   sodium chloride  0.9 % bolus 1,000 mL   methylPREDNISolone  sodium succinate (SOLU-MEDROL ) 125 mg/2 mL injection 125 mg    IV methylprednisolone  will be converted to either a q12h or q24h frequency with the same total daily dose (TDD).  Ordered Dose: 1 to 125 mg TDD; convert  to: TDD q24h.  Ordered Dose: 126 to 250 mg TDD; convert to: TDD div q12h.  Ordered Dose: >250 mg TDD; DAW.   magnesium  sulfate IVPB 2 g 50 mL   ipratropium-albuterol  (DUONEB) 0.5-2.5 (3) MG/3ML nebulizer solution 3 mL   ipratropium-albuterol  (DUONEB) 0.5-2.5 (3) MG/3ML nebulizer solution 3 mL    -I have reviewed the patients home medicines and have made adjustments as needed   Consultations Obtained: I requested consultation with the hospitalist,  and discussed lab and imaging findings as well as pertinent plan - they recommend: admission   Cardiac Monitoring: The patient was maintained on a cardiac monitor.  I personally viewed and interpreted the cardiac monitored which showed an underlying rhythm of: sinus tachycardia   Reevaluation: After the interventions noted above, I reevaluated the patient and found that their symptoms have improved  Co morbidities that complicate the patient evaluation  Past Medical History:  Diagnosis Date   Allergy    Aortic stenosis    Arthritis    Blood transfusion without reported diagnosis    Cancer (HCC)    Chronic kidney disease    Diabetes mellitus without complication (HCC)    FH: thoracic aneurysm    Hyperlipidemia    Hypertension    Obesity       Dispostion: Disposition decision including need for hospitalization was considered, and patient admitted to the hospital.    Final Clinical Impression(s) / ED Diagnoses Final diagnoses:  COPD exacerbation (HCC)     This chart was dictated using voice recognition  software.  Despite best efforts to proofread,  errors can occur which can change the documentation meaning.    Francesca Elsie CROME, MD 04/12/24 [phone]

## 2024-04-12 NOTE — ED Notes (Signed)
 Pt ambulated in room, O2 dropped to 88%, pt voiced being lightheaded and [facility]

## 2024-04-12 NOTE — ED Notes (Signed)
 Patient is back on 2L [place]

## 2024-04-12 NOTE — H&P (Signed)
 History and Physical    Russell Burns FMW:[id number] DOB: June 24, 1966 DOA: 04/12/2024  PCP: [name], MD  Patient coming from: home  I have personally briefly reviewed patient's old medical records in [facility]  Chief Complaint: progressive sob  HPI: Russell Burns is a 58 y.o. male with medical history significant of   RCC s/p left nephrectomy,Aortic stenosis with TAA s/p  SAVR and replacement of ascending aorta/hemiarch 4/25 s/p which patient has not used his O2 or cpap as he  has had no need after surgery. Patient also has  CKDIII, DmIII, HLD , HTN, Obesity , OSA,CHFpef, COPD,GERD, hypothyroidism. Patient presents to ED with progressive sob that started to today with noted sat in high 80's at home which is below his baseline. Patient notes associated dizziness with sob which is worse with exertion. Patient noted no chest pain, abdominal pain , no fever, but does endorse dry cough.  ED Course:  Afeb, bp 142/104, hr 113, rr 30, sat 89 % on ra   Labs  Wbc 9.2, hgb 14.7 , plt 210 Na 139,K 4.6, Cl 103, glu 114cr 1.22 Cxr NAD EKG nsr , RAE low voltage BNP 225.5 TX NS 1L 2gram mag Duoneb, solumedrol Review of Systems: As per HPI otherwise 10 point review of systems negative.   Past Medical History:  Diagnosis Date   Allergy    Aortic stenosis    Arthritis    Blood transfusion without reported diagnosis    Cancer (HCC)    Chronic kidney disease    Diabetes mellitus without complication (HCC)    FH: thoracic aneurysm    Hyperlipidemia    Hypertension    Obesity     Past Surgical History:  Procedure Laterality Date   CYSTOSCOPY WITH URETEROSCOPY AND STENT PLACEMENT Left 02/12/2022   Procedure: CYSTOSCOPY WITH LEFT RETROGRADE PYELOGRAM, DIAGNOSTIC URETEROSCOPY AND STENT PLACEMENT;  Surgeon: Carolee Sherwood Russell MOULD, MD;  Location: [facility];  Service: Urology;  Laterality: Left;   LAPAROSCOPIC NEPHRECTOMY, HAND ASSISTED Left 03/12/2022   Procedure: LEFT HAND ASSISTED  LAPAROSCOPIC NEPHRECTOMY;  Surgeon: Carolee Sherwood Russell MOULD, MD;  Location: [facility];  Service: Urology;  Laterality: Left;  NEED 2.5 HRS FOR THIS CASE   RIGHT HEART CATH AND CORONARY ANGIOGRAPHY N/A 11/19/2023   Procedure: RIGHT HEART CATH AND CORONARY ANGIOGRAPHY;  Surgeon: Zenaida Morene PARAS, MD;  Location: [facility];  Service: Cardiovascular;  Laterality: N/A;   TONSILLECTOMY     WISDOM TOOTH EXTRACTION       reports that he has never smoked. His smokeless tobacco use includes chew. He reports current alcohol  use. He reports that he does not use drugs.  Allergies  Allergen Reactions   Bee Venom Anaphylaxis   Cat Dander Itching, Swelling and Other (See Comments)    Runny nose, itchy/puffy eyes,     Family History  Problem Relation Age of Onset   CAD Other    Diabetes Mellitus II Other    Hypertension Other    Heart failure Other    Hypertension Mother    Heart attack Father        66, 54 at both ages   Diabetes Mellitus II Father        insulin  dependent   Aneurysm Maternal Uncle        aortic   Heart attack Paternal Uncle 19       minor   Hypertension Paternal Uncle    Heart attack Maternal Grandmother 23  massive   Aneurysm Maternal Grandfather        abdominal   Heart attack Paternal Grandfather 38   Aneurysm Cousin 44       aortic   Colon polyps Neg Hx    Esophageal cancer Neg Hx    Rectal cancer Neg Hx    Stomach cancer Neg Hx     Prior to Admission medications   Medication Sig Start Date End Date Taking? Authorizing Provider  acetaminophen  (TYLENOL ) 500 MG tablet Take 1,000 mg by mouth in the morning.    [provider]  albuterol  (PROVENTIL ) (2.5 MG/3ML) 0.083% nebulizer solution Take 3 mLs (2.5 mg total) by nebulization every 4 (four) hours as needed for wheezing or shortness of breath. 12/07/23   Patsy Lenis, MD  albuterol  (VENTOLIN  HFA) [phone] Base) MCG/ACT inhaler Inhale 2 puffs into the lungs every 6 (six) hours as needed for wheezing  or shortness of breath. 12/11/21   [provider]  amLODipine  (NORVASC ) 10 MG tablet Take 10 mg by mouth at bedtime. 08/01/20   [provider]  aspirin  81 MG chewable tablet Chew 162 mg by mouth once as needed (for chest pain).    [provider]  atorvastatin  (LIPITOR ) 80 MG tablet Take 80 mg by mouth daily.    [provider]  BENADRYL  ALLERGY 25 MG tablet Take 50 mg by mouth 3 (three) times daily as needed (for post-nasal drip).    [provider]  benzonatate  (TESSALON ) 200 MG capsule Take 1 capsule (200 mg total) by mouth 3 (three) times daily as needed. Patient taking differently: Take 200 mg by mouth 3 (three) times daily as needed for cough. 07/02/23   Akula, Vijaya, MD  Budeson-Glycopyrrol-Formoterol  (BREZTRI  AEROSPHERE) 160-9-4.8 MCG/ACT AERO Inhale 2 puffs into the lungs 2 (two) times daily. Take 2 puffs first thing in am and then another 2 puffs about 12 hours later. Patient taking differently: Inhale 2 puffs into the lungs 2 (two) times daily. 12/12/22   Darlean Ozell NOVAK, MD  cetirizine (ZYRTEC) 10 MG tablet Take 10 mg by mouth 2 (two) times daily.    [provider]  EPINEPHrine  0.3 mg/0.3 mL IJ SOAJ injection Inject 0.3 mg into the muscle as needed for anaphylaxis.    [provider]  ezetimibe  (ZETIA ) 10 MG tablet Take 10 mg by mouth daily. 08/01/20   [provider]  famotidine  (PEPCID ) 20 MG tablet TAKE 1 TABLET BY MOUTH EVERY DAY AFTER SUPPER 03/01/24   Wert, Michael B, MD  fluticasone  (FLONASE ) 50 MCG/ACT nasal spray Place 2 sprays into both nostrils in the morning and at bedtime.    [provider]  HYDROcodone  bit-homatropine (HYCODAN) 5-1.5 MG/5ML syrup Take 5 mLs by mouth every 6 (six) hours as needed for cough. Patient not taking: Reported on 12/06/2023 11/17/23   Lue Elsie BROCKS, MD  ipratropium-albuterol  (DUONEB) 0.5-2.5 (3) MG/3ML SOLN Take 3 mLs by nebulization every 3 (three) hours as needed  (shortness of breath or wheezing). 11/22/22   [provider]  JARDIANCE  25 MG TABS tablet Take 25 mg by mouth in the morning. 07/10/20   [provider]  levothyroxine  (SYNTHROID ) 25 MCG tablet Take 1 tablet (25 mcg total) by mouth daily before breakfast. 12/07/22   Heilingoetter, Cassandra L, PA-C  lip balm (CARMEX) ointment Apply 1 Application topically as needed (for dryness- lips).    [provider]  losartan  (COZAAR ) 100 MG tablet Take 100 mg by mouth daily.  [provider]  metFORMIN  (GLUCOPHAGE ) 1000 MG tablet Take 1,000 mg by mouth in the morning and at bedtime.    [provider]  NON FORMULARY Navge - Clean the sinuses once a week    [provider]  nystatin (MYCOSTATIN) 100000 UNIT/ML suspension Take 5 mLs by mouth 4 (four) times daily as needed (as directed for thrush).    [provider]  [facility] VERIO test strip 1 each by Other route 5 (five) times daily. 10/31/22   [provider]  OXYGEN  Inhale 3-5 L/min into the lungs continuous.    [provider]  predniSONE  (DELTASONE ) 10 MG tablet Take 4 tablets daily for 3 days, then 3 tabs daily for 3 days, then 2 tabs daily for 3 days, then 1 tab daily for 3 days 12/07/23   Patsy Lenis, MD  prochlorperazine  (COMPAZINE ) 10 MG tablet Take 1 tablet (10 mg total) by mouth every 6 (six) hours as needed. Patient not taking: Reported on 12/06/2023 12/27/22   Sherrod Sherrod, MD  REPATHA 140 MG/ML SOSY Inject 140 mg into the skin every 14 (fourteen) days.    [provider]  tamsulosin  (FLOMAX ) 0.4 MG CAPS capsule Take 1 capsule (0.4 mg total) by mouth daily. 02/12/22   Carolee Sherwood Russell DOUGLAS, MD  tirzepatide  (MOUNJARO ) 10 MG/0.5ML Pen Inject 10 mg into the skin once a week. Patient taking differently: Inject 10 mg into the skin every Friday. 11/14/21       Physical Exam: Vitals:   04/12/24 1918 04/12/24 2015 04/12/24 2130 04/12/24 2150  BP: (!) 150/92 (!)  162/90 (!) 159/92   Pulse: (!) 102 92 88 90  Resp: (!) 27 10 17 12   Temp:    98.5 F (36.9 C)  TempSrc:    Oral  SpO2: 97% 97% 96% 92%  Weight:      Height:        Constitutional: NAD, calm, comfortable Vitals:   04/12/24 1918 04/12/24 2015 04/12/24 2130 04/12/24 2150  BP: (!) 150/92 (!) 162/90 (!) 159/92   Pulse: (!) 102 92 88 90  Resp: (!) 27 10 17 12   Temp:    98.5 F (36.9 C)  TempSrc:    Oral  SpO2: 97% 97% 96% 92%  Weight:      Height:       Eyes: pupils are equal, lids and conjunctivae normal ENMT: Mucous membranes are moist. Posterior pharynx clear of any exudate or lesions.Normal dentition.  Neck: normal, supple, no masses, no thyromegaly Respiratory: clear to auscultation bilaterally, no wheezing, no crackles. Normal respiratory effort. No accessory muscle use.  Cardiovascular: Regular rate and rhythm, no murmurs / rubs / gallops. No extremity edema. 2+ pedal pulses.  Abdomen: no tenderness, no masses palpated. No hepatosplenomegaly. Bowel sounds positive.  Musculoskeletal: no clubbing / cyanosis. No joint deformity upper and lower extremities. Good ROM, no contractures. Normal muscle tone.  Skin: no rashes, lesions, ulcers. No induration Neurologic: CN  grossly intact. Sensation intact, strength 5/5 in all 4.  Psychiatric: Normal judgment and insight. Alert and oriented x 3. Normal mood.    Labs on Admission: I have personally reviewed following labs and imaging studies  CBC: Recent Labs  Lab 04/12/24 1906  WBC 9.2  HGB 14.7  HCT 48.1  MCV 87.1  PLT 210   Basic Metabolic Panel: Recent Labs  Lab 04/12/24 1906  NA 138  K 4.6  CL 103  CO2 25  GLUCOSE 114*  BUN 17  CREATININE 1.22  CALCIUM  9.2   GFR: Estimated Creatinine Clearance: 81.6 mL/min (by C-G formula based on SCr of 1.22 mg/dL). Liver Function Tests: No results for input(s): AST, ALT, ALKPHOS, BILITOT, PROT, ALBUMIN in the last 168 hours. No results for input(s): LIPASE,  AMYLASE in the last 168 hours. No results for input(s): AMMONIA in the last 168 hours. Coagulation Profile: No results for input(s): INR, PROTIME in the last 168 hours. Cardiac Enzymes: No results for input(s): CKTOTAL, CKMB, CKMBINDEX, TROPONINI in the last 168 hours. BNP (last 3 results) No results for input(s): PROBNP in the last 8760 hours. HbA1C: No results for input(s): HGBA1C in the last 72 hours. CBG: No results for input(s): GLUCAP in the last 168 hours. Lipid Profile: No results for input(s): CHOL, HDL, LDLCALC, TRIG, CHOLHDL, LDLDIRECT in the last 72 hours. Thyroid  Function Tests: No results for input(s): TSH, T4TOTAL, FREET4, T3FREE, THYROIDAB in the last 72 hours. Anemia Panel: No results for input(s): VITAMINB12, FOLATE, FERRITIN, TIBC, IRON, RETICCTPCT in the last 72 hours. Urine analysis:    Component Value Date/Time   COLORURINE STRAW (A) 06/15/2022 0456   APPEARANCEUR CLEAR 06/15/2022 0456   LABSPEC 1.030 06/15/2022 0456   PHURINE 5.0 06/15/2022 0456   GLUCOSEU >=500 (A) 06/15/2022 0456   HGBUR NEGATIVE 06/15/2022 0456   BILIRUBINUR NEGATIVE 06/15/2022 0456   KETONESUR NEGATIVE 06/15/2022 0456   PROTEINUR NEGATIVE 06/15/2022 0456   NITRITE NEGATIVE 06/15/2022 0456   LEUKOCYTESUR NEGATIVE 06/15/2022 0456    Radiological Exams on Admission: DG Chest 2 View Result Date: 04/12/2024 CLINICAL DATA:  sob EXAM: CHEST - 2 VIEW COMPARISON:  Chest x-ray 12/06/2023 FINDINGS: The heart and mediastinal contours are within normal limits. Aortic valve replacement noted. No focal consolidation. No pulmonary edema. No pleural effusion. No pneumothorax. No acute osseous abnormality.  Intact sternotomy wires. IMPRESSION: No active cardiopulmonary disease. Electronically Signed   By: Morgane  Naveau M.D.   On: 04/12/2024 19:19    EKG: Independently reviewed. See above     Assessment/Plan   Acute COPD exacerbation with  acute hypoxic respiratory failure  -exam currently improved s/p treatment in ED , still with mild bronchospasm with deep breathing noted with coughing on deep breathing but no wheezing -admit to progressive care  - s/p solumedrol iv , po prednisone  daily per protocol - levaquin  -f/u on sputum cultures  -consider further imaging of chest if patient does not improve -cxr : NAD - nebs standing and prn  -resume chronic inhalers  -pulmonary toilet  -wean O2 as able    Severe Aortic Stenosis ,TAA -s/p SAVR  and replacement of ascending aorta/hemiarch 4/25 -has done well since surgery ,no need for home O2 or CPAP per notes -cxr note no abnormality  - BNP stable  - EKG no acute findings  -CT chest pending ensure  no post surgical complications as cause of sob     CKDII -an acute issues    DmIII -iss/fs --on jardiance /mounjaro /metformin  at outpatient     HLD  -resume statin    HTN -not at goal  -resume home regimen once med rec completed   OSA -not using cpap  -has f/u soon for re-testing   CHFpef -appears well compensated    GERD -ppi   Hypothyroidism -resume hormone replacement   DVT prophylaxis: fheparin Code Status: full/ as discussed per patient wishes in event of cardiac arrest  Family Communication: none at bedside Disposition Plan: patient  expected to be admitted greater than 2 midnights  Consults called: n/a Admission  status: progressive care    Camila DELENA Ned MD [facility]   If 7PM-7AM, please contact night-coverage [email or web address] Password [facility]  04/12/2024, 10:37 PM

## 2024-04-13 ENCOUNTER — Observation Stay (HOSPITAL_COMMUNITY)

## 2024-04-13 DIAGNOSIS — J441 Chronic obstructive pulmonary disease with (acute) exacerbation: Principal | ICD-10-CM

## 2024-04-13 DIAGNOSIS — G4733 Obstructive sleep apnea (adult) (pediatric): Secondary | ICD-10-CM | POA: Diagnosis not present

## 2024-04-13 DIAGNOSIS — R0602 Shortness of breath: Secondary | ICD-10-CM | POA: Diagnosis not present

## 2024-04-13 DIAGNOSIS — R0600 Dyspnea, unspecified: Secondary | ICD-10-CM | POA: Diagnosis present

## 2024-04-13 DIAGNOSIS — E039 Hypothyroidism, unspecified: Secondary | ICD-10-CM | POA: Diagnosis not present

## 2024-04-13 DIAGNOSIS — Z79899 Other long term (current) drug therapy: Secondary | ICD-10-CM | POA: Diagnosis not present

## 2024-04-13 DIAGNOSIS — N182 Chronic kidney disease, stage 2 (mild): Secondary | ICD-10-CM | POA: Diagnosis not present

## 2024-04-13 DIAGNOSIS — Z7982 Long term (current) use of aspirin: Secondary | ICD-10-CM | POA: Diagnosis not present

## 2024-04-13 DIAGNOSIS — I130 Hypertensive heart and chronic kidney disease with heart failure and stage 1 through stage 4 chronic kidney disease, or unspecified chronic kidney disease: Secondary | ICD-10-CM | POA: Diagnosis not present

## 2024-04-13 DIAGNOSIS — E785 Hyperlipidemia, unspecified: Secondary | ICD-10-CM | POA: Diagnosis not present

## 2024-04-13 DIAGNOSIS — Z952 Presence of prosthetic heart valve: Secondary | ICD-10-CM | POA: Diagnosis not present

## 2024-04-13 DIAGNOSIS — Z7984 Long term (current) use of oral hypoglycemic drugs: Secondary | ICD-10-CM | POA: Diagnosis not present

## 2024-04-13 DIAGNOSIS — Z859 Personal history of malignant neoplasm, unspecified: Secondary | ICD-10-CM | POA: Diagnosis not present

## 2024-04-13 DIAGNOSIS — J9601 Acute respiratory failure with hypoxia: Secondary | ICD-10-CM | POA: Diagnosis not present

## 2024-04-13 DIAGNOSIS — E1122 Type 2 diabetes mellitus with diabetic chronic kidney disease: Secondary | ICD-10-CM | POA: Diagnosis not present

## 2024-04-13 DIAGNOSIS — I5032 Chronic diastolic (congestive) heart failure: Secondary | ICD-10-CM | POA: Diagnosis not present

## 2024-04-13 DIAGNOSIS — R0609 Other forms of dyspnea: Secondary | ICD-10-CM

## 2024-04-13 DIAGNOSIS — Z905 Acquired absence of kidney: Secondary | ICD-10-CM | POA: Diagnosis not present

## 2024-04-13 LAB — RESPIRATORY PANEL BY PCR

## 2024-04-13 LAB — ECHOCARDIOGRAM COMPLETE
AR max vel: 2.47 cm2
AV Area VTI: 2.4 cm2
AV Area mean vel: 2.4 cm2
AV Mean grad: 6 mmHg
AV Peak grad: 10.4 mmHg
Ao pk vel: 1.61 m/s
Area-P 1/2: 6.88 cm2
Height: 70 in
S' Lateral: 3.6 cm
Weight: 3619.07 [oz_av]

## 2024-04-13 LAB — COMPREHENSIVE METABOLIC PANEL WITH GFR
ALT: 17 U/L (ref 0–44)
AST: 22 U/L (ref 15–41)
Albumin: 4.6 g/dL (ref 3.5–5.0)
Alkaline Phosphatase: 77 U/L (ref 38–126)
Anion gap: 10 (ref 5–15)
BUN: 17 mg/dL (ref 6–20)
CO2: 22 mmol/L (ref 22–32)
Calcium: 9 mg/dL (ref 8.9–10.3)
Chloride: 104 mmol/L (ref 98–111)
Creatinine, Ser: 1.22 mg/dL (ref 0.61–1.24)
GFR, Estimated: >60 mL/min (ref >60)
Glucose, Bld: 188 mg/dL — ABNORMAL HIGH (ref 70–99)
Potassium: 4.7 mmol/L (ref 3.5–5.1)
Sodium: 136 mmol/L (ref 135–145)
Total Bilirubin: 0.7 mg/dL (ref 0.0–1.2)
Total Protein: 8 g/dL (ref 6.5–8.1)

## 2024-04-13 LAB — CBC
HCT: 48.8 % (ref 39.0–52.0)
Hemoglobin: 15 g/dL (ref 13.0–17.0)
MCH: 27.1 pg (ref 26.0–34.0)
MCHC: 30.7 g/dL (ref 30.0–36.0)
MCV: 88.2 fL (ref 80.0–100.0)
Platelets: 216 K/uL (ref 150–400)
RBC: 5.53 MIL/uL (ref 4.22–5.81)
RDW: 14.6 % (ref 11.5–15.5)
WBC: 10 K/uL (ref 4.0–10.5)
nRBC: 0 % (ref 0.0–0.2)

## 2024-04-13 LAB — GLUCOSE, CAPILLARY: Glucose-Capillary: 180 mg/dL — ABNORMAL HIGH (ref 70–99)

## 2024-04-13 LAB — TROPONIN I (HIGH SENSITIVITY): Troponin I (High Sensitivity): 6 ng/L (ref <18)

## 2024-04-13 MED ORDER — ONDANSETRON HCL 4 MG/2ML IJ SOLN
4.0000 mg | Freq: Four times a day (QID) | INTRAMUSCULAR | Status: DC | PRN
Start: 2024-04-13 — End: 2024-04-14

## 2024-04-13 MED ORDER — ACETAMINOPHEN 650 MG RE SUPP: 650.0000 mg | Freq: Four times a day (QID) | RECTAL | Status: DC | PRN

## 2024-04-13 MED ORDER — METFORMIN HCL 1000 MG PO TABS: 1000.0000 mg | ORAL_TABLET | Freq: Two times a day (BID) | ORAL | Status: DC

## 2024-04-13 MED ORDER — ASPIRIN 81 MG PO CHEW
162.0000 mg | CHEWABLE_TABLET | Freq: Once | ORAL | Status: DC | PRN
Start: 2024-04-13 — End: 2024-04-14

## 2024-04-13 MED ORDER — METHYLPREDNISOLONE SODIUM SUCC 40 MG IJ SOLR
40.0000 mg | Freq: Two times a day (BID) | INTRAMUSCULAR | Status: DC
  Administered 2024-04-13: 40 mg via INTRAVENOUS
  Filled 2024-04-13: qty 1

## 2024-04-13 MED ORDER — PREDNISONE 10 MG PO TABS: ORAL_TABLET | ORAL | 0 refills | Status: AC

## 2024-04-13 MED ORDER — ACETAMINOPHEN 325 MG PO TABS: 650.0000 mg | ORAL_TABLET | Freq: Four times a day (QID) | ORAL | Status: DC | PRN

## 2024-04-13 MED ORDER — LEVOTHYROXINE SODIUM 25 MCG PO TABS
25.0000 ug | ORAL_TABLET | Freq: Every day | ORAL | Status: DC
  Administered 2024-04-13: 25 ug via ORAL
  Filled 2024-04-13: qty 1

## 2024-04-13 MED ORDER — AZITHROMYCIN 500 MG PO TABS: 500.0000 mg | ORAL_TABLET | Freq: Every day | ORAL | 0 refills | Status: DC

## 2024-04-13 MED ORDER — AMLODIPINE BESYLATE 10 MG PO TABS: 10.0000 mg | ORAL_TABLET | Freq: Every day | ORAL | Status: DC

## 2024-04-13 MED ORDER — FAMOTIDINE 20 MG PO TABS
20.0000 mg | ORAL_TABLET | Freq: Every day | ORAL | Status: DC
Start: 2024-04-13 — End: 2024-04-14
  Administered 2024-04-13: 20 mg via ORAL
  Filled 2024-04-13: qty 1

## 2024-04-13 MED ORDER — HEPARIN SODIUM (PORCINE) 5000 UNIT/ML IJ SOLN
5000.0000 [IU] | Freq: Three times a day (TID) | INTRAMUSCULAR | Status: DC
  Administered 2024-04-13 (×2): 5000 [IU] via SUBCUTANEOUS
  Filled 2024-04-13 (×2): qty 1

## 2024-04-13 MED ORDER — ALBUTEROL SULFATE (2.5 MG/3ML) 0.083% IN NEBU: 2.5000 mg | INHALATION_SOLUTION | RESPIRATORY_TRACT | Status: DC | PRN

## 2024-04-13 MED ORDER — AZITHROMYCIN 500 MG PO TABS: 500.0000 mg | ORAL_TABLET | Freq: Every day | ORAL | Status: DC

## 2024-04-13 MED ORDER — PERFLUTREN LIPID MICROSPHERE
1.0000 mL | INTRAVENOUS | Status: AC | PRN
  Administered 2024-04-13: 2 mL via INTRAVENOUS

## 2024-04-13 MED ORDER — BUDESON-GLYCOPYRROL-FORMOTEROL 160-9-4.8 MCG/ACT IN AERO
2.0000 | INHALATION_SPRAY | Freq: Two times a day (BID) | RESPIRATORY_TRACT | Status: DC
  Administered 2024-04-13: 2 via RESPIRATORY_TRACT
  Filled 2024-04-13: qty 5.9

## 2024-04-13 MED ORDER — ATORVASTATIN CALCIUM 40 MG PO TABS
80.0000 mg | ORAL_TABLET | Freq: Every day | ORAL | Status: DC
  Administered 2024-04-13: 80 mg via ORAL
  Filled 2024-04-13: qty 2

## 2024-04-13 MED ORDER — ONDANSETRON HCL 4 MG PO TABS: 4.0000 mg | ORAL_TABLET | Freq: Four times a day (QID) | ORAL | Status: DC | PRN

## 2024-04-13 MED ORDER — EZETIMIBE 10 MG PO TABS
10.0000 mg | ORAL_TABLET | Freq: Every day | ORAL | Status: DC
  Administered 2024-04-13: 10 mg via ORAL
  Filled 2024-04-13: qty 1

## 2024-04-13 MED ORDER — IPRATROPIUM-ALBUTEROL 0.5-2.5 (3) MG/3ML IN SOLN
3.0000 mL | Freq: Four times a day (QID) | RESPIRATORY_TRACT | Status: DC
  Administered 2024-04-13 (×3): 3 mL via RESPIRATORY_TRACT
  Filled 2024-04-13 (×3): qty 3

## 2024-04-13 MED ORDER — SODIUM CHLORIDE 0.9 % IV SOLN
500.0000 mg | INTRAVENOUS | Status: AC
  Administered 2024-04-13: 500 mg via INTRAVENOUS
  Filled 2024-04-13: qty 5

## 2024-04-13 MED ORDER — PREDNISONE 10 MG PO TABS: 10.0000 mg | ORAL_TABLET | Freq: Every day | ORAL | 0 refills | Status: DC

## 2024-04-13 MED ORDER — LORATADINE 10 MG PO TABS
10.0000 mg | ORAL_TABLET | Freq: Every day | ORAL | Status: DC
  Administered 2024-04-13: 10 mg via ORAL
  Filled 2024-04-13: qty 1

## 2024-04-13 MED ORDER — PREDNISONE 20 MG PO TABS: 40.0000 mg | ORAL_TABLET | Freq: Every day | ORAL | Status: DC

## 2024-04-13 NOTE — Progress Notes (Signed)
 Patient ambulated in hallway on room air, SPO2 remained at 96% throughout.

## 2024-04-13 NOTE — Progress Notes (Addendum)
   04/13/24 1330  TOC Brief Assessment  Insurance and Status Reviewed  Patient has primary care physician Yes  Home environment has been reviewed Resides in single family home with spouse  Prior level of function: Independent with ADLs at baseline  Prior/Current Home Services No current home services  Social Drivers of Health Review SDOH reviewed no interventions necessary  Readmission risk has been reviewed Yes  Transition of care needs no transition of care needs at this time   Home oxygen  through [employer] was discontinued in March 2024.

## 2024-04-13 NOTE — Progress Notes (Signed)
SATURATION QUALIFICATIONS: (This note is used to comply with regulatory documentation for home oxygen)  Patient Saturations on Room Air at Rest = 96%  Patient Saturations on Room Air while Ambulating = 96%  Patient Saturations on 0 Liters of oxygen while Ambulating = 96%  Please briefly explain why patient needs home oxygen: 

## 2024-04-13 NOTE — Plan of Care (Signed)

## 2024-04-13 NOTE — Discharge Summary (Signed)
 Physician Discharge Summary  Russell Burns:[id number] DOB: [birth year] DOA: 04/12/2024  PCP: [name], MD  Admit date: 04/12/2024 Discharge date: 04/13/2024    Discharge Diagnoses:   Principal Problem:   COPD exacerbation [facility]) Active Problems:   Hypothyroidism      Hospital Course:  58 y/o male with RCC s/p left nephrectomy, Ao stenosis status post valve replacement, hypertension, COPD, BPH, diabetes mellitus and hypothyroidism who presented to the hospital for shortness of breath.   Pulse ox was noted to be 88 to 89% and respiratory rate noted to be 30.  Diagnosed with COPD exacerbation, started on steroids and azithromycin .  Principal Problem:   COPD exacerbation with acute respiratory failure - Has improved with Solu-Medrol  and azithromycin  - Weaned to room air today - No cough and no wheezing noted - Stable to discharge home  Active Problems:  Hypothyroidism Continue Synthroid          Discharge Instructions   Allergies as of 04/13/2024       Reactions   Bee Venom Anaphylaxis   Cat Dander Itching, Swelling, Other (See Comments)   Runny nose, itchy/puffy eyes,         Medication List     STOP taking these medications    escitalopram 10 MG tablet Commonly known as: LEXAPRO   HYDROcodone  bit-homatropine 5-1.5 MG/5ML syrup Commonly known as: HYCODAN   metoprolol  tartrate 50 MG tablet Commonly known as: LOPRESSOR    mupirocin  ointment 2 % Commonly known as: BACTROBAN        TAKE these medications    acetaminophen  500 MG tablet Commonly known as: TYLENOL  Take 1,000 mg by mouth in the morning.   albuterol  108 (90 Base) MCG/ACT inhaler Commonly known as: VENTOLIN  HFA Inhale 2 puffs into the lungs every 6 (six) hours as needed for wheezing or shortness of breath.   albuterol  (2.5 MG/3ML) 0.083% nebulizer solution Commonly known as: PROVENTIL  Take 3 mLs (2.5 mg total) by nebulization every 4 (four) hours as needed for wheezing or  shortness of breath.   amLODipine  10 MG tablet Commonly known as: NORVASC  Take 10 mg by mouth daily.   aspirin  81 MG chewable tablet Chew 81 mg by mouth daily.   atorvastatin  80 MG tablet Commonly known as: LIPITOR  Take 80 mg by mouth daily.   azithromycin  500 MG tablet Commonly known as: ZITHROMAX  Take 1 tablet (500 mg total) by mouth daily. Start taking on: April 14, 2024   benzonatate  200 MG capsule Commonly known as: TESSALON  Take 1 capsule (200 mg total) by mouth 3 (three) times daily as needed. What changed:  when to take this reasons to take this   Breztri  Aerosphere 160-9-4.8 MCG/ACT Aero inhaler Generic drug: budesonide -glycopyrrolate -formoterol  Inhale 2 puffs into the lungs 2 (two) times daily. Take 2 puffs first thing in am and then another 2 puffs about 12 hours later. What changed: additional instructions   cetirizine 10 MG tablet Commonly known as: ZYRTEC Take 10 mg by mouth daily as needed for allergies.   EPINEPHrine  0.3 mg/0.3 mL Soaj injection Commonly known as: EPI-PEN Inject 0.3 mg into the muscle as needed for anaphylaxis.   ezetimibe  10 MG tablet Commonly known as: ZETIA  Take 10 mg by mouth daily.   famotidine  20 MG tablet Commonly known as: PEPCID  TAKE 1 TABLET BY MOUTH EVERY DAY AFTER SUPPER What changed: See the new instructions.   fluticasone  50 MCG/ACT nasal spray Commonly known as: FLONASE  Place 2 sprays into both nostrils in the morning and at bedtime.  guaiFENesin  600 MG 12 hr tablet Commonly known as: MUCINEX  Take 1,200 mg by mouth 2 (two) times daily.   ipratropium-albuterol  0.5-2.5 (3) MG/3ML Soln Commonly known as: DUONEB Take 3 mLs by nebulization daily as needed (shortness of breath or wheezing).   Jardiance  25 MG Tabs tablet Generic drug: empagliflozin  Take 25 mg by mouth in the morning.   levothyroxine  25 MCG tablet Commonly known as: Synthroid  Take 1 tablet (25 mcg total) by mouth daily before breakfast.    losartan  25 MG tablet Commonly known as: COZAAR  Take 25 mg by mouth daily.   metFORMIN  1000 MG tablet Commonly known as: GLUCOPHAGE  Take 1 tablet (1,000 mg total) by mouth in the morning and at bedtime. Start taking on: April 14, 2024   Mounjaro  10 MG/0.5ML Pen Generic drug: tirzepatide  Inject 10 mg into the skin once a week. What changed: when to take this   OXYGEN  Inhale 5 L/min into the lungs daily as needed (SOB).   predniSONE  10 MG tablet Commonly known as: DELTASONE  Take 3 tablets (30 mg total) by mouth daily for 2 days, THEN 2 tablets (20 mg total) daily for 2 days, THEN 1 tablet (10 mg total) daily for 2 days. Start taking on: April 13, 2024 What changed: See the new instructions.   Repatha 140 MG/ML Sosy Generic drug: Evolocumab Inject 140 mg into the skin every 14 (fourteen) days.   tamsulosin  0.4 MG Caps capsule Commonly known as: FLOMAX  Take 1 capsule (0.4 mg total) by mouth daily.            The results of significant diagnostics from this hospitalization (including imaging, microbiology, ancillary and laboratory) are listed below for reference.    ECHOCARDIOGRAM COMPLETE Result Date: 04/13/2024    ECHOCARDIOGRAM REPORT   Patient Name:   Russell Burns Date of Exam: 04/13/2024 Medical Rec #:  [id number]         Height:       70.0 in Accession #:    [id number]        Weight:       226.2 lb Date of Birth:  1966-04-12         BSA:          2.199 m Patient Age:    58 years          BP:           166/104 mmHg Patient Gender: M                 HR:           90 bpm. Exam Location:  Inpatient Procedure: 2D Echo, Cardiac Doppler, Color Doppler and Intracardiac            Opacification Agent (Both Spectral and Color Flow Doppler were            utilized during procedure). Indications:    Dyspnea  History:        Patient has prior history of Echocardiogram examinations and                 Patient has no prior history of Echocardiogram examinations.                 CHF; Risk  Factors:Hypertension, Diabetes and Dyslipidemia.                 Aortic Valve: 27 mm bovine valve is present in the aortic  position. Procedure Date: 12/24/23.  Sonographer:    CM Referring Phys: [id number] SARA-MAIZ A THOMAS  Sonographer Comments: Technically difficult study due to poor echo windows. Asc Ao Repair 12/24/23 IMPRESSIONS  1. Left ventricular ejection fraction, by estimation, is 70 to 75%. The left ventricle has hyperdynamic function. Left ventricular endocardial border not optimally defined to evaluate regional wall motion. Left ventricular diastolic parameters are consistent with Grade I diastolic dysfunction (impaired relaxation).  2. Right ventricular systolic function is hyperdynamic. The right ventricular size is normal. Tricuspid regurgitation signal is inadequate for assessing PA pressure.  3. The mitral valve is grossly normal. No evidence of mitral valve regurgitation. No evidence of mitral stenosis.  4. The aortic valve has been repaired/replaced. Aortic valve regurgitation is not visualized. There is a 27 mm bovine valve present in the aortic position. Procedure Date: 12/24/23. Echo findings are consistent with normal structure and function of the aortic valve prosthesis. Aortic valve mean gradient measures 6.0 mmHg. Aortic valve Vmax measures 1.61 m/s.  5. Aortic root/ascending aorta has been repaired/replaced. Comparison(s): Technically poor quality study. The aortic root and the aortic valve have been replaced since the previous echo 11/15/2023. FINDINGS  Left Ventricle: Left ventricular ejection fraction, by estimation, is 70 to 75%. The left ventricle has hyperdynamic function. Left ventricular endocardial border not optimally defined to evaluate regional wall motion. Definity  contrast agent was given IV to delineate the left ventricular endocardial borders. The left ventricular internal cavity size was normal in size. Suboptimal image quality limits for assessment of left  ventricular hypertrophy. Abnormal (paradoxical) septal motion consistent with post-operative status. Left ventricular diastolic parameters are consistent with Grade I diastolic dysfunction (impaired relaxation). Normal left ventricular filling pressure. Right Ventricle: The right ventricular size is normal. Right vetricular wall thickness was not well visualized. Right ventricular systolic function is hyperdynamic. Tricuspid regurgitation signal is inadequate for assessing PA pressure. Left Atrium: Left atrial size was normal in size. Right Atrium: Right atrial size was not well visualized. Pericardium: There is no evidence of pericardial effusion. Mitral Valve: The mitral valve is grossly normal. No evidence of mitral valve regurgitation. No evidence of mitral valve stenosis. Tricuspid Valve: The tricuspid valve is grossly normal. Aortic Valve: The aortic valve has been repaired/replaced. Aortic valve regurgitation is not visualized. Aortic valve mean gradient measures 6.0 mmHg. Aortic valve peak gradient measures 10.4 mmHg. Aortic valve area, by VTI measures 2.40 cm. There is a 27 mm bovine valve present in the aortic position. Procedure Date: 12/24/23. Echo findings are consistent with normal structure and function of the aortic valve prosthesis. Pulmonic Valve: The pulmonic valve was not well visualized. Aorta: The aortic root was not well visualized and the aortic root/ascending aorta has been repaired/replaced. IAS/Shunts: The interatrial septum was not well visualized.  LEFT VENTRICLE PLAX 2D LVIDd:         5.30 cm   Diastology LVIDs:         3.60 cm   LV e' medial:    8.05 cm/s LV PW:         1.20 cm   LV E/e' medial:  9.3 LV IVS:        1.50 cm   LV e' lateral:   11.00 cm/s LVOT diam:     2.25 cm   LV E/e' lateral: 6.8 LV SV:         75 LV SV Index:   34 LVOT Area:     3.98 cm  RIGHT VENTRICLE TAPSE (M-mode): 2.0  cm LEFT ATRIUM             Index LA Vol (A2C):   45.8 ml 20.82 ml/m LA Vol (A4C):   54.0 ml  24.55 ml/m LA Biplane Vol: 54.9 ml 24.96 ml/m  AORTIC VALVE AV Area (Vmax):    2.47 cm AV Area (Vmean):   2.40 cm AV Area (VTI):     2.40 cm AV Vmax:           161.00 cm/s AV Vmean:          112.000 cm/s AV VTI:            0.313 m AV Peak Grad:      10.4 mmHg AV Mean Grad:      6.0 mmHg LVOT Vmax:         100.00 cm/s LVOT Vmean:        67.500 cm/s LVOT VTI:          0.189 m LVOT/AV VTI ratio: 0.60 MITRAL VALVE MV Area (PHT): 6.88 cm    SHUNTS MV E velocity: 74.60 cm/s  Systemic VTI:  0.19 m MV A velocity: 81.00 cm/s  Systemic Diam: 2.25 cm MV E/A ratio:  0.92 Mihai Croitoru MD Electronically signed by Jerel Balding MD Signature Date/Time: 04/13/2024/5:01:48 PM    Final    CT Angio Chest Pulmonary Embolism (PE) W or WO Contrast Result Date: 04/13/2024 EXAM: CTA of the Chest with contrast for PE 04/12/2024 11:47:57 PM TECHNIQUE: CTA of the chest was performed after the administration of intravenous contrast. Multiplanar reformatted images are provided for review. MIP images are provided for review. Automated exposure control, iterative reconstruction, and/or weight based adjustment of the mA/kV was utilized to reduce the radiation dose to as low as reasonably achievable. COMPARISON: CT chest dated 11/06/2023. CLINICAL HISTORY: Pulmonary embolism (PE) suspected, high prob. FINDINGS: PULMONARY ARTERIES: Pulmonary arteries are adequately opacified for evaluation. No evidence of pulmonary embolism. Main pulmonary artery is normal in caliber. MEDIASTINUM: Prosthetic aortic valve. Postsurgical changes related to prior CABG. Median sternotomy. Moderate coronary atherosclerosis of the LAD and left circumflex. Although not tailored for evaluation of the thoracic aorta, there is no evidence of thoracic aortic aneurysm or dissection. Thoracic aortic atherosclerosis. LYMPH NODES: No mediastinal, hilar or axillary lymphadenopathy. LUNGS AND PLEURA: Mild centrilobular and paraseptal emphysematous changes, upper lung  predominant. No focal consolidation or pulmonary edema. No pleural effusion or pneumothorax. UPPER ABDOMEN: Status post left nephrectomy. SOFT TISSUES AND BONES: Mild degenerative changes of the visualized thoracolumbar spine. No acute bone or soft tissue abnormality. IMPRESSION: 1. No evidence of pulmonary embolism. 2. Negative CT chest. Electronically signed by: Pinkie Pebbles MD 04/13/2024 12:00 AM EDT RP Workstation: [id number]   DG Chest 2 View Result Date: 04/12/2024 CLINICAL DATA:  sob EXAM: CHEST - 2 VIEW COMPARISON:  Chest x-ray 12/06/2023 FINDINGS: The heart and mediastinal contours are within normal limits. Aortic valve replacement noted. No focal consolidation. No pulmonary edema. No pleural effusion. No pneumothorax. No acute osseous abnormality.  Intact sternotomy wires. IMPRESSION: No active cardiopulmonary disease. Electronically Signed   By: Morgane  Naveau M.D.   On: 04/12/2024 19:19   Labs:   Basic Metabolic Panel: Recent Labs  Lab 04/12/24 1906 04/13/24 0043  NA 138 136  K 4.6 4.7  CL 103 104  CO2 25 22  GLUCOSE 114* 188*  BUN 17 17  CREATININE 1.22 1.22  CALCIUM  9.2 9.0     CBC: Recent Labs  Lab 04/12/24 1906 04/13/24 0043  WBC 9.2 10.0  HGB 14.7 15.0  HCT 48.1 48.8  MCV 87.1 88.2  PLT 210 216         SIGNED:   True Atlas, MD  [facility] 04/13/2024, 5:17 PM Time taking on discharge: 50 minutes

## 2024-04-13 NOTE — Plan of Care (Signed)
 Problem: Education: Goal: Knowledge of General Education information will improve Description: Including pain rating scale, medication(s)/side effects and non-pharmacologic comfort measures 04/13/2024 1830 by Russell Elspeth HERO, RN Outcome: Adequate for Discharge 04/13/2024 1047 by Russell Elspeth HERO, RN Outcome: Progressing   Problem: Health Behavior/Discharge Planning: Goal: Ability to manage health-related needs will improve 04/13/2024 1830 by Russell Elspeth HERO, RN Outcome: Adequate for Discharge 04/13/2024 1047 by Russell Elspeth HERO, RN Outcome: Progressing   Problem: Clinical Measurements: Goal: Ability to maintain clinical measurements within normal limits will improve 04/13/2024 1830 by Russell Elspeth HERO, RN Outcome: Adequate for Discharge 04/13/2024 1047 by Russell Elspeth HERO, RN Outcome: Progressing Goal: Will remain free from infection 04/13/2024 1830 by Russell Elspeth HERO, RN Outcome: Adequate for Discharge 04/13/2024 1047 by Russell Elspeth HERO, RN Outcome: Progressing Goal: Diagnostic test results will improve 04/13/2024 1830 by Russell Elspeth HERO, RN Outcome: Adequate for Discharge 04/13/2024 1047 by Russell Elspeth HERO, RN Outcome: Progressing Goal: Respiratory complications will improve 04/13/2024 1830 by Russell Elspeth HERO, RN Outcome: Adequate for Discharge 04/13/2024 1047 by Russell Elspeth HERO, RN Outcome: Progressing Goal: Cardiovascular complication will be avoided 04/13/2024 1830 by Russell Elspeth HERO, RN Outcome: Adequate for Discharge 04/13/2024 1047 by Russell Elspeth HERO, RN Outcome: Progressing   Problem: Activity: Goal: Risk for activity intolerance will decrease 04/13/2024 1830 by Russell Elspeth HERO, RN Outcome: Adequate for Discharge 04/13/2024 1047 by Russell Elspeth HERO, RN Outcome: Progressing   Problem: Nutrition: Goal: Adequate nutrition will be maintained 04/13/2024 1830 by Russell Elspeth HERO, RN Outcome:  Adequate for Discharge 04/13/2024 1047 by Russell Elspeth HERO, RN Outcome: Progressing   Problem: Coping: Goal: Level of anxiety will decrease 04/13/2024 1830 by Russell Elspeth HERO, RN Outcome: Adequate for Discharge 04/13/2024 1047 by Russell Elspeth HERO, RN Outcome: Progressing   Problem: Elimination: Goal: Will not experience complications related to bowel motility 04/13/2024 1830 by Russell Elspeth HERO, RN Outcome: Adequate for Discharge 04/13/2024 1047 by Russell Elspeth HERO, RN Outcome: Progressing Goal: Will not experience complications related to urinary retention 04/13/2024 1830 by Russell Elspeth HERO, RN Outcome: Adequate for Discharge 04/13/2024 1047 by Russell Elspeth HERO, RN Outcome: Progressing   Problem: Pain Managment: Goal: General experience of comfort will improve and/or be controlled 04/13/2024 1830 by Russell Elspeth HERO, RN Outcome: Adequate for Discharge 04/13/2024 1047 by Russell Elspeth HERO, RN Outcome: Progressing   Problem: Safety: Goal: Ability to remain free from injury will improve 04/13/2024 1830 by Russell Elspeth HERO, RN Outcome: Adequate for Discharge 04/13/2024 1047 by Russell Elspeth HERO, RN Outcome: Progressing   Problem: Skin Integrity: Goal: Risk for impaired skin integrity will decrease 04/13/2024 1830 by Russell Elspeth HERO, RN Outcome: Adequate for Discharge 04/13/2024 1047 by Russell Elspeth HERO, RN Outcome: Progressing   Problem: Education: Goal: Knowledge of disease or condition will improve 04/13/2024 1830 by Russell Elspeth HERO, RN Outcome: Adequate for Discharge 04/13/2024 1047 by Russell Elspeth HERO, RN Outcome: Progressing Goal: Knowledge of the prescribed therapeutic regimen will improve 04/13/2024 1830 by Russell Elspeth HERO, RN Outcome: Adequate for Discharge 04/13/2024 1047 by Russell Elspeth HERO, RN Outcome: Progressing Goal: Individualized Educational Video(s) 04/13/2024 1830 by Russell Elspeth HERO,  RN Outcome: Adequate for Discharge 04/13/2024 1047 by Russell Elspeth HERO, RN Outcome: Progressing   Problem: Activity: Goal: Ability to tolerate increased activity will improve 04/13/2024 1830 by Russell Elspeth HERO, RN Outcome: Adequate for Discharge 04/13/2024 1047 by Russell Elspeth HERO, RN Outcome: Progressing Goal: Will verbalize the importance of balancing activity with adequate rest periods 04/13/2024  1830 by Russell Elspeth HERO, RN Outcome: Adequate for Discharge 04/13/2024 1047 by Russell Elspeth HERO, RN Outcome: Progressing   Problem: Respiratory: Goal: Ability to maintain a clear airway will improve 04/13/2024 1830 by Russell Elspeth HERO, RN Outcome: Adequate for Discharge 04/13/2024 1047 by Russell Elspeth HERO, RN Outcome: Progressing Goal: Levels of oxygenation will improve 04/13/2024 1830 by Russell Elspeth HERO, RN Outcome: Adequate for Discharge 04/13/2024 1047 by Russell Elspeth HERO, RN Outcome: Progressing Goal: Ability to maintain adequate ventilation will improve 04/13/2024 1830 by Russell Elspeth HERO, RN Outcome: Adequate for Discharge 04/13/2024 1047 by Russell Elspeth HERO, RN Outcome: Progressing

## 2024-04-22 DIAGNOSIS — Z952 Presence of prosthetic heart valve: Secondary | ICD-10-CM | POA: Diagnosis not present

## 2024-05-04 ENCOUNTER — Ambulatory Visit (HOSPITAL_COMMUNITY)
Admission: RE | Admit: 2024-05-04 | Discharge: 2024-05-04 | Disposition: A | Discharge status: Home / Self Care | Source: Ambulatory Visit | Attending: Internal Medicine | Admitting: Internal Medicine

## 2024-05-04 ENCOUNTER — Inpatient Hospital Stay: Payer: BC Managed Care – PPO | Attending: Internal Medicine

## 2024-05-04 DIAGNOSIS — Z9226 Personal history of immune checkpoint inhibitor therapy: Secondary | ICD-10-CM | POA: Diagnosis not present

## 2024-05-04 DIAGNOSIS — R911 Solitary pulmonary nodule: Secondary | ICD-10-CM | POA: Diagnosis not present

## 2024-05-04 DIAGNOSIS — Z79890 Hormone replacement therapy (postmenopausal): Secondary | ICD-10-CM | POA: Diagnosis not present

## 2024-05-04 DIAGNOSIS — R918 Other nonspecific abnormal finding of lung field: Secondary | ICD-10-CM | POA: Diagnosis not present

## 2024-05-04 DIAGNOSIS — Z905 Acquired absence of kidney: Secondary | ICD-10-CM | POA: Insufficient documentation

## 2024-05-04 DIAGNOSIS — I700 Atherosclerosis of aorta: Secondary | ICD-10-CM | POA: Diagnosis not present

## 2024-05-04 DIAGNOSIS — C642 Malignant neoplasm of left kidney, except renal pelvis: Secondary | ICD-10-CM | POA: Insufficient documentation

## 2024-05-04 DIAGNOSIS — E039 Hypothyroidism, unspecified: Secondary | ICD-10-CM | POA: Diagnosis not present

## 2024-05-04 LAB — CMP (CANCER CENTER ONLY)
ALT: 20 U/L (ref 0–44)
AST: 14 U/L — ABNORMAL LOW (ref 15–41)
Albumin: 4.6 g/dL (ref 3.5–5.0)
Alkaline Phosphatase: 71 U/L (ref 38–126)
Anion gap: 6 (ref 5–15)
BUN: 22 mg/dL — ABNORMAL HIGH (ref 6–20)
CO2: 29 mmol/L (ref 22–32)
Calcium: 9.5 mg/dL (ref 8.9–10.3)
Chloride: 102 mmol/L (ref 98–111)
Creatinine: 1.24 mg/dL (ref 0.61–1.24)
GFR, Estimated: >60 mL/min (ref >60)
Glucose, Bld: 115 mg/dL — ABNORMAL HIGH (ref 70–99)
Potassium: 5.2 mmol/L — ABNORMAL HIGH (ref 3.5–5.1)
Sodium: 137 mmol/L (ref 135–145)
Total Bilirubin: 0.4 mg/dL (ref 0.0–1.2)
Total Protein: 7.4 g/dL (ref 6.5–8.1)

## 2024-05-04 LAB — CBC WITH DIFFERENTIAL (CANCER CENTER ONLY)
Abs Immature Granulocytes: 0.02 K/uL (ref 0.00–0.07)
Basophils Absolute: 0 K/uL (ref 0.0–0.1)
Basophils Relative: 0 %
Eosinophils Absolute: 0.1 K/uL (ref 0.0–0.5)
Eosinophils Relative: 1 %
HCT: 45.1 % (ref 39.0–52.0)
Hemoglobin: 14.5 g/dL (ref 13.0–17.0)
Immature Granulocytes: 0 %
Lymphocytes Relative: 16 %
Lymphs Abs: 1.8 K/uL (ref 0.7–4.0)
MCH: 27 pg (ref 26.0–34.0)
MCHC: 32.2 g/dL (ref 30.0–36.0)
MCV: 84 fL (ref 80.0–100.0)
Monocytes Absolute: 0.7 K/uL (ref 0.1–1.0)
Monocytes Relative: 6 %
Neutro Abs: 8.7 K/uL — ABNORMAL HIGH (ref 1.7–7.7)
Neutrophils Relative %: 77 %
Platelet Count: 205 K/uL (ref 150–400)
RBC: 5.37 MIL/uL (ref 4.22–5.81)
RDW: 15.7 % — ABNORMAL HIGH (ref 11.5–15.5)
WBC Count: 11.4 K/uL — ABNORMAL HIGH (ref 4.0–10.5)
nRBC: 0 % (ref 0.0–0.2)

## 2024-05-06 DIAGNOSIS — R0902 Hypoxemia: Secondary | ICD-10-CM | POA: Diagnosis not present

## 2024-05-07 ENCOUNTER — Other Ambulatory Visit: Payer: Self-pay

## 2024-05-08 DIAGNOSIS — F411 Generalized anxiety disorder: Secondary | ICD-10-CM | POA: Diagnosis not present

## 2024-05-08 DIAGNOSIS — F331 Major depressive disorder, recurrent, moderate: Secondary | ICD-10-CM | POA: Diagnosis not present

## 2024-05-08 DIAGNOSIS — Z5181 Encounter for therapeutic drug level monitoring: Secondary | ICD-10-CM | POA: Diagnosis not present

## 2024-05-11 ENCOUNTER — Ambulatory Visit: Payer: BC Managed Care – PPO | Admitting: Internal Medicine

## 2024-05-19 DIAGNOSIS — Z125 Encounter for screening for malignant neoplasm of prostate: Secondary | ICD-10-CM | POA: Diagnosis not present

## 2024-05-19 DIAGNOSIS — Z1212 Encounter for screening for malignant neoplasm of rectum: Secondary | ICD-10-CM | POA: Diagnosis not present

## 2024-05-19 DIAGNOSIS — D649 Anemia, unspecified: Secondary | ICD-10-CM | POA: Diagnosis not present

## 2024-05-19 DIAGNOSIS — E032 Hypothyroidism due to medicaments and other exogenous substances: Secondary | ICD-10-CM | POA: Diagnosis not present

## 2024-05-19 DIAGNOSIS — E1165 Type 2 diabetes mellitus with hyperglycemia: Secondary | ICD-10-CM | POA: Diagnosis not present

## 2024-05-19 DIAGNOSIS — E785 Hyperlipidemia, unspecified: Secondary | ICD-10-CM | POA: Diagnosis not present

## 2024-05-20 ENCOUNTER — Inpatient Hospital Stay (HOSPITAL_BASED_OUTPATIENT_CLINIC_OR_DEPARTMENT_OTHER): Admitting: Internal Medicine

## 2024-05-20 VITALS — BP 148/77 | HR 87 | Temp 97.0°F | Resp 17 | Ht 70.0 in | Wt 236.2 lb

## 2024-05-20 DIAGNOSIS — C642 Malignant neoplasm of left kidney, except renal pelvis: Secondary | ICD-10-CM

## 2024-05-20 DIAGNOSIS — Z9226 Personal history of immune checkpoint inhibitor therapy: Secondary | ICD-10-CM | POA: Diagnosis not present

## 2024-05-20 DIAGNOSIS — R911 Solitary pulmonary nodule: Secondary | ICD-10-CM | POA: Diagnosis not present

## 2024-05-20 DIAGNOSIS — Z905 Acquired absence of kidney: Secondary | ICD-10-CM | POA: Diagnosis not present

## 2024-05-20 DIAGNOSIS — Z79890 Hormone replacement therapy (postmenopausal): Secondary | ICD-10-CM | POA: Diagnosis not present

## 2024-05-20 DIAGNOSIS — E039 Hypothyroidism, unspecified: Secondary | ICD-10-CM | POA: Diagnosis not present

## 2024-05-20 NOTE — Progress Notes (Signed)
 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  PRIOR THERAPY:  1) Status post left laparoscopic radical nephrectomy on 03/12/2022 under the care of Dr. Carolee and the final pathology showed clear-cell renal cell carcinoma nuclear grade 4 measuring 7.0 cm with tumor extension into the renal vein and renal sinus. 2) Adjuvant immunotherapy with Keytruda  200 Mg IV every 3 weeks status post 17 cycles.  CURRENT THERAPY: Observation.  INTERVAL HISTORY: Russell Burns 58 y.o. male returns to the clinic today for follow-up visit.Discussed the use of AI scribe software for clinical note transcription with the patient, who gave verbal consent to proceed.  History of Present Illness Russell Burns is a 58 year old male who presents for evaluation with a repeat CT scan for restaging of his disease.  He feels 'okay' but is worried about the nodules. A previous CT scan showed a small nodule in the right upper lobe, apical part, which increased from six millimeters to nine millimeters. A CT angiogram in July did not show this nodule.  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 He feels better than he has in ten years and is regaining his stamina after completing cardiac rehab. The surgery was performed on a Tuesday, and he was discharged by Sunday.  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 He is concerned about the lung nodules and their potential implications.      MEDICAL HISTORY: Past Medical History:  Diagnosis Date   Allergy    Aortic stenosis    Arthritis    Blood transfusion without reported  diagnosis    Cancer (HCC)    Chronic kidney disease    Diabetes mellitus without complication (HCC)    FH: thoracic aneurysm    Hyperlipidemia    Hypertension    Obesity     ALLERGIES:  is allergic to bee venom and cat dander.  MEDICATIONS:  Current Outpatient Medications  Medication Sig Dispense Refill   acetaminophen  (TYLENOL ) 500 MG tablet Take 1,000 mg by mouth in the morning.     albuterol  (PROVENTIL ) (2.5 MG/3ML) 0.083% nebulizer solution Take 3 mLs (2.5 mg total) by nebulization every 4 (four) hours as needed for wheezing or shortness of breath. 75 mL 5   albuterol  (VENTOLIN  HFA) 108 (90 Base) MCG/ACT inhaler Inhale 2 puffs into the lungs every 6 (six) hours as needed for wheezing or shortness of breath.     amLODipine  (NORVASC ) 10 MG tablet Take 10 mg by mouth daily.     aspirin  81 MG chewable tablet Chew 81 mg by mouth daily.     atorvastatin  (LIPITOR ) 80 MG tablet Take 80 mg by mouth daily.     Budeson-Glycopyrrol-Formoterol  (BREZTRI  AEROSPHERE) 160-9-4.8 MCG/ACT AERO Inhale 2 puffs into the lungs 2 (two) times daily. Take 2 puffs first thing in am and then another 2 puffs about 12 hours later. (Patient taking differently: Inhale 2 puffs into the lungs 2 (two) times daily.) 10.7 g 11   EPINEPHrine  0.3 mg/0.3 mL IJ SOAJ injection Inject 0.3 mg into the muscle as needed for anaphylaxis.     ezetimibe  (ZETIA ) 10 MG tablet  Take 10 mg by mouth daily.     famotidine  (PEPCID ) 20 MG tablet TAKE 1 TABLET BY MOUTH EVERY DAY AFTER SUPPER (Patient taking differently: Take 20 mg by mouth daily.) 90 tablet 0   fluticasone  (FLONASE ) 50 MCG/ACT nasal spray Place 2 sprays into both nostrils in the morning and at bedtime.     guaiFENesin  (MUCINEX ) 600 MG 12 hr tablet Take 1,200 mg by mouth 2 (two) times daily.     ipratropium-albuterol  (DUONEB) 0.5-2.5 (3) MG/3ML SOLN Take 3 mLs by nebulization daily as needed (shortness of breath or wheezing).     JARDIANCE  25 MG TABS tablet Take 25 mg by mouth  in the morning.     levothyroxine  (SYNTHROID ) 25 MCG tablet Take 1 tablet (25 mcg total) by mouth daily before breakfast. 30 tablet 2   losartan  (COZAAR ) 25 MG tablet Take 25 mg by mouth daily.     metFORMIN  (GLUCOPHAGE ) 1000 MG tablet Take 1 tablet (1,000 mg total) by mouth in the morning and at bedtime.     REPATHA 140 MG/ML SOSY Inject 140 mg into the skin every 14 (fourteen) days.     tamsulosin  (FLOMAX ) 0.4 MG CAPS capsule Take 1 capsule (0.4 mg total) by mouth daily. 14 capsule 0   tirzepatide  (MOUNJARO ) 10 MG/0.5ML Pen Inject 10 mg into the skin once a week. (Patient taking differently: Inject 10 mg into the skin every Friday.) 6 mL 3   benzonatate  (TESSALON ) 200 MG capsule Take 1 capsule (200 mg total) by mouth 3 (three) times daily as needed. (Patient taking differently: Take 200 mg by mouth daily as needed for cough.) 20 capsule 0   No current facility-administered medications for this visit.    SURGICAL HISTORY:  Past Surgical History:  Procedure Laterality Date   CYSTOSCOPY WITH URETEROSCOPY AND STENT PLACEMENT Left 02/12/2022   Procedure: CYSTOSCOPY WITH LEFT RETROGRADE PYELOGRAM, DIAGNOSTIC URETEROSCOPY AND STENT PLACEMENT;  Surgeon: Carolee Sherwood JONETTA DOUGLAS, MD;  Location: [facility];  Service: Urology;  Laterality: Left;   LAPAROSCOPIC NEPHRECTOMY, HAND ASSISTED Left 03/12/2022   Procedure: LEFT HAND ASSISTED LAPAROSCOPIC NEPHRECTOMY;  Surgeon: Carolee Sherwood JONETTA DOUGLAS, MD;  Location: [facility];  Service: Urology;  Laterality: Left;  NEED 2.5 HRS FOR THIS CASE   RIGHT HEART CATH AND CORONARY ANGIOGRAPHY N/A 11/19/2023   Procedure: RIGHT HEART CATH AND CORONARY ANGIOGRAPHY;  Surgeon: Zenaida Morene PARAS, MD;  Location: [facility];  Service: Cardiovascular;  Laterality: N/A;   TONSILLECTOMY     WISDOM TOOTH EXTRACTION      REVIEW OF SYSTEMS:  Constitutional: negative Eyes: negative Ears, nose, mouth, throat, and face: negative Respiratory: negative Cardiovascular:  negative Gastrointestinal: negative Genitourinary:negative Integument/breast: negative Hematologic/lymphatic: negative Musculoskeletal:negative Neurological: negative Behavioral/Psych: negative Endocrine: negative Allergic/Immunologic: negative   PHYSICAL EXAMINATION: General appearance: alert, cooperative, and no distress Head: Normocephalic, without obvious abnormality, atraumatic Neck: no adenopathy, no JVD, supple, symmetrical, trachea midline, and thyroid  not enlarged, symmetric, no tenderness/mass/nodules Lymph nodes: Cervical, supraclavicular, and axillary nodes normal. Resp: clear to auscultation bilaterally Back: symmetric, no curvature. ROM normal. No CVA tenderness. Cardio: regular rate and rhythm, S1, S2 normal, no murmur, click, rub or gallop GI: soft, non-tender; bowel sounds normal; no masses,  no organomegaly Extremities: extremities normal, atraumatic, no cyanosis or edema Neurologic: Alert and oriented X 3, normal strength and tone. Normal symmetric reflexes. Normal coordination and gait  ECOG PERFORMANCE STATUS: 0 - Asymptomatic  Blood pressure (!) 148/77, pulse 87, temperature (!) 97 F (36.1 C), resp. rate  17, height 5' 10 (1.778 m), weight 236 lb 3.2 oz (107.1 kg), SpO2 98%.  LABORATORY DATA: Lab Results  Component Value Date   WBC 11.4 (H) 05/04/2024   HGB 14.5 05/04/2024   HCT 45.1 05/04/2024   MCV 84.0 05/04/2024   PLT 205 05/04/2024      Chemistry      Component Value Date/Time   NA 137 05/04/2024 0842   NA 139 07/25/2022 0935   K 5.2 (H) 05/04/2024 0842   CL 102 05/04/2024 0842   CO2 29 05/04/2024 0842   BUN 22 (H) 05/04/2024 0842   BUN 23 07/25/2022 0935   CREATININE 1.24 05/04/2024 0842      Component Value Date/Time   CALCIUM  9.5 05/04/2024 0842   ALKPHOS 71 05/04/2024 0842   AST 14 (L) 05/04/2024 0842   ALT 20 05/04/2024 0842   BILITOT 0.4 05/04/2024 0842       RADIOGRAPHIC STUDIES: CT CHEST ABDOMEN PELVIS WO CONTRAST Result  Date: 05/16/2024 CLINICAL DATA:  Renal cell carcinoma restaging status post left nephrectomy * Tracking Code: BO * EXAM: CT CHEST, ABDOMEN AND PELVIS WITHOUT CONTRAST TECHNIQUE: Multidetector CT imaging of the chest, abdomen and pelvis was performed following the standard protocol without IV contrast. RADIATION DOSE REDUCTION: This exam was performed according to the departmental dose-optimization program which includes automated exposure control, adjustment of the mA and/or kV according to patient size and/or use of iterative reconstruction technique. 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 Normal heart size. Left coronary artery calcifications. No pericardial effusion. Mediastinum/Nodes: No enlarged mediastinal, hilar, or axillary lymph nodes. Thyroid  gland, trachea, and esophagus demonstrate no significant findings. Lungs/Pleura: Slowly enlarging nodule of the right apex measuring 0.9 x 0.7 cm, new on examination dated 11/06/2023 and at that time measuring 0.6 cm (series 4, image 38). Additional tiny nodules unchanged, several of which are calcified and definitively benign. Diffuse bilateral bronchial wall thickening. No pleural effusion or pneumothorax. Musculoskeletal: No chest wall abnormality. No acute osseous findings. CT ABDOMEN PELVIS FINDINGS Hepatobiliary: No solid liver abnormality is seen. No gallstones, gallbladder wall thickening, or biliary dilatation. Pancreas: Unremarkable. No pancreatic ductal dilatation or surrounding inflammatory changes. Spleen: Normal in size without significant abnormality. Adrenals/Urinary Tract: Adrenal glands are unremarkable. Left nephrectomy. Right kidney is normal in noncontrast appearance, without renal calculi, obvious solid lesion, or hydronephrosis. Bladder is unremarkable. Stomach/Bowel: Stomach is within normal limits. Appendix appears normal. No evidence of bowel wall thickening,  distention, or inflammatory changes. Vascular/Lymphatic: Aortic atherosclerosis. No enlarged abdominal or pelvic lymph nodes. Reproductive: No mass or other abnormality. Other: No abdominal wall hernia or abnormality. No ascites. Musculoskeletal: No acute osseous findings. IMPRESSION: 1. Slowly enlarging nodule of the right apex measuring 0.9 x 0.7 cm, new on examination dated 11/06/2023 and at that time measuring 0.6 cm. This is concerning for a small pulmonary metastasis. Given size and apical location, there is a good chance this could be characterized by PET-CT. 2. No other noncontrast evidence of metastatic disease in the chest, abdomen, or pelvis. 3. Left nephrectomy. 4. Coronary artery disease. Aortic Atherosclerosis (ICD10-I70.0). Electronically Signed   By: Marolyn JONETTA Jaksch M.D.   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  He is status post left laparoscopic radical nephrectomy on 03/12/2022 under the care  of Dr. Carolee and the final pathology showed clear-cell renal cell carcinoma nuclear grade 4 measuring 7.0 cm with tumor extension into the renal vein and renal sinus. The patient underwent treatment with Adjuvant immunotherapy with Keytruda  200 Mg IV every 3 weeks status post 17 cycles. The patient is currently on observation and he is feeling fine. He had repeat CT scan of the chest, abdomen and pelvis performed recently.  I personally and independently reviewed the scan images and discussed the results with the patient today.  There is a slowly enlarging nodule in the right apex measuring 0.9 x 0.7 cm new on the scan of February 2025. Assessment and Plan Assessment & Plan Right upper lobe pulmonary nodule, increasing in size The right upper lobe pulmonary nodule has increased from 6 mm to 9 mm, located in the apical part of  the right upper lobe. It was not visible on a CT angiogram in July, which is unusual. Differential diagnosis includes malignancy versus benign causes such as inflammation. The nodule is approaching the size threshold for PET scan detection. - Order PET scan of the chest to assess nodule activity. - Schedule follow-up appointment in a few weeks to discuss PET scan results. - Consider radiation therapy if PET scan is positive for malignancy. For the hypothyroidism, he will continue his current treatment with levothyroxine . The patient was advised to call immediately if he has any concerning symptoms in the interval. The patient voices understanding of current disease status and treatment options and is in agreement with the current care plan.  All questions were answered. The patient knows to call the clinic with any problems, questions or concerns. We can certainly see the patient much sooner if necessary.  The total time spent in the appointment was 30 minutes.  Disclaimer: This note was dictated with voice recognition software. Similar sounding words can inadvertently be transcribed and may not be corrected upon review.

## 2024-05-21 ENCOUNTER — Other Ambulatory Visit: Payer: Self-pay

## 2024-05-21 ENCOUNTER — Telehealth: Payer: Self-pay | Admitting: Internal Medicine

## 2024-05-21 NOTE — Telephone Encounter (Signed)
 Scheduled appointment per 8/27 los. Talked with the patient and he is aware of the made appointment.

## 2024-05-22 ENCOUNTER — Other Ambulatory Visit: Payer: Self-pay

## 2024-05-26 DIAGNOSIS — C642 Malignant neoplasm of left kidney, except renal pelvis: Secondary | ICD-10-CM | POA: Diagnosis not present

## 2024-05-26 DIAGNOSIS — Z1331 Encounter for screening for depression: Secondary | ICD-10-CM | POA: Diagnosis not present

## 2024-05-26 DIAGNOSIS — J4540 Moderate persistent asthma, uncomplicated: Secondary | ICD-10-CM | POA: Diagnosis not present

## 2024-05-26 DIAGNOSIS — J432 Centrilobular emphysema: Secondary | ICD-10-CM | POA: Diagnosis not present

## 2024-05-26 DIAGNOSIS — R911 Solitary pulmonary nodule: Secondary | ICD-10-CM | POA: Diagnosis not present

## 2024-05-26 DIAGNOSIS — R82998 Other abnormal findings in urine: Secondary | ICD-10-CM | POA: Diagnosis not present

## 2024-05-26 DIAGNOSIS — Z23 Encounter for immunization: Secondary | ICD-10-CM | POA: Diagnosis not present

## 2024-05-26 DIAGNOSIS — Z0000 Encounter for general adult medical examination without abnormal findings: Secondary | ICD-10-CM | POA: Diagnosis not present

## 2024-05-28 DIAGNOSIS — F331 Major depressive disorder, recurrent, moderate: Secondary | ICD-10-CM | POA: Diagnosis not present

## 2024-05-28 DIAGNOSIS — F411 Generalized anxiety disorder: Secondary | ICD-10-CM | POA: Diagnosis not present

## 2024-05-29 ENCOUNTER — Ambulatory Visit (HOSPITAL_COMMUNITY)
Admission: RE | Admit: 2024-05-29 | Discharge: 2024-05-29 | Disposition: A | Discharge status: Home / Self Care | Source: Ambulatory Visit | Attending: Internal Medicine | Admitting: Internal Medicine

## 2024-05-29 DIAGNOSIS — C642 Malignant neoplasm of left kidney, except renal pelvis: Secondary | ICD-10-CM | POA: Insufficient documentation

## 2024-05-29 DIAGNOSIS — R911 Solitary pulmonary nodule: Secondary | ICD-10-CM | POA: Diagnosis not present

## 2024-05-29 LAB — GLUCOSE, CAPILLARY: Glucose-Capillary: 123 mg/dL — ABNORMAL HIGH (ref 70–99)

## 2024-05-29 MED ORDER — FLUDEOXYGLUCOSE F - 18 (FDG) INJECTION
11.5900 | Freq: Once | INTRAVENOUS | Status: AC
  Administered 2024-05-29: 11.59 via INTRAVENOUS

## 2024-06-03 ENCOUNTER — Other Ambulatory Visit: Payer: Self-pay | Admitting: Internal Medicine

## 2024-06-03 DIAGNOSIS — R053 Chronic cough: Secondary | ICD-10-CM

## 2024-06-06 DIAGNOSIS — R0902 Hypoxemia: Secondary | ICD-10-CM | POA: Diagnosis not present

## 2024-06-10 ENCOUNTER — Inpatient Hospital Stay: Attending: Internal Medicine | Admitting: Internal Medicine

## 2024-06-10 VITALS — BP 143/86 | HR 75 | Temp 96.9°F | Resp 17 | Ht 70.0 in | Wt 237.5 lb

## 2024-06-10 DIAGNOSIS — Z905 Acquired absence of kidney: Secondary | ICD-10-CM | POA: Diagnosis not present

## 2024-06-10 DIAGNOSIS — Z9221 Personal history of antineoplastic chemotherapy: Secondary | ICD-10-CM | POA: Diagnosis not present

## 2024-06-10 DIAGNOSIS — R911 Solitary pulmonary nodule: Secondary | ICD-10-CM | POA: Diagnosis not present

## 2024-06-10 DIAGNOSIS — C642 Malignant neoplasm of left kidney, except renal pelvis: Secondary | ICD-10-CM | POA: Diagnosis not present

## 2024-06-10 NOTE — Progress Notes (Signed)
 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  PRIOR THERAPY:  1) Status post left laparoscopic radical nephrectomy on 03/12/2022 under the care of Dr. Carolee and the final pathology showed clear-cell renal cell carcinoma nuclear grade 4 measuring 7.0 cm with tumor extension into the renal vein and renal sinus. 2) Adjuvant immunotherapy with Keytruda  200 Mg IV every 3 weeks status post 17 cycles.  CURRENT THERAPY: Observation.  INTERVAL HISTORY: Russell Burns 58 y.o. male returns to the clinic today for follow-up visit.  Discussed the use of AI scribe software for clinical note transcription with the patient, who gave verbal consent to proceed.  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 He is currently in an observation period.  A previous CT scan of the chest, abdomen, and pelvis revealed a slowly enlarging nodule in the right apex measuring 0.9 by 0.7 cm. A recent PET scan was performed to evaluate this nodule. He has no complaints today except for anxiety about the scan results.  He was accompanied by his wife.     MEDICAL HISTORY: Past Medical History:  Diagnosis Date   Allergy    Aortic stenosis    Arthritis    Blood transfusion without reported diagnosis    Cancer (HCC)    Chronic kidney disease    Diabetes mellitus without complication (HCC)    FH: thoracic aneurysm    Hyperlipidemia    Hypertension    Obesity      ALLERGIES:  is allergic to bee venom and cat dander.  MEDICATIONS:  Current Outpatient Medications  Medication Sig Dispense Refill   acetaminophen  (TYLENOL ) 500 MG tablet Take 1,000 mg by mouth in the morning.     albuterol  (PROVENTIL ) (2.5 MG/3ML) 0.083% nebulizer solution Take 3 mLs (2.5 mg total) by nebulization every 4 (four) hours as needed for wheezing or shortness of breath. 75 mL 5   albuterol  (VENTOLIN  HFA) 108 (90 Base) MCG/ACT inhaler Inhale 2 puffs into the lungs every 6 (six) hours as needed for wheezing or shortness of breath.     amLODipine  (NORVASC ) 10 MG tablet Take 10 mg by mouth daily.     aspirin  81 MG chewable tablet Chew 81 mg by mouth daily.     atorvastatin  (LIPITOR ) 80 MG tablet Take 80 mg by mouth daily.     benzonatate  (TESSALON ) 200 MG capsule Take 1 capsule (200 mg total) by mouth 3 (three) times daily as needed. (Patient taking differently: Take 200 mg by mouth daily as needed for cough.) 20 capsule 0   Budeson-Glycopyrrol-Formoterol  (BREZTRI  AEROSPHERE) 160-9-4.8 MCG/ACT AERO Inhale 2 puffs into the lungs 2 (two) times daily. Take 2 puffs first thing in am and then another 2 puffs about 12 hours later. (Patient taking differently: Inhale 2 puffs into the lungs 2 (two) times daily.) 10.7 g 11   EPINEPHrine  0.3 mg/0.3 mL IJ SOAJ injection Inject 0.3 mg into the muscle as needed for anaphylaxis.  ezetimibe  (ZETIA ) 10 MG tablet Take 10 mg by mouth daily.     famotidine  (PEPCID ) 20 MG tablet Take 1 tablet (20 mg total) by mouth daily. 90 tablet 0   fluticasone  (FLONASE ) 50 MCG/ACT nasal spray Place 2 sprays into both nostrils in the morning and at bedtime.     guaiFENesin  (MUCINEX ) 600 MG 12 hr tablet Take 1,200 mg by mouth 2 (two) times daily.     ipratropium-albuterol  (DUONEB) 0.5-2.5 (3) MG/3ML SOLN Take 3 mLs by nebulization daily as needed (shortness of breath or wheezing).     JARDIANCE  25 MG TABS tablet Take 25 mg by mouth in the morning.      levothyroxine  (SYNTHROID ) 25 MCG tablet Take 1 tablet (25 mcg total) by mouth daily before breakfast. 30 tablet 2   losartan  (COZAAR ) 25 MG tablet Take 25 mg by mouth daily.     metFORMIN  (GLUCOPHAGE ) 1000 MG tablet Take 1 tablet (1,000 mg total) by mouth in the morning and at bedtime.     REPATHA 140 MG/ML SOSY Inject 140 mg into the skin every 14 (fourteen) days.     tamsulosin  (FLOMAX ) 0.4 MG CAPS capsule Take 1 capsule (0.4 mg total) by mouth daily. 14 capsule 0   tirzepatide  (MOUNJARO ) 10 MG/0.5ML Pen Inject 10 mg into the skin once a week. (Patient taking differently: Inject 10 mg into the skin every Friday.) 6 mL 3   No current facility-administered medications for this visit.    SURGICAL HISTORY:  Past Surgical History:  Procedure Laterality Date   CYSTOSCOPY WITH URETEROSCOPY AND STENT PLACEMENT Left 02/12/2022   Procedure: CYSTOSCOPY WITH LEFT RETROGRADE PYELOGRAM, DIAGNOSTIC URETEROSCOPY AND STENT PLACEMENT;  Surgeon: Carolee Sherwood JONETTA DOUGLAS, MD;  Location: [facility];  Service: Urology;  Laterality: Left;   LAPAROSCOPIC NEPHRECTOMY, HAND ASSISTED Left 03/12/2022   Procedure: LEFT HAND ASSISTED LAPAROSCOPIC NEPHRECTOMY;  Surgeon: Carolee Sherwood JONETTA DOUGLAS, MD;  Location: [facility];  Service: Urology;  Laterality: Left;  NEED 2.5 HRS FOR THIS CASE   RIGHT HEART CATH AND CORONARY ANGIOGRAPHY N/A 11/19/2023   Procedure: RIGHT HEART CATH AND CORONARY ANGIOGRAPHY;  Surgeon: Zenaida Morene PARAS, MD;  Location: [facility];  Service: Cardiovascular;  Laterality: N/A;   TONSILLECTOMY     WISDOM TOOTH EXTRACTION      REVIEW OF SYSTEMS:  Constitutional: negative Eyes: negative Ears, nose, mouth, throat, and face: negative Respiratory: negative Cardiovascular: negative Gastrointestinal: negative Genitourinary:negative Integument/breast: negative Hematologic/lymphatic: negative Musculoskeletal:negative Neurological: negative Behavioral/Psych: negative Endocrine: negative Allergic/Immunologic:  negative   PHYSICAL EXAMINATION: General appearance: alert, cooperative, and no distress Head: Normocephalic, without obvious abnormality, atraumatic Neck: no adenopathy, no JVD, supple, symmetrical, trachea midline, and thyroid  not enlarged, symmetric, no tenderness/mass/nodules Lymph nodes: Cervical, supraclavicular, and axillary nodes normal. Resp: clear to auscultation bilaterally Back: symmetric, no curvature. ROM normal. No CVA tenderness. Cardio: regular rate and rhythm, S1, S2 normal, no murmur, click, rub or gallop GI: soft, non-tender; bowel sounds normal; no masses,  no organomegaly Extremities: extremities normal, atraumatic, no cyanosis or edema Neurologic: Alert and oriented X 3, normal strength and tone. Normal symmetric reflexes. Normal coordination and gait  ECOG PERFORMANCE STATUS: 0 - Asymptomatic  Blood pressure (!) 143/86, pulse 75, temperature (!) 96.9 F (36.1 C), resp. rate 17, height 5' 10 (1.778 m), weight 237 lb 8 oz (107.7 kg), SpO2 98%.  LABORATORY DATA: Lab Results  Component Value Date   WBC 11.4 (H) 05/04/2024   HGB 14.5 05/04/2024   HCT 45.1 05/04/2024  MCV 84.0 05/04/2024   PLT 205 05/04/2024      Chemistry      Component Value Date/Time   NA 137 05/04/2024 0842   NA 139 07/25/2022 0935   K 5.2 (H) 05/04/2024 0842   CL 102 05/04/2024 0842   CO2 29 05/04/2024 0842   BUN 22 (H) 05/04/2024 0842   BUN 23 07/25/2022 0935   CREATININE 1.24 05/04/2024 0842      Component Value Date/Time   CALCIUM  9.5 05/04/2024 0842   ALKPHOS 71 05/04/2024 0842   AST 14 (L) 05/04/2024 0842   ALT 20 05/04/2024 0842   BILITOT 0.4 05/04/2024 0842       RADIOGRAPHIC STUDIES: NM PET Image Initial (PI) Skull Base To Thigh (F-18 FDG) Result Date: 06/01/2024 EXAM: PET AND CT SKULL BASE TO MID THIGH 05/29/2024 08:48:24 AM TECHNIQUE: PET imaging was acquired from the base of the skull to the mid thighs. Non-contrast enhanced computed tomography was obtained for  attenuation correction and anatomic localization. RADIOPHARMACEUTICAL: 11.59 mCi F-18 FDG Uptake time 60 minutes. Glucose level 123 mg/dl. COMPARISON: 05/04/2024. CLINICAL HISTORY: Lung nodule, > 8mm. Initial PET for Lung nodule, > 8mm, Malignant neoplasm of left kidney (HCC). FINDINGS: HEAD AND NECK: No metabolically active cervical lymphadenopathy. CHEST: Stable Nodule within the right apex is again noted, measuring 8 mm with SUV max of 1.6 (axial image 67). Dense nodule along the minor fissure measuring 4 mm is unchanged and is too small to characterize by PET CT. This is favored to represent a benign intrapulmonary lymph node. Aortic atherosclerotic calcification. Median sternotomy. Aortic and valve replacement. ABDOMEN AND PELVIS: No abnormal tracer uptake within the liver, pancreas, spleen, or adrenal glands. Status post left nephrectomy. No suspicious nodule or mass within the nephrectomy bed. Sigmoid diverticulosis without signs of acute diverticulitis. Small fat-containing umbilical hernia. BONES AND SOFT TISSUE: No abnormal FDG activity localizes to the bones. No metabolically active aggressive osseous lesion. IMPRESSION: 1. 8 mm nodule within the right apex with SUV max of 1.6, unchanged from prior study. Findings are equivocal for metastatic disease or primary bronchogenic carcinoma. Advised continued interval follow-up 2. Status post left nephrectomy with no suspicious nodule or mass within the nephrectomy bed. Electronically signed by: Waddell Calk MD 06/01/2024 09:14 AM EDT RP Workstation: [id number]    ASSESSMENT AND PLAN: This is a very pleasant 58 years old white male with Stage Burns (T3a, N0, M0) clear-cell renal cell carcinoma without evidence of metastatic disease diagnosed in June 2023.  He is status post left laparoscopic radical nephrectomy on 03/12/2022 under the care of Dr. Carolee and the final pathology showed clear-cell renal cell carcinoma nuclear grade 4 measuring 7.0 cm with tumor  extension into the renal vein and renal sinus. The patient underwent treatment with Adjuvant immunotherapy with Keytruda  200 Mg IV every 3 weeks status post 17 cycles. The patient is currently on observation and he is feeling fine. He had repeat CT scan of the chest, abdomen and pelvis performed recently. There is a slowly enlarging nodule in the right apex measuring 0.9 x 0.7 cm new on the scan of February 2025.  The patient had a PET scan performed recently.  I personally independently reviewed the scan and discussed the result with the patient today.  The 0.8 cm nodule within the right apex has low SUV of 1.6 and this is equivocal for metastatic disease or primary bronchogenic carcinoma and close monitoring was advised. Assessment and Plan Assessment & Plan Stage Burns clear cell  renal cell carcinoma Status post left radical nephrectomy and one year of adjuvant pembrolizumab  therapy. Currently in the surveillance phase with no new concerning findings on recent imaging.  Solitary right upper lobe pulmonary nodule Nodule measuring 0.9 x 0.7 cm, slowly enlarging on previous CT scan. Recent PET scan showed borderline activity, not definitively indicative of malignancy. Differential diagnosis includes potential malignancy versus benign etiology. - Order CT scan of the chest in 4 months to monitor the nodule. - Consider referral to pulmonology for biopsy if the nodule continues to increase in size. - Discuss potential treatment options if biopsy confirms malignancy, including surgical resection or radiation therapy. The patient was advised to call immediately if he has any concerning symptoms in the interval. The patient voices understanding of current disease status and treatment options and is in agreement with the current care plan.  All questions were answered. The patient knows to call the clinic with any problems, questions or concerns. We can certainly see the patient much sooner if  necessary.  The total time spent in the appointment was 30 minutes.  Disclaimer: This note was dictated with voice recognition software. Similar sounding words can inadvertently be transcribed and may not be corrected upon review.

## 2024-06-11 DIAGNOSIS — F331 Major depressive disorder, recurrent, moderate: Secondary | ICD-10-CM | POA: Diagnosis not present

## 2024-06-11 DIAGNOSIS — F411 Generalized anxiety disorder: Secondary | ICD-10-CM | POA: Diagnosis not present

## 2024-06-12 ENCOUNTER — Other Ambulatory Visit: Payer: Self-pay

## 2024-06-17 DIAGNOSIS — I38 Endocarditis, valve unspecified: Secondary | ICD-10-CM | POA: Diagnosis not present

## 2024-06-17 DIAGNOSIS — Z7985 Long-term (current) use of injectable non-insulin antidiabetic drugs: Secondary | ICD-10-CM | POA: Diagnosis not present

## 2024-06-17 DIAGNOSIS — R079 Chest pain, unspecified: Secondary | ICD-10-CM | POA: Diagnosis not present

## 2024-06-17 DIAGNOSIS — I10 Essential (primary) hypertension: Secondary | ICD-10-CM | POA: Diagnosis not present

## 2024-06-17 DIAGNOSIS — R202 Paresthesia of skin: Secondary | ICD-10-CM | POA: Diagnosis not present

## 2024-06-17 DIAGNOSIS — R072 Precordial pain: Secondary | ICD-10-CM | POA: Diagnosis not present

## 2024-06-17 DIAGNOSIS — Z85528 Personal history of other malignant neoplasm of kidney: Secondary | ICD-10-CM | POA: Diagnosis not present

## 2024-06-17 DIAGNOSIS — E785 Hyperlipidemia, unspecified: Secondary | ICD-10-CM | POA: Diagnosis not present

## 2024-06-17 DIAGNOSIS — R0789 Other chest pain: Secondary | ICD-10-CM | POA: Diagnosis not present

## 2024-06-17 DIAGNOSIS — Z79899 Other long term (current) drug therapy: Secondary | ICD-10-CM | POA: Diagnosis not present

## 2024-06-17 DIAGNOSIS — Z87891 Personal history of nicotine dependence: Secondary | ICD-10-CM | POA: Diagnosis not present

## 2024-06-17 DIAGNOSIS — E119 Type 2 diabetes mellitus without complications: Secondary | ICD-10-CM | POA: Diagnosis not present

## 2024-06-17 DIAGNOSIS — R0602 Shortness of breath: Secondary | ICD-10-CM | POA: Diagnosis not present

## 2024-06-17 DIAGNOSIS — Z7984 Long term (current) use of oral hypoglycemic drugs: Secondary | ICD-10-CM | POA: Diagnosis not present

## 2024-06-17 DIAGNOSIS — Z952 Presence of prosthetic heart valve: Secondary | ICD-10-CM | POA: Diagnosis not present

## 2024-06-17 DIAGNOSIS — D72829 Elevated white blood cell count, unspecified: Secondary | ICD-10-CM | POA: Diagnosis not present

## 2024-06-17 DIAGNOSIS — Z7982 Long term (current) use of aspirin: Secondary | ICD-10-CM | POA: Diagnosis not present

## 2024-06-17 DIAGNOSIS — Z905 Acquired absence of kidney: Secondary | ICD-10-CM | POA: Diagnosis not present

## 2024-06-18 DIAGNOSIS — R0789 Other chest pain: Secondary | ICD-10-CM | POA: Diagnosis not present

## 2024-06-18 DIAGNOSIS — I300 Acute nonspecific idiopathic pericarditis: Secondary | ICD-10-CM | POA: Diagnosis not present

## 2024-06-18 DIAGNOSIS — Z952 Presence of prosthetic heart valve: Secondary | ICD-10-CM | POA: Diagnosis not present

## 2024-06-18 DIAGNOSIS — R079 Chest pain, unspecified: Secondary | ICD-10-CM | POA: Diagnosis not present

## 2024-06-18 NOTE — ED Notes (Signed)
 AVS reviewed with pt who verbalized understanding of education provided. Pt denies any additional questions/needs at this time. Pt dressed self, gathered belongings, and is being transported home by spouse.    Rosaline KATHEE Null, RN 06/18/24 [phone]

## 2024-06-18 NOTE — Progress Notes (Signed)
 Cardiovascular Inpatient Consult  PCP: [name], MD Primary Cardiologist: [name], MD Initial Cardiology Consult 9/24:  Dr. [name] Reason for consult:  CP Consult f/u requested 9/25 by ED:  Dr. [name] Reason for consult:  alternative therapy for possible pericarditis d/t pt having solitary kidney   Assessment & Plan:   Possible pericarditis +/- chest wall pain, improved with IV Toradol  x 2 overnight and APAP.  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  Cr 1.28, eGFR 65.  I agree with Dr. [name] that the pericardial findings on CT could be pericarditis or could be post-surgical changes (SAVR in April).  I agree that NSAIDs would be the first-line therapy for both and pt did have some relief with Toradol .  However, due to solitary kidney, both NSAIDs and colchicine would be relatively contraindicated.  Aspirin  could considered - [phone] mg Q 8 h x 1-2 weeks but pt does not think his stomach would tolerate this.  Alternative would be low dose steroids x 1-2 weeks with slow taper - risk of recurrent pericarditis would be higher.  Another alternative would be no therapy - most cases of viral pericarditis are self-limiting w/o sequelae.  A post-surgical pericardiotomy syndrome would likely have occurred earlier and would likely be associated with effusion (no effusion on CT or echo), and is generally associated with fever.  The treatment is the same meds though perhaps longer duration of therapy.  If this seems to be more chest wall pain, then could try APAP. No fever.  Leukocytosis of 12.02 down to 8.84 today.  Pt and ED team have settled on a regimen similar to that which he took to manage his post-SAVR pain:  APAP TID to QID and Naprosyn 200 mg Q D to BID prn.  This is acceptable. Pt has f/u appt with Dr. Ee on 10/19.  He has an appt with PCP next week to check renal function.  Acceptable to discharge pt.  Will sign off.   Please call for questions or problems. Thank you.     HPI / Background / Interim:  From initial Cardiology Consult note by Dr. [name] on 06/17/2024:  Russell Burns is a 58 y.o. male with a past medical history significant for aortic valve disease who presented with a chief complaint of Chest Pain.We have been asked to see him at the kind request of the Emergency Room Provider  for evaluation of  Chest discomfort  The patient states he will woke up from sleep at 4 AM with a feeling of a sharp pain in the left side of his chest.  it is since moved somewhat to the front of his chest.  It hurts worse if he takes a deep breath, he does not feel short of breath aside from that.  The pain eased off somewhat over the last few hours.  He denies a recent fever, sore throat, nausea vomiting diaphoresis.  He has not been having exertional chest discomfort recently.  Dr. 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  His coronary catheterization earlier this year showed entirely normal coronary arteries.  His CT scan suggest some pericardial abnormalities but this could be postoperative findings also.  His pain is reproduced with palpation I think  is reasonable to treat him with nonsteroidals and see if that improves things as long as echocardiograms not show any problems with his aortic valve.  _________________  Possible pericarditis vs chest wall pain.  Chest CT was interpreted as New anterior pericardial thickening and trace pericardial effusion, highly suspicious for pericarditis.   Dr. [name] saw pt yesterday and noted pt said CP worse with deep breath and with palpation.  Treated with Toradol  x 2 doses with improvement/resolution in pain.  Plan was to treat with  NSAIDS but pt has solitary kidney, so ED staff re-consulted today for advice on medical management.    06/18/2024 - [place]: I was asked by secure chat this AM to f/u on Russell Burns because he has one kidney and they did not want to treat with NSAIDs.  I was involved with rounds and a patient with active chest pain and made some preliminary recommendations.  I subsequently reviewed his chart, labs, and echo images. I went to see Russell Burns and he was dressed in street clothes, sitting in a chair at bedside, and had his discharge papers and AVS. Russell Burns states that he awoke yesterday around 3:30 AM with pain in his side which moved to the front of his chest and his back.  It was a pushing pain that occurred while reclining.  He got up and took some baby aspirin .  He took his blood pressure which was sky high so he went ahead and took his BP meds and more aspirin .  The pain continued so he presented to the ED.  He states that it was sharp, heavy, and continuous.  He feels that it has responded well to Toradol  as well as acetaminophen .  The pain is currently 2/10 and if he does not take acetaminophen  increases to 5/10.  No fever, chills, joint pain, shortness of breath, or peripheral edema.  Chest is a little sore to palpation. He is well aware of his medical issues.  He does not want to take high dose long-term NSAIDs due to his single kidney.  He does not like to take steroids due to his diabetes.  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  He states he monitors his blood sugar more frequently and sometimes has to supplement his metformin  therapy with insulin  during these times.  This would be an option for therapy if needed.   Following his SAVR he was able to manage his postop pain with 2 Tylenol  and 1 Advil TID and feels this regimen would be effective for him again.   The ED staff has recommended APAP TID - QID and Advil  200 mg 1-2x/day.  He is happy with this plan.  He has f/u appt with Dr. Rumalda 10/19 and pulmonologist Dr. McQuaide 10/28.  He currently feels well.     Allergies[1]  Medications:  Prior to Admission medications  Medication Sig Start Date End Date Taking? Authorizing Provider  acetaminophen  (TYLENOL ) 500 mg tablet Take 1,000 mg by mouth every morning.    HISTORICAL PROVIDER, CONVERSION  albuterol  2.5 mg /3 mL (0.083 %) nebulizer solution Take 2.5 mg by nebulization every 6 (six) hours as needed for shortness of breath or wheezing.    HISTORICAL PROVIDER, CONVERSION  albuterol  HFA (PROVENTIL  HFA;VENTOLIN  HFA;PROAIR  HFA) 90 mcg/actuation inhaler Inhale 1 puff every 4 (four) hours as needed for wheezing or shortness of breath. 06/25/23   HISTORICAL PROVIDER, CONVERSION  aspirin   81 mg chewable tablet Chew 1 tablet (81 mg total) daily. 12/30/23   Sari Cinnamon, FNP  atorvastatin  (LIPITOR ) 80 mg tablet Take 80 mg by mouth daily.    HISTORICAL PROVIDER, CONVERSION  Breztri  Aerosphere 160-9-4.8 mcg/actuation inhaler Inhale 2 puffs 2 (two) times a day. 05/26/24   Vicenta Thresa Lennert, MD  cetirizine (ZyrTEC) 10 mg tablet Take 10 mg by mouth 2 (two) times a day.    HISTORICAL PROVIDER, CONVERSION  EPINEPHrine  (EPIPEN ) 0.3 mg/0.3 mL injection syringe Inject 1 Syringe into the thigh once as needed for anaphylaxis.    HISTORICAL PROVIDER, CONVERSION  evolocumab (Repatha Syringe) 140 mg/mL syrg Inject 140 mg under the skin every 14 (fourteen) days.    HISTORICAL PROVIDER, CONVERSION  ezetimibe  (ZETIA ) 10 mg tablet Take 10 mg by mouth daily. 07/21/23   HISTORICAL PROVIDER, CONVERSION  famotidine  (PEPCID ) 20 mg tablet Take 20 mg by mouth every evening. 06/26/23   HISTORICAL PROVIDER, CONVERSION  fluticasone  propionate (FLONASE ) 50 mcg/spray nasal spray Administer 2 sprays into each nostril daily as needed for allergies.    HISTORICAL PROVIDER, CONVERSION  ipratropium-albuteroL  (DUO-NEB) 0.5-2.5 mg/3 mL nebulizer solution  Take 3 mL by nebulization every 6 (six) hours as needed for wheezing or shortness of breath.    HISTORICAL PROVIDER, CONVERSION  Jardiance  25 mg tab Take 25 mg by mouth daily. 06/24/23   HISTORICAL PROVIDER, CONVERSION  levothyroxine  (SYNTHROID ) 25 mcg tablet Take 25 mcg by mouth every morning.    HISTORICAL PROVIDER, CONVERSION  metFORMIN  (GLUCOPHAGE ) 1,000 mg tablet Take 1,000 mg by mouth in the morning and 1,000 mg in the evening. Take with meals.    HISTORICAL PROVIDER, CONVERSION  metoprolol  succinate (TOPROL  XL) 25 mg 24 hr tablet Take one tablet (25 mg total) by mouth daily. 02/05/24   Glendia Tanda Ee, MD  Mounjaro  10 mg/0.5 mL pnij Inject 10 mg under the skin every 7 days. 06/24/23   HISTORICAL PROVIDER, CONVERSION  OneTouch Verio test strips test strip Use to test BG as directed. 06/05/23   HISTORICAL PROVIDER, CONVERSION  tamsulosin  (FLOMAX ) 0.4 mg cap Take 0.4 mg by mouth daily.    HISTORICAL PROVIDER, CONVERSION    Medical History[2]  Surgical History[3]  Social History: Tobacco Use History[4] Social History   Substance and Sexual Activity  Alcohol  Use Yes  . Alcohol /week: 2.0 standard drinks of alcohol   . Types: 2 Shots of liquor per week   Social History   Substance and Sexual Activity  Drug Use Never    Family History[5]  Code Status: Full Code  Review of Systems:  A complete review of systems was obtained. Pertinent items are noted in HPI.  Physical Examination:  VITAL SIGNS: BP 123/85 (BP Location: Left arm, Patient Position: Sitting)   Pulse 67   Temp 97.5 F (36.4 C) (Oral)   Resp 18   Ht 1.778 m (5' 10)   Wt 105 kg (231 lb 14.8 oz)   SpO2 96%   BMI 33.28 kg/m   General:  Awake, alert, oriented, dressed in street clothes, sitting in chair at bedside, appears comfortable,  in NAD Skin:  Warm & dry Neuro:  Alert HEENT:  nl   Resp:  Resp even and nonlabored.  Lungs sounds clear throughout CV:  S1S2 regular rate and rhythm,  2/6 systolic ejection  murmur , No gallops or rubs Abd:  Soft, nontender, NABS Ext:  No cyanosis or edema MSK:  Moving all extremities.  No c/c/e. Neck:  No thyromegaly.  No JVD.  no carotid bruit(s) Pulses:  2+ and symmetrical upper and lower extremities.   Heme: No signs of bleeding or excessive bruising.  Objective Data Reviewed During this Patient Encounter:  Test Results ECG 9/24: sinus rhythm with left axis deviation and poor anterior R wave progression.  Is unchanged from last study.  Chest CTA done 06/17/2024: *  No acute aortic syndrome. *  New anterior pericardial thickening and trace pericardial effusion, highly suspicious for pericarditis. 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   Echo 06/17/2024: PROCEDURE  Image Quality  Technically difficult. A injection of Definity  contrast  agent was performed to  improve image quality.  The left ventricular size is normal.  There is normal left ventricular wall thickness.  LV ejection fraction = 60-65%.  The right ventricle is normal size.  The right ventricular systolic function is normal.  There is a bioprosthetic aortic valve.  The prosthetic aortic valve is well-seated with normal function  IVC size was normal.  There is no pericardial effusion.     Echocardiogram 04/13/2024:  1. Left ventricular ejection fraction, by estimation, is 70 to 75%. The left ventricle has hyperdynamic function. Left ventricular endocardial border not optimally defined to evaluate regional wall motion. Left ventricular diastolic parameters are  consistent with Grade I diastolic dysfunction (impaired relaxation).   2. Right ventricular systolic function is hyperdynamic. The right ventricular size is normal. Tricuspid regurgitation signal is inadequate for assessing PA pressure.   3. The mitral valve is grossly normal. No evidence of mitral valve regurgitation. No evidence of mitral stenosis.    4. The aortic valve has been repaired/replaced. Aortic valve regurgitation is not visualized. There is a 27 mm bovine valve present in the aortic position. Procedure Date: 12/24/23. Echo findings are consistent with normal structure and function of the aortic valve prosthesis. Aortic valve mean gradient measures 6.0 mmHg. Aortic valve Vmax measures 1.61 m/s.   5. Aortic root/ascending aorta has been repaired/replaced.   Comparison(s): Technically poor quality study. The aortic root and the aortic valve have been replaced since the previous echo 11/15/2023.   Right and left heart catheterization done 11/19/2023 showed normal coronary arteries and normal hemodynamics  Labs:  Results from last 7 days  Lab Units 06/17/24 1256 06/17/24 1114  TROPONIN HS ng/L 6 7   Results from last 7 days  Lab Units 06/18/24 0456  WHITE BLOOD CELL COUNT 10*3/uL 8.84  RED BLOOD CELL COUNT 10*6/uL 5.23  HEMOGLOBIN g/dL 85.0  HEMATOCRIT % 54.5  MEAN CORPUSCULAR VOLUME fL 86.9  MEAN CORPUSCULAR HEMOGLOBIN pg 28.4  MEAN CORPUSCULAR HEMOGLOBIN CONC g/dL 67.2*  RED CELL DISTRIBUTION WIDTH % 18.2*  PLATELET COUNT 10*3/uL 187  MEAN PLATELET VOLUME fL 8.5  . Results from last 7 days  Lab Units 06/18/24 0456 06/17/24 1114  SODIUM mmol/L 135* 136  POTASSIUM mmol/L 4.3 5.0*  CHLORIDE mmol/L 101 104  BUN mg/dL 19 18  CREATININE mg/dL 8.71 8.68*   Lab Results  Component Value Date   INR 1.0 12/24/2023   Lab Results  Component Value Date   CHOL 133 12/26/2023   TRIG 207 (H) 12/26/2023   HDL 46 (L) 12/26/2023   LDLCALC 60 12/26/2023       Portions of this note were dictated using DRAGON voice recognition software. Please disregard any errors in transcription.  This record has been created using Conservation officer, historic buildings. Errors have been sought and corrected, but may not  always be located. Such creation errors do not reflect on the standard of medical care.          [1] Allergies Allergen Reactions  . Bee Sting Anaphylaxis  . Cat Dander   . Hay Fever And Allergy Relief   [2] Past Medical History: Diagnosis Date  . BPH (benign prostatic hyperplasia)   . Chronic hypoxic respiratory failure, on home oxygen  therapy    (CMD)   . COPD (chronic obstructive pulmonary disease)    (CMD)   . DM (diabetes mellitus)    (CMD)   . GERD (gastroesophageal reflux disease)   . HLD (hyperlipidemia)   . HTN (hypertension)   . Hypothyroidism   . Renal cell carcinoma of left kidney    (CMD)   [3] Past Surgical History: Procedure Laterality Date  . AORTIC VALVE SURGERY N/A 12/24/2023   AORTIC VALVE REPLACEMENT USING INSPIRIS performed by [place] VEAR Rana, MD at [facility] OR  . ASCENDING AORTIC ANEURYSM REPAIR N/A 12/24/2023   ANEURYSM ASCENDING AORTIC REPAIR performed by [place] VEAR Rana, MD at [facility] OR  [4] Social History Tobacco Use  Smoking Status Never  Smokeless Tobacco Former  . Types: Snuff  . Quit date: 12/24/2023  [5] No family history on file.

## 2024-06-18 NOTE — ED Provider Notes (Signed)
 [facility] Emergency Department Provider Progress Note   ED Course 06/18/2024 10:52 AM   Patient was seen as a component of rounds today.  This is a shared visit with the providers on the CDU.  Labs Lab Results (last 24 hours)     Procedure Component Value Ref Range Date/Time   POC Glucose [[id number]]  (Abnormal) Collected: 06/18/24 0804   Lab Status: Final result Specimen: Blood from Capillary Updated: 06/18/24 0805    Glucose, POC 135* 70 - 99 mg/dL     Comment: Pre-Meal     CBC with Differential [[id number]]  (Abnormal) Collected: 06/18/24 0456   Lab Status: Final result Specimen: Blood from Venous Updated: 06/18/24 0519   Narrative:     The following orders were created for panel order CBC with Differential. Procedure                               Abnormality         Status                    ---------                               -----------         ------                    CBC with Differential[[id number]]       Abnormal            Final result               Please view results for these tests on the individual orders.   Basic Metabolic Panel [[id number]]  (Abnormal) Collected: 06/18/24 0456   Lab Status: Final result Specimen: Blood from Venous Updated: 06/18/24 0548    Sodium 135* 136 - 145 mmol/L     Potassium 4.3 3.4 - 4.5 mmol/L     Chloride 101 98 - 107 mmol/L     CO2 27 21 - 31 mmol/L     Anion Gap 7 6 - 14 mmol/L     Glucose, Random 119* 70 - 99 mg/dL     Blood Urea Nitrogen (BUN) 19 7 - 25 mg/dL     Creatinine 8.71 9.29 - 1.30 mg/dL     eGFR 65 >40 fO/fpw/8.26f7     Comment: GFR estimated by CKD-EPI equations(NKF 2021).   Recommend confirmation of Cr-based eGFR by using Cys-based eGFR and other filtration markers (if applicable) in complex cases and clinical decision-making, as needed.      Calcium  9.3 8.6 - 10.3 mg/dL     BUN/Creatinine Ratio --    Comment: Creatinine is normal, ratio is not clinically indicated.      CBC with Differential  [[id number]]  (Abnormal) Collected: 06/18/24 0456   Lab Status: Final result Specimen: Blood from Venous Updated: 06/18/24 0519    WBC 8.84 4.40 - 11.00 10*3/uL     RBC 5.23 4.50 - 5.90 10*6/uL     Hemoglobin 14.9 14.0 - 17.5 g/dL     Hematocrit 54.5 58.4 - 50.4 %     Mean Corpuscular Volume (MCV) 86.9 80.0 - 96.0 fL     Mean Corpuscular Hemoglobin (MCH) 28.4 27.5 - 33.2 pg     Mean Corpuscular Hemoglobin Conc (MCHC) 32.7* 33.0 - 37.0 g/dL     Red Cell  Distribution Width (RDW) 18.2* 12.3 - 17.0 %     Platelet Count (PLT) 187 150 - 450 10*3/uL     Mean Platelet Volume (MPV) 8.5 6.8 - 10.2 fL     Neutrophils % 74 %     Lymphocytes % 15 %     Monocytes % 10 %     Eosinophils % 1 %     Basophils % 1 %     Neutrophils Absolute 6.50 1.80 - 7.80 10*3/uL     Lymphocytes # 1.30 1.00 - 4.80 10*3/uL     Monocytes # 0.90* 0.00 - 0.80 10*3/uL     Eosinophils # 0.10 0.00 - 0.50 10*3/uL     Basophils # 0.00 0.00 - 0.20 10*3/uL    POC Glucose [[id number]]  (Abnormal) Collected: 06/17/24 1915   Lab Status: Final result Specimen: Blood from Capillary Updated: 06/17/24 1916    Glucose, POC 219* 70 - 99 mg/dL     Comment: Recheck/confirm     POC Glucose [[id number]]  (Abnormal) Collected: 06/17/24 1644   Lab Status: Final result Specimen: Blood from Capillary Updated: 06/17/24 1645    Glucose, POC 103* 70 - 99 mg/dL     Comment: Pre-Meal     Troponin, High Sensitive (2 Hr Rfx) [[id number]]  (Normal) Collected: 06/17/24 1256   Lab Status: Final result Specimen: Blood from Venous Updated: 06/17/24 1337    Troponin, High Sensitive 6 <20 ng/L     Comment: >= 20 ng/L INDICATES MYOCARDIAL DAMAGE.THE DIAGNOSIS OF MYOCARDIAL INFARCTION REQUIRES CLINICAL CORRELATION.   Elevated troponin may also be due to myocardial stress from a variety of causes.   Alkaline Phos (ALP) levels >400 U/L may cause falsely elevated results. Troponin test is invalid in patients taking asfotase alpha.   This troponin assay  was not validated for evaluation of troponin in patients younger than 21 years. There are no ranges established for patients younger than 21 years. Therefore, laboratory results for these patients should be  interpreted with caution.      CBC with Differential [[id number]]  (Abnormal) Collected: 06/17/24 1114   Lab Status: Final result Specimen: Blood from Venous Updated: 06/17/24 1124   Narrative:     The following orders were created for panel order CBC with Differential. Procedure                               Abnormality         Status                    ---------                               -----------         ------                    CBC with Differential[[id number]]       Abnormal            Final result               Please view results for these tests on the individual orders.   Comprehensive Metabolic Panel [[id number]]  (Abnormal) Collected: 06/17/24 1114   Lab Status: Final result Specimen: Blood from Venous Updated: 06/17/24 1150    Sodium 136 136 - 145 mmol/L     Potassium 5.0* 3.4 - 4.5 mmol/L  Chloride 104 98 - 107 mmol/L     CO2 22 21 - 31 mmol/L     Anion Gap 10 6 - 14 mmol/L     Glucose, Random 128* 70 - 99 mg/dL     Blood Urea Nitrogen (BUN) 18 7 - 25 mg/dL     Creatinine 8.68* 9.29 - 1.30 mg/dL     eGFR 63 >40 fO/fpw/8.26f7     Comment: GFR estimated by CKD-EPI equations(NKF 2021).   Recommend confirmation of Cr-based eGFR by using Cys-based eGFR and other filtration markers (if applicable) in complex cases and clinical decision-making, as needed.      Albumin 4.7 3.5 - 5.7 g/dL     Total Protein 7.3 6.4 - 8.9 g/dL     Bilirubin, Total 0.6 0.3 - 1.0 mg/dL     Alkaline Phosphatase (ALP) 74 34 - 104 U/L     Aspartate Aminotransferase (AST) 16 13 - 39 U/L     Alanine Aminotransferase (ALT) 15 7 - 52 U/L     Calcium  9.5 8.6 - 10.3 mg/dL     BUN/Creatinine Ratio 13.7 10.0 - 20.0    Troponin, High Sensitive (0 Hr + 2 Hr Rfx) [[id number]]  (Normal) Collected:  06/17/24 1114   Lab Status: Final result Specimen: Blood from Venous Updated: 06/17/24 1202    Troponin, High Sensitive 7 <20 ng/L     Comment: >= 20 ng/L INDICATES MYOCARDIAL DAMAGE.THE DIAGNOSIS OF MYOCARDIAL INFARCTION REQUIRES CLINICAL CORRELATION.   Elevated troponin may also be due to myocardial stress from a variety of causes.   Alkaline Phos (ALP) levels >400 U/L may cause falsely elevated results. Troponin test is invalid in patients taking asfotase alpha.   This troponin assay was not validated for evaluation of troponin in patients younger than 21 years. There are no ranges established for patients younger than 21 years. Therefore, laboratory results for these patients should be  interpreted with caution.      CBC with Differential [[id number]]  (Abnormal) Collected: 06/17/24 1114   Lab Status: Final result Specimen: Blood from Venous Updated: 06/17/24 1124    WBC 12.02* 4.40 - 11.00 10*3/uL     RBC 5.43 4.50 - 5.90 10*6/uL     Hemoglobin 15.0 14.0 - 17.5 g/dL     Hematocrit 53.0 58.4 - 50.4 %     Mean Corpuscular Volume (MCV) 86.3 80.0 - 96.0 fL     Mean Corpuscular Hemoglobin (MCH) 27.7 27.5 - 33.2 pg     Mean Corpuscular Hemoglobin Conc (MCHC) 32.1* 33.0 - 37.0 g/dL     Red Cell Distribution Width (RDW) 18.1* 12.3 - 17.0 %     Platelet Count (PLT) 225 150 - 450 10*3/uL     Mean Platelet Volume (MPV) 8.5 6.8 - 10.2 fL     Neutrophils % 79 %     Lymphocytes % 13 %     Monocytes % 7 %     Eosinophils % 1 %     Basophils % 0 %     Neutrophils Absolute 9.50* 1.80 - 7.80 10*3/uL     Lymphocytes # 1.50 1.00 - 4.80 10*3/uL     Monocytes # 0.80 0.00 - 0.80 10*3/uL     Eosinophils # 0.10 0.00 - 0.50 10*3/uL     Basophils # 0.00 0.00 - 0.20 10*3/uL        Radiology Radiology Results (last 72 hours)     Procedure Component Value Units Date/Time   CT Angio Chest [[id number]]  Collected: 06/17/24 1332   Order Status: Completed Updated: 06/17/24 1350   Narrative:     CT  ANGIO CHEST, 06/17/2024 1:22 PM  INDICATION: Acute aortic syndrome (AAS) suspected  COMPARISON: Radiograph done earlier today, CT 05/04/2024, 04/12/2024  TECHNIQUE: Precontrast axial images were obtained. Next, iodinated contrast was administered intravenously by rapid injection, with further multislice axial sections acquired in the arterial phase from the thoracic inlet to the upper abdomen. Coronal maximal intensity projection images were created for comprehensive analysis and diagnosis of the regional circulation.  All CT scans at [facility] and [facility] are performed using radiation dose optimization techniques as appropriate to a performed exam, including but not limited to one or more of the following: automatic exposure control, adjustment of the mA and/or kV according to patient size, use of iterative reconstruction technique. In addition, our institution participates in a radiation dose monitoring program to optimize patient radiation exposure.  FINDINGS:   Vascular:   Normal-sized aorta. Aorta and cervical branches are slightly tortuous. Mild atherosclerotic disease. No dissection or penetrating aortic ulcer. No intramural hematoma. Cervical branches, celiac trunk, SMA, bilateral renal arteries are patent and well-opacified.   Main pulmonary artery is not dilated. No incidental central pulmonary emboli.   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  Chest:   Thoracic inlet/central airways: Atrophic and hypoattenuating thyroid  gland.  Mediastinum/hila/axilla: Similar borderline enlarged mediastinal lymph nodes.  Heart: Cardiac chambers and normal in size. There is mild anterior pericardial thickening and trace pericardial effusion, new since prior. Status post aortic valve  replacement. Moderate coronary artery calcified lesions.  Lungs/pleura: Mild diffuse bronchial wall thickening. Few scattered calcified granulomas.  Upper abdomen: Hepatic steatosis.  Chest wall/MSK: Healing midline sternotomy.    Impression:     *  No acute aortic syndrome. *  New anterior pericardial thickening and trace pericardial effusion, highly suspicious for pericarditis. 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  Findings were comminuted to and acknowledged by Alm Ozell Collier MD via epic message system on 06/17/2024 at 1:45 PM.   XR Chest 1 View [[id number]] Collected: 06/17/24 1207   Order Status: Completed Updated: 06/17/24 1210   Narrative:     XR CHEST 1 VIEW, 06/17/2024 11:47 AM  INDICATION:Chest Pain  COMPARISON: 12/29/2023.  FINDINGS:   Supportive devices: None. Cardiovascular/lungs/pleura: Cardiac silhouette and pulmonary vasculature are within normal limits. Lungs are clear. No pleural effusion or pneumothorax.  Other: Unremarkable.    Impression:     There is no evidence of acute cardiac or pulmonary abnormality.         Assessment and Plan I have reviewed the triage vital signs and the nursing notes. Pertinent labs & imaging results that were available during my care of the patient were reviewed by me and considered in my medical decision making (see chart for details).   ED Clinical Impression 1. Chest pain, unspecified type    Patient is a 58 year old white male that presents to the ER secondary to discomfort.  Patient was seen on rounds today in CDU.  The patient has a fairly extensive history.  The patient has had a history of renal cancer with subsequent nephrectomy.  He also had a recent aortic valve replacement because of a bicuspid valve as well as an aortic  arch patient.  This was all completed in April of this year.  Patient is followed by Dr. Burnie at [facility].  Patient had chest pain yesterday and therefore presented to the ER for evaluation and management.  Workup at this point had a presentation that was felt to be possibly due to a pericarditis.  Given the patient's underlying history with a single kidney with some mild underlying renal dysfunction associated with that presentation.  Patient was admitted overnight for observation and management.  The patient agreed to push p.o. fluids as a component of his care.  Patient did not have any changes in his renal function following assessment and management.  The patient was also provided Toradol  overnight.  He also noted that he had excellent results from the Toradol .  Given the patient's compromising medical concerns, we have requested cardiology to weigh on on medications to use for the patient's chest discomfort moving forward.   The patient is currently in observation status in order to determine the necessity of potential hospitalization / admission.   Plan is for admission / discharge and to complete patient education and reassessment.   This document serves as a record of services personally performed by C. Royden Candle, MD  It was created on their behalf by Carlin Royden Candle Mickey., MD, a trained medical scribe. The creation of this record is the provider's dictation and/or activities during the visit.    *Some images could not be shown.

## 2024-06-22 DIAGNOSIS — I130 Hypertensive heart and chronic kidney disease with heart failure and stage 1 through stage 4 chronic kidney disease, or unspecified chronic kidney disease: Secondary | ICD-10-CM | POA: Diagnosis not present

## 2024-06-22 DIAGNOSIS — E1165 Type 2 diabetes mellitus with hyperglycemia: Secondary | ICD-10-CM | POA: Diagnosis not present

## 2024-07-06 DIAGNOSIS — R0902 Hypoxemia: Secondary | ICD-10-CM | POA: Diagnosis not present

## 2024-07-21 DIAGNOSIS — I350 Nonrheumatic aortic (valve) stenosis: Secondary | ICD-10-CM | POA: Diagnosis not present

## 2024-07-21 DIAGNOSIS — J432 Centrilobular emphysema: Secondary | ICD-10-CM | POA: Diagnosis not present

## 2024-07-21 DIAGNOSIS — C642 Malignant neoplasm of left kidney, except renal pelvis: Secondary | ICD-10-CM | POA: Diagnosis not present

## 2024-07-21 DIAGNOSIS — R911 Solitary pulmonary nodule: Secondary | ICD-10-CM | POA: Diagnosis not present

## 2024-08-05 ENCOUNTER — Telehealth: Payer: Self-pay

## 2024-08-05 NOTE — Telephone Encounter (Signed)
 PA renewal initiated automatically by CoverMyMeds.  Submitted a Prior Authorization request to [facility] for DUPIXENT  via CoverMyMeds. Will update once we receive a response.  Key: [id number]

## 2024-08-06 DIAGNOSIS — R0902 Hypoxemia: Secondary | ICD-10-CM | POA: Diagnosis not present

## 2024-08-07 ENCOUNTER — Encounter: Payer: Self-pay | Admitting: Internal Medicine

## 2024-08-07 ENCOUNTER — Other Ambulatory Visit (HOSPITAL_COMMUNITY): Payer: Self-pay

## 2024-08-07 NOTE — Telephone Encounter (Signed)
 Received notification from [facility] regarding a prior authorization for DUPIXENT . Authorization has been APPROVED from 08/06/24 to 08/06/25. Approval letter sent to scan center.  Unable to run test claim because pt must fill through [facility].  Authorization # [id number] Phone # [phone]

## 2024-08-12 DIAGNOSIS — I7121 Aneurysm of the ascending aorta, without rupture: Secondary | ICD-10-CM | POA: Diagnosis not present

## 2024-08-12 DIAGNOSIS — I350 Nonrheumatic aortic (valve) stenosis: Secondary | ICD-10-CM | POA: Diagnosis not present

## 2024-08-12 DIAGNOSIS — I300 Acute nonspecific idiopathic pericarditis: Secondary | ICD-10-CM | POA: Diagnosis not present

## 2024-08-30 ENCOUNTER — Other Ambulatory Visit: Payer: Self-pay | Admitting: Internal Medicine

## 2024-08-30 DIAGNOSIS — R053 Chronic cough: Secondary | ICD-10-CM

## 2024-09-15 DIAGNOSIS — E1165 Type 2 diabetes mellitus with hyperglycemia: Secondary | ICD-10-CM | POA: Diagnosis not present

## 2024-09-23 ENCOUNTER — Encounter: Payer: Self-pay | Admitting: Internal Medicine

## 2024-09-28 ENCOUNTER — Inpatient Hospital Stay

## 2024-09-28 ENCOUNTER — Encounter: Payer: Self-pay | Admitting: Internal Medicine

## 2024-09-29 ENCOUNTER — Encounter: Payer: Self-pay | Admitting: Internal Medicine

## 2024-09-30 ENCOUNTER — Ambulatory Visit (HOSPITAL_COMMUNITY)
Admission: RE | Admit: 2024-09-30 | Discharge: 2024-09-30 | Disposition: A | Discharge status: Home / Self Care | Source: Ambulatory Visit | Attending: Internal Medicine | Admitting: Internal Medicine

## 2024-09-30 ENCOUNTER — Inpatient Hospital Stay: Attending: Physician Assistant

## 2024-09-30 DIAGNOSIS — Z905 Acquired absence of kidney: Secondary | ICD-10-CM | POA: Diagnosis not present

## 2024-09-30 DIAGNOSIS — R918 Other nonspecific abnormal finding of lung field: Secondary | ICD-10-CM | POA: Diagnosis not present

## 2024-09-30 DIAGNOSIS — C642 Malignant neoplasm of left kidney, except renal pelvis: Secondary | ICD-10-CM | POA: Diagnosis present

## 2024-09-30 DIAGNOSIS — Z79899 Other long term (current) drug therapy: Secondary | ICD-10-CM | POA: Diagnosis not present

## 2024-09-30 LAB — CBC WITH DIFFERENTIAL (CANCER CENTER ONLY)
Abs Immature Granulocytes: 0 K/uL (ref 0.00–0.07)
Basophils Absolute: 0.1 K/uL (ref 0.0–0.1)
Basophils Relative: 1 %
Eosinophils Absolute: 0.1 K/uL (ref 0.0–0.5)
Eosinophils Relative: 2 %
HCT: 45.7 % (ref 39.0–52.0)
Hemoglobin: 15.5 g/dL (ref 13.0–17.0)
Immature Granulocytes: 0 %
Lymphocytes Relative: 31 %
Lymphs Abs: 2 K/uL (ref 0.7–4.0)
MCH: 29.6 pg (ref 26.0–34.0)
MCHC: 33.9 g/dL (ref 30.0–36.0)
MCV: 87.4 fL (ref 80.0–100.0)
Monocytes Absolute: 0.6 K/uL (ref 0.1–1.0)
Monocytes Relative: 9 %
Neutro Abs: 3.8 K/uL (ref 1.7–7.7)
Neutrophils Relative %: 57 %
Platelet Count: 182 K/uL (ref 150–400)
RBC: 5.23 MIL/uL (ref 4.22–5.81)
RDW: 13.2 % (ref 11.5–15.5)
WBC Count: 6.6 K/uL (ref 4.0–10.5)
nRBC: 0 % (ref 0.0–0.2)

## 2024-09-30 LAB — CMP (CANCER CENTER ONLY)
ALT: 23 U/L (ref 0–44)
AST: 22 U/L (ref 15–41)
Albumin: 4.2 g/dL (ref 3.5–5.0)
Alkaline Phosphatase: 80 U/L (ref 38–126)
Anion gap: 10 (ref 5–15)
BUN: 17 mg/dL (ref 6–20)
CO2: 26 mmol/L (ref 22–32)
Calcium: 9.1 mg/dL (ref 8.9–10.3)
Chloride: 105 mmol/L (ref 98–111)
Creatinine: 1.43 mg/dL — ABNORMAL HIGH (ref 0.61–1.24)
GFR, Estimated: 57 mL/min — ABNORMAL LOW
Glucose, Bld: 125 mg/dL — ABNORMAL HIGH (ref 70–99)
Potassium: 4.8 mmol/L (ref 3.5–5.1)
Sodium: 141 mmol/L (ref 135–145)
Total Bilirubin: 0.2 mg/dL (ref 0.0–1.2)
Total Protein: 6.6 g/dL (ref 6.5–8.1)

## 2024-09-30 LAB — LACTATE DEHYDROGENASE: LDH: 199 U/L (ref 105–235)

## 2024-09-30 NOTE — Progress Notes (Signed)
 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   PRIOR THERAPY: 1) Status post left laparoscopic radical nephrectomy on 03/12/2022 under the care of Dr. Carolee and the final pathology showed clear-cell renal cell carcinoma nuclear grade 4 measuring 7.0 cm with tumor extension into the renal vein and renal sinus. 2) Adjuvant immunotherapy with Keytruda  200 Mg IV every 3 weeks status post 17 cycles  CURRENT THERAPY: Observation   INTERVAL HISTORY: Russell Burns 59 y.o. male returns to the clinic today for a follow-up visit.  The patient was last seen in the clinic by Dr. Sherrod on 06/10/2024.  The patient is followed for his history of renal cell carcinoma.  The patient underwent nephrectomy 2023 completed 1 year of immunotherapy with Keytruda .  He tolerated it fairly well.  He is currently on observation.  At his last appointment his CT scan showed an enlarging right lung apex nodule.  A PET scan was performed.  The PET scan showed borderline activity.  Therefore, Dr. Sherrod recommended a repeat CT scan of the chest in 4 months for close monitoring.  If it continues to increase in size and we may refer the patient to pulmonary medicine for consideration of bronchoscopy and biopsy. The patient sees Dr. Alaine from pulmonary medicine at [facility].   Otherwise the patient denies any major changes in his health since he was last seen. He is a little nervous about his scan results.  He denies any fever, chills, night sweats, or unexplained weight loss.  Denies any nausea, vomiting, diarrhea, or constipation.  He denies any chest pain, cough, or hemoptysis. He denies changes in shortness of breath.  He denies any rashes.  Denies any unusual bone pain.  Denies any headache or visual changes.  Denies any unusual  abdominal pain or back pain.  Denies any urinary changes.  He follows closely with Dr. Carolee from [facility] urology once a year.  His most recent appointment was 6 months ago. He is wondering if he needs to continue He is here today for evaluation and to review his scan results.   MEDICAL HISTORY: Past Medical History:  Diagnosis Date   Allergy    Aortic stenosis    Arthritis    Blood transfusion without reported diagnosis    Cancer (HCC)    Chronic kidney disease    Diabetes mellitus without complication (HCC)    FH: thoracic aneurysm    Hyperlipidemia    Hypertension    Obesity     ALLERGIES:  is allergic to bee venom and cat dander.  MEDICATIONS:  Current Outpatient Medications  Medication Sig Dispense Refill   acetaminophen  (TYLENOL ) 500 MG tablet Take 1,000 mg by mouth in the morning.     albuterol  (PROVENTIL ) (2.5 MG/3ML) 0.083% nebulizer solution Take 3 mLs (2.5 mg total) by nebulization every 4 (four) hours as needed for wheezing or shortness of breath. 75 mL 5   albuterol  (VENTOLIN  HFA) 108 (90 Base) MCG/ACT inhaler Inhale 2 puffs into the lungs every 6 (six) hours as needed for wheezing or shortness of breath.     amLODipine  (NORVASC ) 10 MG tablet Take 10 mg by mouth daily.     aspirin  81 MG chewable tablet Chew 81 mg by mouth daily.     atorvastatin  (LIPITOR ) 80 MG tablet Take 80 mg by mouth daily.  benzonatate  (TESSALON ) 200 MG capsule Take 1 capsule (200 mg total) by mouth 3 (three) times daily as needed. (Patient taking differently: Take 200 mg by mouth daily as needed for cough.) 20 capsule 0   Budeson-Glycopyrrol-Formoterol  (BREZTRI  AEROSPHERE) 160-9-4.8 MCG/ACT AERO Inhale 2 puffs into the lungs 2 (two) times daily. Take 2 puffs first thing in am and then another 2 puffs about 12 hours later. (Patient taking differently: Inhale 2 puffs into the lungs 2 (two) times daily.) 10.7 g 11   EPINEPHrine  0.3 mg/0.3 mL IJ SOAJ injection Inject 0.3 mg into the muscle as needed  for anaphylaxis.     ezetimibe  (ZETIA ) 10 MG tablet Take 10 mg by mouth daily.     famotidine  (PEPCID ) 20 MG tablet Take 1 tablet (20 mg total) by mouth daily. 90 tablet 0   fluticasone  (FLONASE ) 50 MCG/ACT nasal spray Place 2 sprays into both nostrils in the morning and at bedtime.     guaiFENesin  (MUCINEX ) 600 MG 12 hr tablet Take 1,200 mg by mouth 2 (two) times daily.     ipratropium-albuterol  (DUONEB) 0.5-2.5 (3) MG/3ML SOLN Take 3 mLs by nebulization daily as needed (shortness of breath or wheezing).     JARDIANCE  25 MG TABS tablet Take 25 mg by mouth in the morning.     levothyroxine  (SYNTHROID ) 25 MCG tablet Take 1 tablet (25 mcg total) by mouth daily before breakfast. 30 tablet 2   losartan  (COZAAR ) 25 MG tablet Take 25 mg by mouth daily.     metFORMIN  (GLUCOPHAGE ) 1000 MG tablet Take 1 tablet (1,000 mg total) by mouth in the morning and at bedtime.     REPATHA 140 MG/ML SOSY Inject 140 mg into the skin every 14 (fourteen) days.     tamsulosin  (FLOMAX ) 0.4 MG CAPS capsule Take 1 capsule (0.4 mg total) by mouth daily. 14 capsule 0   tirzepatide  (MOUNJARO ) 10 MG/0.5ML Pen Inject 10 mg into the skin once a week. (Patient taking differently: Inject 10 mg into the skin every Friday.) 6 mL 3   No current facility-administered medications for this visit.    SURGICAL HISTORY:  Past Surgical History:  Procedure Laterality Date   CYSTOSCOPY WITH URETEROSCOPY AND STENT PLACEMENT Left 02/12/2022   Procedure: CYSTOSCOPY WITH LEFT RETROGRADE PYELOGRAM, DIAGNOSTIC URETEROSCOPY AND STENT PLACEMENT;  Surgeon: Russell Burns Sherwood JONETTA DOUGLAS, MD;  Location: [facility];  Service: Urology;  Laterality: Left;   LAPAROSCOPIC NEPHRECTOMY, HAND ASSISTED Left 03/12/2022   Procedure: LEFT HAND ASSISTED LAPAROSCOPIC NEPHRECTOMY;  Surgeon: Russell Burns Sherwood JONETTA DOUGLAS, MD;  Location: [facility];  Service: Urology;  Laterality: Left;  NEED 2.5 HRS FOR THIS CASE   RIGHT HEART CATH AND CORONARY ANGIOGRAPHY N/A 11/19/2023   Procedure: RIGHT HEART  CATH AND CORONARY ANGIOGRAPHY;  Surgeon: Russell Morene PARAS, MD;  Location: [facility];  Service: Cardiovascular;  Laterality: N/A;   TONSILLECTOMY     WISDOM TOOTH EXTRACTION      REVIEW OF SYSTEMS:   Review of Systems  Constitutional: Negative for appetite change, chills, fatigue, fever and unexpected weight change.  HENT:  Negative for mouth sores, nosebleeds, sore throat and trouble swallowing.   Eyes: Negative for eye problems and icterus.  Respiratory: Negative for cough, hemoptysis, shortness of breath and wheezing.   Cardiovascular: Negative for chest pain and leg swelling.  Gastrointestinal: Negative for abdominal pain, constipation, diarrhea, nausea and vomiting.  Genitourinary: Negative for bladder incontinence, difficulty urinating, dysuria, frequency and hematuria.   Musculoskeletal: Negative for back pain, gait  problem, neck pain and neck stiffness.  Skin: Negative for itching and rash.  Neurological: Negative for dizziness, extremity weakness, gait problem, headaches, light-headedness and seizures.  Hematological: Negative for adenopathy. Does not bruise/bleed easily.  Psychiatric/Behavioral: Negative for confusion, depression and sleep disturbance. The patient is not nervous/anxious.     PHYSICAL EXAMINATION:  There were no vitals taken for this visit.  ECOG PERFORMANCE STATUS: 1  Physical Exam  Constitutional: Oriented to person, place, and time and well-developed, well-nourished, and in no distress.  HENT:  Head: Normocephalic and atraumatic.  Mouth/Throat: Oropharynx is clear and moist. No oropharyngeal exudate.  Eyes: Conjunctivae are normal. Right eye exhibits no discharge. Left eye exhibits no discharge. No scleral icterus.  Neck: Normal range of motion. Neck supple.  Cardiovascular: Normal rate, regular rhythm, normal heart sounds and intact distal pulses.   Pulmonary/Chest: Effort normal and breath sounds normal. No respiratory distress. No wheezes.  No rales.  Abdominal: Soft. Bowel sounds are normal. Exhibits no distension and no mass. There is no tenderness.  Musculoskeletal: Normal range of motion. Exhibits no edema.  Lymphadenopathy:    No cervical adenopathy.  Neurological: Alert and oriented to person, place, and time. Exhibits normal muscle tone. Gait normal. Coordination normal.  Skin: Skin is warm and dry. No rash noted. Not diaphoretic. No erythema. No pallor.  Psychiatric: Mood, memory and judgment normal.  Vitals reviewed.  LABORATORY DATA: Lab Results  Component Value Date   WBC 11.4 (H) 05/04/2024   HGB 14.5 05/04/2024   HCT 45.1 05/04/2024   MCV 84.0 05/04/2024   PLT 205 05/04/2024      Chemistry      Component Value Date/Time   NA 137 05/04/2024 0842   NA 139 07/25/2022 0935   K 5.2 (H) 05/04/2024 0842   CL 102 05/04/2024 0842   CO2 29 05/04/2024 0842   BUN 22 (H) 05/04/2024 0842   BUN 23 07/25/2022 0935   CREATININE 1.24 05/04/2024 0842      Component Value Date/Time   CALCIUM  9.5 05/04/2024 0842   ALKPHOS 71 05/04/2024 0842   AST 14 (L) 05/04/2024 0842   ALT 20 05/04/2024 0842   BILITOT 0.4 05/04/2024 0842       RADIOGRAPHIC STUDIES:  No results found.   ASSESSMENT/PLAN:  This is a very pleasant 59 year old Caucasian male diagnosed with stage IIIa (T3a, N0, M0) clear-cell renal cell carcinoma. He does not have any evidence of metastatic disease. He was diagnosed in June 2023.   He is status post left laparoscopic radical nephrectomy on 03/12/2022 under the care of Dr. Carolee.  The final pathology showed clear cell renal cell carcinoma nuclear grade 4 measuring 7.0 cm with tumor extension into the renal vein and renal sinus.   The patient completed adjuvant immunotherapy with Keytruda  200 mg IV every 3 weeks. He is status post 17 cycles.  His last dose was given on 06/13/2023.  Dr. Sherrod is monitoring a lung nodule closely.  The patient was seen with Dr. Sherrod today.  Dr. Sherrod  personally and independently reviewed the scan and discussed results with the patient today.  The scan showed stable lung nodules.  Dr. Sherrod recommends continued close monitoring or consideration of referral to pulmonary medicine for biopsy.   The patient will continue on observation with close monitoring and CT CAP in 3 months. I ordered without contrast due to CKD.   We will recheck his TSH and T4 as we are managing his thyroid .   The patient  was advised to call immediately if she has any concerning symptoms in the interval. The patient voices understanding of current disease status and treatment options and is in agreement with the current care plan. All questions were answered. The patient knows to call the clinic with any problems, questions or concerns. We can certainly see the patient much sooner if necessary      No orders of the defined types were placed in this encounter.    Russell Simones L Myisha Pickerel, PA-C 09/30/2024  ADDENDUM: Hematology/Oncology Attending: I had a face-to-face encounter with the patient today.  I reviewed his record, lab, scan and recommended his care plan.  This is a very pleasant 58 years old white male with stage Burns clear-cell renal cell carcinoma diagnosed in June 2024 status post left laparoscopic radical nephrectomy followed by 1 year treatment with adjuvant immunotherapy with Keytruda  and currently on observation.  The patient has a right upper lobe lung nodule that we have been monitoring closely.  He had repeat CT scan of the chest performed recently.  I personally and independently reviewed the scan images and discussed the result and showed the images to the patient in comparison to the previous PET scan images and the dominant 0.8 x 0.7 cm medial right upper lobe pulmonary nodule is unchanged still concerning for malignancy.  I discussed with the patient several options including continuous monitoring and imaging studies versus referral to pulmonary  medicine for consideration of bronchoscopy and biopsy of this nodule.  The patient would like to continue on observation for now. Will see him back for follow-up in 3 months with repeat CT scan of the chest, abdomen and pelvis for restaging of his disease. The patient was advised to call immediately if he has any concerning symptoms in the interval. Disclaimer: This note was dictated with voice recognition software. Similar sounding words can inadvertently be transcribed and may be missed upon review. Russell Burns MARLA Sherrod, MD

## 2024-10-05 ENCOUNTER — Inpatient Hospital Stay

## 2024-10-05 ENCOUNTER — Inpatient Hospital Stay: Admitting: Physician Assistant

## 2024-10-05 VITALS — BP 134/72 | HR 84 | Temp 97.9°F | Resp 18 | Ht 70.0 in | Wt 245.0 lb

## 2024-10-05 DIAGNOSIS — E039 Hypothyroidism, unspecified: Secondary | ICD-10-CM

## 2024-10-05 DIAGNOSIS — C642 Malignant neoplasm of left kidney, except renal pelvis: Secondary | ICD-10-CM

## 2024-10-05 LAB — TSH: TSH: 4.46 u[IU]/mL (ref 0.350–4.500)

## 2024-10-06 ENCOUNTER — Other Ambulatory Visit: Payer: Self-pay

## 2024-10-06 LAB — T4: T4, Total: 5.8 ug/dL (ref 4.5–12.0)

## 2025-01-08 ENCOUNTER — Inpatient Hospital Stay

## 2025-01-14 ENCOUNTER — Inpatient Hospital Stay: Admitting: Internal Medicine
